# Patient Record
Sex: Male | Born: 1965 | Race: Black or African American | Hispanic: No | Marital: Single | Smoking: Never smoker
Health system: Southern US, Community
[De-identification: ages and names within clinical notes are randomized; demographics above are authoritative.]

## PROBLEM LIST (undated history)

## (undated) DIAGNOSIS — N529 Male erectile dysfunction, unspecified: Secondary | ICD-10-CM

## (undated) DIAGNOSIS — F32A Depression, unspecified: Secondary | ICD-10-CM

## (undated) DIAGNOSIS — G373 Acute transverse myelitis in demyelinating disease of central nervous system: Secondary | ICD-10-CM

## (undated) DIAGNOSIS — K592 Neurogenic bowel, not elsewhere classified: Secondary | ICD-10-CM

## (undated) DIAGNOSIS — F419 Anxiety disorder, unspecified: Secondary | ICD-10-CM

## (undated) DIAGNOSIS — K219 Gastro-esophageal reflux disease without esophagitis: Secondary | ICD-10-CM

## (undated) DIAGNOSIS — N319 Neuromuscular dysfunction of bladder, unspecified: Secondary | ICD-10-CM

## (undated) DIAGNOSIS — I10 Essential (primary) hypertension: Secondary | ICD-10-CM

## (undated) DIAGNOSIS — J45909 Unspecified asthma, uncomplicated: Secondary | ICD-10-CM

## (undated) DIAGNOSIS — G8220 Paraplegia, unspecified: Secondary | ICD-10-CM

## (undated) HISTORY — DX: Anxiety disorder, unspecified: F41.9

## (undated) HISTORY — DX: Essential (primary) hypertension: I10

---

## 2020-03-30 DIAGNOSIS — Z87828 Personal history of other (healed) physical injury and trauma: Secondary | ICD-10-CM

## 2020-03-30 HISTORY — DX: Personal history of other (healed) physical injury and trauma: Z87.828

## 2020-11-07 ENCOUNTER — Encounter: Payer: Self-pay | Admitting: Physical Medicine and Rehabilitation

## 2020-12-08 ENCOUNTER — Other Ambulatory Visit: Payer: Self-pay

## 2020-12-08 ENCOUNTER — Encounter: Payer: Self-pay | Admitting: Physical Medicine and Rehabilitation

## 2020-12-08 ENCOUNTER — Encounter
Payer: BC Managed Care – PPO | Attending: Physical Medicine and Rehabilitation | Admitting: Physical Medicine and Rehabilitation

## 2020-12-08 VITALS — BP 159/90 | HR 63 | Temp 99.0°F | Ht 70.0 in | Wt 176.0 lb

## 2020-12-08 DIAGNOSIS — G8929 Other chronic pain: Secondary | ICD-10-CM | POA: Insufficient documentation

## 2020-12-08 DIAGNOSIS — N319 Neuromuscular dysfunction of bladder, unspecified: Secondary | ICD-10-CM | POA: Insufficient documentation

## 2020-12-08 DIAGNOSIS — Z79891 Long term (current) use of opiate analgesic: Secondary | ICD-10-CM | POA: Diagnosis present

## 2020-12-08 DIAGNOSIS — G8220 Paraplegia, unspecified: Secondary | ICD-10-CM | POA: Diagnosis present

## 2020-12-08 DIAGNOSIS — Z5181 Encounter for therapeutic drug level monitoring: Secondary | ICD-10-CM | POA: Diagnosis present

## 2020-12-08 DIAGNOSIS — G894 Chronic pain syndrome: Secondary | ICD-10-CM | POA: Insufficient documentation

## 2020-12-08 DIAGNOSIS — Z993 Dependence on wheelchair: Secondary | ICD-10-CM | POA: Diagnosis present

## 2020-12-08 DIAGNOSIS — G373 Acute transverse myelitis in demyelinating disease of central nervous system: Secondary | ICD-10-CM | POA: Diagnosis present

## 2020-12-08 DIAGNOSIS — R252 Cramp and spasm: Secondary | ICD-10-CM | POA: Insufficient documentation

## 2020-12-08 DIAGNOSIS — M546 Pain in thoracic spine: Secondary | ICD-10-CM | POA: Diagnosis present

## 2020-12-08 DIAGNOSIS — K592 Neurogenic bowel, not elsewhere classified: Secondary | ICD-10-CM | POA: Insufficient documentation

## 2020-12-08 MED ORDER — TRAMADOL HCL 50 MG PO TABS: 50.0000 mg | ORAL_TABLET | Freq: Four times a day (QID) | ORAL | 5 refills | Status: DC | PRN

## 2020-12-08 MED ORDER — FLUOXETINE HCL 10 MG PO CAPS: 10.0000 mg | ORAL_CAPSULE | Freq: Every day | ORAL | 5 refills | Status: DC

## 2020-12-08 NOTE — Progress Notes (Signed)
Subjective:    Patient ID: Maurice Cruz, male    DOB: 07/08/1966, 55 y.o.   MRN: [id number]  HPI  Pt is a 55 yr old R handed male with hx of HTN who developed transverse myelitis d'xd in 7/21. With neurogenic bowel and bladder and spasticity- here for evaluation.  S/P steroid IV and IVIG- no plasmapheresis.    Does OK overall.  Just trying to get back mobile again.  The issue right now.  60-70% back to normal in LLE and RLE is 10% back.   Has had therapy- doing outpt PT at [facility], [place]   Has to pull RLE forward to walk- could walk ~ 30 ft max with RW- father is with him when walks.   First fall this past Monday- slid out of bed trying to get into w/c. RLE not stable to really put weight on. No pain except sore back-   Pain- a little soreness  In back-  Later on that evening after therapy- but not right after therapy.  Don't have SCI specialist, not sure if has Neuro rehab specialist- haven't done any e stim.   Pain can become excruciating if sits up for more than 60-90 minutes- Has more Oxy left than Tramadol.   Goes back to PCP Wednesday for pain meds.    Had Baclofen and Lexpro- 10 mg each- got light headed and nauseated.   Taking Baclofen 5 mg QHS- working OK.    Mood is a problem- in pt's estimation.  Due to putting pressure on him financially- hasn't been paid from disability since December.    Self cathing-  q5 hours-   BM- can go every few days- Saturday- and then again Thursday.   Has hemorrhoids as well. And has constipation Takes Lactulose 30 G 1x/day Fiber tabs Colace- 300 mg daily Miralax- doesn't help- takes prn  Dulcolax tabs help him go when needs to.  Soft- has form- has to sit there forever.   Social Hx:  Moved to  parents in [place], [place] - tri-level- split level- stair lift installed on both levels.  W/C- got from [employer]- last year. - Ki mobility Cushion made by [employer] mobility- custom made.  Has RW- can use some- in  rehab  Urologist Dr Shawn Route- [facility]- likes her.    Pain Inventory Average Pain 3 Pain Right Now 3 My pain is intermittent and aching  In the last 24 hours, has pain interfered with the following? General activity 0 Relation with others 0 Enjoyment of life 0 What TIME of day is your pain at its worst? varies Sleep (in general) Poor  Pain is worse with: unsure Pain improves with: medication Relief from Meds: 8  ability to climb steps?  no do you drive?  no use a wheelchair transfers alone  employed # of hrs/week 40 disabled: date disabled on short tern disablity since accident  bladder control problems bowel control problems numbness anxiety  New patient  New patient    Family History  Problem Relation Age of Onset  . Cancer Maternal Grandmother    Social History   Socioeconomic History  . Marital status: Single    Spouse name: Not on file  . Number of children: Not on file  . Years of education: Not on file  . Highest education level: Not on file  Occupational History  . Not on file  Tobacco Use  . Smoking status: Never Smoker  . Smokeless tobacco: Not on  file  Substance and Sexual Activity  . Alcohol use: Not Currently  . Drug use: Not on file  . Sexual activity: Not on file  Other Topics Concern  . Not on file  Social History Narrative  . Not on file   Social Determinants of Health   Financial Resource Strain: Not on file  Food Insecurity: Not on file  Transportation Needs: Not on file  Physical Activity: Not on file  Stress: Not on file  Social Connections: Not on file    Past Medical History:  Diagnosis Date  . Anxiety   . Hx of spinal cord injury 03/2020  . Hypertension    BP (!) 159/90   Pulse 63   Temp 99 F (37.2 C)   Ht '5\' 10"'$  (1.778 m)   Wt 176 lb (79.8 kg)   SpO2 98%   BMI 25.25 kg/m   Opioid Risk Score:   Fall Risk Score:  `1  Depression screen PHQ 2/9  Depression screen PHQ 2/9 12/08/2020  Decreased  Interest 3  Down, Depressed, Hopeless 3  PHQ - 2 Score 6  Altered sleeping 1  Tired, decreased energy 1  Change in appetite 2  Feeling bad or failure about yourself  3  Trouble concentrating 0  Moving slowly or fidgety/restless 0  Suicidal thoughts 0  PHQ-9 Score 13  Difficult doing work/chores Very difficult    Review of Systems  Gastrointestinal: Positive for nausea.  Neurological: Positive for numbness.  All other systems reviewed and are negative.      Objective:   Physical Exam  Awake, alert, appropriate, accompanied by father, in manual w/c, hasn't done pressure relief, NAD MS: UEs 5/5 in biceps, triceps, WE, grip and finger abd LEs: RLE- HF 1/5, KE 2/5, DF and PF 2/5 LLE- HF 4/5, KE 4/5, and DF/PF 5/5  Neuro: Decreased sensation at T10 and down- intact above T10 B/L MAS- of 2 in R knee/hip as well as R ankle MAS of 1 in LLE in same joints No clonus B/L      Assessment & Plan:     Pt is a 55 yr old R handed male with hx of HTN who developed transverse myelitis d'xd in 7/21. With neurogenic bowel and bladder and spasticity- here for evaluation.  S/P steroid IV and IVIG- no plasmapheresis.    1.  Prozac- 10 mg daily x 2 weeks, then 20 mg daily for mood.     2. Pt SHOULD be on disability- at this time- for at least another 12 months. He is paraplegic, and needs to focus on healing- and recovering from such a significant/serious injury in his life- has transverse myelitis, which is  a severe disability, defined by the American Disability Act- and not only is getting therapy mmultiple days, per week, but can only tolerate sitting in his w/c for 60-90 minutes a time, before having to get out due to pain and fatigue. Without being in the w/c, he is not able to get from point A to point B in any significant fashion- he needs the w/c to "walk" for him- The back pain associated with transverse myelitis is well documented, because many level of the Spinal cord are  affected and cause muscular weakness in the back as well as the LE's. So I expect, ONCE pt has finished PT and OT- and been able to progress, he MIGHT  Be able to return to work in another 12 months or so.  As a SCI PM&R physician,  I see this frequently, and this course is well documented for many patients.    3. Opiate contract and oral drug screen due to SCI.   4. Is going to meet Neurologist who specializes in [place], May 9th- keep this appointment.   5. Once drug screen is back, will prescribe Oxy as needed   6. Tramadol 50 mg q6 hours as needed-  If that's not quite enough, 100 mg 2x/day- as needed. Usually 7 days for first Rx- but hopefully will bypass because has had before.   7. Pretreat with pain meds before therapy- ~ 1 hours prior.   8.  D/c Miralax- Senokot -  1-4 tabs/day- take SENNA, not senokot-S- goal going every other day.  Might want to decrease  Colace ot 2 tabs/day. Suggest decreasing Fiber to 2 tabs/day.   9. Con't Magneisum - 1 250 mg daily-  If other options don't work, can increase Magnesium- max dose is 1200 mg/day.   10.  Baclofen 5-10 mg nightly for spasticity will NOT give daytime Baclofen right now due to sedation, constipation as well as making RLE weaker due to getting rid of tone.   11. Don't make meds changes on same day-  Do one change, then 2 days later.   12. Do pressure relief in w/c every 15-20 minutes- discussed mmHg 20 to keep capillaries open; 30 to close them by sitting, so needs to do q15-20 minutes to allow tissue to heal.   13. PT referral so can see an Neuro Rehab/SCI specialist here at [facility]. Will also ask for Estim as well as eval for AFO vs KAFO for R leg.   14. F/U in ~8 weeks- double appointment for now .   I spent a total of 1 hour on appointment- as detailed above.

## 2020-12-08 NOTE — Addendum Note (Signed)
Addended by: Jasmine December T on: 12/08/2020 12:06 PM   Modules accepted: Orders

## 2020-12-08 NOTE — Patient Instructions (Addendum)
Pt is a 55 yr old R handed male with hx of HTN who developed transverse myelitis d'xd in 7/21. With neurogenic bowel and bladder and spasticity- here for evaluation.  S/P steroid IV and IVIG- no plasmapheresis.    1.  Prozac- 10 mg daily x 2 weeks, then 20 mg daily for mood.     2. Pt SHOULD be on disability- at this time- for at least another 12 months. He is paraplegic, and needs to focus on healing- and recovering from such a significant/serious injury in his life- has transverse myelitis, which is  a severe disability, defined by the American Disability Act- and not only is getting therapy mmultiple days, per week, but can only tolerate sitting in his w/c for 60-90 minutes a time, before having to get out due to pain and fatigue. Without being in the w/c, he is not able to get from point A to point B in any significant fashion- he needs the w/c to "walk" for him- The back pain associated with transverse myelitis is well documented, because many level of the Spinal cord are affected and cause muscular weakness in the back as well as the LE's. So I expect, ONCE pt has finished PT and OT- and been able to progress, he MIGHT  Be able to return to work in another 12 months or so.  As a SCI PM&R physician, I see this frequently, and this course is well documented for many patients.    3. Opiate contract and oral drug screen due to SCI.   4. Is going to meet Neurologist who specializes in [place], May 9th- keep this appointment.   5. Once drug screen is back, will prescribe Oxy as needed   6. Tramadol 50 mg q6 hours as needed-  If that's not quite enough, 100 mg 2x/day- as needed. Usually 7 days for first Rx- but hopefully will bypass because has had before.   7. Pretreat with pain meds before therapy- ~ 1 hours prior.   8.  D/c Miralax- Senokot -  1-4 tabs/day- take SENNA, not senokot-S- goal going every other day.  Might want to decrease  Colace ot 2 tabs/day. Suggest decreasing Fiber to 2 tabs/day.    9. Con't Magneisum - 1 250 mg daily-  If other options don't work, can increase Magnesium- max dose is 1200 mg/day.   10.  Baclofen 5-10 mg nightly for spasticity will NOT give daytime Baclofen right now due to sedation, constipation as well as making RLE weaker due to getting rid of tone.   11. Don't make meds changes on same day-  Do one change, then 2 days later.   12. Do pressure relief in w/c every 15-20 minutes- discussed mmHg 20 to keep capillaries open; 30 to close them by sitting, so needs to do q15-20 minutes to allow tissue to heal.   13. PT referral so can see an Neuro Rehab/SCI specialist here at [facility]. Will also ask for Estim as well as eval for AFO vs KAFO for R leg.    14. F/U in ~8 weeks- double appointment for now .

## 2020-12-08 NOTE — Addendum Note (Signed)
Addended by: Jasmine December T on: 12/08/2020 12:04 PM   Modules accepted: Orders

## 2020-12-15 LAB — DRUG TOX MONITOR 1 W/CONF, ORAL FLD

## 2020-12-15 LAB — DRUG TOX ALC METAB W/CON, ORAL FLD: Alcohol Metabolite: NEGATIVE ng/mL (ref <25)

## 2020-12-18 ENCOUNTER — Other Ambulatory Visit: Payer: Self-pay

## 2020-12-18 ENCOUNTER — Ambulatory Visit: Payer: BC Managed Care – PPO | Attending: Physical Medicine and Rehabilitation

## 2020-12-18 ENCOUNTER — Telehealth: Payer: Self-pay | Admitting: *Deleted

## 2020-12-18 DIAGNOSIS — M6281 Muscle weakness (generalized): Secondary | ICD-10-CM | POA: Insufficient documentation

## 2020-12-18 DIAGNOSIS — R2681 Unsteadiness on feet: Secondary | ICD-10-CM | POA: Insufficient documentation

## 2020-12-18 DIAGNOSIS — G373 Acute transverse myelitis in demyelinating disease of central nervous system: Secondary | ICD-10-CM | POA: Diagnosis present

## 2020-12-18 DIAGNOSIS — R2689 Other abnormalities of gait and mobility: Secondary | ICD-10-CM | POA: Insufficient documentation

## 2020-12-18 NOTE — Telephone Encounter (Signed)
Oral swab drug screen on 12/08/20 was completely negative. Maurice Cruz reported he took oxycodone last before the test on 12/07/20, therefore metabolites should have been present. Unclear if completely negative due to failed swab or he had no medication/metabolite in his system. Urine test is always the gold standard.

## 2020-12-18 NOTE — Patient Instructions (Signed)
Access Code: [email or web address] URL: [email or web address] Date: 12/18/2020 Prepared by: Sharlynn Oliphant  Exercises Supine Heel Slide with Strap - 2 x daily - 7 x weekly - 3 sets - 10 reps Supine Bridge - 2 x daily - 7 x weekly - 3 sets - 10 reps Bent Knee Fallouts - 2 x daily - 7 x weekly - 3 sets - 10 reps Hook Lying Single Knee to Chest Stretch with Towel - 2 x daily - 7 x weekly - 3 sets - 10 reps

## 2020-12-19 NOTE — Therapy (Signed)
[facility] [address] [place], [place], [address] Phone: [phone]   Fax:  [phone]  Physical Therapy Evaluation  Patient Details  Name: Maurice Cruz MRN: [id number] Date of Birth: [birth year] Referring Provider (PT): Dr Courtney Heys   Encounter Date: 12/18/2020   PT End of Session - 12/19/20 1321    Visit Number 1    Number of Visits 17    Date for PT Re-Evaluation 02/13/21    Authorization Type BCBS    PT Start Time [id number]    PT Stop Time 1230    PT Time Calculation (min) 45 min    Equipment Utilized During Treatment Gait belt    Activity Tolerance Patient tolerated treatment well;No increased pain;Patient limited by fatigue    Behavior During Therapy [facility] for tasks assessed/performed           Past Medical History:  Diagnosis Date   Anxiety    Hx of spinal cord injury 03/2020   Hypertension     History reviewed. No pertinent surgical history.  There were no vitals filed for this visit.    Subjective Assessment - 12/18/20 1150    Subjective Began to notice symptoms in 11/21, had 2 week hospital stay followed by 1 mo. at [facility] in [place].  Was receiving OPPT at [facility] for LE strengthening however referring MD recommended this clinic, has since moved in with parents for physical as well as financial assistance and has obtained and installed a chair lift, denies LE pain but has a hx of low back pain worse with prolonged sitting    How long can you sit comfortably? 30 min    How long can you stand comfortably? <5 min    How long can you walk comfortably? <5 min    Currently in Pain? No/denies             12/18/20 0001  Assessment  Medical Diagnosis MS  Referring Provider (PT) Dr Courtney Heys  Onset Date/Surgical Date 08/20/21  Next MD Visit 02/02/21  Prior Therapy OPPT  Precautions  Precautions Fall  Balance Screen  Has the patient fallen in the past 6 months Yes  How many  times? 1  Has the patient had a decrease in activity level because of a fear of falling?  Yes  Is the patient reluctant to leave their home because of a fear of falling?  Yes  [place] Private residence  Living Arrangements Parent  Available Help at Discharge Family  Type of [place] Access Level entry  [place] Two level  Alternate Level Stairs-Number of Steps 16  Alternate Level Stairs-Rails Right  [place] Other (comment)  Additional Comments stair lift  Prior Function  Level of Independence Independent  Vocation On disability (Attemting to obtain disability)  Sensation  Light Touch Appears Intact  Strength  Right/Left Hip Right  Right Hip Flexion 2/5  Right Hip Extension 3/5  Right Hip ABduction 2+/5  Right Knee Flexion 2+/5  Right Knee Extension 3-/5  Right Ankle Dorsiflexion 3/5  Right Ankle Plantar Flexion 3/5  Bed Mobility  Bed Mobility Sit to Supine;Supine to Sit (uses LLE to scoop RLE)  Transfers  Transfers Sit to Supine  Comments scoops RLE onto bed with LLE                 Objective measurements completed on examination: See above findings.  PT Education - 12/18/20 1231    Education Details [id number]    Person(s) Educated Patient    Methods Explanation;Demonstration;Tactile cues;Handout            PT Short Term Goals - 12/18/20 1334      PT SHORT TERM GOAL #1   Title Patient to demo initial HEP back to PT w/o need of cuing    Baseline initial HEP issued today    Time 4    Period Weeks    Status New    Target Date 01/16/21      PT SHORT TERM GOAL #2   Title patine to able to demo stand pivot and STS transfers with S    Baseline able to perform stand pivot and STS transfers with CGA    Time 4    Period Weeks    Status New    Target Date 01/16/21      PT SHORT TERM GOAL #3   Title patient able to ambulate 344f aross level surfaces using RW and light CGA     Baseline 11105fwith RW across level surfaces with CGA and WC follow    Time 4    Period Weeks    Status New    Target Date 01/16/21      PT SHORT TERM GOAL #4   Title patient to demo I in bed mobility with focus on control of RLE    Baseline requires CGA to transition from sit/supine needing to scoop RLE onto bed using LLE    Time 4    Period Weeks    Status New    Target Date 01/16/21      PT SHORT TERM GOAL #5   Title Assess BERG and set appropriate goal    Baseline UTA due to time constraint    Time 4    Period Weeks    Status New    Target Date 01/16/21             PT Long Term Goals - 12/19/20 1353      PT LONG TERM GOAL #1   Title Assess progress towards BERG    Baseline UTA    Time 8    Period Weeks    Status New    Target Date 02/13/21      PT LONG TERM GOAL #2   Title Ambulate 50044fith LRAD across level and unlevel ground with S    Baseline 115f35fth RW across level ground with CGA    Time 8    Period Weeks    Status New    Target Date 02/13/21      PT LONG TERM GOAL #3   Title improve RLE strength throughout from 2 to 3/5 to 3+/5 to allow participatin in functional tasks    Baseline 2 to 3/5 RLE sterngth    Time 8    Period Weeks    Status New    Target Date 02/13/21      PT LONG TERM GOAL #4   Title Patient to demo I in all transfers    Baseline CGA stand pivot and STS transfers    Time 8    Period Weeks    Status New    Target Date 02/13/21      PT LONG TERM GOAL #5   Title patient will ambulate room to room distances with LRAd under S    Baseline 115ft57fh RW and CGA    Time 8  Period Weeks    Status New    Target Date 02/13/21                  Plan - 12/19/20 1323    Clinical Impression Statement patient presents with decresaed functional mobility most profound in RLE weakness following a dx of transverse myelitis, he requires assist for transfers and ambulation, once standing he is able to ambulate with RW and light  CGA, sensation to light touch is intact but weakness noted with R hipflexion and knee extension which markedly impairs his ambulation ability.  He is a good candidat for skilled PT at this time with a good potential to improve his mobility and independence.  Patient educated on Eval findings, POC and prognosis and is in agreement    Personal Factors and Comorbidities Comorbidity 1    Comorbidities disease process    Examination-Activity Limitations Bed Mobility;Locomotion Level;Transfers;Continence;Toileting    Examination-Participation Restrictions Driving    Stability/Clinical Decision Making Stable/Uncomplicated    Clinical Decision Making Low    Rehab Potential Good    PT Frequency 2x / week    PT Duration 8 weeks    PT Treatment/Interventions ADLs/Self Care Home Management;Aquatic Therapy;Electrical Stimulation;DME Instruction;Gait training;Stair training;Functional mobility training;Therapeutic activities;Therapeutic exercise;Balance training;Neuromuscular re-education;Manual techniques;Wheelchair mobility training;Orthotic Fit/Training;Patient/family education    PT Next Visit Plan f/u with HEP, gait, balance and transfer training    PT Home Exercise Plan [id number]    Recommended Other Services OT pending    Consulted and Agree with Plan of Care Patient           Patient will benefit from skilled therapeutic intervention in order to improve the following deficits and impairments:  Abnormal gait,Decreased range of motion,Difficulty walking,Decreased endurance,Decreased activity tolerance,Decreased balance,Decreased mobility,Decreased strength  Visit Diagnosis: Other abnormalities of gait and mobility  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HCC)  Muscle weakness (generalized)     Problem List Patient Active Problem List   Diagnosis Date Noted   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[place]) 12/08/2020   Neurogenic bladder 12/08/2020   Neurogenic bowel 12/08/2020   Paraplegia following spinal cord injury ([place])  12/08/2020   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 [place], [place], [address] Phone: [phone]   Fax:  [phone]  Name: Maurice Cruz MRN: [id number] Date of Birth: [birth year]

## 2020-12-25 ENCOUNTER — Ambulatory Visit: Payer: BC Managed Care – PPO

## 2020-12-25 ENCOUNTER — Other Ambulatory Visit: Payer: Self-pay

## 2020-12-25 DIAGNOSIS — R2689 Other abnormalities of gait and mobility: Secondary | ICD-10-CM | POA: Diagnosis not present

## 2020-12-25 DIAGNOSIS — M6281 Muscle weakness (generalized): Secondary | ICD-10-CM

## 2020-12-25 DIAGNOSIS — R2681 Unsteadiness on feet: Secondary | ICD-10-CM

## 2020-12-25 NOTE — Therapy (Signed)
[place] [address] Maurice Cruz [place], [place], [address] Phone: [phone]   Fax:  [phone]  Physical Therapy Treatment  Patient Details  Name: Maurice Cruz MRN: [id number] Date of Birth: Dec 29, 1965 Referring Provider (PT): Dr Maurice Cruz   Encounter Date: 12/25/2020   PT End of Session - 12/25/20 1407    Visit Number 2    Number of Visits 17    Date for PT Re-Evaluation 02/13/21    Authorization Type BCBS    PT Start Time 1315    PT Stop Time 1400    PT Time Calculation (min) 45 min    Equipment Utilized During Treatment Gait belt    Activity Tolerance Patient tolerated treatment well;No increased pain;Patient limited by fatigue    Behavior During Therapy [facility] for tasks assessed/performed           Past Medical History:  Diagnosis Date  . Anxiety   . Hx of spinal cord injury 03/2020  . Hypertension     History reviewed. No pertinent surgical history.  There were no vitals filed for this visit.   Subjective Assessment - 12/25/20 1405    Subjective No falls or med changes to report, has RW today    Pertinent History Began to notice symptoms in 11/21, had 2 week hospital stay followed by 1 mo. at [facility] in [place].  Was receiving OPPT at [facility] for LE strengthening however referring MD recommended this clinic, has since moved in with parents for physical as well as financial assistance and has obtained and installed a chair lift, denies LE pain but has a hx of low back pain worse with prolonged sitting    How long can you sit comfortably? 30 min    How long can you stand comfortably? <5 min    How long can you walk comfortably? <5 min    Currently in Pain? No/denies                             [facility] Adult PT Treatment/Exercise - 12/25/20 0001      Bed Mobility   Bed Mobility Supine to Sit;Sit to Supine    Supine to Sit Set up assist;Contact Guard/Touching assist    Sit to  Supine Set up assist;Contact Guard/Touching assist      Transfers   Transfers Sit to Stand    Comments 5x with UE support, 5x with OH reach      Ambulation/Gait   Ambulation/Gait Yes    Ambulation/Gait Assistance 4: Min guard;4: Min assist    Ambulation/Gait Assistance Details facilitated L WS to clear R foot    Ambulation Distance (Feet) 115 Feet    Assistive device Rolling walker    Gait Pattern Step-through pattern    Ambulation Surface Level;Indoor    Gait Comments adjusted walker height to patient, 2x172f ambulation      Lumbar Exercises: Seated   Other Seated Lumbar Exercises core exercises of chest press, OH flexion and chops, 2x10 with 2.2# ball      Lumbar Exercises: Supine   Bridge 10 reps    Other Supine Lumbar Exercises DKTC over physioball, 10x, LTR 10x ea.                    PT Short Term Goals - 12/18/20 1334      PT SHORT TERM GOAL #1   Title Patient to demo initial HEP back to  PT w/o need of cuing    Baseline initial HEP issued today    Time 4    Period Weeks    Status New    Target Date 01/16/21      PT SHORT TERM GOAL #2   Title patine to able to demo stand pivot and STS transfers with S    Baseline able to perform stand pivot and STS transfers with CGA    Time 4    Period Weeks    Status New    Target Date 01/16/21      PT SHORT TERM GOAL #3   Title patient able to ambulate 337f aross level surfaces using RW and light CGA    Baseline 1160fwith RW across level surfaces with CGA and WC follow    Time 4    Period Weeks    Status New    Target Date 01/16/21      PT SHORT TERM GOAL #4   Title patient to demo I in bed mobility with focus on control of RLE    Baseline requires CGA to transition from sit/supine needing to scoop RLE onto bed using LLE    Time 4    Period Weeks    Status New    Target Date 01/16/21      PT SHORT TERM GOAL #5   Title Assess BERG and set appropriate goal    Baseline UTA due to time constraint    Time  4    Period Weeks    Status New    Target Date 01/16/21             PT Long Term Goals - 12/19/20 1353      PT LONG TERM GOAL #1   Title Assess progress towards BERG    Baseline UTA    Time 8    Period Weeks    Status New    Target Date 02/13/21      PT LONG TERM GOAL #2   Title Ambulate 50055fith LRAD across level and unlevel ground with S    Baseline 115f52fth RW across level ground with CGA    Time 8    Period Weeks    Status New    Target Date 02/13/21      PT LONG TERM GOAL #3   Title improve RLE strength throughout from 2 to 3/5 to 3+/5 to allow participatin in functional tasks    Baseline 2 to 3/5 RLE sterngth    Time 8    Period Weeks    Status New    Target Date 02/13/21      PT LONG TERM GOAL #4   Title Patient to demo I in all transfers    Baseline CGA stand pivot and STS transfers    Time 8    Period Weeks    Status New    Target Date 02/13/21      PT LONG TERM GOAL #5   Title patient will ambulate room to room distances with LRAd under S    Baseline 115ft63fh RW and CGA    Time 8    Period Weeks    Status New    Target Date 02/13/21                 Plan - 12/25/20 1408    Clinical Impression Statement Focus of todays session was review of HEP and bed mobility, gait and transfer training, able to perform STS transfers  with close S and ambulate 267f with RW and CGA to faciltate L WS and R swing through, continues to have difficulty and discomfort flexing R knee due to spasm/tone    Personal Factors and Comorbidities Comorbidity 1    Comorbidities disease process    Examination-Activity Limitations Bed Mobility;Locomotion Level;Transfers;Continence;Toileting    Examination-Participation Restrictions Driving    Stability/Clinical Decision Making Stable/Uncomplicated    Rehab Potential Good    PT Frequency 2x / week    PT Duration 8 weeks    PT Treatment/Interventions ADLs/Self Care Home Management;Aquatic Therapy;Electrical  Stimulation;DME Instruction;Gait training;Stair training;Functional mobility training;Therapeutic activities;Therapeutic exercise;Balance training;Neuromuscular re-education;Manual techniques;Wheelchair mobility training;Orthotic Fit/Training;Patient/family education    PT Next Visit Plan gait, balance and transfer training, add tasks in // bars using compliant surfaces, extend gait distance    PT Home Exercise Plan [id number]   Consulted and Agree with Plan of Care Patient           Patient will benefit from skilled therapeutic intervention in order to improve the following deficits and impairments:  Abnormal gait,Decreased range of motion,Difficulty walking,Decreased endurance,Decreased activity tolerance,Decreased balance,Decreased mobility,Decreased strength  Visit Diagnosis: Unsteadiness on feet  Muscle weakness (generalized)     Problem List Patient Active Problem List   Diagnosis Date Noted  . 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 Neurogenic bladder 12/08/2020  . Neurogenic bowel 12/08/2020  . Paraplegia following spinal cord injury ([place] 12/08/2020  . Wheelchair dependence 12/08/2020  . Spasticity 12/08/2020  . Chronic bilateral thoracic back pain 12/08/2020    JLanice Shirts3/28/2022, 2:13 PM  [place] [address]Phone: [phone]  Fax:  [phone] Name: MRAMONA CLARIDAMRN: [id number] of Birth: 103-03-1966

## 2020-12-29 ENCOUNTER — Other Ambulatory Visit: Payer: Self-pay

## 2020-12-29 ENCOUNTER — Ambulatory Visit: Payer: BC Managed Care – PPO | Attending: Physical Medicine and Rehabilitation

## 2020-12-29 DIAGNOSIS — G373 Acute transverse myelitis in demyelinating disease of central nervous system: Secondary | ICD-10-CM | POA: Diagnosis present

## 2020-12-29 DIAGNOSIS — R2689 Other abnormalities of gait and mobility: Secondary | ICD-10-CM

## 2020-12-29 DIAGNOSIS — M6281 Muscle weakness (generalized): Secondary | ICD-10-CM

## 2020-12-29 DIAGNOSIS — R2681 Unsteadiness on feet: Secondary | ICD-10-CM | POA: Diagnosis present

## 2020-12-29 NOTE — Therapy (Signed)
[facility] [address] [place], [place], [address] Phone: [phone]   Fax:  [phone]  Physical Therapy Treatment  Patient Details  Name: Maurice Cruz MRN: [id number] Date of Birth: [birth year] Referring Provider (PT): Dr Courtney Heys   Encounter Date: 12/29/2020   PT End of Session - 12/29/20 1233    Visit Number 3    Number of Visits 17    Date for PT Re-Evaluation 02/13/21    Authorization Type BCBS    PT Start Time 1232    PT Stop Time 1312    PT Time Calculation (min) 40 min    Equipment Utilized During Treatment Gait belt    Activity Tolerance Patient tolerated treatment well;No increased pain;Patient limited by fatigue    Behavior During Therapy [facility] for tasks assessed/performed           Past Medical History:  Diagnosis Date  . Anxiety   . Hx of spinal cord injury 03/2020  . Hypertension     History reviewed. No pertinent surgical history.  There were no vitals filed for this visit.   Subjective Assessment - 12/29/20 1233    Subjective No falls or med changes to report, has RW today. Pt had a procedure done to remove something on colon on Wednesday and has note that it is safe to resume therapy.    Pertinent History Began to notice symptoms in 11/21, had 2 week hospital stay followed by 1 mo. at [facility] in [place].  Was receiving OPPT at [facility] for LE strengthening however referring MD recommended this clinic, has since moved in with parents for physical as well as financial assistance and has obtained and installed a chair lift, denies LE pain but has a hx of low back pain worse with prolonged sitting    How long can you sit comfortably? 30 min    How long can you stand comfortably? <5 min    How long can you walk comfortably? <5 min    Currently in Pain? No/denies                             [facility] Adult PT Treatment/Exercise - 12/29/20 1235      Transfers    Transfers Sit to Stand;Stand to Sit    Sit to Stand 5: Supervision    Sit to Stand Details Verbal cues for technique    Sit to Stand Details (indicate cue type and reason) Pt initially unsteady when rising if has been sitting awhile but improves with consecutive performance.    Stand to Sit 5: Supervision    Stand to Sit Details (indicate cue type and reason) Verbal cues for technique    Stand to Sit Details Pt cued to control descent      Ambulation/Gait   Ambulation/Gait Yes    Ambulation/Gait Assistance 4: Min guard    Ambulation/Gait Assistance Details PT facilitated at pelvis for weight shift and to try to get some anterior pelvic rotation on the right.    Ambulation Distance (Feet) 230 Feet    Assistive device Rolling walker    Gait Pattern Step-through pattern;Decreased hip/knee flexion - right;Decreased step length - right;Decreased step length - left;Decreased dorsiflexion - right;Poor foot clearance - right    Ambulation Surface Level;Indoor      Standardized Balance Assessment   Standardized Balance Assessment Oceanographer Test      [employer] Test   Sit  to Stand Able to stand  independently using hands    Standing Unsupported Able to stand safely 2 minutes    Sitting with Back Unsupported but Feet Supported on Floor or Stool Able to sit safely and securely 2 minutes    Stand to Sit Controls descent by using hands    Transfers Needs two people to assist of supervise to be safe   only able to do lateral scoot   Standing Unsupported with Eyes Closed Able to stand 10 seconds with supervision    Standing Ubsupported with Feet Together Able to place feet together independently and stand for 1 minute with supervision    From Standing, Reach Forward with Outstretched Arm Can reach forward >5 cm safely (2")    From Standing Position, Pick up Object from Floor Unable to try/needs assist to keep balance    From Standing Position, Turn to Look Behind Over each Shoulder Looks behind  one side only/other side shows less weight shift    Turn 360 Degrees Needs assistance while turning    Standing Unsupported, Alternately Place Feet on Step/Stool Needs assistance to keep from falling or unable to try   unable to lift right foot to touch step   Standing Unsupported, One Foot in [employer] balance while stepping or standing    Standing on One Leg Unable to try or needs assist to prevent fall    Total Score 25      Neuro Re-ed    Neuro Re-ed Details  Tall kneeling on mat with bench for UE support: weight shifting side to side x 10 with cues to bump right hip in to PT's hip, tall kneeling with alternating shoulder flexion x 10, mini-squats x 5 with tactile cues at hips. Pt was cued to try to keep tummy tight to support back some. Did have some discomfort in low back and could not get completely erect.                    PT Short Term Goals - 12/29/20 1548      PT SHORT TERM GOAL #1   Title Patient to demo initial HEP back to PT w/o need of cuing    Baseline initial HEP issued today    Time 4    Period Weeks    Status New    Target Date 01/16/21      PT SHORT TERM GOAL #2   Title patine to able to demo stand pivot and STS transfers with S    Baseline able to perform stand pivot and STS transfers with CGA    Time 4    Period Weeks    Status New    Target Date 01/16/21      PT SHORT TERM GOAL #3   Title patient able to ambulate 334f aross level surfaces using RW and light CGA    Baseline 1182fwith RW across level surfaces with CGA and WC follow    Time 4    Period Weeks    Status New    Target Date 01/16/21      PT SHORT TERM GOAL #4   Title patient to demo I in bed mobility with focus on control of RLE    Baseline requires CGA to transition from sit/supine needing to scoop RLE onto bed using LLE    Time 4    Period Weeks    Status New    Target Date 01/16/21      PT  SHORT TERM GOAL #5   Title Assess BERG and set appropriate goal    Baseline Berg  performed and LTG written    Time 4    Period Weeks    Status Achieved    Target Date 01/16/21             PT Long Term Goals - 12/29/20 1548      PT LONG TERM GOAL #1   Title Pt will increase Berg score from 25/56 to >30/56 for improved balance and decreased fall risk.    Baseline 12/29/20 25/56    Time 8    Period Weeks    Status New      PT LONG TERM GOAL #2   Title Ambulate 548f with LRAD across level and unlevel ground with S    Baseline 1128fwith RW across level ground with CGA    Time 8    Period Weeks    Status New      PT LONG TERM GOAL #3   Title improve RLE strength throughout from 2 to 3/5 to 3+/5 to allow participatin in functional tasks    Baseline 2 to 3/5 RLE sterngth    Time 8    Period Weeks    Status New      PT LONG TERM GOAL #4   Title Patient to demo I in all transfers    Baseline CGA stand pivot and STS transfers    Time 8    Period Weeks    Status New      PT LONG TERM GOAL #5   Title patient will ambulate room to room distances with LRAd under S    Baseline 11532fith RW and CGA    Time 8    Period Weeks    Status New                 Plan - 12/29/20 1549    Clinical Impression Statement Pt has limited step length on right with decreased hip flexion.Stayed fairly consistent throughout gait with PT providing tactile cues at pelvis to try to faciliate some right pelvic rotation. PT performed Berg with score of 25/56 indicating pt is high fall risk. Pt was challenged in tall kneeling position being unable to get completely erect posture showing some weakness in core as well.    Personal Factors and Comorbidities Comorbidity 1    Comorbidities disease process    Examination-Activity Limitations Bed Mobility;Locomotion Level;Transfers;Continence;Toileting    Examination-Participation Restrictions Driving    Stability/Clinical Decision Making Stable/Uncomplicated    Rehab Potential Good    PT Frequency 2x / week    PT Duration 8  weeks    PT Treatment/Interventions ADLs/Self Care Home Management;Aquatic Therapy;Electrical Stimulation;DME Instruction;Gait training;Stair training;Functional mobility training;Therapeutic activities;Therapeutic exercise;Balance training;Neuromuscular re-education;Manual techniques;Wheelchair mobility training;Orthotic Fit/Training;Patient/family education    PT Next Visit Plan gait, balance and transfer training, add tasks in // bars using compliant surfaces, extend gait distance. Continue work in tall kneeling possibly try some quadruped as well. I know pt has limited right hip flexion with gait but I did discuss having him bring sneakers to see if we could try AFO to see if that changed anything? If helped could possibly consider Bioness as well?    PT Home Exercise Plan [id number] Consulted and Agree with Plan of Care Patient           Patient will benefit from skilled therapeutic intervention in order to improve the following deficits and  impairments:  Abnormal gait,Decreased range of motion,Difficulty walking,Decreased endurance,Decreased activity tolerance,Decreased balance,Decreased mobility,Decreased strength  Visit Diagnosis: Other abnormalities of gait and mobility  Muscle weakness (generalized)     Problem List Patient Active Problem List   Diagnosis Date Noted  . 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 Neurogenic bladder 12/08/2020  . Neurogenic bowel 12/08/2020  . Paraplegia following spinal cord injury ([place]) 12/08/2020  . Wheelchair dependence 12/08/2020  . Spasticity 12/08/2020  . Chronic bilateral thoracic back pain 12/08/2020    Electa Sniff, PT, DPT, NCS 12/29/2020, 3:58 PM  [facility] [address] [place], [place], [address] Phone: [phone]   Fax:  [phone]  Name: Maurice Cruz MRN: [id number] Date of Birth: 08-May-1966

## 2021-01-01 ENCOUNTER — Ambulatory Visit: Payer: BC Managed Care – PPO

## 2021-01-01 ENCOUNTER — Other Ambulatory Visit: Payer: Self-pay

## 2021-01-01 DIAGNOSIS — R2689 Other abnormalities of gait and mobility: Secondary | ICD-10-CM | POA: Diagnosis not present

## 2021-01-01 DIAGNOSIS — R2681 Unsteadiness on feet: Secondary | ICD-10-CM

## 2021-01-01 DIAGNOSIS — M6281 Muscle weakness (generalized): Secondary | ICD-10-CM

## 2021-01-01 NOTE — Therapy (Signed)
[facility] [address] [place], [place], [address] Phone: [phone]   Fax:  [phone]  Physical Therapy Treatment  Patient Details  Name: Maurice Cruz MRN: [id number] Date of Birth: July 12, 1966 Referring Provider (PT): Dr Courtney Heys   Encounter Date: 01/01/2021   PT End of Session - 01/01/21 1701    Visit Number 4    Number of Visits 17    Date for PT Re-Evaluation 02/13/21    Authorization Type BCBS    PT Start Time 1230    PT Stop Time 1315    PT Time Calculation (min) 45 min    Equipment Utilized During Treatment Gait belt    Activity Tolerance Patient tolerated treatment well;No increased pain;Patient limited by fatigue    Behavior During Therapy [facility] for tasks assessed/performed           Past Medical History:  Diagnosis Date  . Anxiety   . Hx of spinal cord injury 03/2020  . Hypertension     No past surgical history on file.  There were no vitals filed for this visit.   Subjective Assessment - 01/01/21 1237    Subjective No falls or med changes to report, accompanied by father today.    Pertinent History Began to notice symptoms in 11/21, had 2 week hospital stay followed by 1 mo. at [facility] in [place].  Was receiving OPPT at [facility] for LE strengthening however referring MD recommended this clinic, has since moved in with parents for physical as well as financial assistance and has obtained and installed a chair lift, denies LE pain but has a hx of low back pain worse with prolonged sitting    How long can you sit comfortably? 30 min    How long can you stand comfortably? <5 min    How long can you walk comfortably? <5 min                             [facility] Adult PT Treatment/Exercise - 01/01/21 0001      Transfers   Transfers Sit to Stand    Sit to Stand 5: Supervision    Sit to Stand Details Tactile cues for weight shifting;Verbal cues for sequencing    Sit to  Stand Details (indicate cue type and reason) form improved with reps      Ambulation/Gait   Ambulation/Gait Yes    Ambulation/Gait Assistance 4: Min guard    Ambulation/Gait Assistance Details continued tactile facilitation to promote pelvic rotation as well as weight shifting    Ambulation Distance (Feet) 230 Feet    Assistive device Rolling walker    Gait Pattern Step-through pattern    Ambulation Surface Level;Indoor      Knee/Hip Exercises: Seated   Hamstring Curl Strengthening;Right;1 set;15 reps;Limitations    Hamstring Limitations heel slides over towel                  PT Education - 01/01/21 1659    Education Details added seated heel slides and abduction against yellow band resistance    Person(s) Educated Patient;Parent(s)    Methods Explanation;Demonstration;Tactile cues;Verbal cues;Handout    Comprehension Verbalized understanding;Returned demonstration;Need further instruction            PT Short Term Goals - 12/29/20 1548      PT SHORT TERM GOAL #1   Title Patient to demo initial HEP back to PT w/o need of cuing  Baseline initial HEP issued today    Time 4    Period Weeks    Status New    Target Date 01/16/21      PT SHORT TERM GOAL #2   Title patine to able to demo stand pivot and STS transfers with S    Baseline able to perform stand pivot and STS transfers with CGA    Time 4    Period Weeks    Status New    Target Date 01/16/21      PT SHORT TERM GOAL #3   Title patient able to ambulate 316f aross level surfaces using RW and light CGA    Baseline 1152fwith RW across level surfaces with CGA and WC follow    Time 4    Period Weeks    Status New    Target Date 01/16/21      PT SHORT TERM GOAL #4   Title patient to demo I in bed mobility with focus on control of RLE    Baseline requires CGA to transition from sit/supine needing to scoop RLE onto bed using LLE    Time 4    Period Weeks    Status New    Target Date 01/16/21      PT  SHORT TERM GOAL #5   Title Assess BERG and set appropriate goal    Baseline Berg performed and LTG written    Time 4    Period Weeks    Status Achieved    Target Date 01/16/21             PT Long Term Goals - 12/29/20 1548      PT LONG TERM GOAL #1   Title Pt will increase Berg score from 25/56 to >30/56 for improved balance and decreased fall risk.    Baseline 12/29/20 25/56    Time 8    Period Weeks    Status New      PT LONG TERM GOAL #2   Title Ambulate 50032fith LRAD across level and unlevel ground with S    Baseline 115f21fth RW across level ground with CGA    Time 8    Period Weeks    Status New      PT LONG TERM GOAL #3   Title improve RLE strength throughout from 2 to 3/5 to 3+/5 to allow participatin in functional tasks    Baseline 2 to 3/5 RLE sterngth    Time 8    Period Weeks    Status New      PT LONG TERM GOAL #4   Title Patient to demo I in all transfers    Baseline CGA stand pivot and STS transfers    Time 8    Period Weeks    Status New      PT LONG TERM GOAL #5   Title patient will ambulate room to room distances with LRAd under S    Baseline 115ft6fh RW and CGA    Time 8    Period Weeks    Status New                 Plan - 01/01/21 1803    Clinical Impression Statement Todays skilled session focused on continued gait training with emphasis on RLE swing through with faciltation of WS and pelvic rotations, RW height adjusted to correct posture, addedd strengthening of abd and hamstrings to assist in returning to normal gait pattern by reducing ton and  improving mobility    Personal Factors and Comorbidities Comorbidity 1    Comorbidities disease process    Examination-Activity Limitations Bed Mobility;Locomotion Level;Transfers;Continence;Toileting    Examination-Participation Restrictions Driving    Stability/Clinical Decision Making Stable/Uncomplicated    Rehab Potential Good    PT Frequency 2x / week    PT Duration 8 weeks     PT Treatment/Interventions ADLs/Self Care Home Management;Aquatic Therapy;Electrical Stimulation;DME Instruction;Gait training;Stair training;Functional mobility training;Therapeutic activities;Therapeutic exercise;Balance training;Neuromuscular re-education;Manual techniques;Wheelchair mobility training;Orthotic Fit/Training;Patient/family education    PT Next Visit Plan continue gait training and extending distance, tall kneeling as tolerated to focus on pelvic stability and mobility as well as improve flexibility, will consider bracing and Bioness if unable to correct swing through deficits    PT Home Exercise Plan [id number]    Consulted and Agree with Plan of Care Patient           Patient will benefit from skilled therapeutic intervention in order to improve the following deficits and impairments:  Abnormal gait,Decreased range of motion,Difficulty walking,Decreased endurance,Decreased activity tolerance,Decreased balance,Decreased mobility,Decreased strength  Visit Diagnosis: Unsteadiness on feet  Muscle weakness (generalized)     Problem List Patient Active Problem List   Diagnosis Date Noted  . 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 Neurogenic bladder 12/08/2020  . Neurogenic bowel 12/08/2020  . Paraplegia following spinal cord injury ([place]) 12/08/2020  . Wheelchair dependence 12/08/2020  . Spasticity 12/08/2020  . Chronic bilateral thoracic back pain 12/08/2020    Lanice Shirts PT 01/01/2021, 6:10 PM  [facility] [address] [place], [place], [address] Phone: [phone]   Fax:  [phone]  Name: Maurice Cruz MRN: [id number] Date of Birth: 01-30-1966

## 2021-01-01 NOTE — Patient Instructions (Signed)
Access Code: [email or web address] URL: [email or web address] Date: 01/01/2021 Prepared by: Sharlynn Oliphant  Exercises Supine Heel Slide with Strap - 2 x daily - 7 x weekly - 3 sets - 10 reps Supine Bridge - 2 x daily - 7 x weekly - 3 sets - 10 reps Bent Knee Fallouts - 2 x daily - 7 x weekly - 3 sets - 10 reps Hook Lying Single Knee to Chest Stretch with Towel - 2 x daily - 7 x weekly - 3 sets - 10 reps Seated Heel Slide - 2 x daily - 7 x weekly - 3 sets - 10 reps Seated Hip Abduction with Resistance - 2 x daily - 7 x weekly - 3 sets - 10 reps

## 2021-01-03 ENCOUNTER — Encounter: Payer: Self-pay | Admitting: Physical Therapy

## 2021-01-03 ENCOUNTER — Other Ambulatory Visit: Payer: Self-pay

## 2021-01-03 ENCOUNTER — Ambulatory Visit: Payer: BC Managed Care – PPO | Admitting: Physical Therapy

## 2021-01-03 DIAGNOSIS — R2689 Other abnormalities of gait and mobility: Secondary | ICD-10-CM | POA: Diagnosis not present

## 2021-01-03 DIAGNOSIS — D099 Carcinoma in situ, unspecified: Secondary | ICD-10-CM | POA: Insufficient documentation

## 2021-01-03 DIAGNOSIS — M6281 Muscle weakness (generalized): Secondary | ICD-10-CM

## 2021-01-03 NOTE — Therapy (Signed)
[facility] [address] [place], [place], [address] Phone: [phone]   Fax:  [phone]  Physical Therapy Treatment  Patient Details  Name: Maurice Cruz MRN: [id number] Date of Birth: 05/18/1966 Referring Provider (PT): Dr Courtney Heys   Encounter Date: 01/03/2021   PT End of Session - 01/03/21 1236    Visit Number 5    Number of Visits 17    Date for PT Re-Evaluation 02/13/21    Authorization Type BCBS    PT Start Time 1232    PT Stop Time 1315    PT Time Calculation (min) 43 min    Equipment Utilized During Treatment Gait belt    Activity Tolerance Patient tolerated treatment well;No increased pain;Patient limited by fatigue    Behavior During Therapy [facility] for tasks assessed/performed           Past Medical History:  Diagnosis Date  . Anxiety   . Hx of spinal cord injury 03/2020  . Hypertension     History reviewed. No pertinent surgical history.  There were no vitals filed for this visit.   Subjective Assessment - 01/03/21 1236    Subjective No new complaints. No falls or pain to report.    Patient is accompained by: Family member   dad   Pertinent History Began to notice symptoms in 11/21, had 2 week hospital stay followed by 1 mo. at [facility] in [place].  Was receiving OPPT at [facility] for LE strengthening however referring MD recommended this clinic, has since moved in with parents for physical as well as financial assistance and has obtained and installed a chair lift, denies LE pain but has a hx of low back pain worse with prolonged sitting    How long can you sit comfortably? 30 min    How long can you stand comfortably? <5 min    How long can you walk comfortably? <5 min    Currently in Pain? No/denies                 [facility] Adult PT Treatment/Exercise - 01/03/21 1237      Transfers   Transfers Sit to Stand;Stand to Sit    Sit to Stand 5: Supervision;With upper extremity  assist;From bed;From chair/3-in-1    Stand to Sit 5: Supervision;With upper extremity assist;To bed;To chair/3-in-1      Ambulation/Gait   Ambulation/Gait Yes    Ambulation/Gait Assistance 4: Min guard    Ambulation/Gait Assistance Details with 1st rep cues for increased knee.hip felxion for improved step length and for heel strike as well. added green band assist  for DF/crossed behind the knee/attached at the gait belt to assist with hip/knee flexion for  second gait rep with improved foot clearance, hip/knee flexion and step placement noted. Attempted to try posterior [place] walkon brace with gait however pt work shoes that had attached laces/tounge of shoe and opening to put foot into shoe was limited, therefore brace did not fit. Pt to bring different sneaker next session to try brace with gait.    Ambulation Distance (Feet) 115 Feet   x1, 230 x1   Assistive device Rolling walker    Gait Pattern Step-through pattern;Decreased stride length;Decreased step length - right;Decreased stance time - left;Decreased hip/knee flexion - right;Decreased dorsiflexion - right;Lateral hip instability;Decreased trunk rotation;Narrow base of support;Poor foot clearance - right    Ambulation Surface Level;Indoor      Knee/Hip Exercises: Supine   Short Arc Quad Sets AROM;Strengthening;2 sets;10  reps;Limitations;Both    Short Arc [employer] Limitations manual stabilization to patella with lateral shift with each rep to decrease pain, cues for slow and controlled movements    Heel Slides AAROM;Strengthening;Right;1 set;10 reps;Limitations    Heel Slides Limitations with foot on pillowcase for sliding foot on mat table, assist needed for full hip/knee flexion and controlled movements.    Bridges Limitations with yoga block squeeze to further engage VMO on right side, cues for increased hip lift from mat table and slow lowering back to mat table.    Bridges with Greig Right AROM;Strengthening;Both;2 sets;10  reps;Limitations                    PT Short Term Goals - 12/29/20 1548      PT SHORT TERM GOAL #1   Title Patient to demo initial HEP back to PT w/o need of cuing    Baseline initial HEP issued today    Time 4    Period Weeks    Status New    Target Date 01/16/21      PT SHORT TERM GOAL #2   Title patine to able to demo stand pivot and STS transfers with S    Baseline able to perform stand pivot and STS transfers with CGA    Time 4    Period Weeks    Status New    Target Date 01/16/21      PT SHORT TERM GOAL #3   Title patient able to ambulate 385f aross level surfaces using RW and light CGA    Baseline 1130fwith RW across level surfaces with CGA and WC follow    Time 4    Period Weeks    Status New    Target Date 01/16/21      PT SHORT TERM GOAL #4   Title patient to demo I in bed mobility with focus on control of RLE    Baseline requires CGA to transition from sit/supine needing to scoop RLE onto bed using LLE    Time 4    Period Weeks    Status New    Target Date 01/16/21      PT SHORT TERM GOAL #5   Title Assess BERG and set appropriate goal    Baseline Berg performed and LTG written    Time 4    Period Weeks    Status Achieved    Target Date 01/16/21             PT Long Term Goals - 12/29/20 1548      PT LONG TERM GOAL #1   Title Pt will increase Berg score from 25/56 to >30/56 for improved balance and decreased fall risk.    Baseline 12/29/20 25/56    Time 8    Period Weeks    Status New      PT LONG TERM GOAL #2   Title Ambulate 5005fith LRAD across level and unlevel ground with S    Baseline 115f2fth RW across level ground with CGA    Time 8    Period Weeks    Status New      PT LONG TERM GOAL #3   Title improve RLE strength throughout from 2 to 3/5 to 3+/5 to allow participatin in functional tasks    Baseline 2 to 3/5 RLE sterngth    Time 8    Period Weeks    Status New      PT LONG TERM  GOAL #4   Title Patient to demo  I in all transfers    Baseline CGA stand pivot and STS transfers    Time 8    Period Weeks    Status New      PT LONG TERM GOAL #5   Title patient will ambulate room to room distances with LRAd under S    Baseline 125f with RW and CGA    Time 8    Period Weeks    Status New                 Plan - 01/03/21 1236    Clinical Impression Statement Today's skilled session continued to focus on strengthening and gait training with RW. Attempted to use brace on right LE, unable to this session due to shoe design. No issues reported or noted with session. The pt is progressing toward goals and should benefit from continued PT to progress toward unmet goals.    Personal Factors and Comorbidities Comorbidity 1    Comorbidities disease process    Examination-Activity Limitations Bed Mobility;Locomotion Level;Transfers;Continence;Toileting    Examination-Participation Restrictions Driving    Stability/Clinical Decision Making Stable/Uncomplicated    Rehab Potential Good    PT Frequency 2x / week    PT Duration 8 weeks    PT Treatment/Interventions ADLs/Self Care Home Management;Aquatic Therapy;Electrical Stimulation;DME Instruction;Gait training;Stair training;Functional mobility training;Therapeutic activities;Therapeutic exercise;Balance training;Neuromuscular re-education;Manual techniques;Wheelchair mobility training;Orthotic Fit/Training;Patient/family education    PT Next Visit Plan continue gait training and extending distance, tall kneeling as tolerated to focus on pelvic stability and mobility as well as improve flexibility, will consider bracing and Bioness if unable to correct swing through deficits    PT Home Exercise Plan [id number]   Consulted and Agree with Plan of Care Patient           Patient will benefit from skilled therapeutic intervention in order to improve the following deficits and impairments:  Abnormal gait,Decreased range of motion,Difficulty walking,Decreased  endurance,Decreased activity tolerance,Decreased balance,Decreased mobility,Decreased strength  Visit Diagnosis: Muscle weakness (generalized)  Other abnormalities of gait and mobility     Problem List Patient Active Problem List   Diagnosis Date Noted  . 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 Neurogenic bladder 12/08/2020  . Neurogenic bowel 12/08/2020  . Paraplegia following spinal cord injury ([place] 12/08/2020  . Wheelchair dependence 12/08/2020  . Spasticity 12/08/2020  . Chronic bilateral thoracic back pain 12/08/2020    KWillow Ora PTA, CCura[facility]oughtonOutpatient Neuro RLa Palma Intercommunity Hospital[address] [place] [phone]/06/22, 10:19 PM   Name: MMARCELUS GARRISMRN: [id number] of Birth: [birth year]

## 2021-01-08 ENCOUNTER — Ambulatory Visit: Payer: BC Managed Care – PPO

## 2021-01-09 ENCOUNTER — Ambulatory Visit: Payer: BC Managed Care – PPO

## 2021-01-09 ENCOUNTER — Other Ambulatory Visit: Payer: Self-pay

## 2021-01-09 DIAGNOSIS — R2689 Other abnormalities of gait and mobility: Secondary | ICD-10-CM | POA: Diagnosis not present

## 2021-01-09 DIAGNOSIS — M6281 Muscle weakness (generalized): Secondary | ICD-10-CM

## 2021-01-09 DIAGNOSIS — G373 Acute transverse myelitis in demyelinating disease of central nervous system: Secondary | ICD-10-CM

## 2021-01-09 DIAGNOSIS — R2681 Unsteadiness on feet: Secondary | ICD-10-CM

## 2021-01-09 NOTE — Therapy (Signed)
[facility] [address] [place], [place], [address] Phone: [phone]   Fax:  [phone]  Physical Therapy Treatment  Patient Details  Name: Maurice Cruz MRN: [id number] Date of Birth: 09/20/1966 Referring Provider (PT): Dr Courtney Heys   Encounter Date: 01/09/2021   PT End of Session - 01/09/21 1332    Visit Number 6    Number of Visits 17    Date for PT Re-Evaluation 02/13/21    Authorization Type BCBS    PT Start Time 1315    PT Stop Time 1400    PT Time Calculation (min) 45 min    Equipment Utilized During Treatment Gait belt    Activity Tolerance Patient tolerated treatment well;No increased pain;Patient limited by fatigue    Behavior During Therapy [facility] for tasks assessed/performed           Past Medical History:  Diagnosis Date  . Anxiety   . Hx of spinal cord injury 03/2020  . Hypertension     History reviewed. No pertinent surgical history.  There were no vitals filed for this visit.   Subjective Assessment - 01/09/21 1322    Subjective No changes to report, no falls to report    Patient is accompained by: Family member   dad   Pertinent History Began to notice symptoms in 11/21, had 2 week hospital stay followed by 1 mo. at [facility] in [place].  Was receiving OPPT at [facility] for LE strengthening however referring MD recommended this clinic, has since moved in with parents for physical as well as financial assistance and has obtained and installed a chair lift, denies LE pain but has a hx of low back pain worse with prolonged sitting    How long can you sit comfortably? 30 min    How long can you stand comfortably? <5 min    How long can you walk comfortably? <5 min                             [facility] Adult PT Treatment/Exercise - 01/09/21 0001      Ambulation/Gait   Ambulation/Gait Yes    Ambulation/Gait Assistance 4: Min guard    Ambulation/Gait Assistance Details  with R AFO    Ambulation Distance (Feet) 115 Feet    Assistive device Rolling walker    Gait Pattern Step-through pattern    Ambulation Surface Level;Indoor      Knee/Hip Exercises: Seated   Hamstring Curl Strengthening;Right;2 sets;10 reps    Hamstring Limitations performed from edge of mat over towel on floor      Knee/Hip Exercises: Supine   Other Supine Knee/Hip Exercises hip IR/ER 2x10    Other Supine Knee/Hip Exercises supine in hooklie hip fallouts 2x10 against light manual resistance, SKTC R over small ball with AAROM                    PT Short Term Goals - 12/29/20 1548      PT SHORT TERM GOAL #1   Title Patient to demo initial HEP back to PT w/o need of cuing    Baseline initial HEP issued today    Time 4    Period Weeks    Status New    Target Date 01/16/21      PT SHORT TERM GOAL #2   Title patine to able to demo stand pivot and STS transfers with S  Baseline able to perform stand pivot and STS transfers with CGA    Time 4    Period Weeks    Status New    Target Date 01/16/21      PT SHORT TERM GOAL #3   Title patient able to ambulate 335f aross level surfaces using RW and light CGA    Baseline 1139fwith RW across level surfaces with CGA and WC follow    Time 4    Period Weeks    Status New    Target Date 01/16/21      PT SHORT TERM GOAL #4   Title patient to demo I in bed mobility with focus on control of RLE    Baseline requires CGA to transition from sit/supine needing to scoop RLE onto bed using LLE    Time 4    Period Weeks    Status New    Target Date 01/16/21      PT SHORT TERM GOAL #5   Title Assess BERG and set appropriate goal    Baseline Berg performed and LTG written    Time 4    Period Weeks    Status Achieved    Target Date 01/16/21             PT Long Term Goals - 12/29/20 1548      PT LONG TERM GOAL #1   Title Pt will increase Berg score from 25/56 to >30/56 for improved balance and decreased fall risk.     Baseline 12/29/20 25/56    Time 8    Period Weeks    Status New      PT LONG TERM GOAL #2   Title Ambulate 50037fith LRAD across level and unlevel ground with S    Baseline 115f42fth RW across level ground with CGA    Time 8    Period Weeks    Status New      PT LONG TERM GOAL #3   Title improve RLE strength throughout from 2 to 3/5 to 3+/5 to allow participatin in functional tasks    Baseline 2 to 3/5 RLE sterngth    Time 8    Period Weeks    Status New      PT LONG TERM GOAL #4   Title Patient to demo I in all transfers    Baseline CGA stand pivot and STS transfers    Time 8    Period Weeks    Status New      PT LONG TERM GOAL #5   Title patient will ambulate room to room distances with LRAd under S    Baseline 115ft46fh RW and CGA    Time 8    Period Weeks    Status New                 Plan - 01/09/21 1411    Clinical Impression Statement Todays skilled session consisted of gait training with R AFO as well as attempt to add stepping tasks and R hip/kne strengthening with focus on hamstrings and hip flexors.  No marked gains in ambulation while wearing brace as dysfunction lies in inability to flex R hip and swing LE fwd.  Added activities and tasks in supine to adress hip/knee strength deficits requiring manual stabilization for proper form.  R hamstring flexibility appears to be WFL. [facility] tasks in //bars but patient unable to flex R hip to place on AIrex    Personal Factors  and Comorbidities Comorbidity 1    Comorbidities disease process    Examination-Activity Limitations Bed Mobility;Locomotion Level;Transfers;Continence;Toileting    Examination-Participation Restrictions Driving    Stability/Clinical Decision Making Stable/Uncomplicated    Rehab Potential Good    PT Frequency 2x / week    PT Duration 8 weeks    PT Treatment/Interventions ADLs/Self Care Home Management;Aquatic Therapy;Electrical Stimulation;DME Instruction;Gait training;Stair  training;Functional mobility training;Therapeutic activities;Therapeutic exercise;Balance training;Neuromuscular re-education;Manual techniques;Wheelchair mobility training;Orthotic Fit/Training;Patient/family education    PT Next Visit Plan continue gait training and extending distance, tall kneeling as tolerated to focus on pelvic stability and mobility as well as improve flexibility, will consider bracing and Bioness if unable to correct swing through deficits, continue to facilitate R hip and knee flexion    PT Home Exercise Plan [id number]    Consulted and Agree with Plan of Care Patient           Patient will benefit from skilled therapeutic intervention in order to improve the following deficits and impairments:  Abnormal gait,Decreased range of motion,Difficulty walking,Decreased endurance,Decreased activity tolerance,Decreased balance,Decreased mobility,Decreased strength  Visit Diagnosis: Unsteadiness on feet  Muscle weakness (generalized)  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[place])     Problem List Patient Active Problem List   Diagnosis Date Noted  . 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 Neurogenic bladder 12/08/2020  . Neurogenic bowel 12/08/2020  . Paraplegia following spinal cord injury ([place]) 12/08/2020  . Wheelchair dependence 12/08/2020  . Spasticity 12/08/2020  . Chronic bilateral thoracic back pain 12/08/2020    Lanice Shirts 01/09/2021, 2:25 PM  [facility] [address] [place], [place], [address] Phone: [phone]   Fax:  [phone]  Name: REXALL MAYBANK MRN: [id number] Date of Birth: 1965/10/01

## 2021-01-10 ENCOUNTER — Ambulatory Visit: Payer: BC Managed Care – PPO | Admitting: Physical Therapy

## 2021-01-10 ENCOUNTER — Encounter: Payer: Self-pay | Admitting: Physical Therapy

## 2021-01-10 DIAGNOSIS — R2689 Other abnormalities of gait and mobility: Secondary | ICD-10-CM

## 2021-01-10 DIAGNOSIS — M6281 Muscle weakness (generalized): Secondary | ICD-10-CM

## 2021-01-10 DIAGNOSIS — R2681 Unsteadiness on feet: Secondary | ICD-10-CM

## 2021-01-11 NOTE — Therapy (Signed)
[facility] [address] [place], [place], [address] Phone: [phone]   Fax:  [phone]  Physical Therapy Treatment  Patient Details  Name: Maurice Cruz MRN: [id number] Date of Birth: [birth year] Referring Provider (PT): Dr Courtney Heys   Encounter Date: 01/10/2021   PT End of Session - 01/10/21 1237    Visit Number 7    Number of Visits 17    Date for PT Re-Evaluation 02/13/21    Authorization Type BCBS    PT Start Time [id number]    PT Stop Time 1315    PT Time Calculation (min) 40 min    Equipment Utilized During Treatment Gait belt    Activity Tolerance Patient tolerated treatment well;No increased pain;Patient limited by fatigue    Behavior During Therapy [facility] for tasks assessed/performed           Past Medical History:  Diagnosis Date  . Anxiety   . Hx of spinal cord injury 03/2020  . Hypertension     History reviewed. No pertinent surgical history.  There were no vitals filed for this visit.   Subjective Assessment - 01/10/21 1237    Subjective No new complaints. No falls or pain to report. Used walker to walk from car into house yesterday after therapy session.    Patient is accompained by: Family member   mom   Pertinent History Began to notice symptoms in 11/21, had 2 week hospital stay followed by 1 mo. at [facility] in [place].  Was receiving OPPT at [facility] for LE strengthening however referring MD recommended this clinic, has since moved in with parents for physical as well as financial assistance and has obtained and installed a chair lift, denies LE pain but has a hx of low back pain worse with prolonged sitting    How long can you sit comfortably? 30 min    How long can you stand comfortably? <5 min    How long can you walk comfortably? <5 min    Currently in Pain? No/denies              [facility] Adult PT Treatment/Exercise - 01/10/21 1238      Transfers   Transfers Sit to Stand;Stand  to [employer] Transfers    Sit to Stand 5: Supervision;With upper extremity assist;From bed;From chair/3-in-1    Stand to Sit 5: Supervision;With upper extremity assist;To bed;To chair/3-in-1    Stand Pivot Transfers 5: Supervision    Stand Pivot Transfer Details (indicate cue type and reason) with RW from wheelchair to/rom mat table      Ambulation/Gait   Ambulation/Gait Yes      Exercises   Exercises Other Exercises    Other Exercises  tall kneeling on mat table with hands on Kaye bench: alternating UE raises for 10 reps each side with assist at pelvis for posture/stability. Then had pt work on mini squats with tremors noted with return to tall kneeling in trunk/UE's, assist needed at times to initiate the movements. Then back in tall kneeling had pt work on moving right knee forward/backwards x 5 reps, then laterally out/back in for 5 reps. Min guard to min assist with rehab tech holding the bench steady.      Knee/Hip Exercises: Supine   Bridges Limitations with legs over red pball and arms at sides x 10 reps with assist to stabilize the ball.  Then had pt squeeze yoga block between thighs while bridging for 10 reps.    Knee  Flexion Limitations with feet on red pball- hamstring curls for 10 reps with assist to stabilize ball;     Other Supine Knee/Hip Exercises hip fall outs with red band resistance for 10 reps on right LE with cues for slow, controlled movements; in prone right hamstring curls for 5 reps with assist needed.    Other Supine Knee/Hip Exercises in hooklying at edge of mat table: with right knee flexed had pt lower foot to floor<>back up to mat for 10 reps with assist to maintain knee flexion and for movements of lowring/lifting LE.                PT Short Term Goals - 01/10/21 1238      PT SHORT TERM GOAL #1   Title Patient to demo initial HEP back to PT w/o need of cuing    Baseline initial HEP issued today    Time 4    Period Weeks    Status On-going     Target Date 01/16/21      PT SHORT TERM GOAL #2   Title patine to able to demo stand pivot and STS transfers with S    Baseline able to perform stand pivot and STS transfers with CGA    Time 4    Period Weeks    Status On-going    Target Date 01/16/21      PT SHORT TERM GOAL #3   Title patient able to ambulate 377f aross level surfaces using RW and light CGA    Baseline 1172fwith RW across level surfaces with CGA and WC follow    Time 4    Period Weeks    Status On-going    Target Date 01/16/21      PT SHORT TERM GOAL #4   Title patient to demo I in bed mobility with focus on control of RLE    Baseline requires CGA to transition from sit/supine needing to scoop RLE onto bed using LLE    Time 4    Period Weeks    Status On-going    Target Date 01/16/21      PT SHORT TERM GOAL #5   Title Assess BERG and set appropriate goal    Baseline Berg performed and LTG written    Time 4    Period Weeks    Status Achieved    Target Date 01/16/21             PT Long Term Goals - 12/29/20 1548      PT LONG TERM GOAL #1   Title Pt will increase Berg score from 25/56 to >30/56 for improved balance and decreased fall risk.    Baseline 12/29/20 25/56    Time 8    Period Weeks    Status New      PT LONG TERM GOAL #2   Title Ambulate 50058fith LRAD across level and unlevel ground with S    Baseline 115f22fth RW across level ground with CGA    Time 8    Period Weeks    Status New      PT LONG TERM GOAL #3   Title improve RLE strength throughout from 2 to 3/5 to 3+/5 to allow participatin in functional tasks    Baseline 2 to 3/5 RLE sterngth    Time 8    Period Weeks    Status New      PT LONG TERM GOAL #4   Title Patient to demo I  in all transfers    Baseline CGA stand pivot and STS transfers    Time 8    Period Weeks    Status New      PT LONG TERM GOAL #5   Title patient will ambulate room to room distances with LRAd under S    Baseline 161f with RW and CGA     Time 8    Period Weeks    Status New                 Plan - 01/10/21 1238    Clinical Impression Statement Today's skilled session continued to focus on strengthening and muscle re-ed with no issues other than fatigue reported. The pt is making steady progress toward goals and should benefit from continued PT to progress toward unmet goals.   Personal Factors and Comorbidities Comorbidity 1    Comorbidities disease process    Examination-Activity Limitations Bed Mobility;Locomotion Level;Transfers;Continence;Toileting    Examination-Participation Restrictions Driving    Stability/Clinical Decision Making Stable/Uncomplicated    Rehab Potential Good    PT Frequency 2x / week    PT Duration 8 weeks    PT Treatment/Interventions ADLs/Self Care Home Management;Aquatic Therapy;Electrical Stimulation;DME Instruction;Gait training;Stair training;Functional mobility training;Therapeutic activities;Therapeutic exercise;Balance training;Neuromuscular re-education;Manual techniques;Wheelchair mobility training;Orthotic Fit/Training;Patient/family education    PT Next Visit Plan continue gait training and extending distance, tall kneeling as tolerated to focus on pelvic stability and mobility as well as improve flexibility, will consider bracing and Bioness if unable to correct swing through deficits, continue to facilitate R hip and knee flexion    PT Home Exercise Plan [id number]   Consulted and Agree with Plan of Care Patient           Patient will benefit from skilled therapeutic intervention in order to improve the following deficits and impairments:  Abnormal gait,Decreased range of motion,Difficulty walking,Decreased endurance,Decreased activity tolerance,Decreased balance,Decreased mobility,Decreased strength  Visit Diagnosis: Unsteadiness on feet  Muscle weakness (generalized)  Other abnormalities of gait and mobility     Problem List Patient Active Problem List   Diagnosis  Date Noted  . 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 12/08[place]  . Neurogenic bladder 12/08/2020  . Neurogenic bowel 12/08/2020  . Paraplegia [place] ([place] 12/08/[place]  . Wheelchair dependence 12/08/2020  . Spasticity 12/08/2020  . Chronic bilateral thoracic back pain 12/08/2020   KWillow Ora PTA, [facility] [address] [place]essemerGBeatt[place] [phone]/14/22, 9:23 PM   Name: Maurice KIMBERLINGMRN: [id number] of Birth: 1Jul 11, 1967

## 2021-01-15 ENCOUNTER — Ambulatory Visit: Payer: BC Managed Care – PPO

## 2021-01-15 ENCOUNTER — Other Ambulatory Visit: Payer: Self-pay

## 2021-01-15 DIAGNOSIS — R2689 Other abnormalities of gait and mobility: Secondary | ICD-10-CM

## 2021-01-15 DIAGNOSIS — M6281 Muscle weakness (generalized): Secondary | ICD-10-CM

## 2021-01-15 DIAGNOSIS — R2681 Unsteadiness on feet: Secondary | ICD-10-CM

## 2021-01-15 NOTE — Therapy (Signed)
[facility] [address] [place], [place], [address] Phone: [phone]   Fax:  [phone]  Physical Therapy Treatment  Patient Details  Name: Maurice Cruz MRN: [id number] Date of Birth: 08/14/1966 Referring Provider (PT): Dr Courtney Heys   Encounter Date: 01/15/2021   PT End of Session - 01/15/21 1652    Visit Number 8    Number of Visits 17    Date for PT Re-Evaluation 02/13/21    Authorization Type BCBS    PT Start Time 1230    PT Stop Time 1315    PT Time Calculation (min) 45 min    Equipment Utilized During Treatment Gait belt    Activity Tolerance Patient tolerated treatment well;No increased pain;Patient limited by fatigue    Behavior During Therapy [facility] for tasks assessed/performed           Past Medical History:  Diagnosis Date  . Anxiety   . Hx of spinal cord injury 03/2020  . Hypertension     History reviewed. No pertinent surgical history.  There were no vitals filed for this visit.   Subjective Assessment - 01/15/21 1248    Subjective No pain, falls or med changes to note    Patient is accompained by: Family member   mom   Pertinent History Began to notice symptoms in 11/21, had 2 week hospital stay followed by 1 mo. at [facility] in [place].  Was receiving OPPT at [facility] for LE strengthening however referring MD recommended this clinic, has since moved in with parents for physical as well as financial assistance and has obtained and installed a chair lift, denies LE pain but has a hx of low back pain worse with prolonged sitting    How long can you sit comfortably? 30 min    How long can you stand comfortably? <5 min    How long can you walk comfortably? <5 min                             [facility] Adult PT Treatment/Exercise - 01/15/21 0001      Transfers   Transfers Sit to Stand    Sit to Stand 5: Supervision;4: Min guard    Stand to Sit 5: Supervision    Stand  Pivot Transfers 5: Supervision    Stand Pivot Transfer Details (indicate cue type and reason) from [facility] to mat and back only setup needed      Ambulation/Gait   Ambulation/Gait Yes    Ambulation/Gait Assistance 4: Min guard    Ambulation Distance (Feet) 230 Feet    Assistive device Rolling walker    Gait Pattern Step-through pattern;Decreased step length - right;Decreased step length - left    Ambulation Surface Level;Indoor      Lumbar Exercises: Supine   Other Supine Lumbar Exercises LTR in hooklie    Other Supine Lumbar Exercises DKTC over red physioball with 1.1# ball squeeze, 2x10 with manual assist      Lumbar Exercises: Quadruped   Madcat/Old Horse 10 reps;Limitations    Madcat/Old Horse Limitations manual facilitation    Other Quadruped Lumbar Exercises sitting on heels, 1x10 with manual facilitation    Other Quadruped Lumbar Exercises crawling 5 steps ea. LE      Knee/Hip Exercises: Supine   Bridges Strengthening;Both;2 sets;10 reps    Bridges Limitations with 1.1# ball squeeze  PT Short Term Goals - 01/10/21 1238      PT SHORT TERM GOAL #1   Title Patient to demo initial HEP back to PT w/o need of cuing    Baseline initial HEP issued today    Time 4    Period Weeks    Status On-going    Target Date 01/16/21      PT SHORT TERM GOAL #2   Title patine to able to demo stand pivot and STS transfers with S    Baseline able to perform stand pivot and STS transfers with CGA    Time 4    Period Weeks    Status On-going    Target Date 01/16/21      PT SHORT TERM GOAL #3   Title patient able to ambulate 362f aross level surfaces using RW and light CGA    Baseline 1141fwith RW across level surfaces with CGA and WC follow    Time 4    Period Weeks    Status On-going    Target Date 01/16/21      PT SHORT TERM GOAL #4   Title patient to demo I in bed mobility with focus on control of RLE    Baseline requires CGA to transition from  sit/supine needing to scoop RLE onto bed using LLE    Time 4    Period Weeks    Status On-going    Target Date 01/16/21      PT SHORT TERM GOAL #5   Title Assess BERG and set appropriate goal    Baseline Berg performed and LTG written    Time 4    Period Weeks    Status Achieved    Target Date 01/16/21             PT Long Term Goals - 12/29/20 1548      PT LONG TERM GOAL #1   Title Pt will increase Berg score from 25/56 to >30/56 for improved balance and decreased fall risk.    Baseline 12/29/20 25/56    Time 8    Period Weeks    Status New      PT LONG TERM GOAL #2   Title Ambulate 50032fith LRAD across level and unlevel ground with S    Baseline 115f71fth RW across level ground with CGA    Time 8    Period Weeks    Status New      PT LONG TERM GOAL #3   Title improve RLE strength throughout from 2 to 3/5 to 3+/5 to allow participatin in functional tasks    Baseline 2 to 3/5 RLE sterngth    Time 8    Period Weeks    Status New      PT LONG TERM GOAL #4   Title Patient to demo I in all transfers    Baseline CGA stand pivot and STS transfers    Time 8    Period Weeks    Status New      PT LONG TERM GOAL #5   Title patient will ambulate room to room distances with LRAd under S    Baseline 115ft66fh RW and CGA    Time 8    Period Weeks    Status New                 Plan - 01/15/21 1653    Clinical Impression Statement Todays skilled sssion focused on gait training as well as  exercises in quadriped to facilitate R hip strength and function, ambulation distance limited by fatigue and absence of funcitonal R hip flexor strength    Personal Factors and Comorbidities Comorbidity 1    Comorbidities disease process    Examination-Activity Limitations Bed Mobility;Locomotion Level;Transfers;Continence;Toileting    Examination-Participation Restrictions Driving    Stability/Clinical Decision Making Stable/Uncomplicated    Rehab Potential Good    PT  Frequency 2x / week    PT Duration 8 weeks    PT Treatment/Interventions ADLs/Self Care Home Management;Aquatic Therapy;Electrical Stimulation;DME Instruction;Gait training;Stair training;Functional mobility training;Therapeutic activities;Therapeutic exercise;Balance training;Neuromuscular re-education;Manual techniques;Wheelchair mobility training;Orthotic Fit/Training;Patient/family education    PT Next Visit Plan continue gait training and extending distance, tall kneeling/quadriped as tolerated to focus on pelvic stability and mobility as well as improve flexibility, Bioness if unable to correct swing through deficits, continue to facilitate R hip and knee flexion    PT Home Exercise Plan [id number]    Consulted and Agree with Plan of Care Patient           Patient will benefit from skilled therapeutic intervention in order to improve the following deficits and impairments:  Abnormal gait,Decreased range of motion,Difficulty walking,Decreased endurance,Decreased activity tolerance,Decreased balance,Decreased mobility,Decreased strength  Visit Diagnosis: Unsteadiness on feet  Muscle weakness (generalized)  Other abnormalities of gait and mobility     Problem List Patient Active Problem List   Diagnosis Date Noted  . 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 Neurogenic bladder 12/08/2020  . Neurogenic bowel 12/08/2020  . Paraplegia following spinal cord injury ([place]) 12/08/2020  . Wheelchair dependence 12/08/2020  . Spasticity 12/08/2020  . Chronic bilateral thoracic back pain 12/08/2020    Lanice Shirts 01/15/2021, 4:57 PM  [place] [place] [address] [place], [place], [address] Phone: [phone]   Fax:  [phone]  Name: Maurice Cruz MRN: [id number] Date of Birth: August 22, 1966

## 2021-01-17 ENCOUNTER — Other Ambulatory Visit: Payer: Self-pay

## 2021-01-17 ENCOUNTER — Ambulatory Visit: Payer: BC Managed Care – PPO

## 2021-01-17 DIAGNOSIS — R2689 Other abnormalities of gait and mobility: Secondary | ICD-10-CM | POA: Diagnosis not present

## 2021-01-17 DIAGNOSIS — G373 Acute transverse myelitis in demyelinating disease of central nervous system: Secondary | ICD-10-CM

## 2021-01-17 DIAGNOSIS — R2681 Unsteadiness on feet: Secondary | ICD-10-CM

## 2021-01-17 DIAGNOSIS — M6281 Muscle weakness (generalized): Secondary | ICD-10-CM

## 2021-01-17 NOTE — Therapy (Signed)
[facility] [address] [place], [place], [address] Phone: [phone]   Fax:  [phone]  Physical Therapy Treatment  Patient Details  Name: Maurice Cruz MRN: [id number] Date of Birth: 1965/11/30 Referring Provider (PT): Dr Courtney Heys   Encounter Date: 01/17/2021   PT End of Session - 01/17/21 1237    Visit Number 9    Number of Visits 17    Date for PT Re-Evaluation 02/13/21    Authorization Type BCBS    PT Start Time 1230    PT Stop Time 1315    PT Time Calculation (min) 45 min    Equipment Utilized During Treatment Gait belt    Activity Tolerance Patient tolerated treatment well;No increased pain;Patient limited by fatigue    Behavior During Therapy [facility] for tasks assessed/performed           Past Medical History:  Diagnosis Date  . Anxiety   . Hx of spinal cord injury 03/2020  . Hypertension     History reviewed. No pertinent surgical history.  There were no vitals filed for this visit.   Subjective Assessment - 01/17/21 1237    Subjective No pain, falls or med changes to note, no issues following last sessions activities    Patient is accompained by: Family member   mom   Pertinent History Began to notice symptoms in 11/21, had 2 week hospital stay followed by 1 mo. at [facility] in [place].  Was receiving OPPT at [facility] for LE strengthening however referring MD recommended this clinic, has since moved in with parents for physical as well as financial assistance and has obtained and installed a chair lift, denies LE pain but has a hx of low back pain worse with prolonged sitting    How long can you sit comfortably? 30 min    How long can you stand comfortably? <5 min    How long can you walk comfortably? <5 min    Currently in Pain? No/denies                             [facility] Adult PT Treatment/Exercise - 01/17/21 0001      Transfers   Transfers Sit to Stand    Sit to  Stand 5: Supervision;4: Min guard    Stand to Sit 5: Supervision      Ambulation/Gait   Ambulation/Gait Yes    Ambulation/Gait Assistance 4: Min guard    Ambulation Distance (Feet) 50 Feet    Assistive device Rolling walker    Gait Pattern Step-through pattern    Ambulation Surface Level;Indoor      Lumbar Exercises: Supine   Bridge with Ball Squeeze Non-compliant;10 reps;Limitations    Bridge with [employer] Limitations 2x10 with 1.1# ball    Other Supine Lumbar Exercises hooklie fallouts with 1.1# ball squeeze 2x10    Other Supine Lumbar Exercises DKTC over red ball with TCs to RLE to assist as needed      Lumbar Exercises: Quadruped   Madcat/Old Horse 10 reps;Limitations    Madcat/Old Horse Limitations TCs needed    Other Quadruped Lumbar Exercises rocking onto heels 10x    Other Quadruped Lumbar Exercises Tall kneel with al UE flexion      Knee/Hip Exercises: Aerobic   Nustep L1 8' arms 12                  PT Education - 01/17/21 1356  Education Details discussed need to spend time in prone lying to stertch anterior hips    Person(s) Educated Patient;Parent(s)    Methods Explanation;Demonstration    Comprehension Verbalized understanding            PT Short Term Goals - 01/10/21 1238      PT SHORT TERM GOAL #1   Title Patient to demo initial HEP back to PT w/o need of cuing    Baseline initial HEP issued today    Time 4    Period Weeks    Status On-going    Target Date 01/16/21      PT SHORT TERM GOAL #2   Title patine to able to demo stand pivot and STS transfers with S    Baseline able to perform stand pivot and STS transfers with CGA    Time 4    Period Weeks    Status On-going    Target Date 01/16/21      PT SHORT TERM GOAL #3   Title patient able to ambulate 332f aross level surfaces using RW and light CGA    Baseline 1147fwith RW across level surfaces with CGA and WC follow    Time 4    Period Weeks    Status On-going    Target  Date 01/16/21      PT SHORT TERM GOAL #4   Title patient to demo I in bed mobility with focus on control of RLE    Baseline requires CGA to transition from sit/supine needing to scoop RLE onto bed using LLE    Time 4    Period Weeks    Status On-going    Target Date 01/16/21      PT SHORT TERM GOAL #5   Title Assess BERG and set appropriate goal    Baseline Berg performed and LTG written    Time 4    Period Weeks    Status Achieved    Target Date 01/16/21             PT Long Term Goals - 12/29/20 1548      PT LONG TERM GOAL #1   Title Pt will increase Berg score from 25/56 to >30/56 for improved balance and decreased fall risk.    Baseline 12/29/20 25/56    Time 8    Period Weeks    Status New      PT LONG TERM GOAL #2   Title Ambulate 50029fith LRAD across level and unlevel ground with S    Baseline 115f64fth RW across level ground with CGA    Time 8    Period Weeks    Status New      PT LONG TERM GOAL #3   Title improve RLE strength throughout from 2 to 3/5 to 3+/5 to allow participatin in functional tasks    Baseline 2 to 3/5 RLE sterngth    Time 8    Period Weeks    Status New      PT LONG TERM GOAL #4   Title Patient to demo I in all transfers    Baseline CGA stand pivot and STS transfers    Time 8    Period Weeks    Status New      PT LONG TERM GOAL #5   Title patient will ambulate room to room distances with LRAd under S    Baseline 115ft33fh RW and CGA    Time 8    Period  Weeks    Status New                 Plan - 01/17/21 1238    Clinical Impression Statement todays skilled session consisted of initiating Nustep and working in quadriped to improve R hip sterngth and stability.  Patient able to complete Nustep session w/o incident, progressed to tall kneeling with OH flexion alt. UEs, difficulty noted attaing tall kneel due to soft tissue restrictions to anterior hips.  Improved ability to swing RLE fwd through swing phase observed     Personal Factors and Comorbidities Comorbidity 1    Comorbidities disease process    Examination-Activity Limitations Bed Mobility;Locomotion Level;Transfers;Continence;Toileting    Examination-Participation Restrictions Driving    Stability/Clinical Decision Making Stable/Uncomplicated    Rehab Potential Good    PT Frequency 2x / week    PT Duration 8 weeks    PT Treatment/Interventions ADLs/Self Care Home Management;Aquatic Therapy;Electrical Stimulation;DME Instruction;Gait training;Stair training;Functional mobility training;Therapeutic activities;Therapeutic exercise;Balance training;Neuromuscular re-education;Manual techniques;Wheelchair mobility training;Orthotic Fit/Training;Patient/family education    PT Next Visit Plan continue to encourage activity to facilitatate R hip flexion in varied positions, supine, quadriped, sidelie    PT Home Exercise Plan [id number]    Consulted and Agree with Plan of Care Patient           Patient will benefit from skilled therapeutic intervention in order to improve the following deficits and impairments:  Abnormal gait,Decreased range of motion,Difficulty walking,Decreased endurance,Decreased activity tolerance,Decreased balance,Decreased mobility,Decreased strength  Visit Diagnosis: No diagnosis found.     Problem List Patient Active Problem List   Diagnosis Date Noted  . 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 Neurogenic bladder 12/08/2020  . Neurogenic bowel 12/08/2020  . Paraplegia following spinal cord injury ([place]) 12/08/2020  . Wheelchair dependence 12/08/2020  . Spasticity 12/08/2020  . Chronic bilateral thoracic back pain 12/08/2020    Lanice Shirts PT 01/17/2021, 2:27 PM  [facility] [address] [place], [place], [address] Phone: [phone]   Fax:  [phone]  Name: Maurice Cruz MRN: [id number] Date of Birth: Jun 22, 1966

## 2021-01-22 ENCOUNTER — Other Ambulatory Visit: Payer: Self-pay

## 2021-01-22 ENCOUNTER — Ambulatory Visit: Payer: BC Managed Care – PPO

## 2021-01-22 DIAGNOSIS — R2681 Unsteadiness on feet: Secondary | ICD-10-CM

## 2021-01-22 DIAGNOSIS — M6281 Muscle weakness (generalized): Secondary | ICD-10-CM

## 2021-01-22 DIAGNOSIS — R2689 Other abnormalities of gait and mobility: Secondary | ICD-10-CM

## 2021-01-22 DIAGNOSIS — G373 Acute transverse myelitis in demyelinating disease of central nervous system: Secondary | ICD-10-CM

## 2021-01-22 NOTE — Patient Instructions (Signed)
Access Code: [email or web address] URL: [email or web address] Date: 01/22/2021 Prepared by: Sharlynn Oliphant  Exercises Supine Bridge - 2 x daily - 7 x weekly - 3 sets - 10 reps Hook Lying Single Knee to Chest Stretch with Towel - 2 x daily - 7 x weekly - 3 sets - 10 reps Seated Heel Slide - 2 x daily - 7 x weekly - 3 sets - 10 reps Sidelying Bent Knee Hip Flexion - 2 x daily - 7 x weekly - 2 sets - 10 reps Supine Single Bent Knee Fallout - 2 x daily - 7 x weekly - 2 sets - 10 reps Clamshell - 2 x daily - 7 x weekly - 2 sets - 10 reps

## 2021-01-22 NOTE — Therapy (Signed)
[facility] [address] [place], [place], [address] Phone: [phone]   Fax:  [phone]  Physical Therapy Treatment  Patient Details  Name: Maurice Cruz MRN: [id number] Date of Birth: March 19, 1966 Referring Provider (PT): Dr Courtney Heys   Encounter Date: 01/22/2021   PT End of Session - 01/22/21 1424    Visit Number 10    Number of Visits 17    Date for PT Re-Evaluation 02/13/21    Authorization Type BCBS    PT Start Time 1315    PT Stop Time 1400    PT Time Calculation (min) 45 min    Equipment Utilized During Treatment Gait belt    Activity Tolerance Patient tolerated treatment well;No increased pain;Patient limited by fatigue    Behavior During Therapy [facility] for tasks assessed/performed           Past Medical History:  Diagnosis Date  . Anxiety   . Hx of spinal cord injury 03/2020  . Hypertension     History reviewed. No pertinent surgical history.  There were no vitals filed for this visit.   Subjective Assessment - 01/22/21 1321    Subjective No pain, falls or med changes to note, no issues following last sessions activities    Patient is accompained by: Family member   mom   Pertinent History Began to notice symptoms in 11/21, had 2 week hospital stay followed by 1 mo. at [facility] in [place].  Was receiving OPPT at [facility] for LE strengthening however referring MD recommended this clinic, has since moved in with parents for physical as well as financial assistance and has obtained and installed a chair lift, denies LE pain but has a hx of low back pain worse with prolonged sitting    How long can you sit comfortably? 30 min    How long can you stand comfortably? <5 min    How long can you walk comfortably? <5 min                             [facility] Adult PT Treatment/Exercise - 01/22/21 0001      Transfers   Transfers Sit to Stand    Sit to Stand 5: Supervision    Stand  to Sit 5: Supervision      Ambulation/Gait   Ambulation/Gait Yes    Ambulation/Gait Assistance 4: Min guard    Ambulation/Gait Assistance Details TCs for weight shift and tall posture    Ambulation Distance (Feet) 115 Feet    Assistive device Rolling walker    Gait Pattern Step-through pattern;Decreased weight shift to left;Decreased hip/knee flexion - right    Ambulation Surface Level;Indoor      Lumbar Exercises: Supine   Clam 10 reps;Limitations    Clam Limitations L sidelie with TCs from PT to stabilize    Bridge with Diona Foley Squeeze Non-compliant;15 reps;Limitations    Bridge with [employer] Limitations 2x15 with 1.1# ball squeeze    Other Supine Lumbar Exercises RLE only fallouts through limited ROM to maintain full control      Knee/Hip Exercises: Seated   Long Arc Quad Strengthening;Right;2 sets;15 reps    Heel Slides Strengthening;Right;2 sets;15 reps                  PT Education - 01/22/21 1423    Education Details revised HEP as noted    Person(s) Educated Patient;Parent(s)    Methods Explanation;Demonstration;Tactile cues;Verbal  cues;Handout    Comprehension Verbalized understanding;Returned demonstration;Need further instruction            PT Short Term Goals - 01/22/21 1425      PT SHORT TERM GOAL #1   Title Patient to demo initial HEP back to PT w/o need of cuing    Baseline initial HEP issued today; 01/22/21 HEP updated today, initial HEP demoed by patient    Time 4    Period Weeks    Status Achieved    Target Date 01/16/21      PT SHORT TERM GOAL #2   Title patine to able to demo stand pivot and STS transfers with S    Baseline able to perform stand pivot and STS transfers with CGA; 01/22/21 Able to perform STS transfer with UE support and only S    Time 4    Period Weeks    Status Achieved    Target Date 01/16/21      PT SHORT TERM GOAL #3   Title patient able to ambulate 381f aross level surfaces using RW and light CGA    Baseline 1125f with RW across level surfaces with CGA and WC follow; 01/22/21 Able to ambulate 23025fith RW and SBA/CGA    Time 4    Period Weeks    Status On-going    Target Date 01/16/21      PT SHORT TERM GOAL #4   Title patient to demo I in bed mobility with focus on control of RLE    Baseline requires CGA to transition from sit/supine needing to scoop RLE onto bed using LLE; 01/22/21 PAtient I in bed mobility accomodating for RLE weakness appropriately    Time 4    Period Weeks    Status Achieved    Target Date 01/16/21      PT SHORT TERM GOAL #5   Title Assess BERG and set appropriate goal    Baseline Berg performed and LTG written    Time 4    Period Weeks    Status Achieved    Target Date 01/16/21             PT Long Term Goals - 12/29/20 1548      PT LONG TERM GOAL #1   Title Pt will increase Berg score from 25/56 to >30/56 for improved balance and decreased fall risk.    Baseline 12/29/20 25/56    Time 8    Period Weeks    Status New      PT LONG TERM GOAL #2   Title Ambulate 500f33fth LRAD across level and unlevel ground with S    Baseline 115ft63fh RW across level ground with CGA    Time 8    Period Weeks    Status New      PT LONG TERM GOAL #3   Title improve RLE strength throughout from 2 to 3/5 to 3+/5 to allow participatin in functional tasks    Baseline 2 to 3/5 RLE sterngth    Time 8    Period Weeks    Status New      PT LONG TERM GOAL #4   Title Patient to demo I in all transfers    Baseline CGA stand pivot and STS transfers    Time 8    Period Weeks    Status New      PT LONG TERM GOAL #5   Title patient will ambulate room to room distances with LRAd  under S    Baseline 125f with RW and CGA    Time 8    Period Weeks    Status New                 Plan - 01/22/21 1429    Clinical Impression Statement Focus of todays skilled session was addressing progress towards STGs and revising HEP to incorporate more supine and sielie activities to  promote strenth and control of R hip and knee.  Hip flexion and stability are increasing and patient able to flex hip I in gravity eliminated position(L sidelie).  Majority of STGs met, ambulation distance limited by time required to ambulate and PT did not want to use alloted treatment time on task.    Personal Factors and Comorbidities Comorbidity 1    Comorbidities disease process    Examination-Activity Limitations Bed Mobility;Locomotion Level;Transfers;Continence;Toileting    Examination-Participation Restrictions Driving    Stability/Clinical Decision Making Stable/Uncomplicated    Rehab Potential Good    PT Frequency 2x / week    PT Duration 8 weeks    PT Treatment/Interventions ADLs/Self Care Home Management;Aquatic Therapy;Electrical Stimulation;DME Instruction;Gait training;Stair training;Functional mobility training;Therapeutic activities;Therapeutic exercise;Balance training;Neuromuscular re-education;Manual techniques;Wheelchair mobility training;Orthotic Fit/Training;Patient/family education    PT Next Visit Plan continue to encourage activity to facilitatate R hip flexion in varied positions, supine, quadriped, sidelie, ait training to facilitate RLE swing through    PT Home Exercise Plan [email or web address]   Consulted and Agree with Plan of Care Patient           Patient will benefit from skilled therapeutic intervention in order to improve the following deficits and impairments:  Abnormal gait,Decreased range of motion,Difficulty walking,Decreased endurance,Decreased activity tolerance,Decreased balance,Decreased mobility,Decreased strength  Visit Diagnosis: Unsteadiness on feet  Muscle weakness (generalized)  Other abnormalities of gait and mobility  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[place]     Problem List Patient Active Problem List   Diagnosis Date Noted  . 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 Neurogenic bladder 12/08/2020  . Neurogenic bowel 12/08/2020  . Paraplegia following  spinal cord injury ([place] 12/08/2020  . Wheelchair dependence 12/08/2020  . Spasticity 12/08/2020  . Chronic bilateral thoracic back pain 12/08/2020    JLanice ShirtsPT 01/22/2021, 2:38 PM  [place] [address]Phone: [phone]  Fax:  [phone] Name: MLONELL STAMOSMRN: [id number] of Birth: [birth year]

## 2021-01-24 ENCOUNTER — Encounter: Payer: Self-pay | Admitting: Physical Therapy

## 2021-01-24 ENCOUNTER — Ambulatory Visit: Payer: BC Managed Care – PPO | Admitting: Physical Therapy

## 2021-01-24 ENCOUNTER — Other Ambulatory Visit: Payer: Self-pay

## 2021-01-24 DIAGNOSIS — R2689 Other abnormalities of gait and mobility: Secondary | ICD-10-CM

## 2021-01-24 DIAGNOSIS — M6281 Muscle weakness (generalized): Secondary | ICD-10-CM

## 2021-01-24 DIAGNOSIS — R2681 Unsteadiness on feet: Secondary | ICD-10-CM

## 2021-01-25 NOTE — Therapy (Signed)
[facility] [address] [place], [place], [address] Phone: [phone]   Fax:  [phone]  Physical Therapy Treatment  Patient Details  Name: Maurice Cruz MRN: [id number] Date of Birth: 1966-02-08 Referring Provider (PT): Dr Courtney Heys   Encounter Date: 01/24/2021     01/24/21 1236  PT Visits / Re-Eval  Visit Number 11  Number of Visits 17  Date for PT Re-Evaluation 02/13/21  Authorization  Authorization Type BCBS  PT Time Calculation  PT Start Time 1233  PT Stop Time 1315  PT Time Calculation (min) 42 min  PT - End of Session  Equipment Utilized During Treatment Gait belt  Activity Tolerance Patient tolerated treatment well;No increased pain  Behavior During Therapy WFL for tasks assessed/performed    Past Medical History:  Diagnosis Date  . Anxiety   . Hx of spinal cord injury 03/2020  . Hypertension     History reviewed. No pertinent surgical history.  There were no vitals filed for this visit.     01/24/21 1235  Symptoms/Limitations  Subjective No new complaints. No falls or pain to report. HEP is going well  Patient is accompained by: Family member (dad)  Pertinent History Began to notice symptoms in 11/21, had 2 week hospital stay followed by 1 mo. at [facility] in [place].  Was receiving OPPT at [facility] for LE strengthening however referring MD recommended this clinic, has since moved in with parents for physical as well as financial assistance and has obtained and installed a chair lift, denies LE pain but has a hx of low back pain worse with prolonged sitting  How long can you sit comfortably? 30 min  How long can you stand comfortably? <5 min  How long can you walk comfortably? <5 min  Pain Assessment  Currently in Pain? No/denies       01/24/21 1237  Transfers  Transfers Sit to Stand;Stand to Sit;Lateral/Scoot Transfers  Sit to Stand 4: Min guard;With upper extremity  assist;From bed  Sit to Stand Details Verbal cues for sequencing;Verbal cues for precautions/safety;Verbal cues for safe use of DME/AE  Sit to Stand Details (indicate cue type and reason) cues for hand placement and weight shifting  Stand to Sit 5: Supervision;With upper extremity assist;To bed  Stand to Sit Details (indicate cue type and reason) Verbal cues for technique  Stand to Sit Details cues for controlled descent with sitting down  Lateral/Scoot Transfers 6: Modified independent (Device/Increase time)  Lateral/Scoot Transfer Details (indicate cue type and reason) wheelchair>mat table  Ambulation/Gait  Ambulation/Gait Yes  Ambulation/Gait Assistance 4: Min guard  Ambulation/Gait Assistance Details use of green theraband assist for DF and hip/knee flexion with swing phase with simulated toe cap on. No toe scuffing noted with simulated toe cap with improved hip/knee flexion noted. continued cues on posture, weight shifting onto right LE in stance and increased left step length.  Ambulation Distance (Feet) 230 Feet (x1)  Assistive device Standard walker  Gait Pattern Step-through pattern;Decreased weight shift to left;Decreased hip/knee flexion - right  Ambulation Surface Level;Indoor  Neuro Re-ed   Neuro Re-ed Details  for strengthening/NMR: standing at RW with green theraband assist for DF/hip and knee flexion  on right LE for foot taps to 4 inch box flat, then the 4 inch box on its side to be taller for 10 reps each. Min guard for balance. cues for increased hip/knee flexion with AA needed at times.  Exercises  Exercises Other Exercises  Other Exercises  standing with RW: no band assist to right LE for mini squats with 5 sec holds for 10 reps. min guard assist for safety.  Knee/Hip Exercises: Supine  Bridges AROM;Strengthening;Both;Limitations  Bridges Limitations with pball under legs with 5 sec holds for 10 reps; then with right single leg bridge- assist to stabilze right knee, cues  for full lift of pelvis for 2 sets of 10 reps.  Other Supine Knee/Hip Exercises with pball at ankles- HS curls for 2 sets of 10 reps with assist for bil foot stability on ball.  Other Supine Knee/Hip Exercises in hooklying at edge of mat table: with right knee flexed had pt lower foot to floor<>back up to mat for 10 reps with assist to maintain knee flexion and for movements of lowering, then lifting back up to mat.  Single Leg Bridge AROM;AAROM;Strengthening;Right;2 sets;10 reps;Limitations          PT Short Term Goals - 01/22/21 1425      PT SHORT TERM GOAL #1   Title Patient to demo initial HEP back to PT w/o need of cuing    Baseline initial HEP issued today; 01/22/21 HEP updated today, initial HEP demoed by patient    Time 4    Period Weeks    Status Achieved    Target Date 01/16/21      PT SHORT TERM GOAL #2   Title patine to able to demo stand pivot and STS transfers with S    Baseline able to perform stand pivot and STS transfers with CGA; 01/22/21 Able to perform STS transfer with UE support and only S    Time 4    Period Weeks    Status Achieved    Target Date 01/16/21      PT SHORT TERM GOAL #3   Title patient able to ambulate 370f aross level surfaces using RW and light CGA    Baseline 1130fwith RW across level surfaces with CGA and WC follow; 01/22/21 Able to ambulate 2308fith RW and SBA/CGA    Time 4    Period Weeks    Status On-going    Target Date 01/16/21      PT SHORT TERM GOAL #4   Title patient to demo I in bed mobility with focus on control of RLE    Baseline requires CGA to transition from sit/supine needing to scoop RLE onto bed using LLE; 01/22/21 PAtient I in bed mobility accomodating for RLE weakness appropriately    Time 4    Period Weeks    Status Achieved    Target Date 01/16/21      PT SHORT TERM GOAL #5   Title Assess BERG and set appropriate goal    Baseline Berg performed and LTG written    Time 4    Period Weeks    Status Achieved     Target Date 01/16/21             PT Long Term Goals - 12/29/20 1548      PT LONG TERM GOAL #1   Title Pt will increase Berg score from 25/56 to >30/56 for improved balance and decreased fall risk.    Baseline 12/29/20 25/56    Time 8    Period Weeks    Status New      PT LONG TERM GOAL #2   Title Ambulate 500f9fth LRAD across level and unlevel ground with S    Baseline 115ft30fh RW across level ground with CGA  Time 8    Period Weeks    Status New      PT LONG TERM GOAL #3   Title improve RLE strength throughout from 2 to 3/5 to 3+/5 to allow participatin in functional tasks    Baseline 2 to 3/5 RLE sterngth    Time 8    Period Weeks    Status New      PT LONG TERM GOAL #4   Title Patient to demo I in all transfers    Baseline CGA stand pivot and STS transfers    Time 8    Period Weeks    Status New      PT LONG TERM GOAL #5   Title patient will ambulate room to room distances with LRAd under S    Baseline 166f with RW and CGA    Time 8    Period Weeks    Status New             01/24/21 1236  Plan  Clinical Impression Statement Today's skilled session continued to focus on strengthening and gait with RW. Used theraband to assist with right LE hip/knee flexion/DF to improve gait mechanics with notable ease with swing phase noted. The pt is making progress and should benefit from continued PT to progress toward unmet goals.  Personal Factors and Comorbidities Comorbidity 1  Comorbidities disease process  Examination-Activity Limitations Bed Mobility;Locomotion Level;Transfers;Continence;Toileting  Examination-Participation Restrictions Driving  Pt will benefit from skilled therapeutic intervention in order to improve on the following deficits Abnormal gait;Decreased range of motion;Difficulty walking;Decreased endurance;Decreased activity tolerance;Decreased balance;Decreased mobility;Decreased strength  Stability/Clinical Decision Making  Stable/Uncomplicated  Rehab Potential Good  PT Frequency 2x / week  PT Duration 8 weeks  PT Treatment/Interventions ADLs/Self Care Home Management;Aquatic Therapy;Electrical Stimulation;DME Instruction;Gait training;Stair training;Functional mobility training;Therapeutic activities;Therapeutic exercise;Balance training;Neuromuscular re-education;Manual techniques;Wheelchair mobility training;Orthotic Fit/Training;Patient/family education  PT Next Visit Plan continue to work on LE strengtheing with emphasis on right LE- supine, quadruped and tall kneeling; gait training with RW  PT Home Exercise Plan [id number] Consulted and Agree with Plan of Care Patient          Patient will benefit from skilled therapeutic intervention in order to improve the following deficits and impairments:  Abnormal gait,Decreased range of motion,Difficulty walking,Decreased endurance,Decreased activity tolerance,Decreased balance,Decreased mobility,Decreased strength  Visit Diagnosis: Unsteadiness on feet  Muscle weakness (generalized)  Other abnormalities of gait and mobility     Problem List Patient Active Problem List   Diagnosis Date Noted  . 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 Neurogenic bladder 12/08/2020  . Neurogenic bowel 12/08/2020  . Paraplegia following spinal cord injury ([place] 12/08/2020  . Wheelchair dependence 12/08/2020  . Spasticity 12/08/2020  . Chronic bilateral thoracic back pain 12/08/2020    KWillow Ora PTA, CBaptist Hospitals Of [facility]ann[facility] Behavioral CenterOutpatient Neuro RSutter [facility]ador Hospital[address] [place]lieG[place] [phone]/28/22, 8:49 PM   Name: MRHYLIN NOSKAMRN: [id number] of Birth: 1September 27, 1967

## 2021-01-29 ENCOUNTER — Ambulatory Visit: Payer: BC Managed Care – PPO | Attending: Physical Medicine and Rehabilitation

## 2021-01-29 ENCOUNTER — Other Ambulatory Visit: Payer: Self-pay

## 2021-01-29 DIAGNOSIS — G373 Acute transverse myelitis in demyelinating disease of central nervous system: Secondary | ICD-10-CM | POA: Diagnosis present

## 2021-01-29 DIAGNOSIS — R2681 Unsteadiness on feet: Secondary | ICD-10-CM | POA: Diagnosis present

## 2021-01-29 DIAGNOSIS — R2689 Other abnormalities of gait and mobility: Secondary | ICD-10-CM | POA: Insufficient documentation

## 2021-01-29 DIAGNOSIS — M6281 Muscle weakness (generalized): Secondary | ICD-10-CM | POA: Diagnosis present

## 2021-01-29 NOTE — Therapy (Signed)
[facility] [address] [place], [place], [address] Phone: [phone]   Fax:  [phone]  Physical Therapy Treatment  Patient Details  Name: Maurice Cruz MRN: [id number] Date of Birth: 1966/03/11 Referring Provider (PT): Dr Courtney Heys   Encounter Date: 01/29/2021   PT End of Session - 01/29/21 1515    Visit Number 12    Number of Visits 17    Date for PT Re-Evaluation 02/13/21    Authorization Type BCBS    PT Start Time 1230    PT Stop Time 1315    PT Time Calculation (min) 45 min    Equipment Utilized During Treatment Gait belt    Activity Tolerance Patient tolerated treatment well;No increased pain    Behavior During Therapy WFL for tasks assessed/performed           Past Medical History:  Diagnosis Date  . Anxiety   . Hx of spinal cord injury 03/2020  . Hypertension     History reviewed. No pertinent surgical history.  There were no vitals filed for this visit.   Subjective Assessment - 01/29/21 1515    Subjective No falls or changes to report    Patient is accompained by: Family member   dad   Pertinent History Began to notice symptoms in 11/21, had 2 week hospital stay followed by 1 mo. at [facility] in [place].  Was receiving OPPT at [facility] for LE strengthening however referring MD recommended this clinic, has since moved in with parents for physical as well as financial assistance and has obtained and installed a chair lift, denies LE pain but has a hx of low back pain worse with prolonged sitting    How long can you sit comfortably? 30 min    How long can you stand comfortably? <5 min    How long can you walk comfortably? <5 min    Currently in Pain? No/denies                             [facility] Adult PT Treatment/Exercise - 01/29/21 0001      Transfers   Transfers Sit to Stand;Stand to Sit    Sit to Stand 4: Min guard      Ambulation/Gait   Ambulation/Gait Yes     Ambulation/Gait Assistance 4: Min guard    Ambulation Distance (Feet) 115 Feet    Assistive device Rolling walker    Gait Pattern Step-to pattern    Ambulation Surface Level;Indoor    Gait Comments encouraged step to gait leading with RLE to clear R foot more effectively      Lumbar Exercises: Supine   Other Supine Lumbar Exercises RLE IR/ER 2x15 CGA from PT    Other Supine Lumbar Exercises LTR with 2.2# ball squeeze 2x10      Knee/Hip Exercises: Aerobic   Nustep L3 8' arms 12      Knee/Hip Exercises: Supine   Other Supine Knee/Hip Exercises unweighted ball b/t knees DKTC 2x10 with tactile assist, minimal to CGA               Balance Exercises - 01/29/21 0001      Balance Exercises: Standing   Sidestepping Upper extremity support;3 reps;Limitations    Sidestepping Limitations 3 steps sideways, 3 trips in // bars Min A needed to facilitae RLE ativitity    Other Standing Exercises L foot on 2" block swinging RLE A/P requiring MinA  to facilitate LE movement               PT Short Term Goals - 01/22/21 1425      PT SHORT TERM GOAL #1   Title Patient to demo initial HEP back to PT w/o need of cuing    Baseline initial HEP issued today; 01/22/21 HEP updated today, initial HEP demoed by patient    Time 4    Period Weeks    Status Achieved    Target Date 01/16/21      PT SHORT TERM GOAL #2   Title patine to able to demo stand pivot and STS transfers with S    Baseline able to perform stand pivot and STS transfers with CGA; 01/22/21 Able to perform STS transfer with UE support and only S    Time 4    Period Weeks    Status Achieved    Target Date 01/16/21      PT SHORT TERM GOAL #3   Title patient able to ambulate 361f aross level surfaces using RW and light CGA    Baseline 1119fwith RW across level surfaces with CGA and WC follow; 01/22/21 Able to ambulate 23067fith RW and SBA/CGA    Time 4    Period Weeks    Status On-going    Target Date 01/16/21      PT  SHORT TERM GOAL #4   Title patient to demo I in bed mobility with focus on control of RLE    Baseline requires CGA to transition from sit/supine needing to scoop RLE onto bed using LLE; 01/22/21 PAtient I in bed mobility accomodating for RLE weakness appropriately    Time 4    Period Weeks    Status Achieved    Target Date 01/16/21      PT SHORT TERM GOAL #5   Title Assess BERG and set appropriate goal    Baseline Berg performed and LTG written    Time 4    Period Weeks    Status Achieved    Target Date 01/16/21             PT Long Term Goals - 12/29/20 1548      PT LONG TERM GOAL #1   Title Pt will increase Berg score from 25/56 to >30/56 for improved balance and decreased fall risk.    Baseline 12/29/20 25/56    Time 8    Period Weeks    Status New      PT LONG TERM GOAL #2   Title Ambulate 500f88fth LRAD across level and unlevel ground with S    Baseline 115ft74fh RW across level ground with CGA    Time 8    Period Weeks    Status New      PT LONG TERM GOAL #3   Title improve RLE strength throughout from 2 to 3/5 to 3+/5 to allow participatin in functional tasks    Baseline 2 to 3/5 RLE sterngth    Time 8    Period Weeks    Status New      PT LONG TERM GOAL #4   Title Patient to demo I in all transfers    Baseline CGA stand pivot and STS transfers    Time 8    Period Weeks    Status New      PT LONG TERM GOAL #5   Title patient will ambulate room to room distances with LRAd under S  Baseline 134f with RW and CGA    Time 8    Period Weeks    Status New                 Plan - 01/29/21 1516    Clinical Impression Statement Todays skilled session focused on gait training n // bars attemptng to sidestep and perform R LE swing throughs whil standing with LLE on 2' block requiring manual assist for LE activity, attempted sidestepping in // bars 3 steps x3 trips with nned of manual facilitation to advance RLE, encouraged patient to try step to pattern  leading with RLE to assist with promote toe clearance    Personal Factors and Comorbidities Comorbidity 1    Comorbidities disease process    Examination-Activity Limitations Bed Mobility;Locomotion Level;Transfers;Continence;Toileting    Examination-Participation Restrictions Driving    Stability/Clinical Decision Making Stable/Uncomplicated    Rehab Potential Good    PT Frequency 2x / week    PT Duration 8 weeks    PT Treatment/Interventions ADLs/Self Care Home Management;Aquatic Therapy;Electrical Stimulation;DME Instruction;Gait training;Stair training;Functional mobility training;Therapeutic activities;Therapeutic exercise;Balance training;Neuromuscular re-education;Manual techniques;Wheelchair mobility training;Orthotic Fit/Training;Patient/family education    PT Next Visit Plan continue to work on LE strengtheing with emphasis on right LE- supine, quadruped and tall kneeling; gait training with RW using modified AFO technique facilitate R hip flexors    PT Home Exercise Plan [id number]   Consulted and Agree with Plan of Care Patient           Patient will benefit from skilled therapeutic intervention in order to improve the following deficits and impairments:  Abnormal gait,Decreased range of motion,Difficulty walking,Decreased endurance,Decreased activity tolerance,Decreased balance,Decreased mobility,Decreased strength  Visit Diagnosis: Unsteadiness on feet  Muscle weakness (generalized)  Other abnormalities of gait and mobility  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[place]     Problem List Patient Active Problem List   Diagnosis Date Noted  . 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 Neurogenic bladder 12/08/2020  . Neurogenic bowel 12/08/2020  . Paraplegia following spinal cord injury ([place] 12/08/2020  . Wheelchair dependence 12/08/2020  . Spasticity 12/08/2020  . Chronic bilateral thoracic back pain 12/08/2020    JLanice ShirtsPT 01/29/2021, 3:30 PM  [place]SP[place]GB[place] [address]Phone: [phone]  Fax:  [phone] Name: Maurice NONGMRN: [id number] of Birth: 1Oct 09, 1967

## 2021-01-31 ENCOUNTER — Encounter: Payer: Self-pay | Admitting: Physical Therapy

## 2021-01-31 ENCOUNTER — Ambulatory Visit: Payer: BC Managed Care – PPO | Admitting: Physical Therapy

## 2021-01-31 ENCOUNTER — Other Ambulatory Visit: Payer: Self-pay

## 2021-01-31 DIAGNOSIS — M6281 Muscle weakness (generalized): Secondary | ICD-10-CM

## 2021-01-31 DIAGNOSIS — R2689 Other abnormalities of gait and mobility: Secondary | ICD-10-CM

## 2021-01-31 DIAGNOSIS — R2681 Unsteadiness on feet: Secondary | ICD-10-CM

## 2021-02-01 NOTE — Therapy (Signed)
[facility] [address] [place], [place], [address] Phone: [phone]   Fax:  [phone]  Physical Therapy Treatment  Patient Details  Name: Maurice Cruz MRN: [id number] Date of Birth: [birth year] Referring Provider (PT): Dr Courtney Heys   Encounter Date: 01/31/2021   PT End of Session - 01/31/21 1237    Visit Number 13    Number of Visits 17    Date for PT Re-Evaluation 02/13/21    Authorization Type BCBS    PT Start Time 1233    PT Stop Time 1315    PT Time Calculation (min) 42 min    Equipment Utilized During Treatment Gait belt    Activity Tolerance Patient tolerated treatment well;No increased pain    Behavior During Therapy WFL for tasks assessed/performed           Past Medical History:  Diagnosis Date  . Anxiety   . Hx of spinal cord injury 03/2020  . Hypertension     History reviewed. No pertinent surgical history.  There were no vitals filed for this visit.   Subjective Assessment - 01/31/21 1236    Subjective No new complaitns. No falls or pain to report. HEP is going well at home.    Patient is accompained by: Family member   dad   Pertinent History Began to notice symptoms in 11/21, had 2 week hospital stay followed by 1 mo. at [facility] in [place].  Was receiving OPPT at [facility] for LE strengthening however referring MD recommended this clinic, has since moved in with parents for physical as well as financial assistance and has obtained and installed a chair lift, denies LE pain but has a hx of low back pain worse with prolonged sitting    How long can you sit comfortably? 30 min    How long can you stand comfortably? <5 min    How long can you walk comfortably? <5 min    Currently in Pain? No/denies                [facility] Adult PT Treatment/Exercise - 01/31/21 1238      Transfers   Transfers Sit to Stand;Stand to Sit    Sit to Stand 4: Min guard;With upper extremity  assist;From bed    Sit to Stand Details (indicate cue type and reason) pt with good hand placement, cues for weight shifting    Stand to Sit 5: Supervision;With upper extremity assist;To bed    Stand to Sit Details cues for use of UE's for controlled descent    Lateral/Scoot Transfers 6: Modified independent (Device/Increase time)    Lateral/Scoot Transfer Details (indicate cue type and reason) wheelchair<>mat table      Ambulation/Gait   Ambulation/Gait Yes    Ambulation/Gait Assistance 4: Min guard    Ambulation/Gait Assistance Details continued use of green band for DF, hip/knee flexion assist. noted right LE with extensor tone with gait limiting pt's knee flexion at times.    Ambulation Distance (Feet) 115 Feet   x1   Assistive device Rolling walker    Gait Pattern Step-to pattern    Ambulation Surface Level;Indoor      Knee/Hip Exercises: Supine   Bridges AROM;AAROM;Strengthening;Both;10 reps;Limitations    Bridges Limitations as follows: bil LE"s with arms across chest for 10 reps; with yoga block squeeze for 10 reps with arms at sides; right LE bent/left LE out for right single leg bridge for 10 reps with arms at sides. cues with  all reps for increased pelvic lifting and breathing with ex's.    Other Supine Knee/Hip Exercises in hooklying with red band around knees- right hip falls outs for 2 sets of 10 reps with cues for slow, controlled movements. Assist to stabilize left LE needed during right LE movements.    Other Supine Knee/Hip Exercises in hooklying at edge of mat table: with right knee flexed had pt lower foot to floor<>back up to mat for 10 reps with assist to maintain knee flexion and for movements of lowering, then lifting back up to mat.      Knee/Hip Exercises: Prone   Hamstring Curl 1 set;10 reps;Limitations    Hamstring Curl Limitations AA with right side with increased assist needed to get through tone at start, then less assist needed through remainder of range of  motion    Hip Extension AAROM;1 set;10 reps;Limitations;Right    Hip Extension Limitations with toes on mat, lifting knee up into extension with assit to fully clear knee off mat    Straight Leg Raises AAROM;Both;Strengthening;1 set;10 reps;Limitations    Straight Leg Raises Limitations AA with both sides, right > left with assist for lifting and controlled lowering back to mat    Other Prone Exercises glut kick backs: AA right > left LE for 10 reps each. assist to maintain knee flexion, clear mat and for controlled lowering of thigh back to mat table.                PT Short Term Goals - 01/22/21 1425      PT SHORT TERM GOAL #1   Title Patient to demo initial HEP back to PT w/o need of cuing    Baseline initial HEP issued today; 01/22/21 HEP updated today, initial HEP demoed by patient    Time 4    Period Weeks    Status Achieved    Target Date 01/16/21      PT SHORT TERM GOAL #2   Title patine to able to demo stand pivot and STS transfers with S    Baseline able to perform stand pivot and STS transfers with CGA; 01/22/21 Able to perform STS transfer with UE support and only S    Time 4    Period Weeks    Status Achieved    Target Date 01/16/21      PT SHORT TERM GOAL #3   Title patient able to ambulate 383f aross level surfaces using RW and light CGA    Baseline 1165fwith RW across level surfaces with CGA and WC follow; 01/22/21 Able to ambulate 23026fith RW and SBA/CGA    Time 4    Period Weeks    Status On-going    Target Date 01/16/21      PT SHORT TERM GOAL #4   Title patient to demo I in bed mobility with focus on control of RLE    Baseline requires CGA to transition from sit/supine needing to scoop RLE onto bed using LLE; 01/22/21 PAtient I in bed mobility accomodating for RLE weakness appropriately    Time 4    Period Weeks    Status Achieved    Target Date 01/16/21      PT SHORT TERM GOAL #5   Title Assess BERG and set appropriate goal    Baseline Berg  performed and LTG written    Time 4    Period Weeks    Status Achieved    Target Date 01/16/21  PT Long Term Goals - 01/31/21 1700      PT LONG TERM GOAL #1   Title Pt will increase Berg score from 25/56 to >30/56 for improved balance and decreased fall risk. (All LTGs due 02/13/21)    Baseline 12/29/20 25/56    Time 8    Period Weeks    Status On-going      PT LONG TERM GOAL #2   Title Ambulate 546f with LRAD across level and unlevel ground with S    Baseline 1160fwith RW across level ground with CGA    Time 8    Period Weeks    Status On-going      PT LONG TERM GOAL #3   Title improve RLE strength throughout from 2 to 3/5 to 3+/5 to allow participatin in functional tasks    Baseline 2 to 3/5 RLE sterngth    Time 8    Period Weeks    Status On-going      PT LONG TERM GOAL #4   Title Patient to demo I in all transfers    Baseline CGA stand pivot and STS transfers    Time 8    Period Weeks    Status On-going      PT LONG TERM GOAL #5   Title patient will ambulate room to room distances with LRAd under S    Baseline 11551fith RW and CGA    Time 8    Period Weeks    Status On-going                 Plan - 01/31/21 1237    Clinical Impression Statement Today's skilled session continued to focus on LE strengthening and gait with RW. Pt with increased incidence of right LE tone with ex's and gait limiting hip/knee flexion. Pt advised to discuss with is Dr. LovArma Heading his next appt this coming Friday. The pt is making progress and should benefit from continued PT to progress toward unmet goals.    Personal Factors and Comorbidities Comorbidity 1    Comorbidities disease process    Examination-Activity Limitations Bed Mobility;Locomotion Level;Transfers;Continence;Toileting    Examination-Participation Restrictions Driving    Stability/Clinical Decision Making Stable/Uncomplicated    Rehab Potential Good    PT Frequency 2x / week    PT Duration 8  weeks    PT Treatment/Interventions ADLs/Self Care Home Management;Aquatic Therapy;Electrical Stimulation;DME Instruction;Gait training;Stair training;Functional mobility training;Therapeutic activities;Therapeutic exercise;Balance training;Neuromuscular re-education;Manual techniques;Wheelchair mobility training;Orthotic Fit/Training;Patient/family education    PT Next Visit Plan continue to work on LE strengtheing with emphasis on right LE- supine, quadruped and tall kneeling; gait training with RW using modified AFO technique facilitate R hip flexors    PT Home Exercise Plan [id number] Consulted and Agree with Plan of Care Patient           Patient will benefit from skilled therapeutic intervention in order to improve the following deficits and impairments:  Abnormal gait,Decreased range of motion,Difficulty walking,Decreased endurance,Decreased activity tolerance,Decreased balance,Decreased mobility,Decreased strength  Visit Diagnosis: Unsteadiness on feet  Muscle weakness (generalized)  Other abnormalities of gait and mobility     Problem List Patient Active Problem List   Diagnosis Date Noted  . 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[place]CC[place]3/07/2021  . Neurogenic bladder 12/08/2020  . Neurogenic bowel 12/08/2020  . Paraplegia following spinal cord injury ([place]CC[place]3/07/2021  . Wheelchair dependence 12/08/2020  . Spasticity 12/08/2020  . Chronic bilateral thoracic back pain 12/08/2020   KatWillow OraTA, CLT Outpatient  Neuro [facility] [address] [place], [place] [address] [phone] 02/01/21, 2:16 PM   Name: LOWRY BENTHALL MRN: [id number] Date of Birth: Dec 13, 1965

## 2021-02-02 ENCOUNTER — Other Ambulatory Visit: Payer: Self-pay

## 2021-02-02 ENCOUNTER — Encounter
Payer: BC Managed Care – PPO | Attending: Physical Medicine and Rehabilitation | Admitting: Physical Medicine and Rehabilitation

## 2021-02-02 ENCOUNTER — Encounter: Payer: Self-pay | Admitting: Physical Medicine and Rehabilitation

## 2021-02-02 VITALS — BP 176/100 | HR 72 | Temp 98.9°F

## 2021-02-02 DIAGNOSIS — G8220 Paraplegia, unspecified: Secondary | ICD-10-CM | POA: Diagnosis present

## 2021-02-02 DIAGNOSIS — Z993 Dependence on wheelchair: Secondary | ICD-10-CM | POA: Insufficient documentation

## 2021-02-02 DIAGNOSIS — K592 Neurogenic bowel, not elsewhere classified: Secondary | ICD-10-CM | POA: Diagnosis present

## 2021-02-02 DIAGNOSIS — G373 Acute transverse myelitis in demyelinating disease of central nervous system: Secondary | ICD-10-CM | POA: Insufficient documentation

## 2021-02-02 DIAGNOSIS — R252 Cramp and spasm: Secondary | ICD-10-CM | POA: Insufficient documentation

## 2021-02-02 MED ORDER — TIZANIDINE HCL 4 MG PO TABS: 2.0000 mg | ORAL_TABLET | Freq: Two times a day (BID) | ORAL | 5 refills | Status: DC

## 2021-02-02 NOTE — Progress Notes (Signed)
Subjective:    Patient ID: Maurice Cruz, male    DOB: 02-Apr-1966, 55 y.o.   MRN: [id number]  HPI   Pt is a 55 yr old R handed male with hx of HTN who developed transverse myelitis d'xd in 7/21. With neurogenic bowel and bladder and spasticity-   S/P steroid IV and IVIG- no plasmapheresis.   Pt here for f/u on transverse myelitis and bowel/bladder.    Good on Tramadol- doesn't take regularly- last pill 2 weeks ago.   Filed for disability- but working on that.   Spasticity/spasms a little better- was nauseated and lightheaded on Baclofen 10 mg nightly.  Taking 5 mg just at night -restarted- no nausea/etc.   Was asking about Botox for R knee area   Cannot seem to bend the knee- it's tight/weak and pops every time does his own exercises.   Didn't increase Magnesium.   Bowel- Senna saw something get better, but now back to an issue-  Has increased Senna to 4 tabs/day.  Has had  A BM today, and Tuesday and then prior Wednesday.  Decreased fiber to 2 Colace and 2 fiber-  Will NOT come out- cannot tell needs to go until the last minute- and is bowel urgency.    Was told had carcinoma? in February based on colonoscopy- but anorectal exam/biopsy under anesthesia was done and was told "ok" at this point.   Pain Inventory Average Pain 3 Pain Right Now 0 My pain is intermittent and aching  LOCATION OF PAIN  Elbow, back, hip, knee  BOWEL Number of stools per week: 1-2 Oral laxative use Yes  Type of laxative colace,fibercon, senna Enema or suppository use No  History of colostomy No  Incontinent No   BLADDER Foley In and out cath, frequency 4 times a day Able to self cath Yes  Bladder incontinence No  Frequent urination No  Leakage with coughing No  Difficulty starting stream Yes  Incomplete bladder emptying Yes    Mobility walk with assistance use a walker how many minutes can you walk? not sure ability to climb steps?  no do you drive?  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  no  Physicians involved in your care Any changes since last visit?  yes Rectal colon doctor and PT   Family History  Problem Relation Age of Onset  . Cancer Maternal Grandmother    Social History   Socioeconomic History  . Marital status: Single    Spouse name: Not on file  . Number of children: Not on file  . Years of education: Not on file  . Highest education level: Not on file  Occupational History  . Not on file  Tobacco Use  . Smoking status: Never Smoker  . Smokeless tobacco: Never Used  Substance and Sexual Activity  . Alcohol use: Not Currently  . Drug use: Never  . Sexual activity: Not Currently  Other Topics Concern  . Not on file  Social History Narrative  . Not on file   Social Determinants of Health   Financial Resource Strain: Not on file  Food Insecurity: Not on file  Transportation Needs: Not on file  Physical Activity: Not on file  Stress: Not on file  Social Connections: Not on file   No past surgical history on file. Past Medical History:  Diagnosis Date  . Anxiety   . Hx of spinal cord injury 03/2020  . Hypertension    Temp 98.9 F (37.2 C)   Opioid Risk Score:   Fall Risk Score:  `1  Depression screen PHQ 2/9  Depression screen PHQ 2/9 12/08/2020  Decreased Interest 3  Down, Depressed, Hopeless 3  PHQ - 2 Score 6  Altered sleeping 1  Tired, decreased energy 1  Change in appetite 2  Feeling bad or failure about yourself  3  Trouble concentrating 0  Moving slowly or fidgety/restless 0  Suicidal thoughts 0  PHQ-9 Score 13  Difficult doing work/chores Very difficult    Family History  Problem Relation Age of Onset   . Cancer Maternal Grandmother    Social History   Socioeconomic History  . Marital status: Single    Spouse name: Not on file  . Number of children: Not on file  . Years of education: Not on file  . Highest education level: Not on file  Occupational History  . Not on file  Tobacco Use  . Smoking status: Never Smoker  . Smokeless tobacco: Never Used  Substance and Sexual Activity  . Alcohol use: Not Currently  . Drug use: Never  . Sexual activity: Not Currently  Other Topics Concern  . Not on file  Social History Narrative  . Not on file   Social Determinants of Health   Financial Resource Strain: Not on file  Food Insecurity: Not on file  Transportation Needs: Not on file  Physical Activity: Not on file  Stress: Not on file  Social Connections: Not on file   No past surgical history on file. No past surgical history on file. Past Medical History:  Diagnosis Date  . Anxiety   . Hx of spinal cord injury 03/2020  . Hypertension    There were no vitals taken for this visit.  Opioid Risk Score:   Fall Risk Score:  `1  Depression screen PHQ 2/9  Depression screen PHQ 2/9 12/08/2020  Decreased Interest 3  Down, Depressed, Hopeless 3  PHQ - 2 Score 6  Altered sleeping 1  Tired, decreased energy 1  Change in appetite 2  Feeling bad or failure about yourself  3  Trouble concentrating 0  Moving slowly or fidgety/restless 0  Suicidal thoughts 0  PHQ-9 Score 13  Difficult doing work/chores Very difficult    Review of Systems  Gastrointestinal: Positive for constipation.  Musculoskeletal: Positive for gait problem.       Spasms  Neurological: Positive for weakness.  Psychiatric/Behavioral: Positive for dysphoric mood. The patient is nervous/anxious.   All other systems reviewed and are negative.      Objective:   Physical Exam BP 176/100-   Awake, alert, appropriate, in manual w/c, NAD MS: Strength exam RLE: HF 1/5, KE 2/5, DF 3-/5, and PF 3+/5 LLE:  HF 4/5, KE 4/5, and DF and PF 5/5   Neuro: RLE- Mas 1+ to 2 in R hip and knee; less in R ankle LLE- MAS of 1 in LLE hip/knee Few beats clonus LLE and RLE      Assessment & Plan:    Pt is a 55 yr old R handed male with hx of HTN who developed transverse myelitis d'xd in 7/21. With neurogenic bowel and bladder and spasticity-   S/P steroid IV and IVIG- no plasmapheresis. BP is very elevated!  176/100  Pt here for f/u on transverse myelitis and bowel/bladder. Mild improvement in RLE mainly in ankle/foot.    1. 1. Neurogenic bowel- Bowel program for SCI patients (BP)   1. Use Dulcolax suppositories- every day or other every day.  2. Start with doing bowel program within 1 hour after a meal- breakfast or dinner 3. Take bowels meds -Po/oral at opposite time doing bowel program- if BP (bowel program) in evening, give PO meds in AM; and vice versa. Wait on this   4. Start with digital stimulation- up to 2nd knuckle- stimulate rectum x 30 seconds 5. Place suppository- wait 10 minutes 6. Do Dig stim/Digital stimulation) every 10-15 minutes until empty- usually takes 30-40 minutes in most cases.  7. If gets severely constipated, >4 days, can use Magnesium Citrate 1/2 to 1 full bottle-  and follow in 4 hours with suppository or any type of enema.  8. Takes 6 weeks or so to get bowel to have a habit.    2. Padded toilet seat might be helpful.   3. Spasticity- Stop Baclofen- if we did Botox   4. Spasticity- Start Tizanidine- 2-4 mg 1-2x/day.  Per pt- has some dynamic spasticity, however I also think the reason he cannot bring R knee forward is the weakness.   5. BP uncontrolled- 176/100- keep an eye on it- and get it treated if needed.   6. Doing pressure relief! Please continue every 15-20 minutes to prevent pressure ulcer.    7. F/U in 2 months, but can call if Tizanidine isn't working! Or side effects.   I spent a total of 30 minutes on visit- discussing/educating on bowel program,  Zanaflex vs Botox and why don't think Botox would be appropriate right now.

## 2021-02-02 NOTE — Patient Instructions (Signed)
  Pt is a 55 yr old R handed male with hx of HTN who developed transverse myelitis d'xd in 7/21. With neurogenic bowel and bladder and spasticity-   S/P steroid IV and IVIG- no plasmapheresis. BP is very elevated! 176/100  Pt here for f/u on transverse myelitis and bowel/bladder. Mild improvement in RLE mainly in ankle/foot.    1. 1. Neurogenic bowel- Bowel program for SCI patients (BP)   1. Use Dulcolax suppositories- every day or other every day.  2. Start with doing bowel program within 1 hour after a meal- breakfast or dinner 3. Take bowels meds -Po/oral at opposite time doing bowel program- if BP (bowel program) in evening, give PO meds in AM; and vice versa. Wait on this   4. Start with digital stimulation- up to 2nd knuckle- stimulate rectum x 30 seconds 5. Place suppository- wait 10 minutes 6. Do Dig stim/Digital stimulation) every 10-15 minutes until empty- usually takes 30-40 minutes in most cases.  7. If gets severely constipated, >4 days, can use Magnesium Citrate 1/2 to 1 full bottle-  and follow in 4 hours with suppository or any type of enema.  8. Takes 6 weeks or so to get bowel to have a habit.    2. Padded toilet seat might be helpful.   3. Spasticity- Stop Baclofen- if we did Botox   4. Spasticity- Start Tizanidine- 2-4 mg 1-2x/day.  Per pt- has some dynamic spasticity, however I also think the reason he cannot bring R knee forward is the weakness.   5. BP uncontrolled- 176/100- keep an eye on it- and get it treated if needed.   6. Doing pressure relief! Please continue every 15-20 minutes to prevent pressure ulcer.    7. F/U in 2 months, but can call if Tizinidine isn't working! Or side effects.

## 2021-02-05 ENCOUNTER — Ambulatory Visit: Payer: BC Managed Care – PPO | Admitting: Neurology

## 2021-02-05 ENCOUNTER — Encounter: Payer: Self-pay | Admitting: Neurology

## 2021-02-05 ENCOUNTER — Telehealth: Payer: Self-pay | Admitting: *Deleted

## 2021-02-05 ENCOUNTER — Other Ambulatory Visit: Payer: Self-pay

## 2021-02-05 ENCOUNTER — Ambulatory Visit: Payer: BC Managed Care – PPO

## 2021-02-05 VITALS — BP 144/92 | HR 75 | Ht 70.0 in | Wt 179.5 lb

## 2021-02-05 DIAGNOSIS — M6281 Muscle weakness (generalized): Secondary | ICD-10-CM

## 2021-02-05 DIAGNOSIS — N319 Neuromuscular dysfunction of bladder, unspecified: Secondary | ICD-10-CM | POA: Diagnosis not present

## 2021-02-05 DIAGNOSIS — G373 Acute transverse myelitis in demyelinating disease of central nervous system: Secondary | ICD-10-CM | POA: Diagnosis not present

## 2021-02-05 DIAGNOSIS — R2681 Unsteadiness on feet: Secondary | ICD-10-CM

## 2021-02-05 DIAGNOSIS — R29898 Other symptoms and signs involving the musculoskeletal system: Secondary | ICD-10-CM | POA: Diagnosis not present

## 2021-02-05 DIAGNOSIS — R252 Cramp and spasm: Secondary | ICD-10-CM

## 2021-02-05 DIAGNOSIS — K592 Neurogenic bowel, not elsewhere classified: Secondary | ICD-10-CM

## 2021-02-05 DIAGNOSIS — R2689 Other abnormalities of gait and mobility: Secondary | ICD-10-CM

## 2021-02-05 MED ORDER — TAMSULOSIN HCL 0.4 MG PO CAPS: 0.4000 mg | ORAL_CAPSULE | Freq: Every day | ORAL | 5 refills | Status: DC

## 2021-02-05 NOTE — Progress Notes (Signed)
[facility]  PATIENT: Maurice Cruz DOB: 06-29-1966  REFERRING DOCTOR OR PCP: Chales Salmon, MD (neurology); Glendon Axe, MD (PCP) SOURCE: Patient, notes from Dr. Jannifer Franklin, imaging and laboratory reports.  _________________________________   HISTORICAL  CHIEF COMPLAINT:  Chief Complaint  Patient presents with  . New Patient (Initial Visit)    RM 22 w/ father, Maurice Cruz. Paper referral from Chales Salmon, MD for Myelitis, idiopathic transverse. Moved from [place]. Lives w/ parents. Sent here for 2nd opinion for treatment. In [place]. Able to stand to obtain weight. Does not have MRI CD w/ him. Imaging done at [facility] in [place] .Reports in careeverywhere.     HISTORY OF PRESENT ILLNESS:  I had the pleasure of seeing your patient, Maurice Cruz, at the [place] [facility] for neurologic consultation regarding his history of transverse myelitis.  He is a 55 year old man who presented with low back pain followed by leg weakness and numbness in July 2021.  Symptoms developed over minutes to an hour.   By the time the paramedics arrived, he could not move his legs.   The numbness ws not loss of sensation but his legs felt different.  His local hospital could not do MRI (CT was fine) so he was sent to [facility] in [place].  He had MRIs and lumbar punctures.  He was treated with 5 days of IV Solu-Medrol and IVIG but did not have benefit.  He was then discharged to [facility] rehab and then spent a month there.  He felt he improved some.      He moved to [place] in November.  He was referred to Dr. Jannifer Franklin on 10/20/2020.  She has referred him to see me for a second opinion regarding the possibility of MS.  Currently, his left leg is only mildly weak (70% strength) but the right leg is still very weak (25%).  Sensation improved but not to baseline.   He denies painful dysesthesias currently though he will sometimes experience these in the thoracic spine.Marland Kitchen    He is doing PT at [facility].   With a walker, he can walk > 100 feet without a rest.  He has spasticity and he noted mild benefit but had lightheadedness.  He has needed to self-cath since the onset of symptoms.  Bowel function is poor even with stool softeners and Miralax and lactulose.   Due to continued bowel issues, he sometimes needs Dulcolax or Mg Citrate.    He is taking tizanidine for spasticity (new) but had difficulty tolerating baclofen at the same time as escitalopram.     He notes some depression and is on Prozac (was on Lexapro).    He sleeps ok when he takes a muscle relaxant).     He was working in [employer] for [employer] but haven't been able to return due to inability to sit prolonged periods of time.     Imaging reports (images have been requested): MRI of the thoracic spine without contrast 04/15/2020 showed segmental hyperintensity within the spinal cord at T6-T7 through T9 and from T9-T10 through T11.  MRI of the thoracic spine with contrast 04/16/2020 showed enhancement that corresponded with the hyperintense changes seen on the 04/15/2020 MRI.  There was also a spot of hypointense changes at T9.  MRI of the cervical spine 04/16/2020 was essentially normal.  MRI of the brain 04/16/2020 was normal.  MRI of the lumbar spine 04/15/2020 showed mild degenerative changes but the conus medullaris appears  normal.   Laboratory: CSF 04/17/2020 showed greater than 5 bands that were all observed in both CSF and serum (seen with systemic rather than intracerebral synthesis of gammaglobulins).  CSF protein was slightly elevated at 47.  Other laboratory test 04/15/2020 showed negative HIV, negative RPR, normal vitamin B12.  Hemoglobin A1c was 6.1.  REVIEW OF SYSTEMS: Constitutional: No fevers, chills, sweats, or change in appetite Eyes: No visual changes, double vision, eye pain Ear, nose and throat: No hearing loss, ear pain, nasal congestion, sore throat Cardiovascular: No chest pain,  palpitations Respiratory: No shortness of breath at rest or with exertion.   No wheezes GastrointestinaI: Severe constipation. Genitourinary:Urinary retention. Musculoskeletal: No neck pain, back pain Integumentary: No rash, pruritus, skin lesions Neurological: as above Psychiatric: No depression at this time.  No anxiety Endocrine: No palpitations, diaphoresis, change in appetite, change in weigh or increased thirst Hematologic/Lymphatic: No anemia, purpura, petechiae. Allergic/Immunologic: No itchy/runny eyes, nasal congestion, recent allergic reactions, rashes  ALLERGIES: No Known Allergies  HOME MEDICATIONS:  Current Outpatient Medications:  .  amLODipine (NORVASC) 10 MG tablet, Take 10 mg by mouth daily., Disp: , Rfl:  .  carvedilol (COREG) 12.5 MG tablet, Take 12.5 mg by mouth 2 (two) times daily with a meal., Disp: , Rfl:  .  cyclobenzaprine (FLEXERIL) 10 MG tablet, Take 10 mg by mouth 3 (three) times daily as needed for muscle spasms., Disp: , Rfl:  .  docusate sodium (COLACE) 100 MG capsule, Take 100 mg by mouth 2 (two) times daily., Disp: , Rfl:  .  FLUoxetine (PROZAC) 10 MG capsule, Take 1 capsule (10 mg total) by mouth daily. Take 1 tab daily for 2 weeks, then if tolerating, can increase to 2 tabs daily. - for mood, Disp: 60 capsule, Rfl: 5 .  lactulose (CHRONULAC) 10 GM/15ML solution, Take 10 g by mouth 3 (three) times daily., Disp: , Rfl:  .  Magnesium 250 MG TABS, Take 250 mg by mouth., Disp: , Rfl:  .  Multiple Vitamins-Minerals (CENTRUM MEN PO), Take by mouth., Disp: , Rfl:  .  pantoprazole (PROTONIX) 40 MG tablet, Take 40 mg by mouth daily., Disp: , Rfl:  .  polycarbophil (FIBERCON) 625 MG tablet, Take 625 mg by mouth daily., Disp: , Rfl:  .  Polyethylene Glycol 3350 POWD, 3,350 Scoops by Does not apply route. daily, Disp: , Rfl:  .  rosuvastatin (CRESTOR) 5 MG tablet, Take 5 mg by mouth daily., Disp: , Rfl:  .  senna (SENOKOT) 8.6 MG TABS tablet, Take 4 tablets by  mouth daily., Disp: , Rfl:  .  tamsulosin (FLOMAX) 0.4 MG CAPS capsule, Take 1 capsule (0.4 mg total) by mouth daily., Disp: 30 capsule, Rfl: 5 .  tiZANidine (ZANAFLEX) 4 MG tablet, Take 0.5-1 tablets (2-4 mg total) by mouth 2 (two) times daily., Disp: 60 tablet, Rfl: 5 .  traMADol (ULTRAM) 50 MG tablet, Take 1 tablet (50 mg total) by mouth every 6 (six) hours as needed., Disp: 120 tablet, Rfl: 5 .  vitamin C (ASCORBIC ACID) 500 MG tablet, Take 500 mg by mouth daily., Disp: , Rfl:   PAST MEDICAL HISTORY: Past Medical History:  Diagnosis Date  . Anxiety   . Hx of spinal cord injury 03/2020  . Hypertension     PAST SURGICAL HISTORY: History reviewed. No pertinent surgical history.  FAMILY HISTORY: Family History  Problem Relation Age of Onset  . Cancer Maternal Grandmother     SOCIAL HISTORY:  Social History   Socioeconomic History  .  Marital status: Single    Spouse name: Not on file  . Number of children: 0  . Years of education: HS  . Highest education level: Not on file  Occupational History  . Occupation: On disability  Tobacco Use  . Smoking status: Never Smoker  . Smokeless tobacco: Never Used  Substance and Sexual Activity  . Alcohol use: Not Currently  . Drug use: Never  . Sexual activity: Not Currently  Other Topics Concern  . Not on file  Social History Narrative   Right handed   1 glass tea per day   Coffee sometimes    No Soda   Lives with parents.   Social Determinants of Health   Financial Resource Strain: Not on file  Food Insecurity: Not on file  Transportation Needs: Not on file  Physical Activity: Not on file  Stress: Not on file  Social Connections: Not on file  Intimate Partner Violence: Not on file     PHYSICAL EXAM  Vitals:   02/05/21 1019  BP: (!) 144/92  Pulse: 75  Weight: 179 lb 8 oz (81.4 kg)  Height: '5\' 10"'$  (1.778 m)    Body mass index is 25.76 kg/m.   General: The patient is well-developed and well-nourished and  in no acute distress  HEENT:  Head is /AT.  Sclera are anicteric.  Funduscopic exam shows normal optic discs and retinal vessels.  Visual acuity was symmetric.  Color vision was symmetric.  Neck: No carotid bruits are noted.  The neck is nontender.  Cardiovascular: The heart has a regular rate and rhythm with a normal S1 and S2. There were no murmurs, gallops or rubs.    Skin: Extremities are without rash or  edema.  Musculoskeletal:  Back is nontender  Neurologic Exam  Mental status: The patient is alert and oriented x 3 at the time of the examination. The patient has apparent normal recent and remote memory, with an apparently normal attention span and concentration ability.   Speech is normal.  Cranial nerves: Extraocular movements are full. Pupils are equal, round, and reactive to light and accomodation.   Facial symmetry is present. There is good facial sensation to soft touch bilaterally.Facial strength is normal.  Trapezius and sternocleidomastoid strength is normal. No dysarthria is noted.  The tongue is midline, and the patient has symmetric elevation of the soft palate. No obvious hearing deficits are noted.  Motor:  Muscle bulk is normal.   Tone is normal in the arms and increased in the right leg.. Strength is  5 / 5 in the arms, 4 -/5 in the left iliopsoas and 4/5 elsewhere in the left leg.  Strength was 1/5 in the right iliopsoas and 2 -/5 to 2/5 elsewhere in the right leg.  Sensory: Sensory testing is intact to pinprick, soft touch and vibration sensation in the arms.  He has reduced sensation to touch/temperature below T9 on the right and below T11 on the left.  Normal vibration sensation.  Coordination: Cerebellar testing reveals good finger-nose-finger and unable to do a right leg) left leg.  Gait and station: Station is normal.  He needs support to stand.  Reflexes: Deep tendon reflexes are increased in the legs, nonsustained clonus at the ankles.  Plantar responses are  equivocal.      ASSESSMENT AND PLAN  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HCC) - Plan: Neuromyelitis optica autoab, IgG, Anti-MOG, Serum, Pan-ANCA, ANA w/Reflex  Weakness of both lower extremities  Neurogenic bowel  Neurogenic bladder  Spasticity  In summary, Maurice Cruz is a 55 year old man with transverse myelitis that developed July 2021.  At the time, MRI of the thoracic spine showed an enhancing lesion.  By report, there were 2 adjacent foci.  I will try to review the actual images if they become available.  Because it was enhancing and symptoms developed over an hour or so, this is more consistent with an inflammatory or postinfectious etiology.  A spinal cord infarction is less likely.  Unfortunately, recovery has only been partial.  We will check anti-NMO, anti-MOG, ANCA and ANA to determine if there is evidence of neuromyelitis optica (NMOSD) or vasculitis.  If all the testing is negative, this is more likely to represent a postinfectious etiology.  He is able to urinate some but still requires intermittent catheterization, I will have him try Flomax to see if this can improve.  Later in the year, we will check an MRI of the brain and cervical/thoracic spine to determine if there has been progression.  If present, then consider multiple sclerosis as well.  He is advised to stay active.  He is doing physical therapy.  Dysesthesias currently are not too bad but if they worsen consider gabapentin or lamotrigine.  He will return to see me in 3 months or sooner based on the results of testing or if there are new or worsening neurologic symptoms.   Maurice Cruz A. [name], MD, [facility] [id number], [id number] PM Certified in Neurology, Clinical Neurophysiology, Sleep Medicine and Neuroimaging  [facility] [address] [place], [place] [address] [phone]

## 2021-02-05 NOTE — Telephone Encounter (Signed)
Request faxed to [facility] [place] [phone]

## 2021-02-05 NOTE — Therapy (Signed)
[facility] [address] [place], [place], [address] Phone: [phone]   Fax:  [phone]  Physical Therapy Treatment  Patient Details  Name: Maurice Cruz MRN: [id number] Date of Birth: 03/30/1966 Referring Provider (PT): Dr Courtney Heys   Encounter Date: 02/05/2021   PT End of Session - 02/05/21 1209    Visit Number 14    Number of Visits 17    Date for PT Re-Evaluation 02/13/21    Authorization Type BCBS    PT Start Time 1230    PT Stop Time 1315    PT Time Calculation (min) 45 min    Equipment Utilized During Treatment Gait belt    Activity Tolerance Patient tolerated treatment well;No increased pain    Behavior During Therapy WFL for tasks assessed/performed           Past Medical History:  Diagnosis Date  . Anxiety   . Hx of spinal cord injury 03/2020  . Hypertension     History reviewed. No pertinent surgical history.  There were no vitals filed for this visit.   Subjective Assessment - 02/05/21 1208    Subjective No new complaitns. No falls or pain to report. HEP is going well at home. Feels t-band KAFO helps had a f/u with Neuro, blood work in progress, NP did not feel he was a candidate for botox    Patient is accompained by: Family member   dad   Pertinent History Began to notice symptoms in 11/21, had 2 week hospital stay followed by 1 mo. at [facility] in [place].  Was receiving OPPT at [facility] for LE strengthening however referring MD recommended this clinic, has since moved in with parents for physical as well as financial assistance and has obtained and installed a chair lift, denies LE pain but has a hx of low back pain worse with prolonged sitting    How long can you sit comfortably? 30 min    How long can you stand comfortably? <5 min    How long can you walk comfortably? <5 min    Currently in Pain? No/denies                             [facility] Adult PT  Treatment/Exercise - 02/05/21 0001      Transfers   Transfers Sit to Stand    Sit to Stand 5: Supervision;4: Min guard    Sit to Stand Details (indicate cue type and reason) good safety awareness and WC management    Stand to Sit 5: Supervision      Ambulation/Gait   Ambulation/Gait Yes    Ambulation/Gait Assistance 4: Min guard    Ambulation/Gait Assistance Details t-band KAFO    Ambulation Distance (Feet) 230 Feet    Assistive device Rolling walker    Gait Pattern Step-to pattern    Ambulation Surface Level;Indoor    Gait Comments facilitated WS to L to enable R swing through          Todays session required extra time to fabricate KAFO out of t-band as well as time to recover b/t tasks due to fatigue encountered from prolonged standing and WB activities.  Tactile cuing needed to facilitate RLE through swing phase       Balance Exercises - 02/05/21 0001      Balance Exercises: Standing   Retro Gait Upper extremity support;5 reps;Limitations    Retro Gait Limitations performed in //  bars 5 trips    Other Standing Exercises L foot on Airex with t-band KAFO swinging RLE fwd with mild TCs 15x               PT Short Term Goals - 01/22/21 1425      PT SHORT TERM GOAL #1   Title Patient to demo initial HEP back to PT w/o need of cuing    Baseline initial HEP issued today; 01/22/21 HEP updated today, initial HEP demoed by patient    Time 4    Period Weeks    Status Achieved    Target Date 01/16/21      PT SHORT TERM GOAL #2   Title patine to able to demo stand pivot and STS transfers with S    Baseline able to perform stand pivot and STS transfers with CGA; 01/22/21 Able to perform STS transfer with UE support and only S    Time 4    Period Weeks    Status Achieved    Target Date 01/16/21      PT SHORT TERM GOAL #3   Title patient able to ambulate 360f aross level surfaces using RW and light CGA    Baseline 1129fwith RW across level surfaces with CGA and WC  follow; 01/22/21 Able to ambulate 23081fith RW and SBA/CGA    Time 4    Period Weeks    Status On-going    Target Date 01/16/21      PT SHORT TERM GOAL #4   Title patient to demo I in bed mobility with focus on control of RLE    Baseline requires CGA to transition from sit/supine needing to scoop RLE onto bed using LLE; 01/22/21 PAtient I in bed mobility accomodating for RLE weakness appropriately    Time 4    Period Weeks    Status Achieved    Target Date 01/16/21      PT SHORT TERM GOAL #5   Title Assess BERG and set appropriate goal    Baseline Berg performed and LTG written    Time 4    Period Weeks    Status Achieved    Target Date 01/16/21             PT Long Term Goals - 01/31/21 1700      PT LONG TERM GOAL #1   Title Pt will increase Berg score from 25/56 to >30/56 for improved balance and decreased fall risk. (All LTGs due 02/13/21)    Baseline 12/29/20 25/56    Time 8    Period Weeks    Status On-going      PT LONG TERM GOAL #2   Title Ambulate 500f28fth LRAD across level and unlevel ground with S    Baseline 115ft76fh RW across level ground with CGA    Time 8    Period Weeks    Status On-going      PT LONG TERM GOAL #3   Title improve RLE strength throughout from 2 to 3/5 to 3+/5 to allow participatin in functional tasks    Baseline 2 to 3/5 RLE sterngth    Time 8    Period Weeks    Status On-going      PT LONG TERM GOAL #4   Title Patient to demo I in all transfers    Baseline CGA stand pivot and STS transfers    Time 8    Period Weeks    Status On-going  PT LONG TERM GOAL #5   Title patient will ambulate room to room distances with LRAd under S    Baseline 182f with RW and CGA    Time 8    Period Weeks    Status On-going                 Plan - 02/05/21 1210    Clinical Impression Statement Todays skilled session consisted of gait training with t-band KAFO followed by seated heel slides and retrowalking in // bars, added RLE  swingthrough task with LLE standing on Airex to facilitae hip flexion in a functional position.  All standing tasks performed wearing t-band KAFO.    Personal Factors and Comorbidities Comorbidity 1    Comorbidities disease process    Examination-Activity Limitations Bed Mobility;Locomotion Level;Transfers;Continence;Toileting    Examination-Participation Restrictions Driving    Stability/Clinical Decision Making Stable/Uncomplicated    Rehab Potential Good    PT Frequency 2x / week    PT Duration 8 weeks    PT Treatment/Interventions ADLs/Self Care Home Management;Aquatic Therapy;Electrical Stimulation;DME Instruction;Gait training;Stair training;Functional mobility training;Therapeutic activities;Therapeutic exercise;Balance training;Neuromuscular re-education;Manual techniques;Wheelchair mobility training;Orthotic Fit/Training;Patient/family education    PT Next Visit Plan continue to work on LE strengtheing with emphasis on right LE flexion- supine, quadruped and tall kneeling; gait training with RW using modified AFO technique facilitate R hip flexors    PT Home Exercise Plan [id number]   Consulted and Agree with Plan of Care Patient           Patient will benefit from skilled therapeutic intervention in order to improve the following deficits and impairments:  Abnormal gait,Decreased range of motion,Difficulty walking,Decreased endurance,Decreased activity tolerance,Decreased balance,Decreased mobility,Decreased strength  Visit Diagnosis: Unsteadiness on feet  Muscle weakness (generalized)  Other abnormalities of gait and mobility  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HSan Patricio     Problem List Patient Active Problem List   Diagnosis Date Noted  . 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 Neurogenic bladder 12/08/2020  . Neurogenic bowel 12/08/2020  . Paraplegia following spinal cord injury ([place]op[place] 12/08/2020  . Wheelchair dependence 12/08/2020  . Spasticity 12/08/2020  . Chronic bilateral  thoracic back pain 12/08/2020    JLanice ShirtsPT 02/05/2021, 1:47 PM  [address] [address]Phone: [phone]  Fax:  [phone] Name: MLESLIE DESCHENESMRN: [id number] of Birth: 112/08/1966

## 2021-02-07 ENCOUNTER — Encounter: Payer: Self-pay | Admitting: Physical Therapy

## 2021-02-07 ENCOUNTER — Other Ambulatory Visit: Payer: Self-pay

## 2021-02-07 ENCOUNTER — Ambulatory Visit: Payer: BC Managed Care – PPO | Admitting: Physical Therapy

## 2021-02-07 DIAGNOSIS — R2681 Unsteadiness on feet: Secondary | ICD-10-CM

## 2021-02-07 DIAGNOSIS — M6281 Muscle weakness (generalized): Secondary | ICD-10-CM

## 2021-02-07 DIAGNOSIS — R2689 Other abnormalities of gait and mobility: Secondary | ICD-10-CM

## 2021-02-07 LAB — ENA+DNA/DS+SJORGEN'S
ENA RNP Ab: 6.5 AI — ABNORMAL HIGH (ref 0.0–0.9)
ENA SM Ab Ser-aCnc: <0.2 AI (ref 0.0–0.9)
ENA SSA (RO) Ab: <0.2 AI (ref 0.0–0.9)
ENA SSB (LA) Ab: 0.4 AI (ref 0.0–0.9)
dsDNA Ab: <1 IU/mL (ref 0–9)

## 2021-02-07 LAB — ANTI-MOG, SERUM: MOG Antibody, Cell-based IFA: NEGATIVE

## 2021-02-07 LAB — PAN-ANCA
ANCA Proteinase 3: <3.5 U/mL (ref 0.0–3.5)
Atypical pANCA: <1:20 {titer}
C-ANCA: <1:20 {titer}
Myeloperoxidase Ab: <9 U/mL (ref 0.0–9.0)
P-ANCA: <1:20 {titer}

## 2021-02-07 LAB — ANA W/REFLEX: Anti Nuclear Antibody (ANA): POSITIVE — AB

## 2021-02-07 LAB — NEUROMYELITIS OPTICA AUTOAB, IGG: NMO IgG Autoantibodies: 1.6 U/mL (ref 0.0–3.0)

## 2021-02-07 NOTE — Therapy (Signed)
[facility] [address] [place], [place], [address] Phone: [phone]   Fax:  [phone]  Physical Therapy Treatment  Patient Details  Name: Maurice Cruz MRN: [id number] Date of Birth: 08-16-1966 Referring Provider (PT): Dr Courtney Heys   Encounter Date: 02/07/2021   PT End of Session - 02/07/21 1240    Visit Number 15    Number of Visits 17    Date for PT Re-Evaluation 02/13/21    Authorization Type BCBS- 60 visit limit (has used 20    Authorization - Visit Number 15    Authorization - Number of Visits 40    PT Start Time [id number]    PT Stop Time 1315    PT Time Calculation (min) 40 min    Equipment Utilized During Treatment Gait belt    Activity Tolerance Patient tolerated treatment well;No increased pain    Behavior During Therapy WFL for tasks assessed/performed           Past Medical History:  Diagnosis Date  . Anxiety   . Hx of spinal cord injury 03/2020  . Hypertension     History reviewed. No pertinent surgical history.  There were no vitals filed for this visit.   Subjective Assessment - 02/07/21 1237    Subjective Dr. Arma Heading started pt on a medication for spasticity. Dr. [name] also started him an a medication for bladder control. Both are on med list. Dr. [name] working on getting MRI from [place] to better formate a plan. No falls or pain to report. Walking at home with family assitance.    Patient is accompained by: Family member   dad   Pertinent History Began to notice symptoms in 11/21, had 2 week hospital stay followed by 1 mo. at [facility] in [place].  Was receiving OPPT at [facility] for LE strengthening however referring MD recommended this clinic, has since moved in with parents for physical as well as financial assistance and has obtained and installed a chair lift, denies LE pain but has a hx of low back pain worse with prolonged sitting    How long can you sit comfortably? 30 min    How  long can you stand comfortably? <5 min    How long can you walk comfortably? <5 min    Currently in Pain? No/denies                [facility] Adult PT Treatment/Exercise - 02/07/21 1241      Transfers   Transfers Sit to Stand    Sit to Stand 5: Supervision;4: Min guard    Stand to Sit 5: Supervision    Lateral/Scoot Transfers 6: Modified independent (Device/Increase time)    Lateral/Scoot Transfer Details (indicate cue type and reason) wheelchair<>mat table      Ambulation/Gait   Ambulation/Gait Yes    Ambulation/Gait Assistance 4: Min guard    Ambulation/Gait Assistance Details continued with use of green theraband assist for right hip/knee flexion and right DF with gait/ex's in parallel bars (see neuro re-ed section). continued cues needed for increased hip flexion on right with swing phase and for increased step length for reciprocal gait pattern. Pt able to demo this for a few steps before reverting back to step to pattern with cues and facilitation.    Ambulation Distance (Feet) 40 Feet   x2   Assistive device Rolling walker    Gait Pattern Step-to pattern;Decreased stride length;Decreased step length - right;Decreased step length - left;Decreased dorsiflexion -  right;Decreased hip/knee flexion - right;Decreased stance time - right;Decreased weight shift to right;Decreased trunk rotation;Narrow base of support;Poor foot clearance - right    Ambulation Surface Level;Indoor      Neuro Re-ed    Neuro Re-ed Details  for strengtening/NMR: with green band to right knee in parallel bars with bil UE support- forward right heel taps to 4 inch box- assist/facilitation needed for hip/knee flexion/decreased circumduction for each of the 10 reps;  with balance board- left stance, then right stand for stepping over to floor in front/back over to starting position for 10 reps each side. assist needed for right hip/knee flexion with stepping over/back in left stance, then with knee flexion with right  stance on board. Palpable (?) crepetis noted in knee with flexion with this activity that decreased in occurance as reps progressed.      Lumbar Exercises: Supine   Bridge Limitations right single leg bridge with right knee bend/left LE in extension for 2 sets of 10 reps  with cues for increased pelvic lift each rep. no assist needed for right LE stability.    Single Leg Bridge Non-compliant;10 reps;Limitations   2 sets   Other Supine Lumbar Exercises right LE marching for 2 sets of 10 reps AROM with foot clearing mat surface each time; then at edge of mat with both knees bent- lifting right foot off mat, down to floor<>then back up to mat from floor for 2 sets of 10 reps with min/mod assist.               PT Short Term Goals - 01/22/21 1425      PT SHORT TERM GOAL #1   Title Patient to demo initial HEP back to PT w/o need of cuing    Baseline initial HEP issued today; 01/22/21 HEP updated today, initial HEP demoed by patient    Time 4    Period Weeks    Status Achieved    Target Date 01/16/21      PT SHORT TERM GOAL #2   Title patine to able to demo stand pivot and STS transfers with S    Baseline able to perform stand pivot and STS transfers with CGA; 01/22/21 Able to perform STS transfer with UE support and only S    Time 4    Period Weeks    Status Achieved    Target Date 01/16/21      PT SHORT TERM GOAL #3   Title patient able to ambulate 325f aross level surfaces using RW and light CGA    Baseline 1176fwith RW across level surfaces with CGA and WC follow; 01/22/21 Able to ambulate 23073fith RW and SBA/CGA    Time 4    Period Weeks    Status On-going    Target Date 01/16/21      PT SHORT TERM GOAL #4   Title patient to demo I in bed mobility with focus on control of RLE    Baseline requires CGA to transition from sit/supine needing to scoop RLE onto bed using LLE; 01/22/21 PAtient I in bed mobility accomodating for RLE weakness appropriately    Time 4    Period Weeks     Status Achieved    Target Date 01/16/21      PT SHORT TERM GOAL #5   Title Assess BERG and set appropriate goal    Baseline Berg performed and LTG written    Time 4    Period Weeks    Status  Achieved    Target Date 01/16/21             PT Long Term Goals - 01/31/21 1700      PT LONG TERM GOAL #1   Title Pt will increase Berg score from 25/56 to >30/56 for improved balance and decreased fall risk. (All LTGs due 02/13/21)    Baseline 12/29/20 25/56    Time 8    Period Weeks    Status On-going      PT LONG TERM GOAL #2   Title Ambulate 564f with LRAD across level and unlevel ground with S    Baseline 1132fwith RW across level ground with CGA    Time 8    Period Weeks    Status On-going      PT LONG TERM GOAL #3   Title improve RLE strength throughout from 2 to 3/5 to 3+/5 to allow participatin in functional tasks    Baseline 2 to 3/5 RLE sterngth    Time 8    Period Weeks    Status On-going      PT LONG TERM GOAL #4   Title Patient to demo I in all transfers    Baseline CGA stand pivot and STS transfers    Time 8    Period Weeks    Status On-going      PT LONG TERM GOAL #5   Title patient will ambulate room to room distances with LRAd under S    Baseline 1159fith RW and CGA    Time 8    Period Weeks    Status On-going                 Plan - 02/07/21 1919    Clinical Impression Statement Today's skilled session continued to focus on right LE strengthening and gait mechanics with rest breaks taken as needed due to right LE fatigue. No other issues noted or reported in session. The pt is progressing toward goals and should benefit from continued PT to progress toward unmet goals.    Personal Factors and Comorbidities Comorbidity 1    Comorbidities disease process    Examination-Activity Limitations Bed Mobility;Locomotion Level;Transfers;Continence;Toileting    Examination-Participation Restrictions Driving    Stability/Clinical Decision Making  Stable/Uncomplicated    Rehab Potential Good    PT Frequency 2x / week    PT Duration 8 weeks    PT Treatment/Interventions ADLs/Self Care Home Management;Aquatic Therapy;Electrical Stimulation;DME Instruction;Gait training;Stair training;Functional mobility training;Therapeutic activities;Therapeutic exercise;Balance training;Neuromuscular re-education;Manual techniques;Wheelchair mobility training;Orthotic Fit/Training;Patient/family education    PT Next Visit Plan continue to work on LE strengtheing with emphasis on right LE flexion- supine, quadruped and tall kneeling; gait training with RW using modified AFO technique facilitate R hip flexors    PT Home Exercise Plan [id number] Consulted and Agree with Plan of Care Patient           Patient will benefit from skilled therapeutic intervention in order to improve the following deficits and impairments:  Abnormal gait,Decreased range of motion,Difficulty walking,Decreased endurance,Decreased activity tolerance,Decreased balance,Decreased mobility,Decreased strength  Visit Diagnosis: Unsteadiness on feet  Muscle weakness (generalized)  Other abnormalities of gait and mobility     Problem List Patient Active Problem List   Diagnosis Date Noted  . Weakness of both lower extremities 02/05/2021  . 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HCCFols[place]3/07/2021  . Neurogenic bladder 12/08/2020  . Neurogenic bowel 12/08/2020  . Paraplegia following spinal cord injury ([place]CC[place]3/07/2021  . Wheelchair dependence 12/08/2020  .  Spasticity 12/08/2020  . Chronic bilateral thoracic back pain 12/08/2020    Willow Ora, PTA, [facility] [place] [address] [place], [place] [address] [phone] 02/07/21, 7:39 PM   Name: Maurice Cruz MRN: [id number] Date of Birth: May 03, 1966

## 2021-02-09 HISTORY — PX: OTHER SURGICAL HISTORY: SHX169

## 2021-02-12 ENCOUNTER — Ambulatory Visit: Payer: BC Managed Care – PPO

## 2021-02-14 ENCOUNTER — Ambulatory Visit: Payer: BC Managed Care – PPO | Admitting: Physical Therapy

## 2021-02-15 ENCOUNTER — Other Ambulatory Visit: Payer: Self-pay

## 2021-02-15 ENCOUNTER — Ambulatory Visit: Payer: BC Managed Care – PPO

## 2021-02-15 DIAGNOSIS — R2681 Unsteadiness on feet: Secondary | ICD-10-CM

## 2021-02-15 DIAGNOSIS — R2689 Other abnormalities of gait and mobility: Secondary | ICD-10-CM

## 2021-02-15 DIAGNOSIS — G373 Acute transverse myelitis in demyelinating disease of central nervous system: Secondary | ICD-10-CM

## 2021-02-15 DIAGNOSIS — M6281 Muscle weakness (generalized): Secondary | ICD-10-CM

## 2021-02-16 NOTE — Therapy (Signed)
[facility] [address] [place], [place], [address] Phone: [phone]   Fax:  [phone]  Physical Therapy Treatment  Patient Details  Name: Maurice Cruz MRN: [id number] Date of Birth: Apr 29, 1966 Referring Provider (PT): Dr Courtney Heys   Encounter Date: 02/15/2021   PT End of Session - 02/15/21 0951    Visit Number 16    Number of Visits 25    Date for PT Re-Evaluation 02/13/21    Authorization Type BC[employer]- 60 visit limit (has used 20    Authorization - Visit Number 15    Authorization - Number of Visits 40    PT Start Time 4599    PT Stop Time 1400    PT Time Calculation (min) 45 min    Equipment Utilized During Treatment Gait belt    Activity Tolerance Patient tolerated treatment well;No increased pain    Behavior During Therapy WFL for tasks assessed/performed           Past Medical History:  Diagnosis Date  . Anxiety   . Hx of spinal cord injury 03/2020  . Hypertension     History reviewed. No pertinent surgical history.  There were no vitals filed for this visit.   Subjective Assessment - 02/15/21 0950    Subjective No falls to report, new spasm medication upsets his stomach and has begun to take 1/2 tab every 6 hrs vs 1 tab every 12 hrs    Patient is accompained by: Family member   dad   Pertinent History Began to notice symptoms in 11/21, had 2 week hospital stay followed by 1 mo. at [facility] in [place].  Was receiving OPPT at [facility] for LE strengthening however referring MD recommended this clinic, has since moved in with parents for physical as well as financial assistance and has obtained and installed a chair lift, denies LE pain but has a hx of low back pain worse with prolonged sitting    Limitations Standing;Walking    How long can you sit comfortably? 30 min    How long can you stand comfortably? <5 min    How long can you walk comfortably? <5 min    Currently in Pain? No/denies              02/15/21 0001  Berg Balance Test  Sit to Stand 3  Standing Unsupported 3  Sitting with Back Unsupported but Feet Supported on Floor or Stool 4  Stand to Sit 3  Transfers 3  Standing Unsupported with Eyes Closed 3  Standing Unsupported with Feet Together 3  From Standing, Reach Forward with Outstretched Arm 4  From Standing Position, Pick up Object from Floor 2  From Standing Position, Turn to Look Behind Over each Shoulder 4  Turn 360 Degrees 1  Standing Unsupported, Alternately Place Feet on Step/Stool 0  Standing Unsupported, One Foot in Front 2  Standing on One Leg 0  Total Score 35       02/16/21 0001  Transfers  Transfers Sit to Stand  Sit to Stand 5: Supervision  Stand to Sit 5: Supervision  Lateral/Scoot Transfers 6: Modified independent (Device/Increase time)  Ambulation/Gait  Ambulation/Gait Yes  Ambulation/Gait Assistance 5: Supervision;4: Min guard  Ambulation/Gait Assistance Details no modified AFO  Ambulation Distance (Feet) 345 Feet  Assistive device Rolling walker  Gait Pattern Step-through pattern;Decreased step length - right;Decreased hip/knee flexion - right;Decreased dorsiflexion - right  Ambulation Surface Level;Indoor    MMT of BLE strength  [facility] Adult PT Treatment/Exercise - 02/16/21 0001      Transfers   Transfers Sit to Stand    Sit to Stand 5: Supervision    Stand to Sit 5: Supervision    Lateral/Scoot Transfers 6: Modified independent (Device/Increase time)      Ambulation/Gait   Ambulation/Gait Yes    Ambulation/Gait Assistance 5: Supervision;4: Min guard    Ambulation/Gait Assistance Details no modified AFO    Ambulation Distance (Feet) 345 Feet    Assistive device Rolling walker    Gait Pattern Step-through pattern;Decreased step length - right;Decreased hip/knee flexion - right;Decreased dorsiflexion - right    Ambulation Surface Level;Indoor                    PT Short Term  Goals - 02/15/21 0956      PT SHORT TERM GOAL #1   Title Patient to demo initial HEP back to PT w/o need of cuing    Baseline initial HEP issued today; 01/22/21 HEP updated today, initial HEP demoed by patient    Time 4    Period Weeks    Status Achieved    Target Date 01/16/21      PT SHORT TERM GOAL #2   Title patine to able to demo stand pivot and STS transfers with S    Baseline able to perform stand pivot and STS transfers with CGA; 01/22/21 Able to perform STS transfer with UE support and only S    Time 4    Period Weeks    Status Achieved    Target Date 01/16/21      PT SHORT TERM GOAL #3   Title patient able to ambulate 354f aross level surfaces using RW and light CGA    Baseline 1125fwith RW across level surfaces with CGA and WC follow; 01/22/21 Able to ambulate 23084fith RW and SBA/CGA; 02/15/21 Ambulation of 345f87fross level ground with light CGA using RW w/o simulated AFO    Time 4    Period Weeks    Status Achieved    Target Date 01/16/21      PT SHORT TERM GOAL #4   Title patient to demo I in bed mobility with focus on control of RLE    Baseline requires CGA to transition from sit/supine needing to scoop RLE onto bed using LLE; 01/22/21 PAtient I in bed mobility accomodating for RLE weakness appropriately    Time 4    Period Weeks    Status Achieved    Target Date 01/16/21      PT SHORT TERM GOAL #5   Title Assess BERG and set appropriate goal    Baseline Berg performed and LTG written    Time 4    Period Weeks    Status Achieved    Target Date 01/16/21             PT Long Term Goals - 02/15/21 1335      PT LONG TERM GOAL #1   Title Pt will increase Berg score from 25/56 to >30/56 for improved balance and decreased fall risk. (All LTGs due 02/13/21)    Baseline 12/29/20 25/56; 02/15/21 BERG score 35    Time 8    Period Weeks    Status Achieved      PT LONG TERM GOAL #2   Title Ambulate 500ft43fh LRAD across level and unlevel ground with S     Baseline 115ft 56f RW across level ground with CGA; 02/15/21  Ambulation of 327f in gym across level surface with RW and CGA    Time 8    Period Weeks    Status Partially Met      PT LONG TERM GOAL #3   Title improve RLE strength throughout from 2 to 3/5 to 3+/5 to allow participatin in functional tasks    Baseline 2 to 3/5 RLE sterngth; 02/15/21 3/5 sterngth in R DF, PF and knee extension, 3-/5 knee flexion, 1/5 hip ext., 3-/5    Time 8    Period Weeks    Status On-going      PT LONG TERM GOAL #4   Title Patient to demo I in all transfers    Baseline CGA stand pivot and STS transfers; 02/15/21 Patient I in bed/ chair transfers, SBA for sit to stand    Time 8    Period Weeks    Status On-going      PT LONG TERM GOAL #5   Title patient will ambulate room to room distances with LRAd under S    Baseline 1114fwith RW and CGA    Time 8    Period Weeks    Status On-going      Additional Long Term Goals   Additional Long Term Goals Yes      PT LONG TERM GOAL #6   Title Patient to perform 2" step up on RLE using BUE support    Baseline Unable to flex hip against gravity    Time 8    Period Weeks    Status New    Target Date 04/20/21                 Plan - 02/15/21 095400  Clinical Impression Statement Todays skilled session focused on assessment of progress towards goals with plan of recertifying for an additional 8 weeks involving transition to aquatic therapy, see goal section for activities assessed, patient has met all STGs and is progressing well with meeting his LTGs    Personal Factors and Comorbidities Comorbidity 1    Comorbidities disease process    Examination-Activity Limitations Bed Mobility;Locomotion Level;Transfers;Continence;Toileting    Examination-Participation Restrictions Driving    Stability/Clinical Decision Making Stable/Uncomplicated    Rehab Potential Good    PT Frequency 2x / week    PT Duration 8 weeks    PT Treatment/Interventions ADLs/Self  Care Home Management;Aquatic Therapy;Electrical Stimulation;DME Instruction;Gait training;Stair training;Functional mobility training;Therapeutic activities;Therapeutic exercise;Balance training;Neuromuscular re-education;Manual techniques;Wheelchair mobility training;Orthotic Fit/Training;Patient/family education    PT Next Visit Plan Assess remaning goals and transition to aquatic therapy, review HEP    PT Home Exercise Plan [email or web address]  Consulted and Agree with Plan of Care Patient           Patient will benefit from skilled therapeutic intervention in order to improve the following deficits and impairments:  Abnormal gait,Decreased range of motion,Difficulty walking,Decreased endurance,Decreased activity tolerance,Decreased balance,Decreased mobility,Decreased strength  Visit Diagnosis: Unsteadiness on feet  Muscle weakness (generalized)  Other abnormalities of gait and mobility  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[facility]    Problem List Patient Active Problem List   Diagnosis Date Noted  . Weakness of both lower extremities 02/05/2021  . 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 Neurogenic bladder 12/08/2020  . Neurogenic bowel 12/08/2020  . Paraplegia following spinal cord injury ([facility]03/07/2021  . Wheelchair dependence 12/08/2020  . Spasticity 12/08/2020  . Chronic bilateral thoracic back pain 12/08/2020    JeLanice Shirts/20/2022, 10:22 AM  [facility] [address] [place], [place], [address] Phone: [phone]   Fax:  [phone]  Name: Maurice Cruz MRN: [id number] Date of Birth: 30-Aug-1966

## 2021-02-22 ENCOUNTER — Other Ambulatory Visit: Payer: Self-pay

## 2021-02-22 ENCOUNTER — Ambulatory Visit: Payer: BC Managed Care – PPO

## 2021-02-22 DIAGNOSIS — R2681 Unsteadiness on feet: Secondary | ICD-10-CM | POA: Diagnosis not present

## 2021-02-22 DIAGNOSIS — G373 Acute transverse myelitis in demyelinating disease of central nervous system: Secondary | ICD-10-CM

## 2021-02-22 DIAGNOSIS — R2689 Other abnormalities of gait and mobility: Secondary | ICD-10-CM

## 2021-02-22 DIAGNOSIS — M6281 Muscle weakness (generalized): Secondary | ICD-10-CM

## 2021-02-22 NOTE — Patient Instructions (Signed)
Access Code: [email or web address] URL: [email or web address] Date: 02/22/2021 Prepared by: Sharlynn Oliphant  Exercises Seated Heel Slide - 2 x daily - 7 x weekly - 2 sets - 10 reps Supine Single Bent Knee Fallout - 2 x daily - 7 x weekly - 2 sets - 10 reps Clamshell - 2 x daily - 7 x weekly - 2 sets - 10 reps Supine Bridge with Mini Swiss Ball Between Knees - 2 x daily - 7 x weekly - 2 sets - 10 reps Supine March - 2 x daily - 7 x weekly - 2 sets - 10 reps

## 2021-02-22 NOTE — Therapy (Signed)
[facility] [address] [place], [place], [address] Phone: [phone]   Fax:  [phone]  Physical Therapy Treatment  Patient Details  Name: Maurice Cruz MRN: [id number] Date of Birth: 26-May-1966 Referring Provider (PT): Dr Courtney Heys   Encounter Date: 02/22/2021   PT End of Session - 02/22/21 1807    Visit Number 17    Number of Visits 25    Date for PT Re-Evaluation 02/13/21    Authorization Type BCBS- 60 visit limit (has used 20    Authorization - Visit Number 15    Authorization - Number of Visits 40    Progress Note Due on Visit 20    PT Start Time 3500    PT Stop Time 1530    PT Time Calculation (min) 45 min    Equipment Utilized During Treatment Gait belt    Activity Tolerance Patient tolerated treatment well;No increased pain    Behavior During Therapy WFL for tasks assessed/performed           Past Medical History:  Diagnosis Date  . Anxiety   . Hx of spinal cord injury 03/2020  . Hypertension     History reviewed. No pertinent surgical history.  There were no vitals filed for this visit.   Subjective Assessment - 02/22/21 1451    Subjective Had 1 fall when WC caught and he fell out of it, was able to get himself off of floor w/o any assist    Patient is accompained by: Family member   dad   Pertinent History Began to notice symptoms in 11/21, had 2 week hospital stay followed by 1 mo. at [facility] in [place].  Was receiving OPPT at [facility] for LE strengthening however referring MD recommended this clinic, has since moved in with parents for physical as well as financial assistance and has obtained and installed a chair lift, denies LE pain but has a hx of low back pain worse with prolonged sitting    Limitations Standing;Walking    How long can you sit comfortably? 30 min    How long can you stand comfortably? <5 min    How long can you walk comfortably? <5 min                              [facility] Adult PT Treatment/Exercise - 02/22/21 0001      Transfers   Transfers Sit to Stand    Sit to Stand 5: Supervision    Stand to Sit 5: Supervision    Lateral/Scoot Transfers 6: Modified independent (Device/Increase time)      Ambulation/Gait   Ambulation/Gait Yes    Ambulation/Gait Assistance 5: Supervision;4: Min guard    Ambulation Distance (Feet) 230 Feet    Assistive device Rolling walker    Gait Pattern Step-through pattern    Ambulation Surface Level;Indoor      Lumbar Exercises: Supine   Bridge with [employer] Squeeze Non-compliant;Limitations;10 reps    Bridge with [employer] Limitations 3x10      Knee/Hip Exercises: Seated   Knee/Hip Flexion towel slides from mat table, 30x      Knee/Hip Exercises: Supine   Other Supine Knee/Hip Exercises R hip fallouts, 1# x10, 2# x20    Other Supine Knee/Hip Exercises alt. marching                  PT Education - 02/22/21 1518    Education  Details HEP finalized    Person(s) Educated Patient;Caregiver(s)    Methods Explanation;Demonstration;Handout    Comprehension Verbalized understanding;Returned demonstration            PT Short Term Goals - 02/15/21 0956      PT SHORT TERM GOAL #1   Title Patient to demo initial HEP back to PT w/o need of cuing    Baseline initial HEP issued today; 01/22/21 HEP updated today, initial HEP demoed by patient    Time 4    Period Weeks    Status Achieved    Target Date 01/16/21      PT SHORT TERM GOAL #2   Title patine to able to demo stand pivot and STS transfers with S    Baseline able to perform stand pivot and STS transfers with CGA; 01/22/21 Able to perform STS transfer with UE support and only S    Time 4    Period Weeks    Status Achieved    Target Date 01/16/21      PT SHORT TERM GOAL #3   Title patient able to ambulate 345ft aross level surfaces using RW and light CGA    Baseline 115ft with RW across level surfaces with CGA and  WC follow; 01/22/21 Able to ambulate 230ft with RW and SBA/CGA; 02/15/21 Ambulation of 345ft across level ground with light CGA using RW w/o simulated AFO    Time 4    Period Weeks    Status Achieved    Target Date 01/16/21      PT SHORT TERM GOAL #4   Title patient to demo I in bed mobility with focus on control of RLE    Baseline requires CGA to transition from sit/supine needing to scoop RLE onto bed using LLE; 01/22/21 PAtient I in bed mobility accomodating for RLE weakness appropriately    Time 4    Period Weeks    Status Achieved    Target Date 01/16/21      PT SHORT TERM GOAL #5   Title Assess BERG and set appropriate goal    Baseline Berg performed and LTG written    Time 4    Period Weeks    Status Achieved    Target Date 01/16/21             PT Long Term Goals - 02/22/21 1811      PT LONG TERM GOAL #1   Title Pt will increase Berg score from 25/56 to >30/56 for improved balance and decreased fall risk. (All LTGs due 02/13/21)    Baseline 12/29/20 25/56; 02/15/21 BERG score 35    Time 8    Period Weeks    Status Achieved      PT LONG TERM GOAL #2   Title Ambulate 500ft with LRAD across level and unlevel ground with S    Baseline 115ft with RW across level ground with CGA; 02/15/21 Ambulation of 345ft in gym across level surface with RW and CGA    Time 8    Period Weeks    Status Partially Met      PT LONG TERM GOAL #3   Title improve RLE strength throughout from 2 to 3/5 to 3+/5 to allow participatin in functional tasks    Baseline 2 to 3/5 RLE sterngth; 02/15/21 3/5 sterngth in R DF, PF and knee extension, 3-/5 knee flexion, 1/5 hip ext., 3-/5; 02/22/21 3/5 R hip flexion during supine march    Time 8    Period   Weeks    Status On-going      PT LONG TERM GOAL #4   Title Patient to demo I in all transfers    Baseline CGA stand pivot and STS transfers; 02/15/21 Patient I in bed/ chair transfers, SBA for sit to stand    Time 8    Period Weeks    Status On-going       PT LONG TERM GOAL #5   Title patient will ambulate room to room distances with LRAd under S    Baseline 115ft with RW and CGA    Time 8    Period Weeks    Status On-going      PT LONG TERM GOAL #6   Title Patient to perform 2" step up on RLE using BUE support    Baseline Unable to flex hip against gravity; 02/22/21 No change    Time 8    Period Weeks    Status On-going                 Plan - 02/22/21 1808    Clinical Impression Statement Todays skilled session focused on review of HEP and updates based on progress met and needs ongoing, more R hip flexion control noted and is now able to march in  supine, seated knee f/e more fluid as well.  Patient eager to begin aquatic rehab.  LTGs assessed and updated    Personal Factors and Comorbidities Comorbidity 1    Comorbidities disease process    Examination-Activity Limitations Bed Mobility;Locomotion Level;Transfers;Continence;Toileting    Examination-Participation Restrictions Driving    Stability/Clinical Decision Making Stable/Uncomplicated    Rehab Potential Good    PT Frequency 2x / week    PT Duration 8 weeks    PT Treatment/Interventions ADLs/Self Care Home Management;Aquatic Therapy;Electrical Stimulation;DME Instruction;Gait training;Stair training;Functional mobility training;Therapeutic activities;Therapeutic exercise;Balance training;Neuromuscular re-education;Manual techniques;Wheelchair mobility training;Orthotic Fit/Training;Patient/family education    PT Next Visit Plan Transition to aquatic program and re-assess as waranted    PT Home Exercise Plan [email or web address]    Consulted and Agree with Plan of Care Patient           Patient will benefit from skilled therapeutic intervention in order to improve the following deficits and impairments:  Abnormal gait,Decreased range of motion,Difficulty walking,Decreased endurance,Decreased activity tolerance,Decreased balance,Decreased mobility,Decreased strength  Visit  Diagnosis: Unsteadiness on feet  Muscle weakness (generalized)  Other abnormalities of gait and mobility  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HCC)     Problem List Patient Active Problem List   Diagnosis Date Noted  . Weakness of both lower extremities 02/05/2021  . 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HCC) 12/08/2020  . Neurogenic bladder 12/08/2020  . Neurogenic bowel 12/08/2020  . Paraplegia following spinal cord injury (HCC) 12/08/2020  . Wheelchair dependence 12/08/2020  . Spasticity 12/08/2020  . Chronic bilateral thoracic back pain 12/08/2020    Jeffrey M Ziemba PT 02/22/2021, 6:13 PM  [facility] [address] [place], , [address] Phone: [phone]   Fax:  [phone]  Name: Juandaniel E Kamiya MRN: [id number] Date of Birth: 07/23/1966   

## 2021-02-23 ENCOUNTER — Other Ambulatory Visit: Payer: Self-pay | Admitting: Neurology

## 2021-02-23 DIAGNOSIS — R768 Other specified abnormal immunological findings in serum: Secondary | ICD-10-CM

## 2021-02-23 DIAGNOSIS — G373 Acute transverse myelitis in demyelinating disease of central nervous system: Secondary | ICD-10-CM

## 2021-02-27 ENCOUNTER — Telehealth: Payer: Self-pay | Admitting: *Deleted

## 2021-02-27 NOTE — Telephone Encounter (Signed)
R\c cd from [employer]Cd on nurse desk.

## 2021-02-28 ENCOUNTER — Other Ambulatory Visit: Payer: Self-pay | Admitting: Neurology

## 2021-02-28 DIAGNOSIS — G373 Acute transverse myelitis in demyelinating disease of central nervous system: Secondary | ICD-10-CM

## 2021-02-28 DIAGNOSIS — R768 Other specified abnormal immunological findings in serum: Secondary | ICD-10-CM

## 2021-03-06 ENCOUNTER — Telehealth: Payer: Self-pay

## 2021-03-06 NOTE — Telephone Encounter (Signed)
Referral sent to [facility] Rheumatology to either Dr. Amil Amen or Dr. Trudie Reed. Dr. Estanislado Pandy previously declined the referral. P: [phone]

## 2021-03-12 ENCOUNTER — Telehealth: Payer: Self-pay | Admitting: Neurology

## 2021-03-12 DIAGNOSIS — G959 Disease of spinal cord, unspecified: Secondary | ICD-10-CM

## 2021-03-12 NOTE — Telephone Encounter (Addendum)
[facility] Rheumatology office called- stated patient had called their office to schedule but referral has been declined by Dr. Amil Amen. They recommended patient see an academic center such as [facility] or [facility] rheumatology.

## 2021-03-12 NOTE — Telephone Encounter (Signed)
I received the CD with multiple MRI's from [facility] ([place])  Movement artifact is noted on many of the images.  MRI thoracic spine without contrast 04/15/2020: There is a T2 hyperintense focus from T6-T7 to T12 with a large focus of hypointensity more to the left at T9-T10.  T1 weighted images are slightly hypointense at T9T10.  The focus  is concerning for a hemorrhage.  MRI of the lumbar spine 04/15/2020 is normal for age with minimal DJD at L5-S1.  MRI of the brain with and without contrast 04/16/2020: The brain is normal.  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  MRI of the cervical spine with and without contrast 04/16/2020: The spinal cord appears normal.  No significant degenerative changes.  MRI of the thoracic spine 04/16/2020 with contrast shows enhancement from T8-T10.  Given the MRI appearance, I am concerned that the paraplegia could be due to either a hemorrhage or tumor.  Therefore, I would like to get a current MRI of the thoracic spine with and without contrast and compared to the previous ones.

## 2021-03-12 NOTE — Telephone Encounter (Addendum)
I spoke with Dr. [name].  He recommends a referral to [facility] rheumatology. Order entered into [employer] [phone].  I called patient to discuss.  No answer, left a voicemail asking him to call me back.  If patient calls back please advise and we have tried two local rheumatology offices but both have recommended an academic center such as [facility].  Dr. [name] agreed with this and therefore his referral has been sent to [facility] rheumatology.  He can reach them at [phone] to schedule.

## 2021-03-13 ENCOUNTER — Telehealth: Payer: Self-pay | Admitting: Neurology

## 2021-03-13 ENCOUNTER — Telehealth: Payer: Self-pay | Admitting: *Deleted

## 2021-03-13 NOTE — Telephone Encounter (Addendum)
MRI thoracic spine w/wo contrast BC[employer] auth: [id number] (03/13/21-04/11/21)  Scheduled at [facility] 03/14/21 at 1:00 pm.

## 2021-03-13 NOTE — Telephone Encounter (Signed)
Called and spoke with pt about results per Dr. Garth Bigness note. Pt verbalized understanding. He is aware MD ordered MRI and to be on the look out for phone call to get it scheduled.

## 2021-03-13 NOTE — Telephone Encounter (Signed)
-----   Message from Britt Bottom, MD sent at 03/12/2021  9:43 PM EDT ----- Regarding: MRI Please let him know that I received a copy of the MRIs from July 2021.  There could have been a hemorrhage with some surrounding swelling and I would like to get a new MRI of the thoracic spine (I have placed the order with and without contrast) to compare side-by-side with the old one.

## 2021-03-14 ENCOUNTER — Ambulatory Visit: Payer: BC Managed Care – PPO

## 2021-03-14 DIAGNOSIS — G959 Disease of spinal cord, unspecified: Secondary | ICD-10-CM | POA: Diagnosis not present

## 2021-03-14 MED ORDER — GADOBENATE DIMEGLUMINE 529 MG/ML IV SOLN
15.0000 mL | Freq: Once | INTRAVENOUS | Status: AC | PRN
  Administered 2021-03-14: 15 mL via INTRAVENOUS

## 2021-03-19 ENCOUNTER — Telehealth (HOSPITAL_COMMUNITY): Payer: Self-pay

## 2021-03-19 ENCOUNTER — Other Ambulatory Visit: Payer: Self-pay | Admitting: Neurology

## 2021-03-19 ENCOUNTER — Telehealth: Payer: Self-pay | Admitting: Neurology

## 2021-03-19 ENCOUNTER — Other Ambulatory Visit (HOSPITAL_COMMUNITY): Payer: Self-pay | Admitting: Interventional Radiology

## 2021-03-19 DIAGNOSIS — G9519 Other vascular myelopathies: Secondary | ICD-10-CM

## 2021-03-19 DIAGNOSIS — R29898 Other symptoms and signs involving the musculoskeletal system: Secondary | ICD-10-CM

## 2021-03-19 NOTE — Telephone Encounter (Signed)
Called [place] back. Advised I was able to get copy of MRI CD from Dr. [name]. She will send over their courier to pick up CD. I placed up front for pick up.

## 2021-03-19 NOTE — Telephone Encounter (Signed)
Called to schedule consult with Dr. Estanislado Pandy for spinal angiogram, no answer, left vm. AW

## 2021-03-19 NOTE — Telephone Encounter (Signed)
I reviewed the MRI of the thoracic spine performed recently and compared.  It with 1 from July 2021, when his symptoms first appeared.  The current MRI shows some hyperintensity within the spinal cord, best seen on the T2 weighted images.  This could be consistent with hemosiderin.  I believe the most likely July 2021 was a hemorrhage.  He had associated edema spanning several levels and some enhancement at that time, both above and below the T9 hemorrhage  The current MRI does not show any enhancement making the possibility of a tumor as a source of the hemorrhage unlikely.  Spontaneous spinal hemorrhages can be caused by AVMs and cavernous angiomas.  There is potential for a rebleed.  He has made significant though incomplete recovery in his leg strength and it is important to prevent additional hemorrhages.  Therefore, I would like to have him evaluated with a spinal angiogram to help determine the etiology of the T9 hemorrhage and guide further treatment

## 2021-03-19 NOTE — Telephone Encounter (Signed)
[facility] Caryl Pina) called, Pt has an appt here tomorrow at 1 pm. Can we have a copy of MRI  done in [place]. Want to know if we can pick up a copy today? Would like a call from the nurse.  Contact info: [phone]

## 2021-03-20 ENCOUNTER — Ambulatory Visit
Admission: RE | Admit: 2021-03-20 | Discharge: 2021-03-20 | Disposition: A | Discharge status: Home / Self Care | Payer: Self-pay | Source: Ambulatory Visit | Attending: Interventional Radiology | Admitting: Interventional Radiology

## 2021-03-20 ENCOUNTER — Other Ambulatory Visit (HOSPITAL_COMMUNITY): Payer: Self-pay | Admitting: Interventional Radiology

## 2021-03-20 ENCOUNTER — Ambulatory Visit (HOSPITAL_COMMUNITY)
Admission: RE | Admit: 2021-03-20 | Discharge: 2021-03-20 | Disposition: A | Discharge status: Home / Self Care | Payer: BC Managed Care – PPO | Source: Ambulatory Visit | Attending: Interventional Radiology | Admitting: Interventional Radiology

## 2021-03-20 ENCOUNTER — Other Ambulatory Visit: Payer: Self-pay

## 2021-03-20 DIAGNOSIS — G9519 Other vascular myelopathies: Secondary | ICD-10-CM

## 2021-03-20 DIAGNOSIS — R52 Pain, unspecified: Secondary | ICD-10-CM

## 2021-03-20 DIAGNOSIS — R29898 Other symptoms and signs involving the musculoskeletal system: Secondary | ICD-10-CM

## 2021-03-21 HISTORY — PX: IR RADIOLOGIST EVAL & MGMT: IMG5224

## 2021-03-22 ENCOUNTER — Other Ambulatory Visit: Payer: Self-pay

## 2021-03-22 ENCOUNTER — Encounter (HOSPITAL_BASED_OUTPATIENT_CLINIC_OR_DEPARTMENT_OTHER): Payer: Self-pay | Admitting: Physical Therapy

## 2021-03-22 ENCOUNTER — Ambulatory Visit (HOSPITAL_BASED_OUTPATIENT_CLINIC_OR_DEPARTMENT_OTHER): Payer: BC Managed Care – PPO | Attending: Physical Medicine and Rehabilitation | Admitting: Physical Therapy

## 2021-03-22 DIAGNOSIS — R2689 Other abnormalities of gait and mobility: Secondary | ICD-10-CM | POA: Diagnosis present

## 2021-03-22 DIAGNOSIS — G373 Acute transverse myelitis in demyelinating disease of central nervous system: Secondary | ICD-10-CM | POA: Insufficient documentation

## 2021-03-22 DIAGNOSIS — R2681 Unsteadiness on feet: Secondary | ICD-10-CM | POA: Diagnosis not present

## 2021-03-22 DIAGNOSIS — M6281 Muscle weakness (generalized): Secondary | ICD-10-CM | POA: Diagnosis present

## 2021-03-22 NOTE — Therapy (Signed)
[facility] [address] [place], [place], [address] Phone: [phone]   Fax:  [phone]  Physical Therapy Treatment  Patient Details  Name: Maurice Cruz MRN: [id number] Date of Birth: 11/27/1965 Referring Provider (PT): Dr Courtney Heys   Encounter Date: 03/22/2021   PT End of Session - 03/22/21 1231     Visit Number 18    Number of Visits 25    Authorization Type BCBS- 60 visit limit (has used 20    PT Start Time 1125    PT Stop Time 1215    PT Time Calculation (min) 50 min    Equipment Utilized During Treatment Other (comment)   Ankle cuff and waist buoys, kick board, noodle/sqoodle, nekdoodle, weights and barbells   Activity Tolerance Patient tolerated treatment well;No increased pain    Behavior During Therapy [facility] for tasks assessed/performed             Past Medical History:  Diagnosis Date   Anxiety    Hx of spinal cord injury 03/2020   Hypertension     Past Surgical History:  Procedure Laterality Date   IR RADIOLOGIST EVAL & MGMT  03/21/2021    There were no vitals filed for this visit.   Subjective Assessment - 03/22/21 1229     Subjective No pain, looking forward to getting in the water "by the way I can't swim"    Pertinent History Began to notice symptoms in 11/21, had 2 week hospital stay followed by 1 mo. at [facility] in [place].  Was receiving OPPT at [facility] for LE strengthening however referring MD recommended this clinic, has since moved in with parents for physical as well as financial assistance and has obtained and installed a chair lift, denies LE pain but has a hx of low back pain worse with prolonged sitting    Limitations Standing;Walking    How long can you sit comfortably? 30 min    How long can you stand comfortably? <5 min    How long can you walk comfortably? <5 min             Pt seen for aquatic therapy today.  Treatment took place in water 3.25-4.8 ft in depth at  the [facility] pool. Temp of water was 91.  Pt entered/exited the pool via hydraulic lift assist x 2.  Warm up and cool down Forward Walking using water walker in 3-4.5 ft to introduce and acclimate to environment. Side and backward stepping, cuing for exaggerated knee flex, toe and heel strike,  Standing Supported by water walker df and pf 2 x 10 reps. (Slight muscle contraction right ant tib), marching and adduction/abduction 2 x 10 reps.  Cuing for abdominal and glute tightening throughout/core strengthening. Step ups on water step in 4.5 ft supported by water walker leading with each LE forward 2 x 10 reps, side stepping right and left then backward leading with each LE x 10 reps. Cuing for tight core throughout.  Suspended in vertical  Using squoodle to gain position, therapist facilitated stabilizing pt at hips, knees to chest then rotated right than left pt giving slight overpressure for lb and serratus stretching.  Strengthening knees to chest then kick out 2 x 5 reps.    Pt requires buoyancy for support and to offload joints with strengthening exercises. Viscosity of the water is needed for resistance of strengthening; water current perturbations provides challenge to standing balance unsupported, requiring increased core activation.  PT Education - 03/22/21 1230     Education Details Properties of water, benefits and advantages    Person(s) Educated Patient    Methods Explanation    Comprehension Verbalized understanding              PT Short Term Goals - 02/15/21 0956       PT SHORT TERM GOAL #1   Title Patient to demo initial HEP back to PT w/o need of cuing    Baseline initial HEP issued today; 01/22/21 HEP updated today, initial HEP demoed by patient    Time 4    Period Weeks    Status Achieved    Target Date 01/16/21      PT SHORT TERM GOAL #2   Title patine to able to demo stand pivot and STS transfers  with S    Baseline able to perform stand pivot and STS transfers with CGA; 01/22/21 Able to perform STS transfer with UE support and only S    Time 4    Period Weeks    Status Achieved    Target Date 01/16/21      PT SHORT TERM GOAL #3   Title patient able to ambulate 350ft aross level surfaces using RW and light CGA    Baseline 135ft with RW across level surfaces with CGA and WC follow; 01/22/21 Able to ambulate 226ft with RW and SBA/CGA; 02/15/21 Ambulation of 39ft across level ground with light CGA using RW w/o simulated AFO    Time 4    Period Weeks    Status Achieved    Target Date 01/16/21      PT SHORT TERM GOAL #4   Title patient to demo I in bed mobility with focus on control of RLE    Baseline requires CGA to transition from sit/supine needing to scoop RLE onto bed using LLE; 01/22/21 PAtient I in bed mobility accomodating for RLE weakness appropriately    Time 4    Period Weeks    Status Achieved    Target Date 01/16/21      PT SHORT TERM GOAL #5   Title Assess BERG and set appropriate goal    Baseline Berg performed and LTG written    Time 4    Period Weeks    Status Achieved    Target Date 01/16/21               PT Long Term Goals - 02/22/21 1811       PT LONG TERM GOAL #1   Title Pt will increase Berg score from 25/56 to >30/56 for improved balance and decreased fall risk. (All LTGs due 02/13/21)    Baseline 12/29/20 25/56; 02/15/21 BERG score 35    Time 8    Period Weeks    Status Achieved      PT LONG TERM GOAL #2   Title Ambulate 564ft with LRAD across level and unlevel ground with S    Baseline 194ft with RW across level ground with CGA; 02/15/21 Ambulation of 350ft in gym across level surface with RW and CGA    Time 8    Period Weeks    Status Partially Met      PT LONG TERM GOAL #3   Title improve RLE strength throughout from 2 to 3/5 to 3+/5 to allow participatin in functional tasks    Baseline 2 to 3/5 RLE sterngth; 02/15/21 3/5 sterngth in R DF,  PF and knee extension, 3-/5 knee flexion, 1/5  hip ext., 3-/5; 02/22/21 3/5 R hip flexion during supine march    Time 8    Period Weeks    Status On-going      PT LONG TERM GOAL #4   Title Patient to demo I in all transfers    Baseline CGA stand pivot and STS transfers; 02/15/21 Patient I in bed/ chair transfers, SBA for sit to stand    Time 8    Period Weeks    Status On-going      PT LONG TERM GOAL #5   Title patient will ambulate room to room distances with LRAd under S    Baseline 168ft with RW and CGA    Time 8    Period Weeks    Status On-going      PT LONG TERM GOAL #6   Title Patient to perform 2" step up on RLE using BUE support    Baseline Unable to flex hip against gravity; 02/22/21 No change    Time 8    Period Weeks    Status On-going                   Plan - 03/22/21 1232     Clinical Impression Statement Pt without any apprehension in aquatic setting despite his inaility toswim.  Focus of treatment is acclimation to environment, stretching and strengthening core and LE as tolerated.  Pt without c/o discomfort. Reports " his day has been made".  He will advance in all areas in aquatic setting as demonstrated by his willingness to participate and his ability to exercise in environemnt with more accuracy of movement and quality of muscle activitation attained.             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 [place], [place], [address] Phone: [phone]   Fax:  [phone]  Name: Maurice Cruz MRN: [id number] Date of Birth: [birth year]

## 2021-03-29 ENCOUNTER — Encounter (HOSPITAL_BASED_OUTPATIENT_CLINIC_OR_DEPARTMENT_OTHER): Payer: Self-pay | Admitting: Physical Therapy

## 2021-03-29 ENCOUNTER — Other Ambulatory Visit: Payer: Self-pay

## 2021-03-29 ENCOUNTER — Ambulatory Visit (HOSPITAL_BASED_OUTPATIENT_CLINIC_OR_DEPARTMENT_OTHER): Payer: BC Managed Care – PPO | Admitting: Physical Therapy

## 2021-03-29 DIAGNOSIS — R2689 Other abnormalities of gait and mobility: Secondary | ICD-10-CM

## 2021-03-29 DIAGNOSIS — G373 Acute transverse myelitis in demyelinating disease of central nervous system: Secondary | ICD-10-CM

## 2021-03-29 DIAGNOSIS — R2681 Unsteadiness on feet: Secondary | ICD-10-CM | POA: Diagnosis not present

## 2021-03-29 DIAGNOSIS — M6281 Muscle weakness (generalized): Secondary | ICD-10-CM

## 2021-03-29 NOTE — Therapy (Signed)
[facility] [address] [place], [place], [address] Phone: [phone]   Fax:  [phone]  Physical Therapy Treatment  Patient Details  Name: Maurice Cruz MRN: [id number] Date of Birth: 06-01-1966 Referring Provider (PT): Dr Courtney Heys   Encounter Date: 03/29/2021   PT End of Session - 03/29/21 1452     Visit Number 19    Number of Visits 25    Date for PT Re-Evaluation 02/13/21    Authorization Type BCBS- 60 visit limit (has used 20    PT Start Time 1400    PT Stop Time 1448    PT Time Calculation (min) 48 min    Equipment Utilized During Treatment Other (comment)    Activity Tolerance Patient tolerated treatment well;No increased pain    Behavior During Therapy [facility] for tasks assessed/performed             Past Medical History:  Diagnosis Date   Anxiety    Hx of spinal cord injury 03/2020   Hypertension     Past Surgical History:  Procedure Laterality Date   IR RADIOLOGIST EVAL & MGMT  03/21/2021    There were no vitals filed for this visit.   Subjective Assessment - 03/29/21 1707     Subjective I felt great after last treatment.  My mom came to watch, she is very exited.  No pain.    Pertinent History Began to notice symptoms in 11/21, had 2 week hospital stay followed by 1 mo. at [facility] in [place].  Was receiving OPPT at [facility] for LE strengthening however referring MD recommended this clinic, has since moved in with parents for physical as well as financial assistance and has obtained and installed a chair lift, denies LE pain but has a hx of low back pain worse with prolonged sitting             Pt seen for aquatic therapy today.  Treatment took place in water 3.25-4.8 ft in depth at the [employer] pool. Temp of water was 91.  Pt entered/exited the pool via hydraulic lift assist x 2.   Warm up and cool down Forward walking using water walker in 3-4.5 ft 2 lengths of  pool Side and backward stepping, cuing for exaggerated knee flex, toe and heel strike 2 lengths of pool each   Standing Step ups on water step in 4.5 ft supported by water walker leading with each LE forward x 10 reps, side stepping right and left then backward leading with each LE x 10 reps. Cuing for tight core throughout and decreasing ue support holding to walker with 2 finger pointers and middle Pf 2 x 10 reps   Vertically suspended Inside of water walker, stabilized at waist by therapist knees to chest holding to knees for overpressure and gentle stretch of LB 3 x 30 sec hold.  Knees to chest and jack knife x 10 reps each for strengthening.  Pt cued to focus on abdominals and erector spinae to ensure proper activation and control.  Sitting on squoodle Balance challenges for holding position 2 x 30 secs.  Bicycling 2 lengths of pool.                              PT Short Term Goals - 02/15/21 0956       PT SHORT TERM GOAL #1   Title Patient to demo initial HEP back to PT  w/o need of cuing    Baseline initial HEP issued today; 01/22/21 HEP updated today, initial HEP demoed by patient    Time 4    Period Weeks    Status Achieved    Target Date 01/16/21      PT SHORT TERM GOAL #2   Title patine to able to demo stand pivot and STS transfers with S    Baseline able to perform stand pivot and STS transfers with CGA; 01/22/21 Able to perform STS transfer with UE support and only S    Time 4    Period Weeks    Status Achieved    Target Date 01/16/21      PT SHORT TERM GOAL #3   Title patient able to ambulate 361f aross level surfaces using RW and light CGA    Baseline 1132fwith RW across level surfaces with CGA and WC follow; 01/22/21 Able to ambulate 23043fith RW and SBA/CGA; 02/15/21 Ambulation of 345f77fross level ground with light CGA using RW w/o simulated AFO    Time 4    Period Weeks    Status Achieved    Target Date 01/16/21      PT SHORT TERM  GOAL #4   Title patient to demo I in bed mobility with focus on control of RLE    Baseline requires CGA to transition from sit/supine needing to scoop RLE onto bed using LLE; 01/22/21 PAtient I in bed mobility accomodating for RLE weakness appropriately    Time 4    Period Weeks    Status Achieved    Target Date 01/16/21      PT SHORT TERM GOAL #5   Title Assess BERG and set appropriate goal    Baseline Berg performed and LTG written    Time 4    Period Weeks    Status Achieved    Target Date 01/16/21               PT Long Term Goals - 02/22/21 1811       PT LONG TERM GOAL #1   Title Pt will increase Berg score from 25/56 to >30/56 for improved balance and decreased fall risk. (All LTGs due 02/13/21)    Baseline 12/29/20 25/56; 02/15/21 BERG score 35    Time 8    Period Weeks    Status Achieved      PT LONG TERM GOAL #2   Title Ambulate 500ft67fh LRAD across level and unlevel ground with S    Baseline 115ft 15f RW across level ground with CGA; 02/15/21 Ambulation of 345ft i72fm across level surface with RW and CGA    Time 8    Period Weeks    Status Partially Met      PT LONG TERM GOAL #3   Title improve RLE strength throughout from 2 to 3/5 to 3+/5 to allow participatin in functional tasks    Baseline 2 to 3/5 RLE sterngth; 02/15/21 3/5 sterngth in R DF, PF and knee extension, 3-/5 knee flexion, 1/5 hip ext., 3-/5; 02/22/21 3/5 R hip flexion during supine march    Time 8    Period Weeks    Status On-going      PT LONG TERM GOAL #4   Title Patient to demo I in all transfers    Baseline CGA stand pivot and STS transfers; 02/15/21 Patient I in bed/ chair transfers, SBA for sit to stand    Time 8    Period  Weeks    Status On-going      PT LONG TERM GOAL #5   Title patient will ambulate room to room distances with LRAd under S    Baseline 132f with RW and CGA    Time 8    Period Weeks    Status On-going      PT LONG TERM GOAL #6   Title Patient to perform 2" step  up on RLE using BUE support    Baseline Unable to flex hip against gravity; 02/22/21 No change    Time 8    Period Weeks    Status On-going                   Plan - 03/29/21 1708     Clinical Impression Statement Pt with increased confidence in setting.  Was able to complete exercises as last visit adding core and decreasing ue support with amb and exercise to work on balance.  Pt reports looking forward to getting hand controls for car.    Personal Factors and Comorbidities Comorbidity 1    Examination-Activity Limitations Bed Mobility;Locomotion Level;Transfers;Continence;Toileting    Stability/Clinical Decision Making Stable/Uncomplicated    Clinical Decision Making Low    Rehab Potential Good    PT Frequency 2x / week    PT Treatment/Interventions ADLs/Self Care Home Management;Aquatic Therapy;Electrical Stimulation;DME Instruction;Gait training;Stair training;Functional mobility training;Therapeutic activities;Therapeutic exercise;Balance training;Neuromuscular re-education;Manual techniques;Wheelchair mobility training;Orthotic Fit/Training;Patient/family education             Patient will benefit from skilled therapeutic intervention in order to improve the following deficits and impairments:  Abnormal gait, Decreased range of motion, Difficulty walking, Decreased endurance, Decreased activity tolerance, Decreased balance, Decreased mobility, Decreased strength  Visit Diagnosis: Unsteadiness on feet  Muscle weakness (generalized)  Other abnormalities of gait and mobility  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HCC)     Problem List Patient Active Problem List   Diagnosis Date Noted   Weakness of both lower extremities 02/05/2021   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[place] 12/08/2020   Neurogenic bladder 12/08/2020   Neurogenic bowel 12/08/2020   Paraplegia following spinal cord injury ([place] 12/08/2020   Wheelchair dependence 12/08/2020   Spasticity 12/08/2020   Chronic bilateral  thoracic back pain 12/08/2020    MVedia Pereyra MPT 03/29/2021, 5:17 PM  [facility] [address]Phone: [phone]  Fax:  [phone] Name: MMARON STANZIONEMRN: [id number] of Birth: 107-May-1967

## 2021-04-05 ENCOUNTER — Encounter (HOSPITAL_BASED_OUTPATIENT_CLINIC_OR_DEPARTMENT_OTHER): Payer: Self-pay | Admitting: Physical Therapy

## 2021-04-05 ENCOUNTER — Ambulatory Visit (HOSPITAL_BASED_OUTPATIENT_CLINIC_OR_DEPARTMENT_OTHER): Payer: BC Managed Care – PPO | Attending: Physical Medicine and Rehabilitation | Admitting: Physical Therapy

## 2021-04-05 ENCOUNTER — Other Ambulatory Visit: Payer: Self-pay

## 2021-04-05 DIAGNOSIS — M6281 Muscle weakness (generalized): Secondary | ICD-10-CM | POA: Insufficient documentation

## 2021-04-05 DIAGNOSIS — R2681 Unsteadiness on feet: Secondary | ICD-10-CM | POA: Diagnosis not present

## 2021-04-05 DIAGNOSIS — G373 Acute transverse myelitis in demyelinating disease of central nervous system: Secondary | ICD-10-CM | POA: Insufficient documentation

## 2021-04-05 DIAGNOSIS — R2689 Other abnormalities of gait and mobility: Secondary | ICD-10-CM | POA: Insufficient documentation

## 2021-04-05 NOTE — Therapy (Signed)
[facility] [address] [place], [place], [address] Phone: [phone]   Fax:  [phone]  Physical Therapy Treatment  Patient Details  Name: Maurice Cruz MRN: [id number] Date of Birth: 1966-07-19 Referring Provider (PT): Dr Courtney Heys   Encounter Date: 04/05/2021   PT End of Session - 04/05/21 1534     Visit Number 20    Number of Visits 25    Date for PT Re-Evaluation 02/13/21    Authorization Type BCBS- 60 visit limit (has used 20    PT Start Time 1446    PT Stop Time 1529    PT Time Calculation (min) 43 min    Equipment Utilized During Treatment Other (comment)    Activity Tolerance Patient tolerated treatment well;No increased pain    Behavior During Therapy [facility] for tasks assessed/performed             Past Medical History:  Diagnosis Date   Anxiety    Hx of spinal cord injury 03/2020   Hypertension     Past Surgical History:  Procedure Laterality Date   IR RADIOLOGIST EVAL & MGMT  03/21/2021    There were no vitals filed for this visit.   Subjective Assessment - 04/05/21 1533     Subjective My bowels have not moved in a long time and my stomach is bothering me.            Pt seen for aquatic therapy today.  Treatment took place in water 3.25-4.8 ft in depth at the [employer] pool. Temp of water was 91.  Pt entered/exited the pool via hydraulic lift assist x 2.   Warm up and cool down Forward walking using water walker in 3-4.5 ft 1 length of pool then transitioned to squoodle Side and backward stepping, cuing for exaggerated knee flex, toe and heel strike 6 widths of pool   Seated Assisted stretching of hamstring, gastroc and hip adductors 3 x 30 sec hold Sit to stand without UE support cuing for immediate standing balance without leaning on bench.   Standing Hip hinges using kick board x 10 reps Pf, high knee marching and add/abd of hip 2 x 10 reps Jumping feet together x  10, abd/add x 10 and forward/back x 10 reps  Step ups on bottom step in 3 ft supported by bilat handrails leading with each LE forward  2x 5 reps. Verbal and tactile cuing for activation of quads, hip flexors and ant tibs as well as completing with proper tech and improved posture, accelerating and decelerating.  Pt unable to eccentrically control right knee into flex     Vertically suspended Inside of water walker, stabilized at waist by therapist knees to chest holding to knees for overpressure and gentle stretch of LB 3 x 30 sec hold.  Knees to chest and jack knife x 10 reps each for strengthening.  Pt cued to focus on abdominals and erector spinae to ensure proper activation and control.                               PT Education - 04/05/21 1746     Education Details --    Methods --    Comprehension --              PT Short Term Goals - 02/15/21 0956       PT SHORT TERM GOAL #1   Title Patient to  demo initial HEP back to PT w/o need of cuing    Baseline initial HEP issued today; 01/22/21 HEP updated today, initial HEP demoed by patient    Time 4    Period Weeks    Status Achieved    Target Date 01/16/21      PT SHORT TERM GOAL #2   Title patine to able to demo stand pivot and STS transfers with S    Baseline able to perform stand pivot and STS transfers with CGA; 01/22/21 Able to perform STS transfer with UE support and only S    Time 4    Period Weeks    Status Achieved    Target Date 01/16/21      PT SHORT TERM GOAL #3   Title patient able to ambulate 323f aross level surfaces using RW and light CGA    Baseline 1156fwith RW across level surfaces with CGA and WC follow; 01/22/21 Able to ambulate 23043fith RW and SBA/CGA; 02/15/21 Ambulation of 345f92fross level ground with light CGA using RW w/o simulated AFO    Time 4    Period Weeks    Status Achieved    Target Date 01/16/21      PT SHORT TERM GOAL #4   Title patient to demo I in bed  mobility with focus on control of RLE    Baseline requires CGA to transition from sit/supine needing to scoop RLE onto bed using LLE; 01/22/21 PAtient I in bed mobility accomodating for RLE weakness appropriately    Time 4    Period Weeks    Status Achieved    Target Date 01/16/21      PT SHORT TERM GOAL #5   Title Assess BERG and set appropriate goal    Baseline Berg performed and LTG written    Time 4    Period Weeks    Status Achieved    Target Date 01/16/21               PT Long Term Goals - 02/22/21 1811       PT LONG TERM GOAL #1   Title Pt will increase Berg score from 25/56 to >30/56 for improved balance and decreased fall risk. (All LTGs due 02/13/21)    Baseline 12/29/20 25/56; 02/15/21 BERG score 35    Time 8    Period Weeks    Status Achieved      PT LONG TERM GOAL #2   Title Ambulate 500ft109fh LRAD across level and unlevel ground with S    Baseline 115ft 61f RW across level ground with CGA; 02/15/21 Ambulation of 345ft i48fm across level surface with RW and CGA    Time 8    Period Weeks    Status Partially Met      PT LONG TERM GOAL #3   Title improve RLE strength throughout from 2 to 3/5 to 3+/5 to allow participatin in functional tasks    Baseline 2 to 3/5 RLE sterngth; 02/15/21 3/5 sterngth in R DF, PF and knee extension, 3-/5 knee flexion, 1/5 hip ext., 3-/5; 02/22/21 3/5 R hip flexion during supine march    Time 8    Period Weeks    Status On-going      PT LONG TERM GOAL #4   Title Patient to demo I in all transfers    Baseline CGA stand pivot and STS transfers; 02/15/21 Patient I in bed/ chair transfers, SBA for sit to stand  Time 8    Period Weeks    Status On-going      PT LONG TERM GOAL #5   Title patient will ambulate room to room distances with LRAd under S    Baseline 127f with RW and CGA    Time 8    Period Weeks    Status On-going      PT LONG TERM GOAL #6   Title Patient to perform 2" step up on RLE using BUE support    Baseline  Unable to flex hip against gravity; 02/22/21 No change    Time 8    Period Weeks    Status On-going                   Plan - 04/05/21 1749     Clinical Impression Statement Able to progress Pt to amb with decreased support using squoodle rather than water walker.  Added stair climbing on bottom water step submerged ~50%,  Good activation of hip flexors with carryover as reps continued although pt did fatigue quickly.  Visable right ant tib contration/activation.    Personal Factors and Comorbidities Comorbidity 1    Examination-Activity Limitations Bed Mobility;Locomotion Level;Transfers;Continence;Toileting    Examination-Participation Restrictions Driving    Stability/Clinical Decision Making Stable/Uncomplicated    Clinical Decision Making Low    Rehab Potential Good    PT Frequency 2x / week    PT Duration 8 weeks    PT Treatment/Interventions ADLs/Self Care Home Management;Aquatic Therapy;Electrical Stimulation;DME Instruction;Gait training;Stair training;Functional mobility training;Therapeutic activities;Therapeutic exercise;Balance training;Neuromuscular re-education;Manual techniques;Wheelchair mobility training;Orthotic Fit/Training;Patient/family education    PT Home Exercise Plan [id number]            Patient will benefit from skilled therapeutic intervention in order to improve the following deficits and impairments:  Abnormal gait, Decreased range of motion, Difficulty walking, Decreased endurance, Decreased activity tolerance, Decreased balance, Decreased mobility, Decreased strength  Visit Diagnosis: Unsteadiness on feet  Muscle weakness (generalized)  Other abnormalities of gait and mobility  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HCC)     Problem List Patient Active Problem List   Diagnosis Date Noted   Weakness of both lower extremities 02/05/2021   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[place] 12/08/2020   Neurogenic bladder 12/08/2020   Neurogenic bowel 12/08/2020   Paraplegia  following spinal cord injury ([place] 12/08/2020   Wheelchair dependence 12/08/2020   Spasticity 12/08/2020   Chronic bilateral thoracic back pain 12/08/2020    MVedia PereyraMPT 04/05/2021, 6:06 PM  [facility] [address] [address]Phone: [phone]  Fax:  [phone] Name: Maurice RAGLEMRN: [id number] of Birth: [birth year]

## 2021-04-06 ENCOUNTER — Encounter (HOSPITAL_COMMUNITY): Payer: Self-pay

## 2021-04-09 ENCOUNTER — Encounter: Payer: Self-pay | Admitting: Physical Medicine and Rehabilitation

## 2021-04-09 ENCOUNTER — Encounter
Payer: BC Managed Care – PPO | Attending: Physical Medicine and Rehabilitation | Admitting: Physical Medicine and Rehabilitation

## 2021-04-09 ENCOUNTER — Other Ambulatory Visit: Payer: Self-pay

## 2021-04-09 VITALS — BP 156/85 | HR 53 | Temp 98.2°F

## 2021-04-09 DIAGNOSIS — G8220 Paraplegia, unspecified: Secondary | ICD-10-CM | POA: Insufficient documentation

## 2021-04-09 DIAGNOSIS — R252 Cramp and spasm: Secondary | ICD-10-CM | POA: Insufficient documentation

## 2021-04-09 DIAGNOSIS — Z993 Dependence on wheelchair: Secondary | ICD-10-CM | POA: Insufficient documentation

## 2021-04-09 DIAGNOSIS — G373 Acute transverse myelitis in demyelinating disease of central nervous system: Secondary | ICD-10-CM | POA: Diagnosis not present

## 2021-04-09 MED ORDER — FLUOXETINE HCL 10 MG PO CAPS: 10.0000 mg | ORAL_CAPSULE | Freq: Every day | ORAL | 5 refills | Status: DC

## 2021-04-09 NOTE — Patient Instructions (Signed)
Pt is a 55 yr old R handed male with hx of HTN who developed transverse myelitis d'xd in 7/21. With neurogenic bowel and bladder and spasticity-   S/P steroid IV and IVIG- no plasmapheresis. now on meds for BP.   Will change Prozac to 10 mg daily #30 since not taking 20 mg, only 10 mg- daily- his insurance is upset about this.  Discussed Dantrolene vs continuing Tizanidine for spasticity- went over risks and benefits- of either- he decided to stick with Tizanidine- 2 mg nightly- and stick with that for now.  If pt decides to, can get him to change to Dantrolene, if he wants to - knows will have to get labs to do Dantrolene for LFTs/following them (liver function tests).  Seeing Cards, IR for spinal arteriogram and Urology in the next month- let me know results of each. 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  Will get Markus Jarvis - PT supervisor at [facility]- center at [address]- same building as Dr [name]- to send him info on Hand control driving.  6.  Not a good brace for R hip, unfortunately.  The only brace out there is so bulky, and so heavy, I don't see that helping.  7. Keep in touch on my chart  8. Con't pool therapy- is scheduled for 8 weeks total. However if making gains, then I can renew it.  9. Con't Tramadol- doesn't need refill- will do next screen at next appointment-  10. F/U in 3 months- double appt- SCI

## 2021-04-09 NOTE — Progress Notes (Signed)
Subjective:    Patient ID: Maurice Cruz, male    DOB: Sep 13, 1966, 55 y.o.   MRN: [id number]  HPI   Pt is a 55 yr old R handed male with hx of HTN who developed transverse myelitis d'xd in 7/21. With neurogenic bowel and bladder and spasticity-   S/P steroid IV and IVIG- no plasmapheresis.  Here for f/u on transverse myelitis/paraplegia.   Needs spinal angiogram- scheduled for 7/27- by Dr Estanislado Pandy- blood work showed has vasculitis and going to Rheumatology on 7/29-  Has been 1 year since original injury.   Was started Losartan for BP- 156/85- doing better.  Is usually 130s/80s now.   Started Zanaflex Head was icky and stomach was "icky"-  When wakes up in AM- ok- if eats ANYTHING- stomach gets agitated/cramping- queasy and aches.  Just taking Zanaflex 2 mg QHS now. Still taking some 2 mg- breakfast is the main issue after eating- but not after any other meal.   Feels spasms are better with it- for the whole day.  Not near as bad as they were.   Went to see Urology- PSA went back down tremendously.  They checked testosterone and bladder- urodynamics study- should be able to release bladder.   Also wants to check biopsy on prostate.  Waiting on that.   Hadn't gotten results for ECHO from December- Finally- Left ventricle has not worked as well on ECHO- and seeing Cards this week.  Nervous about this.   Doing pool therapy now- [facility]- loves it- and being in water- 1x/week.    Tramadol- Pain- pain usually 2-3/10- was in car a lot yesterday, so took 1 tramadol-  Aching by the end of day.  Last 2 weeks, taking every night.   Can stand now- with just pushing up on w/c. But walking with RW- not on his own yet.  Can walk ~ 30-50 ft slowly with RW  Pain Inventory Average Pain 3 Pain Right Now 2 My pain is aching  LOCATION OF PAIN  back  BOWEL Number of stools per week: 2 Oral laxative use Yes  Type of laxative senna Enema or suppository use No   History of colostomy No  Incontinent No   BLADDER Foley In and out cath, frequency . Able to self cath Yes  Bladder incontinence No  Frequent urination No  Leakage with coughing No  Difficulty starting stream Yes  Incomplete bladder emptying Yes    Mobility use a walker ability to climb steps?  no do you drive?  no use a wheelchair transfers alone  Function disabled: date disabled 04/14/20 I need assistance with the following:  meal prep  Neuro/Psych bladder control problems bowel control problems weakness trouble walking spasms anxiety  Prior Studies Any changes since last visit?  no  Physicians involved in your care Any changes since last visit?  no   Family History  Problem Relation Age of Onset   Cancer Maternal Grandmother    Social History   Socioeconomic History   Marital status: Single    Spouse name: Not on file   Number of children: 0   Years of education: HS   Highest education level: Not on file  Occupational History   Occupation: On disability  Tobacco Use   Smoking status: Never   Smokeless tobacco: Never  Substance and Sexual Activity   Alcohol use: Not Currently   Drug use: Never   Sexual activity: Not Currently  Other Topics Concern  Not on file  Social History Narrative   Right handed   1 glass tea per day   Coffee sometimes    No Soda   Lives with parents.   Social Determinants of Health   Financial Resource Strain: Not on file  Food Insecurity: Not on file  Transportation Needs: Not on file  Physical Activity: Not on file  Stress: Not on file  Social Connections: Not on file   Past Surgical History:  Procedure Laterality Date   IR RADIOLOGIST EVAL & MGMT  03/21/2021   Past Medical History:  Diagnosis Date   Anxiety    Hx of spinal cord injury 03/2020   Hypertension    BP (!) 156/85   Pulse (!) 53   Temp 98.2 F (36.8 C)   SpO2 98%   Opioid Risk Score:   Fall Risk Score:  `1  Depression screen PHQ  2/9  Depression screen PHQ 2/9 12/08/2020  Decreased Interest 3  Down, Depressed, Hopeless 3  PHQ - 2 Score 6  Altered sleeping 1  Tired, decreased energy 1  Change in appetite 2  Feeling bad or failure about yourself  3  Trouble concentrating 0  Moving slowly or fidgety/restless 0  Suicidal thoughts 0  PHQ-9 Score 13  Difficult doing work/chores Very difficult     Review of Systems  Gastrointestinal:  Positive for constipation.  Musculoskeletal:  Positive for back pain and gait problem.       Spasms  Neurological:  Positive for weakness.  Psychiatric/Behavioral:  Positive for dysphoric mood.   All other systems reviewed and are negative.     Objective:   Physical Exam Awake, alert, appropriate, in manual w/c, NAD Stood with pushing up on w/c MS: RLE- HF 1/5, KE 3+/5, DF 3+/5 and PF 3+/5 LLE- HF 4+/5, KE 4+/5, DF 5-/5 and PF 5-/5  Neuro:  MAS of 1+ in RLE- with fewer spasms elicited with ROM.       Assessment & Plan:    Pt is a 55 yr old R handed male with hx of HTN who developed transverse myelitis d'xd in 7/21. With neurogenic bowel and bladder and spasticity-   S/P steroid IV and IVIG- no plasmapheresis. now on meds for BP.   Will change Prozac to 10 mg daily #30 since not taking 20 mg, only 10 mg- daily- his insurance is upset about this.  Discussed Dantrolene vs continuing Tizanidine for spasticity- went over risks and benefits- of either- he decided to stick with Tizanidine- 2 mg nightly- and stick with that for now.  If pt decides to, can get him to change to Dantrolene, if he wants to - knows will have to get labs to do Dantrolene for LFTs/following them (liver function tests).  Seeing Cards, IR for spinal arteriogram and Urology in the next month- let me know results of each. 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  Will get Markus Jarvis - PT supervisor at [facility]- center at [address]- same building as Dr [name]- to send him info on Hand control driving.  6.  Not  a good brace for R hip, unfortunately.  The only brace out there is so bulky, and so heavy, I don't see that helping.  7. Keep in touch on my chart  8. Con't pool therapy- is scheduled for 8 weeks total. However if making gains, then I can renew it.  9. Con't Tramadol- doesn't need refill- will do next screen at next appointment-  10. F/U in  3 months- double appt- SCI   I spent a total of 43 minutes on appointment- as detailed- esp going over options for spasticity control.

## 2021-04-12 ENCOUNTER — Ambulatory Visit (HOSPITAL_BASED_OUTPATIENT_CLINIC_OR_DEPARTMENT_OTHER): Payer: BC Managed Care – PPO | Admitting: Physical Therapy

## 2021-04-12 ENCOUNTER — Other Ambulatory Visit: Payer: Self-pay

## 2021-04-12 ENCOUNTER — Encounter (HOSPITAL_BASED_OUTPATIENT_CLINIC_OR_DEPARTMENT_OTHER): Payer: Self-pay | Admitting: Physical Therapy

## 2021-04-12 DIAGNOSIS — R2681 Unsteadiness on feet: Secondary | ICD-10-CM | POA: Diagnosis not present

## 2021-04-12 DIAGNOSIS — G373 Acute transverse myelitis in demyelinating disease of central nervous system: Secondary | ICD-10-CM

## 2021-04-12 DIAGNOSIS — R2689 Other abnormalities of gait and mobility: Secondary | ICD-10-CM

## 2021-04-12 DIAGNOSIS — M6281 Muscle weakness (generalized): Secondary | ICD-10-CM

## 2021-04-12 NOTE — Therapy (Signed)
[facility] [address] [place], [place], [address] Phone: [phone]   Fax:  [phone]  Physical Therapy Treatment  Patient Details  Name: RESHAD SAAB MRN: [id number] Date of Birth: 11/03/1965 Referring Provider (PT): Dr Courtney Heys   Encounter Date: 04/12/2021   PT End of Session - 04/12/21 [place]     Visit Number 21    Number of Visits 25    Date for PT Re-Evaluation 02/13/21    Authorization Type [employer]- 60 visit limit (has used 20    PT Start Time 1446    PT Stop Time 1535    PT Time Calculation (min) 49 min    Equipment Utilized During Treatment Other (comment)    Activity Tolerance Patient tolerated treatment well;No increased pain    Behavior During Therapy [facility] for tasks assessed/performed             Past Medical History:  Diagnosis Date   Anxiety    Hx of spinal cord injury 03/2020   Hypertension     Past Surgical History:  Procedure Laterality Date   IR RADIOLOGIST EVAL & MGMT  03/21/2021    There were no vitals filed for this visit.   Subjective Assessment - 04/12/21 1828     Subjective Feeling good, no pain.  Saw MD she is happy with progress.  Said I looked like I had gained some strength.  Will continue with aquatic therapy    Pertinent History Began to notice symptoms in 11/21, had 2 week hospital stay followed by 1 mo. at [facility] in [place].  Was receiving OPPT at [facility] for LE strengthening however referring MD recommended this clinic, has since moved in with parents for physical as well as financial assistance and has obtained and installed a chair lift, denies LE pain but has a hx of low back pain worse with prolonged sitting    Limitations Standing;Walking    How long can you sit comfortably? 30 min    How long can you stand comfortably? 5 min    How long can you walk comfortably? 5 min    Currently in Pain? No/denies           Pt seen for aquatic therapy today.  Treatment  took place in water 3.25-4.8 ft in depth at the [facility] pool. Temp of water was 91.  Pt entered/exited the pool via hydraulic lift assist x 2.   Warm up and cool down Forward, backward and sidestepping walking using yellow noodle in 3-4.5 ft x 6 widths of pool    Seated Assisted stretching of hamstring, gastroc and hip adductors 3 x 30 sec hold Sit to stand without UE support cuing for immediate standing balance without leaning on bench.    Standing With ankle buoys: hip flex, hip ext, hip add/abd 2 x 10 reps.  Added extra ankle buoy for improved right hip flex and abd to allow for antagonist muscle contraction throughout range.  Stretching hip flex and abd 3 x 30 seconds. Manual asst with rle for stretching and strengthening.    Step ups on bottom step in 3 ft supported by bilat handrails leading with each LE forward  2x 5 reps. Verbal and tactile cuing for proper technique and body alignment and improved posture.  Improved positioning when rising and activation of muscles.     Superman Hands on bench supported by foam in prone sustained x 3 min for LB , shoulder and abdominal stretch. Knees to  chest 3 x 30 sec hold stretch: toward right then left shoulder.  Knees to chest and jack knife x 10 reps each for strengthening.  Pt cued to focus on abdominals and erector spinae to ensure proper activation and control.   Pt requires buoyancy for support and to offload joints with strengthening exercises. Viscosity of the water is needed for resistance of strengthening; water current perturbations provides challenge to standing balance unsupported, requiring increased core activation.                              PT Short Term Goals - 02/15/21 0956       PT SHORT TERM GOAL #1   Title Patient to demo initial HEP back to PT w/o need of cuing    Baseline initial HEP issued today; 01/22/21 HEP updated today, initial HEP demoed by patient    Time 4    Period  Weeks    Status Achieved    Target Date 01/16/21      PT SHORT TERM GOAL #2   Title patine to able to demo stand pivot and STS transfers with S    Baseline able to perform stand pivot and STS transfers with CGA; 01/22/21 Able to perform STS transfer with UE support and only S    Time 4    Period Weeks    Status Achieved    Target Date 01/16/21      PT SHORT TERM GOAL #3   Title patient able to ambulate 32f aross level surfaces using RW and light CGA    Baseline 1177fwith RW across level surfaces with CGA and WC follow; 01/22/21 Able to ambulate 23025fith RW and SBA/CGA; 02/15/21 Ambulation of 345f27fross level ground with light CGA using RW w/o simulated AFO    Time 4    Period Weeks    Status Achieved    Target Date 01/16/21      PT SHORT TERM GOAL #4   Title patient to demo I in bed mobility with focus on control of RLE    Baseline requires CGA to transition from sit/supine needing to scoop RLE onto bed using LLE; 01/22/21 PAtient I in bed mobility accomodating for RLE weakness appropriately    Time 4    Period Weeks    Status Achieved    Target Date 01/16/21      PT SHORT TERM GOAL #5   Title Assess BERG and set appropriate goal    Baseline Berg performed and LTG written    Time 4    Period Weeks    Status Achieved    Target Date 01/16/21               PT Long Term Goals - 02/22/21 1811       PT LONG TERM GOAL #1   Title Pt will increase Berg score from 25/56 to >30/56 for improved balance and decreased fall risk. (All LTGs due 02/13/21)    Baseline 12/29/20 25/56; 02/15/21 BERG score 35    Time 8    Period Weeks    Status Achieved      PT LONG TERM GOAL #2   Title Ambulate 500ft74fh LRAD across level and unlevel ground with S    Baseline 115ft 88f RW across level ground with CGA; 02/15/21 Ambulation of 345ft i81fm across level surface with RW and CGA    Time 8    Period  Weeks    Status Partially Met      PT LONG TERM GOAL #3   Title improve RLE strength  throughout from 2 to 3/5 to 3+/5 to allow participatin in functional tasks    Baseline 2 to 3/5 RLE sterngth; 02/15/21 3/5 sterngth in R DF, PF and knee extension, 3-/5 knee flexion, 1/5 hip ext., 3-/5; 02/22/21 3/5 R hip flexion during supine march    Time 8    Period Weeks    Status On-going      PT LONG TERM GOAL #4   Title Patient to demo I in all transfers    Baseline CGA stand pivot and STS transfers; 02/15/21 Patient I in bed/ chair transfers, SBA for sit to stand    Time 8    Period Weeks    Status On-going      PT LONG TERM GOAL #5   Title patient will ambulate room to room distances with LRAd under S    Baseline 178f with RW and CGA    Time 8    Period Weeks    Status On-going      PT LONG TERM GOAL #6   Title Patient to perform 2" step up on RLE using BUE support    Baseline Unable to flex hip against gravity; 02/22/21 No change    Time 8    Period Weeks    Status On-going                   Plan - 04/12/21 1830     Clinical Impression Statement Pt making excellent progress with amb in pool using a thick noodle, does not need water walker.  Le strength improving as evidenced by step ups decreased need for pulling u with ue's. No discomfort with any activities. Has become more confident in setting allowing PT to get pt floating in supine gaining new position to ex.    Personal Factors and Comorbidities Comorbidity 1    Comorbidities disease process    Stability/Clinical Decision Making Stable/Uncomplicated    Rehab Potential Good    PT Frequency 2x / week    PT Treatment/Interventions ADLs/Self Care Home Management;Aquatic Therapy;Electrical Stimulation;DME Instruction;Gait training;Stair training;Functional mobility training;Therapeutic activities;Therapeutic exercise;Balance training;Neuromuscular re-education;Manual techniques;Wheelchair mobility training;Orthotic Fit/Training;Patient/family education    PT Home Exercise Plan [id number]             Patient will benefit from skilled therapeutic intervention in order to improve the following deficits and impairments:  Abnormal gait, Decreased range of motion, Difficulty walking, Decreased endurance, Decreased activity tolerance, Decreased balance, Decreased mobility, Decreased strength  Visit Diagnosis: Unsteadiness on feet  Muscle weakness (generalized)  Other abnormalities of gait and mobility  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HCC)     Problem List Patient Active Problem List   Diagnosis Date Noted   Weakness of both lower extremities 02/05/2021   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[place] 12/08/2020   Neurogenic bladder 12/08/2020   Neurogenic bowel 12/08/2020   Paraplegia following spinal cord injury ([place] 12/08/2020   Wheelchair dependence 12/08/2020   Spasticity 12/08/2020   Chronic bilateral thoracic back pain 12/08/2020    MVedia Pereyra7/14/2022, 6:34 PM  [facility] [address] [address]Phone: [phone]  Fax:  [phone] Name: MCYRIL WOODMANSEEMRN: [id number] of Birth: 1August 02, 1967

## 2021-04-19 ENCOUNTER — Ambulatory Visit (HOSPITAL_BASED_OUTPATIENT_CLINIC_OR_DEPARTMENT_OTHER): Payer: BC Managed Care – PPO | Admitting: Physical Therapy

## 2021-04-23 ENCOUNTER — Other Ambulatory Visit (HOSPITAL_COMMUNITY)
Admission: RE | Admit: 2021-04-23 | Discharge: 2021-04-23 | Disposition: A | Discharge status: Home / Self Care | Payer: BC Managed Care – PPO | Source: Ambulatory Visit | Attending: Interventional Radiology | Admitting: Interventional Radiology

## 2021-04-23 DIAGNOSIS — Z01812 Encounter for preprocedural laboratory examination: Secondary | ICD-10-CM | POA: Insufficient documentation

## 2021-04-23 DIAGNOSIS — Z20822 Contact with and (suspected) exposure to covid-19: Secondary | ICD-10-CM | POA: Diagnosis not present

## 2021-04-23 LAB — SARS CORONAVIRUS 2 (TAT 6-24 HRS): SARS Coronavirus 2: NEGATIVE

## 2021-04-24 ENCOUNTER — Other Ambulatory Visit: Payer: Self-pay | Admitting: Radiology

## 2021-04-24 ENCOUNTER — Other Ambulatory Visit: Payer: Self-pay

## 2021-04-24 ENCOUNTER — Encounter (HOSPITAL_COMMUNITY): Payer: Self-pay | Admitting: Interventional Radiology

## 2021-04-24 NOTE — Progress Notes (Signed)
Mr. Maurice Cruz denies chest pain or shortness of breath. Patient was tested for Covid and has been in quarantine since that time.   Mr. Maurice Cruz reports that he had an EKG and an Echo at [facility], cardiologist was Dr. Brigitte Pulse.  I requested results and office visit notes.

## 2021-04-24 NOTE — H&P (Deleted)
  The note originally documented on this encounter has been moved the the encounter in which it belongs.  

## 2021-04-24 NOTE — H&P (Signed)
Chief Complaint: Bilateral lower extremity weakness. Abnormality of the thoracic spinal cord on MRI of the thoracic spine.  Referring Physician(s): Arlice Colt  Supervising Physician: Luanne Bras  Patient Status: [facility] - Out-pt  History of Present Illness: Maurice Cruz is a 55 y.o. male who experienced a sudden onset of low back pain sometime in July of 2021.   At the time of symptoms, he was at his desk working on his computer.   A few minutes later, he started experiencing weakness of his lower extremities which progressed to complete paralysis of the lower extremities in addition to altered sensation just above the level of his belly button.   He was taken to a local hospital where he underwent various studies including MRIs and lumbar punctures.   He was initially treated with 5 days of IV Solu-Medrol, and then 5 days of IV IG without any benefit.   He was given a diagnosis of transverse myelitis and was transferred to rehab in [place] for 4 weeks then was transferred to [facility] in November of last year.   Since that time he reports that his left leg is about 70% improved, and the right leg about 45% improved.   He is ambulate with a walker.   He continues to have decreased sensation below the level of T12/L1 to involve the lower extremities.   He also has symptoms of bladder fullness though without being able to initiate urination and has to perform self catheterization.  Per Dr. Arlean Hopping note =  Previously noted enlargement of the spinal cord starting at the level of approximately T6-T7 and extending to the level of T10, associated with enhancement with contrast appears appreciably less prominent. There continues to be a segmental area of enhancement though at the level of T9-T10 with a focal area of well-defined extramedullary rounded component.   Also evident are abnormal flow voids in the intradural but extramedullary space extending from  T6-T7 to T9. Small focal areas of signal hypointensity suggestive of hemosiderin are seen in the ventral intradural space in the midline at approximately T9.   Given the above findings with the clinical history, in order to definitively rule out any vascular abnormality such as a spinal dural AV fistula, arteriovenous malformation with or without an aneurysm, a spinal arteriogram would be appropriate.  He is here today for the procedure.  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  Medications: Prior to Admission medications   Medication Sig Start Date End Date Taking? Authorizing Provider  amLODipine (NORVASC) 10 MG tablet Take 5 mg by mouth daily.   Yes [provider]  carvedilol (COREG) 12.5 MG tablet Take 12.5 mg by mouth 2 (two) times daily with a meal.   Yes [provider]  docusate sodium (COLACE) 100 MG  capsule Take 200 mg by mouth daily.   Yes [provider]  FLUoxetine (PROZAC) 10 MG capsule Take 1 capsule (10 mg total) by mouth daily. For mood 04/09/21  Yes Lovorn, Jinny Blossom, MD  lactulose (CHRONULAC) 10 GM/15ML solution Take 20 g by mouth daily.   Yes [provider]  losartan (COZAAR) 25 MG tablet Take 50 mg by mouth daily.   Yes [provider]  Magnesium 250 MG TABS Take 250 mg by mouth daily.   Yes [provider]  Multiple Vitamins-Minerals (CENTRUM MEN PO) Take 1 tablet by mouth  daily.   Yes [provider]  pantoprazole (PROTONIX) 40 MG tablet Take 40 mg by mouth daily.   Yes [provider]  polycarbophil (FIBERCON) 625 MG tablet Take 1,250 mg by mouth daily.   Yes [provider]  rosuvastatin (CRESTOR) 5 MG tablet Take 5 mg by mouth daily.   Yes [provider]  senna (SENOKOT) 8.6 MG TABS tablet Take 2 tablets by mouth daily.   Yes [provider]  sildenafil (VIAGRA) 100 MG tablet Take 100 mg by mouth See admin instructions. Take 100 mg every 3 days as needed for erectile dysfunction 02/15/21  Yes [provider]  tiZANidine (ZANAFLEX) 4 MG tablet Take 0.5-1 tablets (2-4 mg total) by mouth 2 (two) times daily. Patient taking differently: Take 2 mg by mouth at bedtime. 02/02/21  Yes Lovorn, Jinny Blossom, MD  traMADol (ULTRAM) 50 MG tablet Take 1 tablet (50 mg total) by mouth every 6 (six) hours as needed. Patient taking differently: Take 50 mg by mouth every 6 (six) hours as needed for moderate pain. 12/08/20  Yes Lovorn, Jinny Blossom, MD  tamsulosin (FLOMAX) 0.4 MG CAPS capsule Take 1 capsule (0.4 mg total) by mouth daily. Patient not taking: No sig reported 02/05/21   Sater, Nanine Means, MD     Family History  Problem Relation Age of Onset   Cancer Maternal Grandmother     Social History   Socioeconomic History   Marital status: Single    Spouse name: Not on file   Number of children: 0   Years of education: HS   Highest education level: Not on file  Occupational History   Occupation: On disability  Tobacco Use   Smoking status: Never   Smokeless tobacco: Never  Substance and Sexual Activity   Alcohol use: Not Currently   Drug use: Never   Sexual activity: Not Currently  Other Topics Concern   Not on file  Social History Narrative   Right handed   1 glass tea per day   Coffee sometimes    No Soda   Lives with parents.   Social Determinants of Health   Financial Resource Strain: Not on file  Food  Insecurity: Not on file  Transportation Needs: Not on file  Physical Activity: Not on file  Stress: Not on file  Social Connections: Not on file     Review of Systems: A 12 point ROS discussed and pertinent positives are indicated in the HPI above.  All other systems are negative.  Review of Systems  Vital Signs: There were no vitals taken for this visit.  Physical Exam Vitals reviewed.  Constitutional:      Appearance: Normal appearance.  HENT:     Head: Normocephalic and atraumatic.  Eyes:     Extraocular Movements: Extraocular movements intact.  Cardiovascular:     Rate and Rhythm: Normal rate and regular rhythm.  Pulmonary:  Effort: Pulmonary effort is normal. No respiratory distress.     Breath sounds: Normal breath sounds.  Abdominal:     General: There is no distension.     Palpations: Abdomen is soft.     Tenderness: There is no abdominal tenderness.  Musculoskeletal:        General: Normal range of motion.     Cervical back: Normal range of motion.  Skin:    General: Skin is warm and dry.  Neurological:     Mental Status: He is alert and oriented to person, place, and time.     Comments: Lower extremity weakness left greater than right, without clonus. Decreased sensation to light touch 50% below the level of T12-L1.  Psychiatric:        Mood and Affect: Mood normal.        Behavior: Behavior normal.        Thought Content: Thought content normal.        Judgment: Judgment normal.    Imaging: No results found.  Labs:  CBC: Recent Labs    04/25/21 0606  WBC 8.6  HGB 13.0  HCT 40.6  PLT 336    COAGS: Recent Labs    04/25/21 0606  INR 1.0    BMP: Recent Labs    04/25/21 0606  NA 136  K 4.6  CL 104  CO2 24  GLUCOSE 94  BUN 13  CALCIUM 9.2  CREATININE 1.28*  GFRNONAA >60    LIVER FUNCTION TESTS: No results for input(s): BILITOT, AST, ALT, ALKPHOS, PROT, ALBUMIN in the last 8760 hours.  TUMOR MARKERS: No results for  input(s): AFPTM, CEA, CA199, CHROMGRNA in the last 8760 hours.  Assessment and Plan:  Need to rule out vascular abnormality such as a spinal dural AV fistula, arteriovenous malformation with or without an aneurysm.  Will proceed with spinal angiography today by Dr. Estanislado Pandy.  Risks and benefits of spinal angiography were discussed with the patient including, but not limited to bleeding, infection, vascular injury or contrast induced renal failure.  This interventional procedure involves the use of X-rays and because of the nature of the planned procedure, it is possible that we will have prolonged use of X-ray fluoroscopy.  Potential radiation risks to you include (but are not limited to) the following: - A slightly elevated risk for cancer  several years later in life. This risk is typically less than 0.5% percent. This risk is low in comparison to the normal incidence of human cancer, which is 33% for women and 50% for men according to the [place]. - Radiation induced injury can include skin redness, resembling a rash, tissue breakdown / ulcers and hair loss (which can be temporary or permanent).   The likelihood of either of these occurring depends on the difficulty of the procedure and whether you are sensitive to radiation due to previous procedures, disease, or genetic conditions.   IF your procedure requires a prolonged use of radiation, you will be notified and given written instructions for further action.  It is your responsibility to monitor the irradiated area for the 2 weeks following the procedure and to notify your physician if you are concerned that you have suffered a radiation induced injury.    All of the patient's questions were answered, patient is agreeable to proceed.  Consent signed and in chart.   Electronically Signed: Murrell Redden, PA-C   04/25/2021, 8:10 AM      I spent a total of  25 Minutes in face to face in clinical consultation,  greater than 50% of which was counseling/coordinating care for spinal angiography.

## 2021-04-25 ENCOUNTER — Ambulatory Visit (HOSPITAL_COMMUNITY)
Admission: RE | Admit: 2021-04-25 | Discharge: 2021-04-25 | Disposition: A | Discharge status: Home / Self Care | Payer: BC Managed Care – PPO | Attending: Interventional Radiology | Admitting: Interventional Radiology

## 2021-04-25 ENCOUNTER — Ambulatory Visit (HOSPITAL_COMMUNITY)
Admission: RE | Admit: 2021-04-25 | Discharge: 2021-04-25 | Disposition: A | Discharge status: Home / Self Care | Payer: BC Managed Care – PPO | Source: Ambulatory Visit | Attending: Neurology | Admitting: Neurology

## 2021-04-25 ENCOUNTER — Ambulatory Visit (HOSPITAL_COMMUNITY): Payer: BC Managed Care – PPO | Admitting: Anesthesiology

## 2021-04-25 ENCOUNTER — Encounter (HOSPITAL_COMMUNITY)
Admission: RE | Disposition: A | Discharge status: Home / Self Care | Payer: Self-pay | Source: Home / Self Care | Attending: Interventional Radiology

## 2021-04-25 ENCOUNTER — Encounter (HOSPITAL_COMMUNITY): Payer: Self-pay | Admitting: Interventional Radiology

## 2021-04-25 ENCOUNTER — Other Ambulatory Visit: Payer: Self-pay

## 2021-04-25 DIAGNOSIS — R29898 Other symptoms and signs involving the musculoskeletal system: Secondary | ICD-10-CM

## 2021-04-25 DIAGNOSIS — G8220 Paraplegia, unspecified: Secondary | ICD-10-CM | POA: Diagnosis not present

## 2021-04-25 DIAGNOSIS — Z79899 Other long term (current) drug therapy: Secondary | ICD-10-CM | POA: Insufficient documentation

## 2021-04-25 DIAGNOSIS — I10 Essential (primary) hypertension: Secondary | ICD-10-CM | POA: Diagnosis not present

## 2021-04-25 DIAGNOSIS — G9519 Other vascular myelopathies: Secondary | ICD-10-CM | POA: Insufficient documentation

## 2021-04-25 HISTORY — DX: Gastro-esophageal reflux disease without esophagitis: K21.9

## 2021-04-25 HISTORY — DX: Depression, unspecified: F32.A

## 2021-04-25 HISTORY — PX: IR ANGIO VERTEBRAL SEL VERTEBRAL UNI R MOD SED: IMG5368

## 2021-04-25 HISTORY — PX: IR ANGIOGRAM EXTREMITY BILATERAL: IMG653

## 2021-04-25 HISTORY — DX: Neurogenic bowel, not elsewhere classified: K59.2

## 2021-04-25 HISTORY — PX: IR ANGIO VERTEBRAL SEL SUBCLAVIAN INNOMINATE UNI L MOD SED: IMG5364

## 2021-04-25 HISTORY — DX: Male erectile dysfunction, unspecified: N52.9

## 2021-04-25 HISTORY — DX: Acute transverse myelitis in demyelinating disease of central nervous system: G37.3

## 2021-04-25 HISTORY — DX: Neuromuscular dysfunction of bladder, unspecified: N31.9

## 2021-04-25 HISTORY — DX: Paraplegia, unspecified: G82.20

## 2021-04-25 HISTORY — DX: Unspecified asthma, uncomplicated: J45.909

## 2021-04-25 HISTORY — PX: IR ANGIO INTRA EXTRACRAN SEL COM CAROTID INNOMINATE BILAT MOD SED: IMG5360

## 2021-04-25 HISTORY — PX: IR ANGIO/SPINAL LEFT: IMG2270

## 2021-04-25 HISTORY — PX: IR ANGIO/SPINAL RIGHT: IMG2271

## 2021-04-25 HISTORY — PX: RADIOLOGY WITH ANESTHESIA: SHX6223

## 2021-04-25 LAB — BASIC METABOLIC PANEL
Anion gap: 8 (ref 5–15)
BUN: 13 mg/dL (ref 6–20)
CO2: 24 mmol/L (ref 22–32)
Calcium: 9.2 mg/dL (ref 8.9–10.3)
Chloride: 104 mmol/L (ref 98–111)
Creatinine, Ser: 1.28 mg/dL — ABNORMAL HIGH (ref 0.61–1.24)
GFR, Estimated: >60 mL/min (ref >60)
Glucose, Bld: 94 mg/dL (ref 70–99)
Potassium: 4.6 mmol/L (ref 3.5–5.1)
Sodium: 136 mmol/L (ref 135–145)

## 2021-04-25 LAB — CBC
HCT: 40.6 % (ref 39.0–52.0)
Hemoglobin: 13 g/dL (ref 13.0–17.0)
MCH: 27.5 pg (ref 26.0–34.0)
MCHC: 32 g/dL (ref 30.0–36.0)
MCV: 85.8 fL (ref 80.0–100.0)
Platelets: 336 10*3/uL (ref 150–400)
RBC: 4.73 MIL/uL (ref 4.22–5.81)
RDW: 13.4 % (ref 11.5–15.5)
WBC: 8.6 10*3/uL (ref 4.0–10.5)
nRBC: 0 % (ref 0.0–0.2)

## 2021-04-25 LAB — POCT ACTIVATED CLOTTING TIME: Activated Clotting Time: 185 seconds

## 2021-04-25 LAB — PROTIME-INR
INR: 1 (ref 0.8–1.2)
Prothrombin Time: 12.9 seconds (ref 11.4–15.2)

## 2021-04-25 SURGERY — IR WITH ANESTHESIA
Anesthesia: General

## 2021-04-25 MED ORDER — OXYCODONE HCL 5 MG PO TABS
5.0000 mg | ORAL_TABLET | Freq: Once | ORAL | Status: DC | PRN
Start: 2021-04-25 — End: 2021-04-25

## 2021-04-25 MED ORDER — CEFAZOLIN SODIUM-DEXTROSE 2-3 GM-%(50ML) IV SOLR
INTRAVENOUS | Status: DC | PRN
  Administered 2021-04-25: 2 g via INTRAVENOUS

## 2021-04-25 MED ORDER — LIDOCAINE HCL 1 % IJ SOLN
INTRAMUSCULAR | Status: AC
  Filled 2021-04-25: qty 20

## 2021-04-25 MED ORDER — LIDOCAINE 2% (20 MG/ML) 5 ML SYRINGE
INTRAMUSCULAR | Status: DC | PRN
  Administered 2021-04-25: 60 mg via INTRAVENOUS
  Administered 2021-04-25: 40 mg via INTRAVENOUS

## 2021-04-25 MED ORDER — ASPIRIN EC 325 MG PO TBEC
DELAYED_RELEASE_TABLET | ORAL | Status: AC
  Administered 2021-04-25: 325 mg
  Filled 2021-04-25: qty 1

## 2021-04-25 MED ORDER — IOHEXOL 300 MG/ML  SOLN
100.0000 mL | Freq: Once | INTRAMUSCULAR | Status: AC | PRN
  Administered 2021-04-25: 100 mL via INTRA_ARTERIAL

## 2021-04-25 MED ORDER — ORAL CARE MOUTH RINSE: 15.0000 mL | Freq: Once | OROMUCOSAL | Status: AC

## 2021-04-25 MED ORDER — PHENYLEPHRINE HCL-NACL 10-0.9 MG/250ML-% IV SOLN
INTRAVENOUS | Status: DC | PRN
  Administered 2021-04-25: 50 ug/min via INTRAVENOUS

## 2021-04-25 MED ORDER — OXYCODONE HCL 5 MG/5ML PO SOLN: 5.0000 mg | Freq: Once | ORAL | Status: DC | PRN

## 2021-04-25 MED ORDER — FENTANYL CITRATE (PF) 100 MCG/2ML IJ SOLN: 25.0000 ug | INTRAMUSCULAR | Status: DC | PRN

## 2021-04-25 MED ORDER — PROPOFOL 10 MG/ML IV BOLUS
INTRAVENOUS | Status: DC | PRN
  Administered 2021-04-25: 50 mg via INTRAVENOUS
  Administered 2021-04-25: 150 mg via INTRAVENOUS

## 2021-04-25 MED ORDER — LIDOCAINE HCL (PF) 1 % IJ SOLN
INTRAMUSCULAR | Status: DC | PRN
  Administered 2021-04-25: 9 mL via SUBCUTANEOUS

## 2021-04-25 MED ORDER — ASPIRIN 325 MG PO TABS: 325.0000 mg | ORAL_TABLET | Freq: Every day | ORAL | Status: DC

## 2021-04-25 MED ORDER — ONDANSETRON HCL 4 MG/2ML IJ SOLN
INTRAMUSCULAR | Status: DC | PRN
  Administered 2021-04-25: 4 mg via INTRAVENOUS

## 2021-04-25 MED ORDER — CHLORHEXIDINE GLUCONATE 0.12 % MT SOLN: 15.0000 mL | Freq: Once | OROMUCOSAL | Status: AC

## 2021-04-25 MED ORDER — CHLORHEXIDINE GLUCONATE 0.12 % MT SOLN
OROMUCOSAL | Status: AC
  Administered 2021-04-25: 15 mL via OROMUCOSAL
  Filled 2021-04-25: qty 15

## 2021-04-25 MED ORDER — LACTATED RINGERS IV SOLN: INTRAVENOUS | Status: DC | PRN

## 2021-04-25 MED ORDER — ACETAMINOPHEN 160 MG/5ML PO SOLN: 325.0000 mg | ORAL | Status: DC | PRN

## 2021-04-25 MED ORDER — CEFAZOLIN SODIUM-DEXTROSE 2-4 GM/100ML-% IV SOLN
INTRAVENOUS | Status: AC
  Filled 2021-04-25: qty 100

## 2021-04-25 MED ORDER — AMISULPRIDE (ANTIEMETIC) 5 MG/2ML IV SOLN: 10.0000 mg | Freq: Once | INTRAVENOUS | Status: DC | PRN

## 2021-04-25 MED ORDER — ACETAMINOPHEN 10 MG/ML IV SOLN: 1000.0000 mg | Freq: Once | INTRAVENOUS | Status: DC | PRN

## 2021-04-25 MED ORDER — PROMETHAZINE HCL 25 MG/ML IJ SOLN: 6.2500 mg | INTRAMUSCULAR | Status: DC | PRN

## 2021-04-25 MED ORDER — HEPARIN SODIUM (PORCINE) 1000 UNIT/ML IJ SOLN
INTRAMUSCULAR | Status: DC | PRN
  Administered 2021-04-25: 3000 [IU] via INTRAVENOUS

## 2021-04-25 MED ORDER — ROCURONIUM BROMIDE 10 MG/ML (PF) SYRINGE
PREFILLED_SYRINGE | INTRAVENOUS | Status: DC | PRN
  Administered 2021-04-25 (×2): 50 mg via INTRAVENOUS

## 2021-04-25 MED ORDER — SUGAMMADEX SODIUM 200 MG/2ML IV SOLN
INTRAVENOUS | Status: DC | PRN
  Administered 2021-04-25: 200 mg via INTRAVENOUS

## 2021-04-25 MED ORDER — ACETAMINOPHEN 325 MG PO TABS: 325.0000 mg | ORAL_TABLET | ORAL | Status: DC | PRN

## 2021-04-25 MED ORDER — SODIUM CHLORIDE 0.9 % IV SOLN: Freq: Once | INTRAVENOUS | Status: AC

## 2021-04-25 NOTE — Anesthesia Procedure Notes (Signed)
Arterial Line Insertion Start/End7/27/2022 8:15 AM, 04/25/2021 8:30 AM Performed by: Effie Berkshire, MD, Georgia Duff, CRNA, CRNA  Preanesthetic checklist: patient identified Lidocaine 1% used for infiltration Left, radial was placed Catheter size: 20 G  Attempts: 1 Procedure performed without using ultrasound guided technique. Following insertion, dressing applied and Biopatch. Post procedure assessment: normal  Patient tolerated the procedure well with no immediate complications.

## 2021-04-25 NOTE — Anesthesia Procedure Notes (Signed)
Procedure Name: Intubation Date/Time: 04/25/2021 9:20 AM Performed by: Georgia Duff, CRNA Pre-anesthesia Checklist: Patient identified, Emergency Drugs available, Suction available and Patient being monitored Patient Re-evaluated:Patient Re-evaluated prior to induction Oxygen Delivery Method: Circle System Utilized Preoxygenation: Pre-oxygenation with 100% oxygen Induction Type: IV induction Ventilation: Mask ventilation without difficulty Laryngoscope Size: Miller and 2 Tube type: Oral Tube size: 7.5 mm Number of attempts: 1 Airway Equipment and Method: Stylet and Oral airway Placement Confirmation: ETT inserted through vocal cords under direct vision, positive ETCO2 and breath sounds checked- equal and bilateral Secured at: 23 cm Tube secured with: Tape Dental Injury: Teeth and Oropharynx as per pre-operative assessment

## 2021-04-25 NOTE — Transfer of Care (Signed)
Immediate Anesthesia Transfer of Care Note  Patient: Maurice Cruz  Procedure(s) Performed: IR WITH ANESTHESIA SPINAL ANGIOGRAM  Patient Location: PACU  Anesthesia Type:General  Level of Consciousness: drowsy and patient cooperative  Airway & Oxygen Therapy: Patient Spontanous Breathing  Post-op Assessment: Report given to RN and Post -op Vital signs reviewed and stable  Post vital signs: Reviewed and stable  Last Vitals:  Vitals Value Taken Time  BP 139/88 04/25/21 1315  Temp    Pulse 46 04/25/21 1320  Resp 15 04/25/21 1320  SpO2 100 % 04/25/21 1320  Vitals shown include unvalidated device data.  Last Pain:  Vitals:   04/25/21 1315  TempSrc:   PainSc: 0-No pain         Complications: No notable events documented.

## 2021-04-25 NOTE — Progress Notes (Signed)
Patients  A line pulled at this time patient tolerated procedure well pressure held to area and pressure dressing in place

## 2021-04-25 NOTE — Anesthesia Preprocedure Evaluation (Signed)
Anesthesia Evaluation    Reviewed: Allergy & Precautions, Patient's Chart, lab work & pertinent test results  Airway Mallampati: II  TM Distance: >3 FB Neck ROM: Full    Dental  (+) Teeth Intact, Dental Advisory Given   Pulmonary asthma ,    breath sounds clear to auscultation       Cardiovascular hypertension, Pt. on medications and Pt. on home beta blockers  Rhythm:Regular Rate:Normal     Neuro/Psych PSYCHIATRIC DISORDERS Anxiety Depression negative neurological ROS     GI/Hepatic Neg liver ROS, GERD  Medicated,  Endo/Other  negative endocrine ROS  Renal/GU negative Renal ROS     Musculoskeletal negative musculoskeletal ROS (+)   Abdominal Normal abdominal exam  (+)   Peds  Hematology negative hematology ROS (+)   Anesthesia Other Findings   Reproductive/Obstetrics                             Anesthesia Physical Anesthesia Plan  ASA: 3  Anesthesia Plan: General   Post-op Pain Management:    Induction: Intravenous  PONV Risk Score and Plan: 3 and Ondansetron, Dexamethasone and Midazolam  Airway Management Planned: Oral ETT  Additional Equipment: Arterial line  Intra-op Plan:   Post-operative Plan: Extubation in OR  Informed Consent:   Plan Discussed with: CRNA  Anesthesia Plan Comments:         Anesthesia Quick Evaluation

## 2021-04-25 NOTE — Procedures (Signed)
S/P 4 vessel cerebral arteriogram and a complete selective spinal arteriogram Rt CFA approach. Findings . 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 2.No evidence of AV shunting or of aneurysm or DAV fistula or AVM noted. Extubated . Following simple commands appropriately. Pupils 2 to 65m Rt = LT .  Bilateral LE weakness unchanged. RT groin soft. Distal pulses intact. S.Jing Howatt MD

## 2021-04-25 NOTE — Progress Notes (Signed)
Call made to Dr. Estanislado Pandy regarding A line stated A Line may be discontinued patient is alert and talkative

## 2021-04-25 NOTE — Anesthesia Postprocedure Evaluation (Signed)
Anesthesia Post Note  Patient: Maurice Cruz  Procedure(s) Performed: IR WITH ANESTHESIA SPINAL ANGIOGRAM     Patient location during evaluation: PACU Anesthesia Type: General Level of consciousness: awake and alert Pain management: pain level controlled Vital Signs Assessment: post-procedure vital signs reviewed and stable Respiratory status: spontaneous breathing, nonlabored ventilation, respiratory function stable and patient connected to nasal cannula oxygen Cardiovascular status: blood pressure returned to baseline and stable Postop Assessment: no apparent nausea or vomiting Anesthetic complications: no   No notable events documented.  Last Vitals:  Vitals:   04/25/21 1545 04/25/21 1546  BP: (!) 150/93 (!) 150/93  Pulse: 61 (!) 51  Resp: 16 14  Temp: 36.8 C   SpO2: 100% 99%    Last Pain:  Vitals:   04/25/21 1545  TempSrc:   PainSc: 0-No pain                 Effie Berkshire

## 2021-04-25 NOTE — Progress Notes (Addendum)
Patient in and out at this time noted to have slow return of yellow urine also noted patient voiding around catheter tubing patient tolerated procedure well   1510 patient requested urinal  be left between legs after previous cath noted to have 500cc of yellow urine patient noted he could feel himself voiding every little bit      1515 patient bladder scanned at this time showing 495cc yellow urine noted tolerated procedure well patient requested to be stat cath again noted 500 cc of yellow urine

## 2021-04-26 ENCOUNTER — Ambulatory Visit (HOSPITAL_BASED_OUTPATIENT_CLINIC_OR_DEPARTMENT_OTHER): Payer: BC Managed Care – PPO | Admitting: Physical Therapy

## 2021-04-26 ENCOUNTER — Encounter (HOSPITAL_COMMUNITY): Payer: Self-pay | Admitting: Interventional Radiology

## 2021-04-30 ENCOUNTER — Telehealth: Payer: Self-pay | Admitting: Neurology

## 2021-04-30 NOTE — Telephone Encounter (Signed)
I reviewed his recent spinal angiogram.  It did not show any evidence of artery stenosis or dural AV fistula.  Therefore, if changes were due to stroke or hemorrhage there would be a low likelihood of an additional event.  I called to go over the results and got voicemail.  I left a message and will try to reach him later this week.

## 2021-05-03 ENCOUNTER — Encounter (HOSPITAL_BASED_OUTPATIENT_CLINIC_OR_DEPARTMENT_OTHER): Payer: Self-pay | Admitting: Physical Therapy

## 2021-05-03 ENCOUNTER — Other Ambulatory Visit: Payer: Self-pay

## 2021-05-03 ENCOUNTER — Ambulatory Visit (HOSPITAL_BASED_OUTPATIENT_CLINIC_OR_DEPARTMENT_OTHER): Payer: BC Managed Care – PPO | Attending: Physical Medicine and Rehabilitation | Admitting: Physical Therapy

## 2021-05-03 DIAGNOSIS — M6281 Muscle weakness (generalized): Secondary | ICD-10-CM | POA: Diagnosis present

## 2021-05-03 DIAGNOSIS — R2689 Other abnormalities of gait and mobility: Secondary | ICD-10-CM | POA: Insufficient documentation

## 2021-05-03 DIAGNOSIS — R2681 Unsteadiness on feet: Secondary | ICD-10-CM | POA: Diagnosis present

## 2021-05-03 DIAGNOSIS — G373 Acute transverse myelitis in demyelinating disease of central nervous system: Secondary | ICD-10-CM | POA: Diagnosis present

## 2021-05-03 NOTE — Therapy (Signed)
[facility] [address] [place], [place], [address] Phone: [phone]   Fax:  [phone]  Physical Therapy Treatment  Patient Details  Name: Maurice Cruz MRN: [id number] Date of Birth: 22-Apr-1966 Referring Provider (PT): Dr Courtney Heys   Encounter Date: 05/03/2021   PT End of Session - 05/03/21 1246     Visit Number 22    Number of Visits 25    Date for PT Re-Evaluation 02/13/21    Authorization Type [employer]- 60 visit limit (has used 20    PT Start Time 1100    PT Stop Time 1140    PT Time Calculation (min) 40 min    Equipment Utilized During Treatment Other (comment)    Activity Tolerance Patient tolerated treatment well;No increased pain    Behavior During Therapy WFL for tasks assessed/performed             Past Medical History:  Diagnosis Date   Anxiety    Asthma    as a child   Depression    ED (erectile dysfunction)    GERD (gastroesophageal reflux disease)    Hx of spinal cord injury 03/2020   Hypertension    Neurogenic bladder    self caths   Neurogenic bowel    has to be digitially stimulated - 04/24/21   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[employer] [facility]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[place])     Past Surgical History:  Procedure Laterality Date   Anorectal biospy  02/09/2021   Colon Biospy  02/09/2021   IR ANGIO INTRA EXTRACRAN SEL COM CAROTID INNOMINATE BILAT MOD SED  04/25/2021   IR ANGIO VERTEBRAL SEL SUBCLAVIAN INNOMINATE UNI L MOD SED  04/25/2021   IR ANGIO VERTEBRAL SEL VERTEBRAL UNI R MOD SED  04/25/2021   IR ANGIO/SPINAL LEFT  04/25/2021   IR ANGIO/SPINAL LEFT  04/25/2021   IR ANGIO/SPINAL LEFT  04/25/2021   IR ANGIO/SPINAL LEFT  04/25/2021   IR ANGIO/SPINAL LEFT  04/25/2021   IR ANGIO/SPINAL LEFT  04/25/2021   IR ANGIO/SPINAL LEFT  04/25/2021   IR ANGIO/SPINAL LEFT  04/25/2021   IR ANGIO/SPINAL LEFT  04/25/2021   IR ANGIO/SPINAL LEFT  04/25/2021   IR ANGIO/SPINAL LEFT  04/25/2021   IR ANGIO/SPINAL LEFT  04/25/2021    IR ANGIO/SPINAL LEFT  04/25/2021   IR ANGIO/SPINAL RIGHT  04/25/2021   IR ANGIO/SPINAL RIGHT  04/25/2021   IR ANGIO/SPINAL RIGHT  04/25/2021   IR ANGIO/SPINAL RIGHT  04/25/2021   IR ANGIO/SPINAL RIGHT  04/25/2021   IR ANGIO/SPINAL RIGHT  04/25/2021   IR ANGIO/SPINAL RIGHT  04/25/2021   IR ANGIO/SPINAL RIGHT  04/25/2021   IR ANGIO/SPINAL RIGHT  04/25/2021   IR ANGIO/SPINAL RIGHT  04/25/2021   IR ANGIO/SPINAL RIGHT  04/25/2021   IR ANGIO/SPINAL RIGHT  04/25/2021   IR ANGIO/SPINAL RIGHT  04/25/2021   IR ANGIO/SPINAL RIGHT  04/25/2021   IR ANGIOGRAM EXTREMITY BILATERAL  04/25/2021   IR RADIOLOGIST EVAL & MGMT  03/21/2021   RADIOLOGY WITH ANESTHESIA N/A 04/25/2021   Procedure: IR WITH ANESTHESIA SPINAL ANGIOGRAM;  Surgeon: Luanne Bras, MD;  Location: [facility];  Service: Radiology;  Laterality: N/A;    There were no vitals filed for this visit.   Subjective Assessment - 05/03/21 1245     Subjective Pt states he is doing well. He does not have pain. The procedure went well and he has no open wound. His R hip feels a little stiffer.    Patient is  accompained by: Family member   dad   Pertinent History Began to notice symptoms in 11/21, had 2 week hospital stay followed by 1 mo. at [facility] in [place].  Was receiving OPPT at [facility] for LE strengthening however referring MD recommended this clinic, has since moved in with parents for physical as well as financial assistance and has obtained and installed a chair lift, denies LE pain but has a hx of low back pain worse with prolonged sitting    Limitations Standing;Walking    How long can you sit comfortably? 30 min    How long can you stand comfortably? 5 min    How long can you walk comfortably? 5 min    Currently in Pain? No/denies    Pain Score 0-No pain               Pt seen for aquatic therapy today.  Treatment took place in water 3.25-4.8 ft in depth at the [facility] pool. Temp of water was 91.  Pt entered/exited the pool  via hydraulic lift.  Seated Assisted stretching of hamstring, gastroc and hip adductors 3 x 30 sec hold Sit to stand without UE support 2x10- cuing for hip extension and controlled eccentric lower.   Standing With ankle buoys: hip flex, hip ext, hip add/abd 2 x 10 reps.  Sidestepping, retro walking 4x each with large noodle (chest deep), standing noodle quad and HS stretch 30s 3x   Step ups on bottom step in 3 ft supported by bilat handrails leading with each LE forward  10x each. VC and TC at hip and knee for support and full extension position      Pt requires buoyancy for support and to offload joints with strengthening exercises. Viscosity of the water is needed for resistance of strengthening; water current perturbations provides challenge to standing balance unsupported, requiring increased core activation     PT Short Term Goals - 02/15/21 0956       PT SHORT TERM GOAL #1   Title Patient to demo initial HEP back to PT w/o need of cuing    Baseline initial HEP issued today; 01/22/21 HEP updated today, initial HEP demoed by patient    Time 4    Period Weeks    Status Achieved    Target Date 01/16/21      PT SHORT TERM GOAL #2   Title patine to able to demo stand pivot and STS transfers with S    Baseline able to perform stand pivot and STS transfers with CGA; 01/22/21 Able to perform STS transfer with UE support and only S    Time 4    Period Weeks    Status Achieved    Target Date 01/16/21      PT SHORT TERM GOAL #3   Title patient able to ambulate 343ft aross level surfaces using RW and light CGA    Baseline 159ft with RW across level surfaces with CGA and WC follow; 01/22/21 Able to ambulate 256ft with RW and SBA/CGA; 02/15/21 Ambulation of 379ft across level ground with light CGA using RW w/o simulated AFO    Time 4    Period Weeks    Status Achieved    Target Date 01/16/21      PT SHORT TERM GOAL #4   Title patient to demo I in bed mobility with focus on control of  RLE    Baseline requires CGA to transition from sit/supine needing to scoop RLE onto bed using  LLE; 01/22/21 PAtient I in bed mobility accomodating for RLE weakness appropriately    Time 4    Period Weeks    Status Achieved    Target Date 01/16/21      PT SHORT TERM GOAL #5   Title Assess BERG and set appropriate goal    Baseline Berg performed and LTG written    Time 4    Period Weeks    Status Achieved    Target Date 01/16/21               PT Long Term Goals - 02/22/21 1811       PT LONG TERM GOAL #1   Title Pt will increase Berg score from 25/56 to >30/56 for improved balance and decreased fall risk. (All LTGs due 02/13/21)    Baseline 12/29/20 25/56; 02/15/21 BERG score 35    Time 8    Period Weeks    Status Achieved      PT LONG TERM GOAL #2   Title Ambulate 568ft with LRAD across level and unlevel ground with S    Baseline 135ft with RW across level ground with CGA; 02/15/21 Ambulation of 353ft in gym across level surface with RW and CGA    Time 8    Period Weeks    Status Partially Met      PT LONG TERM GOAL #3   Title improve RLE strength throughout from 2 to 3/5 to 3+/5 to allow participatin in functional tasks    Baseline 2 to 3/5 RLE sterngth; 02/15/21 3/5 sterngth in R DF, PF and knee extension, 3-/5 knee flexion, 1/5 hip ext., 3-/5; 02/22/21 3/5 R hip flexion during supine march    Time 8    Period Weeks    Status On-going      PT LONG TERM GOAL #4   Title Patient to demo I in all transfers    Baseline CGA stand pivot and STS transfers; 02/15/21 Patient I in bed/ chair transfers, SBA for sit to stand    Time 8    Period Weeks    Status On-going      PT LONG TERM GOAL #5   Title patient will ambulate room to room distances with LRAd under S    Baseline 136ft with RW and CGA    Time 8    Period Weeks    Status On-going      PT LONG TERM GOAL #6   Title Patient to perform 2" step up on RLE using BUE support    Baseline Unable to flex hip against gravity;  02/22/21 No change    Time 8    Period Weeks    Status On-going                   Plan - 05/03/21 1247     Clinical Impression Statement Pt with good tolerance to exercise within pool at today's session. Pt was able to walk with minimal UE support through flotation device and able to progress LE stretching and strengthening at today's session. Pt was able to perform hip extension and hip abd exercise in pool with minimal cuing for hip positioning. Pt still with R hip and knee stiffness that was mildly relieved with self stretching. Pt to be assessed by primary therapist at next session or soon thereafter in order to capture a better understanding of functional therapy progress. Pt would benefit from continued skilled therapy in order to reach goals and maximize functional LE  strength and ROM for full return to PLOF.    Personal Factors and Comorbidities Comorbidity 1    Comorbidities disease process    Stability/Clinical Decision Making Stable/Uncomplicated    Rehab Potential Good    PT Frequency 2x / week    PT Treatment/Interventions ADLs/Self Care Home Management;Aquatic Therapy;Electrical Stimulation;DME Instruction;Gait training;Stair training;Functional mobility training;Therapeutic activities;Therapeutic exercise;Balance training;Neuromuscular re-education;Manual techniques;Wheelchair mobility training;Orthotic Fit/Training;Patient/family education    PT Home Exercise Plan [email or web address]    Consulted and Agree with Plan of Care Patient             Patient will benefit from skilled therapeutic intervention in order to improve the following deficits and impairments:  Abnormal gait, Decreased range of motion, Difficulty walking, Decreased endurance, Decreased activity tolerance, Decreased balance, Decreased mobility, Decreased strength  Visit Diagnosis: Unsteadiness on feet  Muscle weakness (generalized)  Other abnormalities of gait and mobility     Problem List Patient  Active Problem List   Diagnosis Date Noted   Weakness of both lower extremities 02/05/2021   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 [place], [place], [address] Phone: [phone]   Fax:  [phone]  Name: WYATTE DAMES MRN: [id number] Date of Birth: December 10, 1965

## 2021-05-08 ENCOUNTER — Encounter (HOSPITAL_BASED_OUTPATIENT_CLINIC_OR_DEPARTMENT_OTHER): Payer: Self-pay | Admitting: Physical Therapy

## 2021-05-09 ENCOUNTER — Ambulatory Visit (HOSPITAL_BASED_OUTPATIENT_CLINIC_OR_DEPARTMENT_OTHER): Payer: BC Managed Care – PPO | Admitting: Physical Therapy

## 2021-05-10 NOTE — Telephone Encounter (Signed)
I discussed the results of the spinal angiogram with Maurice Cruz.  There is no evidence of a dural AV fistula or other vascular abnormality.  He presents unusually.  He had the sudden episode in 2021 and there was enhancement and edema of the spinal cord as well as abnormal heterogenous signal.  The repeat MRI of the thoracic spine 03/14/2021 shows a small focus of hypointensity adjacent to T9 on the T2 weighted images which could potentially be hemosiderin.  It is not hyperintense as would be expected from a transverse myelitis scar.  I discussed with him that I felt it is more likely that his event was a single stroke/hemorrhage or hemorrhage with transient mass-effect and enhancement rather than a typical inflammatory transverse myelitis  Especially in light of the abnormal serology, I cannot completely rule out a transverse myelitis.  Since he has no other evidence of lupus or other autoimmune disorder, I think it is reasonable to hold off on treatment at this time and to reassess and possibly reimage in about another 6 months.

## 2021-05-21 ENCOUNTER — Encounter (HOSPITAL_BASED_OUTPATIENT_CLINIC_OR_DEPARTMENT_OTHER): Payer: Self-pay | Admitting: Physical Therapy

## 2021-05-21 ENCOUNTER — Encounter: Payer: Self-pay | Admitting: Neurology

## 2021-05-21 ENCOUNTER — Ambulatory Visit: Payer: BC Managed Care – PPO | Admitting: Neurology

## 2021-05-21 ENCOUNTER — Other Ambulatory Visit: Payer: Self-pay

## 2021-05-21 VITALS — BP 171/96 | HR 50 | Ht 70.0 in

## 2021-05-21 DIAGNOSIS — R768 Other specified abnormal immunological findings in serum: Secondary | ICD-10-CM | POA: Diagnosis not present

## 2021-05-21 DIAGNOSIS — N319 Neuromuscular dysfunction of bladder, unspecified: Secondary | ICD-10-CM

## 2021-05-21 DIAGNOSIS — G959 Disease of spinal cord, unspecified: Secondary | ICD-10-CM | POA: Diagnosis not present

## 2021-05-21 DIAGNOSIS — R29898 Other symptoms and signs involving the musculoskeletal system: Secondary | ICD-10-CM | POA: Diagnosis not present

## 2021-05-21 DIAGNOSIS — K592 Neurogenic bowel, not elsewhere classified: Secondary | ICD-10-CM

## 2021-05-21 DIAGNOSIS — G9519 Other vascular myelopathies: Secondary | ICD-10-CM | POA: Insufficient documentation

## 2021-05-21 DIAGNOSIS — R252 Cramp and spasm: Secondary | ICD-10-CM

## 2021-05-21 MED ORDER — DALFAMPRIDINE ER 10 MG PO TB12: ORAL_TABLET | ORAL | 11 refills | Status: DC

## 2021-05-21 NOTE — Progress Notes (Signed)
[facility]  PATIENT: Maurice Cruz DOB: 05-26-1966  REFERRING DOCTOR OR PCP: Chales Salmon, MD (neurology); Glendon Axe, MD (PCP) SOURCE: Patient, notes from Dr. Jannifer Franklin, imaging and laboratory reports.  _________________________________   HISTORICAL  CHIEF COMPLAINT:  Chief Complaint  Patient presents with   Follow-up    New room in wheelchair with mother. Last seen 02/05/21.  No new sx. Here to discuss treatment options.    HISTORY OF PRESENT ILLNESS:   Maurice Cruz is a 55 y.o. man with a spinal cord syndrome.  Update 05/21/2021: I discussed the results of the spinal angiogram with Mr. Calamia.  There is no evidence of a dural AV fistula or other vascular abnormality.  He continues to stay as active as he can.  He does think that he has shown some improvement with better ability to stand up and use the walker in the last couple months.   He had the sudden episode in 2021 and there was enhancement and edema of the spinal cord as well as abnormal heterogenous signal.   The repeat MRI of the thoracic spine 03/14/2021 shows a small focus of hypointensity adjacent to T9 on the T2 weighted images which could potentially be hemosiderin.  It is not hyperintense as would be expected from a transverse myelitis scar.   I discussed with him that I felt it is more likely that his event was a single stroke/hemorrhage or hemorrhage with transient mass-effect and enhancement rather than a typical inflammatory transverse myelitis.      Because of the abnormal serology, I cannot completely rule out a transverse myelitis.  Since he has no other evidence of lupus or other autoimmune disorder, I think it is reasonable to hold off on treatment at this time and to reassess and possibly reimage in about another 6 months.  His left leg is stronger than last year.   He does PT (aquatic at times).   He uses a walker.   The right leg is very weak still.     He denies numbness.  Bladder  function is poor and he needs to self catheterize.    He feels the urge but is unable to void more than just a little bit and needs to self cath x 4 times most days (occ 3 times.        History of the spinal cord syndrome: He presented with low back pain followed by leg weakness and numbness in July 2021.  Symptoms developed over minutes to an hour.   By the time the paramedics arrived, he could not move his legs.   The numbness ws not loss of sensation but his legs felt different.  His local hospital could not do MRI (CT was fine) so he was sent to [facility] in [place].  He had MRIs and lumbar punctures.  He was treated with 5 days of IV Solu-Medrol and IVIG but did not have benefit.  He was then discharged to [facility] rehab and then spent a month there.  He felt he improved some.      He moved to [place] in November.  He was referred to Dr. Jannifer Franklin on 10/20/2020.  She has referred him to see me for a second opinion regarding the possibility of MS.  At the time of his initial visit with me 02/05/2021, his left leg was only mildly weak (70% strength) but the right leg is still very weak (25%).  Sensation improved but not to baseline.  He denies painful dysesthesias currently though he will sometimes experience these in the thoracic spine.Marland Kitchen   He is doing PT at [facility].   With a walker, he can walk > 100 feet without a rest.  He has spasticity and he noted mild benefit but had lightheadedness.  He has needed to self-cath since the onset of symptoms.  Bowel function is poor even with stool softeners and Miralax and lactulose.   Due to continued bowel issues, he sometimes needs Dulcolax or Mg Citrate.    He is taking tizanidine for spasticity (new) but had difficulty tolerating baclofen at the same time as escitalopram.     He notes some depression and is on Prozac (was on Lexapro).    He sleeps ok when he takes a muscle relaxant).     He was working in [employer] for [employer] but haven't  been able to return due to inability to sit prolonged periods of time.     Imaging reports  MRI of the thoracic spine without contrast 04/15/2020 showed segmental hyperintensity within the spinal cord at T6-T7 through T9 and from T9-T10 through T11.  MRI of the thoracic spine with contrast 04/16/2020 showed enhancement that corresponded with the hyperintense changes seen on the 04/15/2020 MRI.  There was also a spot of hypointense changes at T9.  MRI of the cervical spine 04/16/2020 was essentially normal.  MRI of the brain 04/16/2020 was normal.  MRI of the lumbar spine 04/15/2020 showed mild degenerative changes but the conus medullaris appears normal.  MRI of the thoracic spine 03/14/2021 shows lower thoracic spinal cord atrophy.  There is a punctate hyperintensity within the spinal cord at T9.  There was no abnormal enhancement.  There is no longer T2/flair hyperintensity.  Laboratory: CSF 04/17/2020 showed greater than 5 bands that were all observed in both CSF and serum (seen with systemic rather than intracerebral synthesis of gammaglobulins).  CSF protein was slightly elevated at 47.  Other laboratory test 04/15/2020 showed negative HIV, negative RPR, normal vitamin B12.  Hemoglobin A1c was 6.1.  REVIEW OF SYSTEMS: Constitutional: No fevers, chills, sweats, or change in appetite Eyes: No visual changes, double vision, eye pain Ear, nose and throat: No hearing loss, ear pain, nasal congestion, sore throat Cardiovascular: No chest pain, palpitations Respiratory:  No shortness of breath at rest or with exertion.   No wheezes GastrointestinaI: Severe constipation. Genitourinary: Urinary retention. Musculoskeletal:  No neck pain, back pain Integumentary: No rash, pruritus, skin lesions Neurological: as above Psychiatric: No depression at this time.  No anxiety Endocrine: No palpitations, diaphoresis, change in appetite, change in weigh or increased thirst Hematologic/Lymphatic:  No anemia,  purpura, petechiae. Allergic/Immunologic: No itchy/runny eyes, nasal congestion, recent allergic reactions, rashes  ALLERGIES: No Known Allergies  HOME MEDICATIONS:  Current Outpatient Medications:    amLODipine (NORVASC) 5 MG tablet, Take 5 mg by mouth daily., Disp: , Rfl:    carvedilol (COREG) 12.5 MG tablet, Take 12.5 mg by mouth 2 (two) times daily with a meal., Disp: , Rfl:    dalfampridine 10 MG TB12, One po q12 hours, Disp: 60 tablet, Rfl: 11   docusate sodium (COLACE) 100 MG capsule, Take 200 mg by mouth daily., Disp: , Rfl:    FLUoxetine (PROZAC) 10 MG capsule, Take 1 capsule (10 mg total) by mouth daily. For mood, Disp: 30 capsule, Rfl: 5   lactulose (CHRONULAC) 10 GM/15ML solution, Take 20 g by mouth daily., Disp: , Rfl:    losartan (COZAAR) 50  MG tablet, Take 50 mg by mouth daily., Disp: , Rfl:    Magnesium 250 MG TABS, Take 250 mg by mouth daily., Disp: , Rfl:    Multiple Vitamins-Minerals (CENTRUM MEN PO), Take 1 tablet by mouth daily., Disp: , Rfl:    pantoprazole (PROTONIX) 40 MG tablet, Take 40 mg by mouth daily., Disp: , Rfl:    polycarbophil (FIBERCON) 625 MG tablet, Take 1,250 mg by mouth daily., Disp: , Rfl:    rosuvastatin (CRESTOR) 5 MG tablet, Take 5 mg by mouth daily., Disp: , Rfl:    senna (SENOKOT) 8.6 MG TABS tablet, Take 2 tablets by mouth daily., Disp: , Rfl:    sildenafil (VIAGRA) 100 MG tablet, Take 100 mg by mouth See admin instructions. Take 100 mg every 3 days as needed for erectile dysfunction, Disp: , Rfl:    tamsulosin (FLOMAX) 0.4 MG CAPS capsule, Take 1 capsule (0.4 mg total) by mouth daily., Disp: 30 capsule, Rfl: 5   tiZANidine (ZANAFLEX) 4 MG tablet, Take 0.5-1 tablets (2-4 mg total) by mouth 2 (two) times daily. (Patient taking differently: Take 2 mg by mouth at bedtime.), Disp: 60 tablet, Rfl: 5   traMADol (ULTRAM) 50 MG tablet, Take 1 tablet (50 mg total) by mouth every 6 (six) hours as needed. 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[place])     PAST SURGICAL HISTORY: Past Surgical History:  Procedure Laterality Date   Anorectal biospy  02/09/2021   Colon Biospy  02/09/2021   IR ANGIO INTRA EXTRACRAN SEL COM CAROTID INNOMINATE BILAT MOD SED  04/25/2021   IR ANGIO VERTEBRAL SEL SUBCLAVIAN INNOMINATE UNI L MOD SED  04/25/2021   IR ANGIO VERTEBRAL SEL VERTEBRAL UNI R MOD SED  04/25/2021   IR ANGIO/SPINAL LEFT  04/25/2021   IR ANGIO/SPINAL LEFT  04/25/2021   IR ANGIO/SPINAL LEFT  04/25/2021   IR ANGIO/SPINAL LEFT  04/25/2021   IR ANGIO/SPINAL LEFT  04/25/2021   IR ANGIO/SPINAL LEFT  04/25/2021   IR ANGIO/SPINAL LEFT  04/25/2021   IR ANGIO/SPINAL LEFT  04/25/2021   IR ANGIO/SPINAL LEFT  04/25/2021   IR ANGIO/SPINAL LEFT  04/25/2021   IR ANGIO/SPINAL LEFT  04/25/2021   IR ANGIO/SPINAL LEFT  04/25/2021   IR ANGIO/SPINAL LEFT  04/25/2021   IR ANGIO/SPINAL RIGHT  04/25/2021   IR ANGIO/SPINAL RIGHT  04/25/2021   IR ANGIO/SPINAL RIGHT  04/25/2021   IR ANGIO/SPINAL RIGHT  04/25/2021   IR ANGIO/SPINAL RIGHT  04/25/2021   IR ANGIO/SPINAL RIGHT  04/25/2021   IR ANGIO/SPINAL RIGHT  04/25/2021   IR ANGIO/SPINAL RIGHT  04/25/2021   IR ANGIO/SPINAL RIGHT  04/25/2021   IR ANGIO/SPINAL RIGHT  04/25/2021   IR ANGIO/SPINAL RIGHT  04/25/2021   IR ANGIO/SPINAL RIGHT  04/25/2021   IR ANGIO/SPINAL RIGHT  04/25/2021   IR ANGIO/SPINAL RIGHT  04/25/2021   IR ANGIOGRAM EXTREMITY BILATERAL  04/25/2021  IR RADIOLOGIST EVAL & MGMT  03/21/2021   RADIOLOGY WITH ANESTHESIA N/A 04/25/2021    Procedure: IR WITH ANESTHESIA SPINAL ANGIOGRAM;  Surgeon: Luanne Bras, MD;  Location: [facility];  Service: Radiology;  Laterality: N/A;    FAMILY HISTORY: Family History  Problem Relation Age of Onset   Cancer Maternal Grandmother     SOCIAL HISTORY:  Social History   Socioeconomic History   Marital status: Single    Spouse name: Not on file   Number of children: 0   Years of education: HS   Highest education level: Not on file  Occupational History   Occupation: On disability  Tobacco Use   Smoking status: Never   Smokeless tobacco: Never  Substance and Sexual Activity   Alcohol use: Not Currently   Drug use: Never   Sexual activity: Not Currently  Other Topics Concern   Not on file  Social History Narrative   Right handed   1 glass tea per day   Coffee sometimes    No Soda   Lives with parents.   Social Determinants of Health   Financial Resource Strain: Not on file  Food Insecurity: Not on file  Transportation Needs: Not on file  Physical Activity: Not on file  Stress: Not on file  Social Connections: Not on file  Intimate Partner Violence: Not on file     PHYSICAL EXAM  Vitals:   05/21/21 1552  BP: (!) 171/96  Pulse: (!) 50  SpO2: 98%  Height: '5\' 10"'$  (1.778 m)    Body mass index is 24.82 kg/m.   General: The patient is well-developed and well-nourished and in no acute distress  HEENT:  Head is [place]/AT.  Sclera are anicteric.    Neck:  the neck is nontender.  Skin: Extremities are without rash or  edema.  Neurologic Exam  Mental status: The patient is alert and oriented x 3 at the time of the examination. The patient has apparent normal recent and remote memory, with an apparently normal attention span and concentration ability.   Speech is normal.  Cranial nerves: Extraocular movements are full.  Facial strength and sensation was normal.  No obvious hearing deficits are noted.  Motor:  Muscle bulk is normal.   Tone is normal in the  arms and increased in the right leg.. Strength is  5 / 5 in the arms, 4 -/5 in the left iliopsoas and 4/5 elsewhere in the left leg.  Strength was 1/5 in the right iliopsoas and 2 -/5 to 2/5 elsewhere in the right leg.  Sensory: Sensory testing is intact to pinprick, soft touch and vibration sensation in the arms.  He has reduced sensation to touch/temperature below T9/T10 on the right worse .  Normal vibration sensation bilateral   Coordination: Cerebellar testing reveals good finger-nose-finger and unable to do a right leg) left leg.  Gait and station: Station is normal.  He needs support to stand.  Reflexes: Deep tendon reflexes are increased in the legs, nonsustained clonus at the ankles.       ASSESSMENT AND PLAN  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HCC)  Weakness of both lower extremities  Disease of spinal cord (HCC)  Anti-RNP antibodies present  Neurogenic bowel  Neurogenic bladder  Spasticity    I believe spinal cord issue is more likely from a small hemorrhage rather than an inflammatory transverse myelitis.     Spinal Angiogram showed no dural AV fistula.        Ampyra 10 mg bid.  GFR was > 60.   CrCl calculations = 73.   If he does not think he gets a benefit after a month, he should stop    Dysesthesias currently are mild but if they worsen consider gabapentin or lamotrigine. 3.      He will return to see me in 12 months or sooner if there are new or worsening neurologic symptoms.  43-minute office visit with the majority of the time spent face-to-face for history and physical, discussion/counseling and decision-making.  Additional time with record review and documentation.   Annita Ratliff A. [name], MD, [facility] [id number], [id number] PM Certified in Neurology, Clinical Neurophysiology, Sleep Medicine and Neuroimaging  [facility] [address] [place], [place] [address] [phone]

## 2021-05-22 ENCOUNTER — Ambulatory Visit (HOSPITAL_BASED_OUTPATIENT_CLINIC_OR_DEPARTMENT_OTHER): Payer: BC Managed Care – PPO | Admitting: Physical Therapy

## 2021-05-22 ENCOUNTER — Encounter (HOSPITAL_BASED_OUTPATIENT_CLINIC_OR_DEPARTMENT_OTHER): Payer: Self-pay | Admitting: Physical Therapy

## 2021-05-22 DIAGNOSIS — R2681 Unsteadiness on feet: Secondary | ICD-10-CM

## 2021-05-22 DIAGNOSIS — M6281 Muscle weakness (generalized): Secondary | ICD-10-CM

## 2021-05-22 DIAGNOSIS — R2689 Other abnormalities of gait and mobility: Secondary | ICD-10-CM

## 2021-05-22 DIAGNOSIS — G373 Acute transverse myelitis in demyelinating disease of central nervous system: Secondary | ICD-10-CM

## 2021-05-22 NOTE — Therapy (Signed)
[facility] [address] [place], [place], [address] Phone: [phone]   Fax:  [phone]  Physical Therapy Treatment  Patient Details  Name: WENDALL ISABELL MRN: [id number] Date of Birth: May 02, 1966 Referring Provider (PT): Dr Courtney Heys   Encounter Date: 05/22/2021   PT End of Session - 05/22/21 1637     Visit Number 23    Number of Visits 25    Date for PT Re-Evaluation 02/13/21    Authorization Type [employer]- 60 visit limit (has used 20    PT Start Time 0630    PT Stop Time 1614    PT Time Calculation (min) 44 min    Equipment Utilized During Treatment Other (comment)    Activity Tolerance Patient tolerated treatment well;No increased pain    Behavior During Therapy WFL for tasks assessed/performed             Past Medical History:  Diagnosis Date   Anxiety    Asthma    as a child   Depression    ED (erectile dysfunction)    GERD (gastroesophageal reflux disease)    Hx of spinal cord injury 03/2020   Hypertension    Neurogenic bladder    self caths   Neurogenic bowel    has to be digitially stimulated - 04/24/21   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[place])     Past Surgical History:  Procedure Laterality Date   Anorectal biospy  02/09/2021   Colon Biospy  02/09/2021   IR ANGIO INTRA EXTRACRAN SEL COM CAROTID INNOMINATE BILAT MOD SED  04/25/2021   IR ANGIO VERTEBRAL SEL SUBCLAVIAN INNOMINATE UNI L MOD SED  04/25/2021   IR ANGIO VERTEBRAL SEL VERTEBRAL UNI R MOD SED  04/25/2021   IR ANGIO/SPINAL LEFT  04/25/2021   IR ANGIO/SPINAL LEFT  04/25/2021   IR ANGIO/SPINAL LEFT  04/25/2021   IR ANGIO/SPINAL LEFT  04/25/2021   IR ANGIO/SPINAL LEFT  04/25/2021   IR ANGIO/SPINAL LEFT  04/25/2021   IR ANGIO/SPINAL LEFT  04/25/2021   IR ANGIO/SPINAL LEFT  04/25/2021   IR ANGIO/SPINAL LEFT  04/25/2021   IR ANGIO/SPINAL LEFT  04/25/2021   IR ANGIO/SPINAL LEFT  04/25/2021   IR ANGIO/SPINAL LEFT  04/25/2021    IR ANGIO/SPINAL LEFT  04/25/2021   IR ANGIO/SPINAL RIGHT  04/25/2021   IR ANGIO/SPINAL RIGHT  04/25/2021   IR ANGIO/SPINAL RIGHT  04/25/2021   IR ANGIO/SPINAL RIGHT  04/25/2021   IR ANGIO/SPINAL RIGHT  04/25/2021   IR ANGIO/SPINAL RIGHT  04/25/2021   IR ANGIO/SPINAL RIGHT  04/25/2021   IR ANGIO/SPINAL RIGHT  04/25/2021   IR ANGIO/SPINAL RIGHT  04/25/2021   IR ANGIO/SPINAL RIGHT  04/25/2021   IR ANGIO/SPINAL RIGHT  04/25/2021   IR ANGIO/SPINAL RIGHT  04/25/2021   IR ANGIO/SPINAL RIGHT  04/25/2021   IR ANGIO/SPINAL RIGHT  04/25/2021   IR ANGIOGRAM EXTREMITY BILATERAL  04/25/2021   IR RADIOLOGIST EVAL & MGMT  03/21/2021   RADIOLOGY WITH ANESTHESIA N/A 04/25/2021   Procedure: IR WITH ANESTHESIA SPINAL ANGIOGRAM;  Surgeon: Luanne Bras, MD;  Location: [facility];  Service: Radiology;  Laterality: N/A;    There were no vitals filed for this visit.   Subjective Assessment - 05/22/21 1818     Subjective "All is good.  Don't need to go back to neurologist for 1 year.  No pain"    Currently in Pain? No/denies  PT Short Term Goals - 02/15/21 0956       PT SHORT TERM GOAL #1   Title Patient to demo initial HEP back to PT w/o need of cuing    Baseline initial HEP issued today; 01/22/21 HEP updated today, initial HEP demoed by patient    Time 4    Period Weeks    Status Achieved    Target Date 01/16/21      PT SHORT TERM GOAL #2   Title patine to able to demo stand pivot and STS transfers with S    Baseline able to perform stand pivot and STS transfers with CGA; 01/22/21 Able to perform STS transfer with UE support and only S    Time 4    Period Weeks    Status Achieved    Target Date 01/16/21      PT SHORT TERM GOAL #3   Title patient able to ambulate 357f aross level surfaces using RW and light CGA    Baseline 1190fwith RW across level surfaces with CGA and WC follow; 01/22/21 Able to ambulate 23066fith RW and SBA/CGA;  02/15/21 Ambulation of 345f73fross level ground with light CGA using RW w/o simulated AFO    Time 4    Period Weeks    Status Achieved    Target Date 01/16/21      PT SHORT TERM GOAL #4   Title patient to demo I in bed mobility with focus on control of RLE    Baseline requires CGA to transition from sit/supine needing to scoop RLE onto bed using LLE; 01/22/21 PAtient I in bed mobility accomodating for RLE weakness appropriately    Time 4    Period Weeks    Status Achieved    Target Date 01/16/21      PT SHORT TERM GOAL #5   Title Assess BERG and set appropriate goal    Baseline Berg performed and LTG written    Time 4    Period Weeks    Status Achieved    Target Date 01/16/21               PT Long Term Goals - 02/22/21 1811       PT LONG TERM GOAL #1   Title Pt will increase Berg score from 25/56 to >30/56 for improved balance and decreased fall risk. (All LTGs due 02/13/21)    Baseline 12/29/20 25/56; 02/15/21 BERG score 35    Time 8    Period Weeks    Status Achieved      PT LONG TERM GOAL #2   Title Ambulate 500ft7fh LRAD across level and unlevel ground with S    Baseline 115ft 44f RW across level ground with CGA; 02/15/21 Ambulation of 345ft i47fm across level surface with RW and CGA    Time 8    Period Weeks    Status Partially Met      PT LONG TERM GOAL #3   Title improve RLE strength throughout from 2 to 3/5 to 3+/5 to allow participatin in functional tasks    Baseline 2 to 3/5 RLE sterngth; 02/15/21 3/5 sterngth in R DF, PF and knee extension, 3-/5 knee flexion, 1/5 hip ext., 3-/5; 02/22/21 3/5 R hip flexion during supine march    Time 8    Period Weeks    Status On-going      PT LONG TERM GOAL #4   Title Patient to demo I in all transfers  Baseline CGA stand pivot and STS transfers; 02/15/21 Patient I in bed/ chair transfers, SBA for sit to stand    Time 8    Period Weeks    Status On-going      PT LONG TERM GOAL #5   Title patient will ambulate room  to room distances with LRAd under S    Baseline 172ft with RW and CGA    Time 8    Period Weeks    Status On-going      PT LONG TERM GOAL #6   Title Patient to perform 2" step up on RLE using BUE support    Baseline Unable to flex hip against gravity; 02/22/21 No change    Time 8    Period Weeks    Status On-going           Pt seen for aquatic therapy today.  Treatment took place in water 3.25-4.8 ft in depth at the [facility] pool. Temp of water was 91.  Pt entered/exited the pool via hydraulic lift.    Warm up  Water walking forward, back and side holding blue noodle progressed to [facility] then unsupported as tolerated Seated Assisted stretching of hamstring, gastroc and hip adductors 3 x 30 sec hold Sit to stand without UE support x10 from 3rd step rising on 1st step cuing for hip extension and controlled eccentric lower.   Standing Step ups on bottom step in 3 ft supported by bilat handrails leading with each LE forward  10x each. VMO knee extension R/L on bottom step x 10 Side stepping on water step 5 ft submerged. VC and TC at hip and knee for support and full extension position DF;abd;add and hip ext x 10  Prone suspension Core strengthening and stretching of LB core. Knees to chest then R/L 4 x 30 sec.  Knees to chest strengthening x5 then R/L x 5     Pt requires buoyancy for support and to offload joints with strengthening exercises. Viscosity of the water is needed for resistance of strengthening; water current perturbations provides challenge to standing balance unsupported, requiring increased core activation         Plan - 05/22/21 1820     Clinical Impression Statement Challenged pt with VMO strengthening in standing, weight shifting to improve sit to stand as well as stair climbing. Pt able to negotiate 5 steps beginning in 3 ft decreasing to mid calf.  Cuing for alternate pattern. Then turning and descending, cues for controlled decent.  Sit to stand  from 3rd water step stand onto 1st step. Pt able to rise x 5 withour ue support after cueing for weight shift. Pt voices feeling of accomplishment to be able to complete.  Pt able to amb unsupported x 20 ft submerged to waist.    PT Treatment/Interventions ADLs/Self Care Home Management;Aquatic Therapy;Electrical Stimulation;DME Instruction;Gait training;Stair training;Functional mobility training;Therapeutic activities;Therapeutic exercise;Balance training;Neuromuscular re-education;Manual techniques;Wheelchair mobility training;Orthotic Fit/Training;Patient/family education    PT Next Visit Plan Pt due for on land assessment.  Will benefit from continued aquatic therapy intervention. VMO strengtheing/stair climbing.    PT Home Exercise Plan [id number]             Patient will benefit from skilled therapeutic intervention in order to improve the following deficits and impairments:  Abnormal gait, Decreased range of motion, Difficulty walking, Decreased endurance, Decreased activity tolerance, Decreased balance, Decreased mobility, Decreased strength  Visit Diagnosis: Unsteadiness on feet  Muscle weakness (generalized)  Other abnormalities of gait and mobility  Transverse myelitis [facility])     Problem List Patient Active Problem List   Diagnosis Date Noted   Disease of spinal cord ([place]) 05/21/2021   Anti-RNP antibodies present 05/21/2021   Weakness of both lower extremities 02/05/2021   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[place]) 12/08/2020   Neurogenic bladder 12/08/2020   Neurogenic bowel 12/08/2020   Paraplegia following spinal cord injury ([place]) 12/08/2020   Wheelchair dependence 12/08/2020   Spasticity 12/08/2020   Chronic bilateral thoracic back pain 12/08/2020    Vedia Pereyra MPT 05/22/2021, 6:29 PM  [facility] [address] [place], [place], [address] Phone: [phone]   Fax:  [phone]  Name: LIONEL WOODBERRY MRN: [id number] Date  of Birth: Feb 26, 1966

## 2021-05-24 ENCOUNTER — Telehealth: Payer: Self-pay | Admitting: *Deleted

## 2021-05-24 ENCOUNTER — Other Ambulatory Visit: Payer: Self-pay

## 2021-05-24 ENCOUNTER — Ambulatory Visit: Payer: BC Managed Care – PPO | Attending: Family Medicine

## 2021-05-24 ENCOUNTER — Ambulatory Visit: Payer: BC Managed Care – PPO | Admitting: Neurology

## 2021-05-24 DIAGNOSIS — M6281 Muscle weakness (generalized): Secondary | ICD-10-CM | POA: Diagnosis present

## 2021-05-24 DIAGNOSIS — G373 Acute transverse myelitis in demyelinating disease of central nervous system: Secondary | ICD-10-CM | POA: Insufficient documentation

## 2021-05-24 DIAGNOSIS — R2681 Unsteadiness on feet: Secondary | ICD-10-CM | POA: Insufficient documentation

## 2021-05-24 DIAGNOSIS — R2689 Other abnormalities of gait and mobility: Secondary | ICD-10-CM | POA: Insufficient documentation

## 2021-05-24 NOTE — Telephone Encounter (Signed)
Maurice Cruz sent [employer] message 8/22 and has called about getting order for aquatherapy.

## 2021-05-24 NOTE — Patient Instructions (Signed)
Local Driver Evaluation Programs: ° °Comprehensive Evaluation: includes clinical and in vehicle behind the wheel testing by OCCUPATIONAL THERAPIST. Programs have varying levels of adaptive controls available for trial.  ° °[facility] °[address] °[place], [place]  [address] °[phone] or [phone] °http://www.driver-rehab.com °Evaluator:  Cyndee Crompton, OT/CDRS/CDI/SCDCM/Low Vision Certification ° °[facility] °[address] °[place], [place] [address] °[phone] °https://www.[facility].org/home/services/rehabilitation.aspx °Evaluators:  Shannon Sheek, OT and Jill Tucker, OT ° °[facility] [place] [facility] (ONLY SERVES VETERANS!!) °[facility] °[address] °[place], [place]  [address] °[phone] °http://www.[place].va.gov/services/Physical_Medicine_Rehabilitation_Services.asp °Evaluators:  Eric Andrews, KT; Heidi Harris, KT;  Gary Whitaker, KT (KT=kiniesotherapist) ° ° °Clinical evaluations only:  Includes clinical testing, refers to other programs or local certified driving instructor for behind the wheel testing. ° °[facility] (outpatient Rehab) °[facility] °[address] °[place], [place] [address] °[phone] for scheduling °http://www.[facility].edu/Outpatient-Rehabilitation/Neurorehabilitation-Therapy.htm °Evaluators:  Kelly Lambeth, OT; Kate Phillips, OT ° °Other area clinical evaluators available upon request including [facility], [facility] and [facility]. ° ° °    Resource List °What is a Driver Evaluation: °Your Road Ahead - A Guide to Comprehensive Driving Evaluations °http://www.thehartford.com/resources/mature-market-excellence/publications-on-aging ° °Association for [facility] - Disability and Driving Fact Sheets °http://www.aded.net/?page=510 ° °Driving after a Brain  Injury: °[employer] °http://www.biausa.org/tbims-abstracts/if-there-is-an-effective-way-to-determine-if-someone-is-ready-to-drive-after-tbi?A=SearchResult&SearchID=9495675&ObjectID=2758842&ObjectType=35 ° °Driving with Adaptive Equipment: °[facility] °http://www.driver-rehab.com/adaptive-equipment ° °[employer] °http://www.nmeda.com/ ° ° ° ° ° ° °  °

## 2021-05-24 NOTE — Therapy (Signed)
[facility] [address] [place], [place], [address] Phone: [phone]   Fax:  [phone]  Physical Therapy Treatment  Patient Details  Name: Maurice Cruz MRN: [id number] Date of Birth: [birth year] Referring Provider (PT): Dr Courtney Heys   Encounter Date: 05/24/2021   PT End of Session - 05/24/21 1527     Visit Number 24    Number of Visits 25    Date for PT Re-Evaluation 02/13/21    Authorization Type BCBS- 60 visit limit (has used 20    Progress Note Due on Visit 24    PT Start Time 1530    PT Stop Time 1615    PT Time Calculation (min) 45 min    Equipment Utilized During Treatment Other (comment)    Activity Tolerance Patient tolerated treatment well;No increased pain    Behavior During Therapy WFL for tasks assessed/performed             Past Medical History:  Diagnosis Date   Anxiety    Asthma    as a child   Depression    ED (erectile dysfunction)    GERD (gastroesophageal reflux disease)    Hx of spinal cord injury 03/2020   Hypertension    Neurogenic bladder    self caths   Neurogenic bowel    has to be digitially stimulated - 04/24/21   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[place])     Past Surgical History:  Procedure Laterality Date   Anorectal biospy  02/09/2021   Colon Biospy  02/09/2021   IR ANGIO INTRA EXTRACRAN SEL COM CAROTID INNOMINATE BILAT MOD SED  04/25/2021   IR ANGIO VERTEBRAL SEL SUBCLAVIAN INNOMINATE UNI L MOD SED  04/25/2021   IR ANGIO VERTEBRAL SEL VERTEBRAL UNI R MOD SED  04/25/2021   IR ANGIO/SPINAL LEFT  04/25/2021   IR ANGIO/SPINAL LEFT  04/25/2021   IR ANGIO/SPINAL LEFT  04/25/2021   IR ANGIO/SPINAL LEFT  04/25/2021   IR ANGIO/SPINAL LEFT  04/25/2021   IR ANGIO/SPINAL LEFT  04/25/2021   IR ANGIO/SPINAL LEFT  04/25/2021   IR ANGIO/SPINAL LEFT  04/25/2021   IR ANGIO/SPINAL LEFT  04/25/2021   IR ANGIO/SPINAL LEFT  04/25/2021   IR ANGIO/SPINAL LEFT   04/25/2021   IR ANGIO/SPINAL LEFT  04/25/2021   IR ANGIO/SPINAL LEFT  04/25/2021   IR ANGIO/SPINAL RIGHT  04/25/2021   IR ANGIO/SPINAL RIGHT  04/25/2021   IR ANGIO/SPINAL RIGHT  04/25/2021   IR ANGIO/SPINAL RIGHT  04/25/2021   IR ANGIO/SPINAL RIGHT  04/25/2021   IR ANGIO/SPINAL RIGHT  04/25/2021   IR ANGIO/SPINAL RIGHT  04/25/2021   IR ANGIO/SPINAL RIGHT  04/25/2021   IR ANGIO/SPINAL RIGHT  04/25/2021   IR ANGIO/SPINAL RIGHT  04/25/2021   IR ANGIO/SPINAL RIGHT  04/25/2021   IR ANGIO/SPINAL RIGHT  04/25/2021   IR ANGIO/SPINAL RIGHT  04/25/2021   IR ANGIO/SPINAL RIGHT  04/25/2021   IR ANGIOGRAM EXTREMITY BILATERAL  04/25/2021   IR RADIOLOGIST EVAL & MGMT  03/21/2021   RADIOLOGY WITH ANESTHESIA N/A 04/25/2021   Procedure: IR WITH ANESTHESIA SPINAL ANGIOGRAM;  Surgeon: Luanne Bras, MD;  Location: [facility];  Service: Radiology;  Laterality: N/A;    There were no vitals filed for this visit.   Subjective Assessment - 05/24/21 1533     Subjective No pain to report. Has been utilizing aquatic therapy. He now ambulates within home with RW and only uses WC outside the home  Patient is accompained by: Family member   dad   Pertinent History Began to notice symptoms in 11/21, had 2 week hospital stay followed by 1 mo. at [facility] in [place].  Was receiving OPPT at [facility] for LE strengthening however referring MD recommended this clinic, has since moved in with parents for physical as well as financial assistance and has obtained and installed a chair lift, denies LE pain but has a hx of low back pain worse with prolonged sitting    Limitations Standing;Walking    How long can you sit comfortably? 30 min    How long can you stand comfortably? 5 min    How long can you walk comfortably? 5 min                                       PT Education - 05/24/21 1634     Education Details Issued written handout for driving controls    Person(s) Educated Patient    Methods  Explanation;Handout    Comprehension Verbalized understanding              PT Short Term Goals - 02/15/21 0956       PT SHORT TERM GOAL #1   Title Patient to demo initial HEP back to PT w/o need of cuing    Baseline initial HEP issued today; 01/22/21 HEP updated today, initial HEP demoed by patient    Time 4    Period Weeks    Status Achieved    Target Date 01/16/21      PT SHORT TERM GOAL #2   Title patine to able to demo stand pivot and STS transfers with S    Baseline able to perform stand pivot and STS transfers with CGA; 01/22/21 Able to perform STS transfer with UE support and only S    Time 4    Period Weeks    Status Achieved    Target Date 01/16/21      PT SHORT TERM GOAL #3   Title patient able to ambulate 326f aross level surfaces using RW and light CGA    Baseline 1169fwith RW across level surfaces with CGA and WC follow; 01/22/21 Able to ambulate 23069fith RW and SBA/CGA; 02/15/21 Ambulation of 345f56fross level ground with light CGA using RW w/o simulated AFO    Time 4    Period Weeks    Status Achieved    Target Date 01/16/21      PT SHORT TERM GOAL #4   Title patient to demo I in bed mobility with focus on control of RLE    Baseline requires CGA to transition from sit/supine needing to scoop RLE onto bed using LLE; 01/22/21 PAtient I in bed mobility accomodating for RLE weakness appropriately    Time 4    Period Weeks    Status Achieved    Target Date 01/16/21      PT SHORT TERM GOAL #5   Title Assess BERG and set appropriate goal    Baseline Berg performed and LTG written    Time 4    Period Weeks    Status Achieved    Target Date 01/16/21               PT Long Term Goals - 05/24/21 1604       PT LONG TERM GOAL #1   Title Pt will increase  Berg score from 25/56 to >30/56 for improved balance and decreased fall risk. (All LTGs due 02/13/21)    Baseline 12/29/20 25/56; 02/15/21 BERG score 35    Period Weeks    Status Achieved      PT LONG  TERM GOAL #2   Title Ambulate 560f with LRAD across level and unlevel ground with S    Baseline 05/24/21 Ambulation of 5037fin outdoors with S and rollator    Time 12    Period Weeks    Status Revised    Target Date 08/16/21      PT LONG TERM GOAL #3   Title Incerase R hip strength to 3/5 to allow effective swing through in gait cycle    Baseline 2 to 3/5 RLE sterngth; 02/15/21 3/5 sterngth in R DF, PF and knee extension, 3-/5 knee flexion, 1/5 hip ext., 3-/5; 05/24/21 3-/5 R hip flexion during supine march, 3/5 knee ext, 3-/5 knee flexion, 3/5 DF, 3/5 PF    Time 12    Period Weeks    Status Partially Met    Target Date 08/16/21      PT LONG TERM GOAL #4   Title Patient to demo I in all transfers    Baseline Continues to require S for all transfers    Time 12    Period Weeks    Status On-going    Target Date 08/16/21      PT LONG TERM GOAL #6   Title Patient to perform 4" step up on RLE using BUE support 1 or more times    Baseline Able to place R foot on 2" block wth BUE support 1x    Time 12    Period Weeks    Status Revised    Target Date 08/16/21                   Plan - 05/24/21 1634     Clinical Impression Statement Patient seen for assessment of goals and progress towards ambulation and mobility.  All STGs have been met at this time.  Patient has made progress towards LTGs and is now ambulating farther with a less restrictive AD.  RLE strength gains noted as he can now DF against gravity.  R hip flexor remains weakened and hinders his abilituy to swing his RLE through consistently.    Personal Factors and Comorbidities Comorbidity 1    Comorbidities disease process, transverse myelitis    Examination-Activity Limitations Bed Mobility;Locomotion Level;Transfers;Continence;Toileting    Examination-Participation Restrictions Driving    Stability/Clinical Decision Making Stable/Uncomplicated    Rehab Potential Good    PT Frequency 1x / week    PT Duration 12  weeks    PT Treatment/Interventions ADLs/Self Care Home Management;Aquatic Therapy;Electrical Stimulation;DME Instruction;Gait training;Stair training;Functional mobility training;Therapeutic activities;Therapeutic exercise;Balance training;Neuromuscular re-education;Manual techniques;Wheelchair mobility training;Orthotic Fit/Training;Patient/family education    PT Next Visit Plan Resume aquatic therapy and reassess on land as appropriate    PT Home Exercise Plan [id number]           Patient will benefit from skilled therapeutic intervention in order to improve the following deficits and impairments:  Abnormal gait, Decreased range of motion, Difficulty walking, Decreased endurance, Decreased activity tolerance, Decreased balance, Decreased mobility, Decreased strength  Visit Diagnosis: Unsteadiness on feet  Muscle weakness (generalized)  Other abnormalities of gait and mobility  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H[facility])     Problem List Patient Active Problem List   Diagnosis Date Noted   Disease of spinal  cord ([place]) 05/21/2021   Anti-RNP antibodies present 05/21/2021   Weakness of both lower extremities 02/05/2021   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[place]) 12/08/2020   Neurogenic bladder 12/08/2020   Neurogenic bowel 12/08/2020   Paraplegia following spinal cord injury ([place]) 12/08/2020   Wheelchair dependence 12/08/2020   Spasticity 12/08/2020   Chronic bilateral thoracic back pain 12/08/2020    Lanice Shirts PT 05/24/2021, 4:48 PM  [facility] [address] [place], [place], [address] Phone: [phone]   Fax:  [phone]  Name: Maurice Cruz MRN: [id number] Date of Birth: 05-03-1966

## 2021-05-25 ENCOUNTER — Encounter: Payer: Self-pay | Admitting: *Deleted

## 2021-05-29 ENCOUNTER — Ambulatory Visit (HOSPITAL_BASED_OUTPATIENT_CLINIC_OR_DEPARTMENT_OTHER): Payer: BC Managed Care – PPO | Admitting: Physical Therapy

## 2021-05-29 ENCOUNTER — Other Ambulatory Visit: Payer: Self-pay

## 2021-05-29 ENCOUNTER — Encounter (HOSPITAL_BASED_OUTPATIENT_CLINIC_OR_DEPARTMENT_OTHER): Payer: Self-pay | Admitting: Physical Therapy

## 2021-05-29 DIAGNOSIS — R2681 Unsteadiness on feet: Secondary | ICD-10-CM

## 2021-05-29 DIAGNOSIS — M6281 Muscle weakness (generalized): Secondary | ICD-10-CM

## 2021-05-29 DIAGNOSIS — R2689 Other abnormalities of gait and mobility: Secondary | ICD-10-CM

## 2021-05-29 DIAGNOSIS — G373 Acute transverse myelitis in demyelinating disease of central nervous system: Secondary | ICD-10-CM

## 2021-05-29 NOTE — Therapy (Signed)
[facility] [address] [place], [place], [address] Phone: [phone]   Fax:  [phone]  Physical Therapy Treatment  Patient Details  Name: Maurice Cruz MRN: [id number] Date of Birth: 1966-04-01 Referring Provider (PT): Dr Courtney Heys   Encounter Date: 05/29/2021   PT End of Session - 05/29/21 1411     Visit Number 25    Number of Visits 25    Date for PT Re-Evaluation 02/13/21    Authorization Type BCBS- 60 visit limit (has used 20    PT Start Time 1403    PT Stop Time 1450    PT Time Calculation (min) 47 min    Equipment Utilized During Treatment Other (comment)    Activity Tolerance Patient tolerated treatment well;No increased pain    Behavior During Therapy WFL for tasks assessed/performed             Past Medical History:  Diagnosis Date   Anxiety    Asthma    as a child   Depression    ED (erectile dysfunction)    GERD (gastroesophageal reflux disease)    Hx of spinal cord injury 03/2020   Hypertension    Neurogenic bladder    self caths   Neurogenic bowel    has to be digitially stimulated - 04/24/21   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[place])     Past Surgical History:  Procedure Laterality Date   Anorectal biospy  02/09/2021   Colon Biospy  02/09/2021   IR ANGIO INTRA EXTRACRAN SEL COM CAROTID INNOMINATE BILAT MOD SED  04/25/2021   IR ANGIO VERTEBRAL SEL SUBCLAVIAN INNOMINATE UNI L MOD SED  04/25/2021   IR ANGIO VERTEBRAL SEL VERTEBRAL UNI R MOD SED  04/25/2021   IR ANGIO/SPINAL LEFT  04/25/2021   IR ANGIO/SPINAL LEFT  04/25/2021   IR ANGIO/SPINAL LEFT  04/25/2021   IR ANGIO/SPINAL LEFT  04/25/2021   IR ANGIO/SPINAL LEFT  04/25/2021   IR ANGIO/SPINAL LEFT  04/25/2021   IR ANGIO/SPINAL LEFT  04/25/2021   IR ANGIO/SPINAL LEFT  04/25/2021   IR ANGIO/SPINAL LEFT  04/25/2021   IR ANGIO/SPINAL LEFT  04/25/2021   IR ANGIO/SPINAL LEFT  04/25/2021   IR ANGIO/SPINAL LEFT  04/25/2021    IR ANGIO/SPINAL LEFT  04/25/2021   IR ANGIO/SPINAL RIGHT  04/25/2021   IR ANGIO/SPINAL RIGHT  04/25/2021   IR ANGIO/SPINAL RIGHT  04/25/2021   IR ANGIO/SPINAL RIGHT  04/25/2021   IR ANGIO/SPINAL RIGHT  04/25/2021   IR ANGIO/SPINAL RIGHT  04/25/2021   IR ANGIO/SPINAL RIGHT  04/25/2021   IR ANGIO/SPINAL RIGHT  04/25/2021   IR ANGIO/SPINAL RIGHT  04/25/2021   IR ANGIO/SPINAL RIGHT  04/25/2021   IR ANGIO/SPINAL RIGHT  04/25/2021   IR ANGIO/SPINAL RIGHT  04/25/2021   IR ANGIO/SPINAL RIGHT  04/25/2021   IR ANGIO/SPINAL RIGHT  04/25/2021   IR ANGIOGRAM EXTREMITY BILATERAL  04/25/2021   IR RADIOLOGIST EVAL & MGMT  03/21/2021   RADIOLOGY WITH ANESTHESIA N/A 04/25/2021   Procedure: IR WITH ANESTHESIA SPINAL ANGIOGRAM;  Surgeon: Luanne Bras, MD;  Location: [facility];  Service: Radiology;  Laterality: N/A;    There were no vitals filed for this visit.   Subjective Assessment - 05/29/21 1508     Subjective "I was very tired after last aquatic session but no pain"    Patient is accompained by: Family member    Pain Score 0-No pain  PT Short Term Goals - 02/15/21 0956       PT SHORT TERM GOAL #1   Title Patient to demo initial HEP back to PT w/o need of cuing    Baseline initial HEP issued today; 01/22/21 HEP updated today, initial HEP demoed by patient    Time 4    Period Weeks    Status Achieved    Target Date 01/16/21      PT SHORT TERM GOAL #2   Title patine to able to demo stand pivot and STS transfers with S    Baseline able to perform stand pivot and STS transfers with CGA; 01/22/21 Able to perform STS transfer with UE support and only S    Time 4    Period Weeks    Status Achieved    Target Date 01/16/21      PT SHORT TERM GOAL #3   Title patient able to ambulate 368ft aross level surfaces using RW and light CGA    Baseline 183ft with RW across level surfaces with CGA and WC follow; 01/22/21 Able to ambulate 224ft  with RW and SBA/CGA; 02/15/21 Ambulation of 336ft across level ground with light CGA using RW w/o simulated AFO    Time 4    Period Weeks    Status Achieved    Target Date 01/16/21      PT SHORT TERM GOAL #4   Title patient to demo I in bed mobility with focus on control of RLE    Baseline requires CGA to transition from sit/supine needing to scoop RLE onto bed using LLE; 01/22/21 PAtient I in bed mobility accomodating for RLE weakness appropriately    Time 4    Period Weeks    Status Achieved    Target Date 01/16/21      PT SHORT TERM GOAL #5   Title Assess BERG and set appropriate goal    Baseline Berg performed and LTG written    Time 4    Period Weeks    Status Achieved    Target Date 01/16/21               PT Long Term Goals - 05/24/21 1604       PT LONG TERM GOAL #1   Title Pt will increase Berg score from 25/56 to >30/56 for improved balance and decreased fall risk. (All LTGs due 02/13/21)    Baseline 12/29/20 25/56; 02/15/21 BERG score 35    Period Weeks    Status Achieved      PT LONG TERM GOAL #2   Title Ambulate 519ft with LRAD across level and unlevel ground with S    Baseline 05/24/21 Ambulation of 533ft in outdoors with S and rollator    Time 12    Period Weeks    Status Revised    Target Date 08/16/21      PT LONG TERM GOAL #3   Title Incerase R hip strength to 3/5 to allow effective swing through in gait cycle    Baseline 2 to 3/5 RLE sterngth; 02/15/21 3/5 sterngth in R DF, PF and knee extension, 3-/5 knee flexion, 1/5 hip ext., 3-/5; 05/24/21 3-/5 R hip flexion during supine march, 3/5 knee ext, 3-/5 knee flexion, 3/5 DF, 3/5 PF    Time 12    Period Weeks    Status Partially Met    Target Date 08/16/21      PT LONG TERM GOAL #4   Title Patient to demo I in  all transfers    Baseline Continues to require S for all transfers    Time 12    Period Weeks    Status On-going    Target Date 08/16/21      PT LONG TERM GOAL #6   Title Patient to perform 4"  step up on RLE using BUE support 1 or more times    Baseline Able to place R foot on 2" block wth BUE support 1x    Time 12    Period Weeks    Status Revised    Target Date 08/16/21            Pt seen for aquatic therapy today.  Treatment took place in water 3.25-4.8 ft in depth at the [facility] pool. Temp of water was 91.  Pt entered/exited the pool via hydraulic lift.     Warm up  Water walking forward, back and side holding blue noodle progressed to [facility] then unsupported as tolerated  Seated Assisted stretching of hamstring, gastroc and hip adductors 3 x 30 sec hold Sit to stand without UE support x10 from 3rd step rising on 1st step cuing for hip extension and controlled eccentric lower. First 3 with difficulty weight shifting properly, improved with repetition. VC and TC for execution and safety    Standing Step ups on bottom step in 3 ft supported by bilat handrails leading with each LE forward  x 5 each. VMO knee extension 1/3 squats on bottom step x 10 VC and TC at hip and knee for support and full extension position DF x 10; PF x 10;abd;add and hip ext x 10 Bilat gastroc/heel cord  stretch on bottom step 3 x 20 sec hold   Prone suspension Core strengthening and stretching of LB core. Knees to chest then R/L 4 x 30 sec.  Knees to chest strengthening x 10 then R/L x 5     Pt requires buoyancy for support and to offload joints with strengthening exercises. Viscosity of the water is needed for resistance of strengthening; water current perturbations provides challenge to standing balance unsupported, requiring increased core activation    Patient will benefit from skilled therapeutic intervention in order to improve the following deficits and impairments:  Abnormal gait, Decreased range of motion, Difficulty walking, Decreased endurance, Decreased activity tolerance, Decreased balance, Decreased mobility, Decreased strength  Visit Diagnosis: Unsteadiness on  feet  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HCC)  Muscle weakness (generalized)  Other abnormalities of gait and mobility  Clinical assessment Focused on r hip strength with cuing for increased step length with amb, clearing feet from floor(DF to assist with). Right heel cord tightened. Stretching on bottom step inhibited by gr toe spasming into flex. VMO strengthening completd as last visit, limited to right knee popping. (decreases with repetition). Pt continues to put forth excellent effort.   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 [place], [place], [address] Phone: [phone]   Fax:  [phone]  Name: Maurice Cruz MRN: [id number] Date  of Birth: October 29, 1965

## 2021-06-06 ENCOUNTER — Encounter (HOSPITAL_BASED_OUTPATIENT_CLINIC_OR_DEPARTMENT_OTHER): Payer: Self-pay | Admitting: Physical Therapy

## 2021-06-06 ENCOUNTER — Other Ambulatory Visit: Payer: Self-pay

## 2021-06-06 ENCOUNTER — Ambulatory Visit (HOSPITAL_BASED_OUTPATIENT_CLINIC_OR_DEPARTMENT_OTHER): Payer: BC Managed Care – PPO | Attending: Physical Medicine and Rehabilitation | Admitting: Physical Therapy

## 2021-06-06 DIAGNOSIS — M6281 Muscle weakness (generalized): Secondary | ICD-10-CM | POA: Insufficient documentation

## 2021-06-06 DIAGNOSIS — R2681 Unsteadiness on feet: Secondary | ICD-10-CM | POA: Diagnosis present

## 2021-06-06 DIAGNOSIS — R2689 Other abnormalities of gait and mobility: Secondary | ICD-10-CM | POA: Diagnosis present

## 2021-06-06 DIAGNOSIS — G373 Acute transverse myelitis in demyelinating disease of central nervous system: Secondary | ICD-10-CM | POA: Insufficient documentation

## 2021-06-06 NOTE — Therapy (Signed)
[facility] [address] [place], [place], [address] Phone: [phone]   Fax:  [phone]  Physical Therapy Treatment  Patient Details  Name: Maurice Cruz MRN: [id number] Date of Birth: October 29, 1965 Referring Provider (PT): Dr Courtney Heys   Encounter Date: 06/06/2021   PT End of Session - 06/06/21 1404     Visit Number 26    Number of Visits 36    Date for PT Re-Evaluation 07/24/21    PT Start Time 3403    PT Stop Time 1350    PT Time Calculation (min) 45 min    Equipment Utilized During Treatment Other (comment)    Activity Tolerance Patient tolerated treatment well;No increased pain    Behavior During Therapy WFL for tasks assessed/performed             Past Medical History:  Diagnosis Date   Anxiety    Asthma    as a child   Depression    ED (erectile dysfunction)    GERD (gastroesophageal reflux disease)    Hx of spinal cord injury 03/2020   Hypertension    Neurogenic bladder    self caths   Neurogenic bowel    has to be digitially stimulated - 04/24/21   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[place])     Past Surgical History:  Procedure Laterality Date   Anorectal biospy  02/09/2021   Colon Biospy  02/09/2021   IR ANGIO INTRA EXTRACRAN SEL COM CAROTID INNOMINATE BILAT MOD SED  04/25/2021   IR ANGIO VERTEBRAL SEL SUBCLAVIAN INNOMINATE UNI L MOD SED  04/25/2021   IR ANGIO VERTEBRAL SEL VERTEBRAL UNI R MOD SED  04/25/2021   IR ANGIO/SPINAL LEFT  04/25/2021   IR ANGIO/SPINAL LEFT  04/25/2021   IR ANGIO/SPINAL LEFT  04/25/2021   IR ANGIO/SPINAL LEFT  04/25/2021   IR ANGIO/SPINAL LEFT  04/25/2021   IR ANGIO/SPINAL LEFT  04/25/2021   IR ANGIO/SPINAL LEFT  04/25/2021   IR ANGIO/SPINAL LEFT  04/25/2021   IR ANGIO/SPINAL LEFT  04/25/2021   IR ANGIO/SPINAL LEFT  04/25/2021   IR ANGIO/SPINAL LEFT  04/25/2021   IR ANGIO/SPINAL LEFT  04/25/2021   IR ANGIO/SPINAL LEFT  04/25/2021   IR ANGIO/SPINAL  RIGHT  04/25/2021   IR ANGIO/SPINAL RIGHT  04/25/2021   IR ANGIO/SPINAL RIGHT  04/25/2021   IR ANGIO/SPINAL RIGHT  04/25/2021   IR ANGIO/SPINAL RIGHT  04/25/2021   IR ANGIO/SPINAL RIGHT  04/25/2021   IR ANGIO/SPINAL RIGHT  04/25/2021   IR ANGIO/SPINAL RIGHT  04/25/2021   IR ANGIO/SPINAL RIGHT  04/25/2021   IR ANGIO/SPINAL RIGHT  04/25/2021   IR ANGIO/SPINAL RIGHT  04/25/2021   IR ANGIO/SPINAL RIGHT  04/25/2021   IR ANGIO/SPINAL RIGHT  04/25/2021   IR ANGIO/SPINAL RIGHT  04/25/2021   IR ANGIOGRAM EXTREMITY BILATERAL  04/25/2021   IR RADIOLOGIST EVAL & MGMT  03/21/2021   RADIOLOGY WITH ANESTHESIA N/A 04/25/2021   Procedure: IR WITH ANESTHESIA SPINAL ANGIOGRAM;  Surgeon: Luanne Bras, MD;  Location: [facility];  Service: Radiology;  Laterality: N/A;    There were no vitals filed for this visit.   Subjective Assessment - 06/06/21 1829     Subjective Pt visably upset today, crying "I can't do this anymore,can't live like this"  Upper Extremity Functional Index Score :   /80     PT Short Term Goals - 02/15/21 0956       PT SHORT TERM GOAL #1   Title Patient to demo initial HEP back to PT w/o need of cuing    Baseline initial HEP issued today; 01/22/21 HEP updated today, initial HEP demoed by patient    Time 4    Period Weeks    Status Achieved    Target Date 01/16/21      PT SHORT TERM GOAL #2   Title patine to able to demo stand pivot and STS transfers with S    Baseline able to perform stand pivot and STS transfers with CGA; 01/22/21 Able to perform STS transfer with UE support and only S    Time 4    Period Weeks    Status Achieved    Target Date 01/16/21      PT SHORT TERM GOAL #3   Title patient able to ambulate 355f aross level surfaces using RW and light CGA    Baseline 1111fwith RW across level surfaces with CGA and WC follow; 01/22/21 Able to ambulate 23056fith RW and SBA/CGA; 02/15/21 Ambulation of 345f25fross  level ground with light CGA using RW w/o simulated AFO    Time 4    Period Weeks    Status Achieved    Target Date 01/16/21      PT SHORT TERM GOAL #4   Title patient to demo I in bed mobility with focus on control of RLE    Baseline requires CGA to transition from sit/supine needing to scoop RLE onto bed using LLE; 01/22/21 PAtient I in bed mobility accomodating for RLE weakness appropriately    Time 4    Period Weeks    Status Achieved    Target Date 01/16/21      PT SHORT TERM GOAL #5   Title Assess BERG and set appropriate goal    Baseline Berg performed and LTG written    Time 4    Period Weeks    Status Achieved    Target Date 01/16/21               PT Long Term Goals - 05/24/21 1604       PT LONG TERM GOAL #1   Title Pt will increase Berg score from 25/56 to >30/56 for improved balance and decreased fall risk. (All LTGs due 02/13/21)    Baseline 12/29/20 25/56; 02/15/21 BERG score 35    Period Weeks    Status Achieved      PT LONG TERM GOAL #2   Title Ambulate 500ft65fh LRAD across level and unlevel ground with S    Baseline 05/24/21 Ambulation of 500ft 60futdoors with S and rollator    Time 12    Period Weeks    Status Revised    Target Date 08/16/21      PT LONG TERM GOAL #3   Title Incerase R hip strength to 3/5 to allow effective swing through in gait cycle    Baseline 2 to 3/5 RLE sterngth; 02/15/21 3/5 sterngth in R DF, PF and knee extension, 3-/5 knee flexion, 1/5 hip ext., 3-/5; 05/24/21 3-/5 R hip flexion during supine march, 3/5 knee ext, 3-/5 knee flexion, 3/5 DF, 3/5 PF    Time 12    Period Weeks    Status Partially Met    Target Date 08/16/21  PT LONG TERM GOAL #4   Title Patient to demo I in all transfers    Baseline Continues to require S for all transfers    Time 12    Period Weeks    Status On-going    Target Date 08/16/21      PT LONG TERM GOAL #6   Title Patient to perform 4" step up on RLE using BUE support 1 or more times     Baseline Able to place R foot on 2" block wth BUE support 1x    Time 12    Period Weeks    Status Revised    Target Date 08/16/21           Pt seen for aquatic therapy today.  Treatment took place in water 3.25-4.8 ft in depth at the [facility] pool. Temp of water was 91.  Pt entered/exited the pool via hydraulic lift.     Warm up  Water walking forward, back and side holding blue noodle progressed to [facility] then unsupported as tolerated     Standing Noodle kick down 3 x 10 reps R/L, 1 trial in ER of hip Step ups on bottom step in 3 ft supported by bilat handrails leading with each LE forward  x 5 each. VMO knee extension 1/3 squats on bottom step x 10 VC and TC at hip and knee for support and full extension position DF x 10; PF x 10;abd;add and hip ext x 10 Bilat gastroc/heel cord  stretch on bottom step 3 x 20 sec hold     Prone suspension Core strengthening and stretching of LB core completed on 3rd water step. Knees to chest then R/L 4 x 30 sec.  Knees to chest strengthening x 10 then R/L x 5     Pt requires buoyancy for support and to offload joints with strengthening exercises. Viscosity of the water is needed for resistance of strengthening; water current perturbations provides challenge to standing balance unsupported, requiring increased core activation         Plan - 06/06/21 1842     Clinical Impression Statement Pt may benefit from counciling.  He is in agreement.  Will initiate.  He is distracted and upset today due to slow recovery and inability to be indep.  Pt encouraged to follow through on hand controls for car to allow for some independence and socializing.    PT Treatment/Interventions ADLs/Self Care Home Management;Aquatic Therapy;Electrical Stimulation;DME Instruction;Gait training;Stair training;Functional mobility training;Therapeutic activities;Therapeutic exercise;Balance training;Neuromuscular re-education;Manual techniques;Wheelchair mobility  training;Orthotic Fit/Training;Patient/family education    PT Next Visit Plan Enter/exit via steps rather than lift.    PT Home Exercise Plan [id number]             Patient will benefit from skilled therapeutic intervention in order to improve the following deficits and impairments:  Abnormal gait, Decreased range of motion, Difficulty walking, Decreased endurance, Decreased activity tolerance, Decreased balance, Decreased mobility, Decreased strength  Visit Diagnosis: 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[facility])  Unsteadiness on feet  Muscle weakness (generalized)  Other abnormalities of gait and mobility     Problem List Patient Active Problem List   Diagnosis Date Noted   Disease of spinal cord ([place]) 05/21/2021   Anti-RNP antibodies present 05/21/2021   Weakness of both lower extremities 02/05/2021   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[place]) 12/08/2020   Neurogenic bladder 12/08/2020   Neurogenic bowel 12/08/2020   Paraplegia following spinal cord injury ([place]) 12/08/2020   Wheelchair dependence 12/08/2020   Spasticity 12/08/2020   Chronic  bilateral thoracic back pain 12/08/2020    Vedia Pereyra, PT 06/06/2021, 6:46 PM  [facility] [address] [place], [place], [address] Phone: [phone]   Fax:  [phone]  Name: Maurice Cruz MRN: [id number] Date of Birth: 1966/09/22

## 2021-06-13 ENCOUNTER — Other Ambulatory Visit: Payer: Self-pay

## 2021-06-13 ENCOUNTER — Encounter (HOSPITAL_BASED_OUTPATIENT_CLINIC_OR_DEPARTMENT_OTHER): Payer: Self-pay | Admitting: Physical Therapy

## 2021-06-13 ENCOUNTER — Ambulatory Visit (HOSPITAL_BASED_OUTPATIENT_CLINIC_OR_DEPARTMENT_OTHER): Payer: BC Managed Care – PPO | Admitting: Physical Therapy

## 2021-06-13 DIAGNOSIS — R2689 Other abnormalities of gait and mobility: Secondary | ICD-10-CM

## 2021-06-13 DIAGNOSIS — G373 Acute transverse myelitis in demyelinating disease of central nervous system: Secondary | ICD-10-CM | POA: Diagnosis not present

## 2021-06-13 DIAGNOSIS — M6281 Muscle weakness (generalized): Secondary | ICD-10-CM

## 2021-06-13 DIAGNOSIS — R2681 Unsteadiness on feet: Secondary | ICD-10-CM

## 2021-06-13 NOTE — Therapy (Signed)
[facility] [address] [place], [place], [address] Phone: [phone]   Fax:  [phone]  Physical Therapy Treatment  Patient Details  Name: Maurice Cruz MRN: [id number] Date of Birth: 09-17-1966 Referring Provider (PT): Dr Courtney Heys   Encounter Date: 06/13/2021   PT End of Session - 06/13/21 1832     Visit Number 27    Number of Visits 36    Date for PT Re-Evaluation 07/24/21    PT Start Time 1630    PT Stop Time 3291    PT Time Calculation (min) 45 min    Equipment Utilized During Treatment Other (comment)    Activity Tolerance Patient tolerated treatment well;No increased pain    Behavior During Therapy WFL for tasks assessed/performed             Past Medical History:  Diagnosis Date   Anxiety    Asthma    as a child   Depression    ED (erectile dysfunction)    GERD (gastroesophageal reflux disease)    Hx of spinal cord injury 03/2020   Hypertension    Neurogenic bladder    self caths   Neurogenic bowel    has to be digitially stimulated - 04/24/21   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[place])     Past Surgical History:  Procedure Laterality Date   Anorectal biospy  02/09/2021   Colon Biospy  02/09/2021   IR ANGIO INTRA EXTRACRAN SEL COM CAROTID INNOMINATE BILAT MOD SED  04/25/2021   IR ANGIO VERTEBRAL SEL SUBCLAVIAN INNOMINATE UNI L MOD SED  04/25/2021   IR ANGIO VERTEBRAL SEL VERTEBRAL UNI R MOD SED  04/25/2021   IR ANGIO/SPINAL LEFT  04/25/2021   IR ANGIO/SPINAL LEFT  04/25/2021   IR ANGIO/SPINAL LEFT  04/25/2021   IR ANGIO/SPINAL LEFT  04/25/2021   IR ANGIO/SPINAL LEFT  04/25/2021   IR ANGIO/SPINAL LEFT  04/25/2021   IR ANGIO/SPINAL LEFT  04/25/2021   IR ANGIO/SPINAL LEFT  04/25/2021   IR ANGIO/SPINAL LEFT  04/25/2021   IR ANGIO/SPINAL LEFT  04/25/2021   IR ANGIO/SPINAL LEFT  04/25/2021   IR ANGIO/SPINAL LEFT  04/25/2021   IR ANGIO/SPINAL LEFT  04/25/2021   IR ANGIO/SPINAL  RIGHT  04/25/2021   IR ANGIO/SPINAL RIGHT  04/25/2021   IR ANGIO/SPINAL RIGHT  04/25/2021   IR ANGIO/SPINAL RIGHT  04/25/2021   IR ANGIO/SPINAL RIGHT  04/25/2021   IR ANGIO/SPINAL RIGHT  04/25/2021   IR ANGIO/SPINAL RIGHT  04/25/2021   IR ANGIO/SPINAL RIGHT  04/25/2021   IR ANGIO/SPINAL RIGHT  04/25/2021   IR ANGIO/SPINAL RIGHT  04/25/2021   IR ANGIO/SPINAL RIGHT  04/25/2021   IR ANGIO/SPINAL RIGHT  04/25/2021   IR ANGIO/SPINAL RIGHT  04/25/2021   IR ANGIO/SPINAL RIGHT  04/25/2021   IR ANGIOGRAM EXTREMITY BILATERAL  04/25/2021   IR RADIOLOGIST EVAL & MGMT  03/21/2021   RADIOLOGY WITH ANESTHESIA N/A 04/25/2021   Procedure: IR WITH ANESTHESIA SPINAL ANGIOGRAM;  Surgeon: Luanne Bras, MD;  Location: [facility];  Service: Radiology;  Laterality: N/A;    There were no vitals filed for this visit.   Subjective Assessment - 06/13/21 1831     Subjective "Feeling pretty good, no pain"    Currently in Pain? No/denies  PT Short Term Goals - 02/15/21 0956       PT SHORT TERM GOAL #1   Title Patient to demo initial HEP back to PT w/o need of cuing    Baseline initial HEP issued today; 01/22/21 HEP updated today, initial HEP demoed by patient    Time 4    Period Weeks    Status Achieved    Target Date 01/16/21      PT SHORT TERM GOAL #2   Title patine to able to demo stand pivot and STS transfers with S    Baseline able to perform stand pivot and STS transfers with CGA; 01/22/21 Able to perform STS transfer with UE support and only S    Time 4    Period Weeks    Status Achieved    Target Date 01/16/21      PT SHORT TERM GOAL #3   Title patient able to ambulate 356ft aross level surfaces using RW and light CGA    Baseline 174ft with RW across level surfaces with CGA and WC follow; 01/22/21 Able to ambulate 242ft with RW and SBA/CGA; 02/15/21 Ambulation of 365ft across level ground with light CGA using RW w/o simulated AFO     Time 4    Period Weeks    Status Achieved    Target Date 01/16/21      PT SHORT TERM GOAL #4   Title patient to demo I in bed mobility with focus on control of RLE    Baseline requires CGA to transition from sit/supine needing to scoop RLE onto bed using LLE; 01/22/21 PAtient I in bed mobility accomodating for RLE weakness appropriately    Time 4    Period Weeks    Status Achieved    Target Date 01/16/21      PT SHORT TERM GOAL #5   Title Assess BERG and set appropriate goal    Baseline Berg performed and LTG written    Time 4    Period Weeks    Status Achieved    Target Date 01/16/21               PT Long Term Goals - 05/24/21 1604       PT LONG TERM GOAL #1   Title Pt will increase Berg score from 25/56 to >30/56 for improved balance and decreased fall risk. (All LTGs due 02/13/21)    Baseline 12/29/20 25/56; 02/15/21 BERG score 35    Period Weeks    Status Achieved      PT LONG TERM GOAL #2   Title Ambulate 574ft with LRAD across level and unlevel ground with S    Baseline 05/24/21 Ambulation of 515ft in outdoors with S and rollator    Time 12    Period Weeks    Status Revised    Target Date 08/16/21      PT LONG TERM GOAL #3   Title Incerase R hip strength to 3/5 to allow effective swing through in gait cycle    Baseline 2 to 3/5 RLE sterngth; 02/15/21 3/5 sterngth in R DF, PF and knee extension, 3-/5 knee flexion, 1/5 hip ext., 3-/5; 05/24/21 3-/5 R hip flexion during supine march, 3/5 knee ext, 3-/5 knee flexion, 3/5 DF, 3/5 PF    Time 12    Period Weeks    Status Partially Met    Target Date 08/16/21      PT LONG TERM GOAL #4   Title Patient to demo I in  all transfers    Baseline Continues to require S for all transfers    Time 12    Period Weeks    Status On-going    Target Date 08/16/21      PT LONG TERM GOAL #6   Title Patient to perform 4" step up on RLE using BUE support 1 or more times    Baseline Able to place R foot on 2" block wth BUE support 1x     Time 12    Period Weeks    Status Revised    Target Date 08/16/21             Pt seen for aquatic therapy today.  Treatment took place in water 3.25-4.8 ft in depth at the [facility] pool. Temp of water was 91.  Pt entered pool via lift, exited the pool via stairs min-cga.  Warm up: water walking supported by blue noodle 6 widths forward and backward.  [place], Pt with lumbar belt around hips and nek doodle for neck support.  .  Pt assisted into supine floating position by lying head on shoulder of PT to get into floating position. . PT at torso and assisting with trunk left to right and vice versa to engage trunk muscles. PT then rotated trunk in order to engage abdomnal (internal and external obliques)  Emphasis on breathing techniques to draw in abdominals for support.   -Pt then utilizing posterior chain and engaging Hip extension  2 x 5 and knee flexion 2 x 5 using ankle buoys and with water resistance.  Reverse plank/indep supine suspension using ankle buoys and 2 foam hand buoys. Pt able to hold position 3 x 20 seconds -knee flex/ext/pulling knees to chest. Initial difficulty with RLE, good muscle recruitment gaining 80% movement after 5-6 reps. -LE hip/knee flex, pushing off wall with assist of PT to gain position 2 x 5 rep  Stair negotiation 5 steps using handrail alternating step pattern 4 steps (decreasing submergence) the step to last step on land.  Min-cga for safety.  Pt requires buoyancy for support and to offload joints with strengthening exercises. Viscosity of the water is needed for resistance of strengthening; water current perturbations provides challenge to standing balance unsupported, requiring increased core activation.       Plan - 06/13/21 1707     Clinical Impression Statement focus on core strength today.  Pt in supine suspension with foam supporting for core strengthening.  Able to get good oblique firing as well as isolation of glute and  hamstrings.   Excellent ms recruitment with knee flex  and hip ext after several repititions in position.  Pt unable to hold a plank position in prone suspension holding to 2 foam hand buoys and buoys on ankle.  He is able to gainand hold a reverse plank position.  Improvements in strength today as demonstrated by exiting pool via 5 steps rather than lift using combination of alternating pattern and step to (step to on last step unsubmerged)    PT Treatment/Interventions ADLs/Self Care Home Management;Aquatic Therapy;Electrical Stimulation;DME Instruction;Gait training;Stair training;Functional mobility training;Therapeutic activities;Therapeutic exercise;Balance training;Neuromuscular re-education;Manual techniques;Wheelchair mobility training;Orthotic Fit/Training;Patient/family education    PT Next Visit Plan core strengthening    PT Home Exercise Plan [id number]             Patient will benefit from skilled therapeutic intervention in order to improve the following deficits and impairments:  Abnormal gait, Decreased range of motion, Difficulty walking, Decreased endurance, Decreased activity tolerance,  Decreased balance, Decreased mobility, Decreased strength  Visit Diagnosis: 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[facility])  Unsteadiness on feet  Muscle weakness (generalized)  Other abnormalities of gait and mobility     Problem List Patient Active Problem List   Diagnosis Date Noted   Disease of spinal cord ([place]) 05/21/2021   Anti-RNP antibodies present 05/21/2021   Weakness of both lower extremities 02/05/2021   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 [place], [place], [address] Phone: [phone]   Fax:  [phone]  Name: Maurice Cruz MRN: [id number] Date of Birth: [birth year]

## 2021-06-20 ENCOUNTER — Ambulatory Visit (HOSPITAL_BASED_OUTPATIENT_CLINIC_OR_DEPARTMENT_OTHER): Payer: BC Managed Care – PPO | Admitting: Physical Therapy

## 2021-06-20 ENCOUNTER — Other Ambulatory Visit: Payer: Self-pay

## 2021-06-20 ENCOUNTER — Encounter (HOSPITAL_BASED_OUTPATIENT_CLINIC_OR_DEPARTMENT_OTHER): Payer: Self-pay | Admitting: Physical Therapy

## 2021-06-20 DIAGNOSIS — R2689 Other abnormalities of gait and mobility: Secondary | ICD-10-CM

## 2021-06-20 DIAGNOSIS — R2681 Unsteadiness on feet: Secondary | ICD-10-CM

## 2021-06-20 DIAGNOSIS — G373 Acute transverse myelitis in demyelinating disease of central nervous system: Secondary | ICD-10-CM

## 2021-06-20 DIAGNOSIS — M6281 Muscle weakness (generalized): Secondary | ICD-10-CM

## 2021-06-20 NOTE — Therapy (Signed)
[facility] [address] [place], [place], [address] Phone: [phone]   Fax:  [phone]  Physical Therapy Treatment  Patient Details  Name: Maurice Cruz MRN: [id number] Date of Birth: 1966/08/18 Referring Provider (PT): Dr Courtney Heys   Encounter Date: 06/20/2021   PT End of Session - 06/20/21 1446     Visit Number 28    Number of Visits 36    Date for PT Re-Evaluation 07/24/21    Authorization Type BC[employer]- 60 visit limit (has used 20    Authorization - Visit Number 15    Authorization - Number of Visits 40    Progress Note Due on Visit 24    PT Start Time 1450    PT Stop Time 2694    PT Time Calculation (min) 54 min    Equipment Utilized During Treatment Other (comment);Gait belt             Past Medical History:  Diagnosis Date   Anxiety    Asthma    as a child   Depression    ED (erectile dysfunction)    GERD (gastroesophageal reflux disease)    Hx of spinal cord injury 03/2020   Hypertension    Neurogenic bladder    self caths   Neurogenic bowel    has to be digitially stimulated - 04/24/21   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[place])     Past Surgical History:  Procedure Laterality Date   Anorectal biospy  02/09/2021   Colon Biospy  02/09/2021   IR ANGIO INTRA EXTRACRAN SEL COM CAROTID INNOMINATE BILAT MOD SED  04/25/2021   IR ANGIO VERTEBRAL SEL SUBCLAVIAN INNOMINATE UNI L MOD SED  04/25/2021   IR ANGIO VERTEBRAL SEL VERTEBRAL UNI R MOD SED  04/25/2021   IR ANGIO/SPINAL LEFT  04/25/2021   IR ANGIO/SPINAL LEFT  04/25/2021   IR ANGIO/SPINAL LEFT  04/25/2021   IR ANGIO/SPINAL LEFT  04/25/2021   IR ANGIO/SPINAL LEFT  04/25/2021   IR ANGIO/SPINAL LEFT  04/25/2021   IR ANGIO/SPINAL LEFT  04/25/2021   IR ANGIO/SPINAL LEFT  04/25/2021   IR ANGIO/SPINAL LEFT  04/25/2021   IR ANGIO/SPINAL LEFT  04/25/2021   IR ANGIO/SPINAL LEFT  04/25/2021   IR ANGIO/SPINAL LEFT  04/25/2021   IR  ANGIO/SPINAL LEFT  04/25/2021   IR ANGIO/SPINAL RIGHT  04/25/2021   IR ANGIO/SPINAL RIGHT  04/25/2021   IR ANGIO/SPINAL RIGHT  04/25/2021   IR ANGIO/SPINAL RIGHT  04/25/2021   IR ANGIO/SPINAL RIGHT  04/25/2021   IR ANGIO/SPINAL RIGHT  04/25/2021   IR ANGIO/SPINAL RIGHT  04/25/2021   IR ANGIO/SPINAL RIGHT  04/25/2021   IR ANGIO/SPINAL RIGHT  04/25/2021   IR ANGIO/SPINAL RIGHT  04/25/2021   IR ANGIO/SPINAL RIGHT  04/25/2021   IR ANGIO/SPINAL RIGHT  04/25/2021   IR ANGIO/SPINAL RIGHT  04/25/2021   IR ANGIO/SPINAL RIGHT  04/25/2021   IR ANGIOGRAM EXTREMITY BILATERAL  04/25/2021   IR RADIOLOGIST EVAL & MGMT  03/21/2021   RADIOLOGY WITH ANESTHESIA N/A 04/25/2021   Procedure: IR WITH ANESTHESIA SPINAL ANGIOGRAM;  Surgeon: Luanne Bras, MD;  Location: [facility];  Service: Radiology;  Laterality: N/A;    There were no vitals filed for this visit.   Subjective Assessment - 06/20/21 1450     Subjective No questions or concerns.    Currently in Pain? No/denies  PT Short Term Goals - 02/15/21 0956       PT SHORT TERM GOAL #1   Title Patient to demo initial HEP back to PT w/o need of cuing    Baseline initial HEP issued today; 01/22/21 HEP updated today, initial HEP demoed by patient    Time 4    Period Weeks    Status Achieved    Target Date 01/16/21      PT SHORT TERM GOAL #2   Title patine to able to demo stand pivot and STS transfers with S    Baseline able to perform stand pivot and STS transfers with CGA; 01/22/21 Able to perform STS transfer with UE support and only S    Time 4    Period Weeks    Status Achieved    Target Date 01/16/21      PT SHORT TERM GOAL #3   Title patient able to ambulate 331ft aross level surfaces using RW and light CGA    Baseline 146ft with RW across level surfaces with CGA and WC follow; 01/22/21 Able to ambulate 243ft with RW and SBA/CGA; 02/15/21 Ambulation of 365ft across level ground with  light CGA using RW w/o simulated AFO    Time 4    Period Weeks    Status Achieved    Target Date 01/16/21      PT SHORT TERM GOAL #4   Title patient to demo I in bed mobility with focus on control of RLE    Baseline requires CGA to transition from sit/supine needing to scoop RLE onto bed using LLE; 01/22/21 PAtient I in bed mobility accomodating for RLE weakness appropriately    Time 4    Period Weeks    Status Achieved    Target Date 01/16/21      PT SHORT TERM GOAL #5   Title Assess BERG and set appropriate goal    Baseline Berg performed and LTG written    Time 4    Period Weeks    Status Achieved    Target Date 01/16/21               PT Long Term Goals - 05/24/21 1604       PT LONG TERM GOAL #1   Title Pt will increase Berg score from 25/56 to >30/56 for improved balance and decreased fall risk. (All LTGs due 02/13/21)    Baseline 12/29/20 25/56; 02/15/21 BERG score 35    Period Weeks    Status Achieved      PT LONG TERM GOAL #2   Title Ambulate 541ft with LRAD across level and unlevel ground with S    Baseline 05/24/21 Ambulation of 511ft in outdoors with S and rollator    Time 12    Period Weeks    Status Revised    Target Date 08/16/21      PT LONG TERM GOAL #3   Title Incerase R hip strength to 3/5 to allow effective swing through in gait cycle    Baseline 2 to 3/5 RLE sterngth; 02/15/21 3/5 sterngth in R DF, PF and knee extension, 3-/5 knee flexion, 1/5 hip ext., 3-/5; 05/24/21 3-/5 R hip flexion during supine march, 3/5 knee ext, 3-/5 knee flexion, 3/5 DF, 3/5 PF    Time 12    Period Weeks    Status Partially Met    Target Date 08/16/21      PT LONG TERM GOAL #4   Title Patient to demo I in  all transfers    Baseline Continues to require S for all transfers    Time 12    Period Weeks    Status On-going    Target Date 08/16/21      PT LONG TERM GOAL #6   Title Patient to perform 4" step up on RLE using BUE support 1 or more times    Baseline Able to  place R foot on 2" block wth BUE support 1x    Time 12    Period Weeks    Status Revised    Target Date 08/16/21           Pt seen for aquatic therapy today.  Treatment took place in water 3.25-4.8 ft in depth at the [facility] pool. Temp of water was 91.  Pt entered pool via lift, exited the pool via stairs min-cga.   Warm up: water walking supported by blue noodle 6 widths forward and backward.  Step ups bottom water step x 10 forward; x10 backward. Forward pt initiates activity with bilat ue support on handrails then decreases to unilateral.  VMO strengthening: squats x 10  1/3 to floor bialterrally then x 10 lle and 2 x 5 right   [place], Pt with lumbar belt around hips and nek doodle for neck support.  .  Pt assisted into supine floating position by lying head on shoulder of PT to get into floating position. . PT at torso and assisting with trunk left to right and vice versa to engage trunk muscles. PT then rotated trunk in order to engage abdomnal (internal and external obliques)  Emphasis on breathing techniques to draw in abdominals for support.   -Pt then utilizing posterior chain and engaging Hip extension 10 and knee flexion 10 using ankle buoys and with water resistance.  Stair negotiation descending 5 steps using handrail using both step to and  alternating step pattern cga.  Ascending alternating x 4 steps, step to x 1.  Attempted leading with RLE unsubmerged, requiring mod-mas assist.    Pt requires buoyancy for support and to offload joints with strengthening exercises. Viscosity of the water is needed for resistance of strengthening; water current perturbations provides challenge to standing balance unsupported, requiring increased core activation.         Plan - 06/20/21 1506     Clinical Impression Statement Pt negotiates stairs into and then back out of pool today, demonstrating imporvement in ability and le strength, ue supporting on handrails. Pt  continues to get good recruitment with all exercises in bilat hips and knees.  Note: Right hip extensor strength > left,  left knee ext strength > right.    PT Treatment/Interventions ADLs/Self Care Home Management;Aquatic Therapy;Electrical Stimulation;DME Instruction;Gait training;Stair training;Functional mobility training;Therapeutic activities;Therapeutic exercise;Balance training;Neuromuscular re-education;Manual techniques;Wheelchair mobility training;Orthotic Fit/Training;Patient/family education    PT Next Visit Plan core strengthening; VMO strength    PT Home Exercise Plan [id number]             Patient will benefit from skilled therapeutic intervention in order to improve the following deficits and impairments:  Abnormal gait, Decreased range of motion, Difficulty walking, Decreased endurance, Decreased activity tolerance, Decreased balance, Decreased mobility, Decreased strength  Visit Diagnosis: 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[facility])  Unsteadiness on feet  Muscle weakness (generalized)  Other abnormalities of gait and mobility     Problem List Patient Active Problem List   Diagnosis Date Noted   Disease of spinal cord ([facility] [place]) 05/21/2021   Anti-RNP antibodies present 05/21/2021  Weakness of both lower extremities 02/05/2021   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 [place], [place], [address] Phone: [phone]   Fax:  [phone]  Name: Maurice Cruz MRN: [id number] Date of Birth: 10-Dec-1965

## 2021-06-27 ENCOUNTER — Encounter (HOSPITAL_BASED_OUTPATIENT_CLINIC_OR_DEPARTMENT_OTHER): Payer: Self-pay | Admitting: Physical Therapy

## 2021-06-27 ENCOUNTER — Ambulatory Visit (HOSPITAL_BASED_OUTPATIENT_CLINIC_OR_DEPARTMENT_OTHER): Payer: BC Managed Care – PPO | Admitting: Physical Therapy

## 2021-06-27 ENCOUNTER — Other Ambulatory Visit: Payer: Self-pay

## 2021-06-27 DIAGNOSIS — G373 Acute transverse myelitis in demyelinating disease of central nervous system: Secondary | ICD-10-CM

## 2021-06-27 DIAGNOSIS — R2681 Unsteadiness on feet: Secondary | ICD-10-CM

## 2021-06-27 DIAGNOSIS — M6281 Muscle weakness (generalized): Secondary | ICD-10-CM

## 2021-06-27 DIAGNOSIS — R2689 Other abnormalities of gait and mobility: Secondary | ICD-10-CM

## 2021-06-27 NOTE — Therapy (Signed)
[facility] [address] [place], [place], [address] Phone: [phone]   Fax:  [phone]  Physical Therapy Treatment  Patient Details  Name: Maurice Cruz MRN: [id number] Date of Birth: October 10, 1965 Referring Provider (PT): Dr Courtney Heys   Encounter Date: 06/27/2021   PT End of Session - 06/27/21 1413     Visit Number 29    Number of Visits 3    Date for PT Re-Evaluation 07/24/21    Authorization Type [employer]BS- 60 visit limit (has used 20    Authorization - Visit Number 15    Authorization - Number of Visits 40    Progress Note Due on Visit 24    PT Start Time 1415    PT Stop Time 1500    PT Time Calculation (min) 45 min    Equipment Utilized During Treatment Other (comment);Gait belt             Past Medical History:  Diagnosis Date   Anxiety    Asthma    as a child   Depression    ED (erectile dysfunction)    GERD (gastroesophageal reflux disease)    Hx of spinal cord injury 03/2020   Hypertension    Neurogenic bladder    self caths   Neurogenic bowel    has to be digitially stimulated - 04/24/21   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[facility])     Past Surgical History:  Procedure Laterality Date   Anorectal biospy  02/09/2021   Colon Biospy  02/09/2021   IR ANGIO INTRA EXTRACRAN SEL COM CAROTID INNOMINATE BILAT MOD SED  04/25/2021   IR ANGIO VERTEBRAL SEL SUBCLAVIAN INNOMINATE UNI L MOD SED  04/25/2021   IR ANGIO VERTEBRAL SEL VERTEBRAL UNI R MOD SED  04/25/2021   IR ANGIO/SPINAL LEFT  04/25/2021   IR ANGIO/SPINAL LEFT  04/25/2021   IR ANGIO/SPINAL LEFT  04/25/2021   IR ANGIO/SPINAL LEFT  04/25/2021   IR ANGIO/SPINAL LEFT  04/25/2021   IR ANGIO/SPINAL LEFT  04/25/2021   IR ANGIO/SPINAL LEFT  04/25/2021   IR ANGIO/SPINAL LEFT  04/25/2021   IR ANGIO/SPINAL LEFT  04/25/2021   IR ANGIO/SPINAL LEFT  04/25/2021   IR ANGIO/SPINAL LEFT  04/25/2021   IR ANGIO/SPINAL LEFT  04/25/2021   IR  ANGIO/SPINAL LEFT  04/25/2021   IR ANGIO/SPINAL RIGHT  04/25/2021   IR ANGIO/SPINAL RIGHT  04/25/2021   IR ANGIO/SPINAL RIGHT  04/25/2021   IR ANGIO/SPINAL RIGHT  04/25/2021   IR ANGIO/SPINAL RIGHT  04/25/2021   IR ANGIO/SPINAL RIGHT  04/25/2021   IR ANGIO/SPINAL RIGHT  04/25/2021   IR ANGIO/SPINAL RIGHT  04/25/2021   IR ANGIO/SPINAL RIGHT  04/25/2021   IR ANGIO/SPINAL RIGHT  04/25/2021   IR ANGIO/SPINAL RIGHT  04/25/2021   IR ANGIO/SPINAL RIGHT  04/25/2021   IR ANGIO/SPINAL RIGHT  04/25/2021   IR ANGIO/SPINAL RIGHT  04/25/2021   IR ANGIOGRAM EXTREMITY BILATERAL  04/25/2021   IR RADIOLOGIST EVAL & MGMT  03/21/2021   RADIOLOGY WITH ANESTHESIA N/A 04/25/2021   Procedure: IR WITH ANESTHESIA SPINAL ANGIOGRAM;  Surgeon: Luanne Bras, MD;  Location: [facility];  Service: Radiology;  Laterality: N/A;    There were no vitals filed for this visit.   Subjective Assessment - 06/27/21 1436     Subjective "Stomach bothering me no BM since Friday"  PT Short Term Goals - 02/15/21 0956       PT SHORT TERM GOAL #1   Title Patient to demo initial HEP back to PT w/o need of cuing    Baseline initial HEP issued today; 01/22/21 HEP updated today, initial HEP demoed by patient    Time 4    Period Weeks    Status Achieved    Target Date 01/16/21      PT SHORT TERM GOAL #2   Title patine to able to demo stand pivot and STS transfers with S    Baseline able to perform stand pivot and STS transfers with CGA; 01/22/21 Able to perform STS transfer with UE support and only S    Time 4    Period Weeks    Status Achieved    Target Date 01/16/21      PT SHORT TERM GOAL #3   Title patient able to ambulate 364ft aross level surfaces using RW and light CGA    Baseline 156ft with RW across level surfaces with CGA and WC follow; 01/22/21 Able to ambulate 224ft with RW and SBA/CGA; 02/15/21 Ambulation of 335ft across level ground with light CGA using RW  w/o simulated AFO    Time 4    Period Weeks    Status Achieved    Target Date 01/16/21      PT SHORT TERM GOAL #4   Title patient to demo I in bed mobility with focus on control of RLE    Baseline requires CGA to transition from sit/supine needing to scoop RLE onto bed using LLE; 01/22/21 PAtient I in bed mobility accomodating for RLE weakness appropriately    Time 4    Period Weeks    Status Achieved    Target Date 01/16/21      PT SHORT TERM GOAL #5   Title Assess BERG and set appropriate goal    Baseline Berg performed and LTG written    Time 4    Period Weeks    Status Achieved    Target Date 01/16/21               PT Long Term Goals - 05/24/21 1604       PT LONG TERM GOAL #1   Title Pt will increase Berg score from 25/56 to >30/56 for improved balance and decreased fall risk. (All LTGs due 02/13/21)    Baseline 12/29/20 25/56; 02/15/21 BERG score 35    Period Weeks    Status Achieved      PT LONG TERM GOAL #2   Title Ambulate 559ft with LRAD across level and unlevel ground with S    Baseline 05/24/21 Ambulation of 59ft in outdoors with S and rollator    Time 12    Period Weeks    Status Revised    Target Date 08/16/21      PT LONG TERM GOAL #3   Title Incerase R hip strength to 3/5 to allow effective swing through in gait cycle    Baseline 2 to 3/5 RLE sterngth; 02/15/21 3/5 sterngth in R DF, PF and knee extension, 3-/5 knee flexion, 1/5 hip ext., 3-/5; 05/24/21 3-/5 R hip flexion during supine march, 3/5 knee ext, 3-/5 knee flexion, 3/5 DF, 3/5 PF    Time 12    Period Weeks    Status Partially Met    Target Date 08/16/21      PT LONG TERM GOAL #4   Title Patient to demo I in  all transfers    Baseline Continues to require S for all transfers    Time 12    Period Weeks    Status On-going    Target Date 08/16/21      PT LONG TERM GOAL #6   Title Patient to perform 4" step up on RLE using BUE support 1 or more times    Baseline Able to place R foot on 2"  block wth BUE support 1x    Time 12    Period Weeks    Status Revised    Target Date 08/16/21              Pt seen for aquatic therapy today.  Treatment took place in water 3.25-4.8 ft in depth at the [facility] pool. Temp of water was 91.  Pt entered/exited pool via lift.   Warm up: water walking supported by triangular hand buoys x 6 widths forward and backward.     VMO strengthening: squats x 10  1/3 to floor bialterrally then x 10 lle and 2 x 5 right   Core strengthening and balance challenges Holding small triangular hand buoys -hip flex x 10 -hip ext x10 -high knee marching x10 Pulled above together to complete Ai chi # 13 modified. R/L pausing after hip flex and extension x 5    Stair negotiation descending 5 steps using handrail using both step to and  alternating step pattern cga.  Ascending alternating x 4 steps, step to x 1.  Attempted leading with RLE unsubmerged, requiring mod-mas assist.    Pt requires buoyancy for support and to offload joints with strengthening exercises. Viscosity of the water is needed for resistance of strengthening; water current perturbations provides challenge to standing balance unsupported, requiring increased core activation.         Plan - 06/27/21 1435     Clinical Impression Statement Stomach bothering pt today. No BM in 5 days.  Pt demonstrates improvements today as evidenced by progression  to use of ue triangular hand buoys with all balance challenges and without LOB.  Ai Chi #13 modified introduced for core strength and balance chal;lenge. Continued with VMO strengthening.  Upon completion pt reports fatigue.  Does use lift in and out of pool.    Stability/Clinical Decision Making Stable/Uncomplicated    Clinical Decision Making Low    Rehab Potential Good    PT Frequency 1x / week    PT Duration 12 weeks    PT Treatment/Interventions ADLs/Self Care Home Management;Aquatic Therapy;Electrical Stimulation;DME  Instruction;Gait training;Stair training;Functional mobility training;Therapeutic activities;Therapeutic exercise;Balance training;Neuromuscular re-education;Manual techniques;Wheelchair mobility training;Orthotic Fit/Training;Patient/family education    PT Next Visit Plan core strengthening; VMO strength    PT Home Exercise Plan [id number]             Patient will benefit from skilled therapeutic intervention in order to improve the following deficits and impairments:  Abnormal gait, Decreased range of motion, Difficulty walking, Decreased endurance, Decreased activity tolerance, Decreased balance, Decreased mobility, Decreased strength  Visit Diagnosis: 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[facility])  Unsteadiness on feet  Muscle weakness (generalized)  Other abnormalities of gait and mobility     Problem List Patient Active Problem List   Diagnosis Date Noted   Disease of spinal cord ([facility]) 05/21/2021   Anti-RNP antibodies present 05/21/2021   Weakness of both lower extremities 02/05/2021   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 [place], [place], [address] Phone: [phone]   Fax:  [phone]  Name: Maurice Cruz MRN: [id number] Date of Birth: 30-Apr-1966

## 2021-07-02 ENCOUNTER — Telehealth: Payer: Self-pay | Admitting: *Deleted

## 2021-07-02 DIAGNOSIS — G8220 Paraplegia, unspecified: Secondary | ICD-10-CM

## 2021-07-02 NOTE — Telephone Encounter (Signed)
Order placed for OT  referral to [facility] for dribing eval with hand controls. Faxed to [phone].

## 2021-07-04 ENCOUNTER — Ambulatory Visit (HOSPITAL_BASED_OUTPATIENT_CLINIC_OR_DEPARTMENT_OTHER): Payer: Self-pay | Admitting: Physical Therapy

## 2021-07-04 ENCOUNTER — Ambulatory Visit (HOSPITAL_BASED_OUTPATIENT_CLINIC_OR_DEPARTMENT_OTHER): Payer: BC Managed Care – PPO | Admitting: Physical Therapy

## 2021-07-11 ENCOUNTER — Ambulatory Visit (HOSPITAL_BASED_OUTPATIENT_CLINIC_OR_DEPARTMENT_OTHER): Payer: BC Managed Care – PPO | Attending: Physical Medicine and Rehabilitation | Admitting: Physical Therapy

## 2021-07-11 ENCOUNTER — Other Ambulatory Visit: Payer: Self-pay

## 2021-07-11 ENCOUNTER — Encounter (HOSPITAL_BASED_OUTPATIENT_CLINIC_OR_DEPARTMENT_OTHER): Payer: Self-pay | Admitting: Physical Therapy

## 2021-07-11 DIAGNOSIS — M6281 Muscle weakness (generalized): Secondary | ICD-10-CM | POA: Insufficient documentation

## 2021-07-11 DIAGNOSIS — R262 Difficulty in walking, not elsewhere classified: Secondary | ICD-10-CM | POA: Insufficient documentation

## 2021-07-11 DIAGNOSIS — G373 Acute transverse myelitis in demyelinating disease of central nervous system: Secondary | ICD-10-CM | POA: Insufficient documentation

## 2021-07-11 DIAGNOSIS — R293 Abnormal posture: Secondary | ICD-10-CM | POA: Insufficient documentation

## 2021-07-11 DIAGNOSIS — R2689 Other abnormalities of gait and mobility: Secondary | ICD-10-CM | POA: Diagnosis present

## 2021-07-11 DIAGNOSIS — G8929 Other chronic pain: Secondary | ICD-10-CM | POA: Diagnosis present

## 2021-07-11 DIAGNOSIS — R2681 Unsteadiness on feet: Secondary | ICD-10-CM | POA: Diagnosis present

## 2021-07-11 DIAGNOSIS — M25561 Pain in right knee: Secondary | ICD-10-CM | POA: Insufficient documentation

## 2021-07-11 NOTE — Therapy (Signed)
[facility] [address] [place], [place], [address] Phone: [phone]   Fax:  [phone]  Physical Therapy Treatment  Patient Details  Name: Maurice Cruz MRN: [id number] Date of Birth: 1966-08-27 Referring Provider (PT): Dr Courtney Heys   Encounter Date: 07/11/2021   PT End of Session - 07/11/21 1414     Visit Number 30    Number of Visits 36    Date for PT Re-Evaluation 07/24/21    Authorization Type [employer]- 60 visit limit (has used 20    Authorization - Visit Number 15    Authorization - Number of Visits 40    Progress Note Due on Visit 24    PT Start Time 8299    PT Stop Time 1503    PT Time Calculation (min) 48 min    Equipment Utilized During Treatment Other (comment);Gait belt    Activity Tolerance Patient tolerated treatment well;No increased pain    Behavior During Therapy WFL for tasks assessed/performed             Past Medical History:  Diagnosis Date   Anxiety    Asthma    as a child   Depression    ED (erectile dysfunction)    GERD (gastroesophageal reflux disease)    Hx of spinal cord injury 03/2020   Hypertension    Neurogenic bladder    self caths   Neurogenic bowel    has to be digitially stimulated - 04/24/21   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[facility])    bilateral legs   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[place])     Past Surgical History:  Procedure Laterality Date   Anorectal biospy  02/09/2021   Colon Biospy  02/09/2021   IR ANGIO INTRA EXTRACRAN SEL COM CAROTID INNOMINATE BILAT MOD SED  04/25/2021   IR ANGIO VERTEBRAL SEL SUBCLAVIAN INNOMINATE UNI L MOD SED  04/25/2021   IR ANGIO VERTEBRAL SEL VERTEBRAL UNI R MOD SED  04/25/2021   IR ANGIO/SPINAL LEFT  04/25/2021   IR ANGIO/SPINAL LEFT  04/25/2021   IR ANGIO/SPINAL LEFT  04/25/2021   IR ANGIO/SPINAL LEFT  04/25/2021   IR ANGIO/SPINAL LEFT  04/25/2021   IR ANGIO/SPINAL LEFT  04/25/2021   IR ANGIO/SPINAL LEFT  04/25/2021   IR ANGIO/SPINAL LEFT  04/25/2021   IR ANGIO/SPINAL  LEFT  04/25/2021   IR ANGIO/SPINAL LEFT  04/25/2021   IR ANGIO/SPINAL LEFT  04/25/2021   IR ANGIO/SPINAL LEFT  04/25/2021   IR ANGIO/SPINAL LEFT  04/25/2021   IR ANGIO/SPINAL RIGHT  04/25/2021   IR ANGIO/SPINAL RIGHT  04/25/2021   IR ANGIO/SPINAL RIGHT  04/25/2021   IR ANGIO/SPINAL RIGHT  04/25/2021   IR ANGIO/SPINAL RIGHT  04/25/2021   IR ANGIO/SPINAL RIGHT  04/25/2021   IR ANGIO/SPINAL RIGHT  04/25/2021   IR ANGIO/SPINAL RIGHT  04/25/2021   IR ANGIO/SPINAL RIGHT  04/25/2021   IR ANGIO/SPINAL RIGHT  04/25/2021   IR ANGIO/SPINAL RIGHT  04/25/2021   IR ANGIO/SPINAL RIGHT  04/25/2021   IR ANGIO/SPINAL RIGHT  04/25/2021   IR ANGIO/SPINAL RIGHT  04/25/2021   IR ANGIOGRAM EXTREMITY BILATERAL  04/25/2021   IR RADIOLOGIST EVAL & MGMT  03/21/2021   RADIOLOGY WITH ANESTHESIA N/A 04/25/2021   Procedure: IR WITH ANESTHESIA SPINAL ANGIOGRAM;  Surgeon: Luanne Bras, MD;  Location: [facility];  Service: Radiology;  Laterality: N/A;    There were no vitals filed for this visit.   Subjective Assessment - 07/11/21 1510     Subjective "Waiting to hear back from [facility]  for the driving school/instruction."    Currently in Pain? No/denies                                          PT Short Term Goals - 02/15/21 0956       PT SHORT TERM GOAL #1   Title Patient to demo initial HEP back to PT w/o need of cuing    Baseline initial HEP issued today; 01/22/21 HEP updated today, initial HEP demoed by patient    Time 4    Period Weeks    Status Achieved    Target Date 01/16/21      PT SHORT TERM GOAL #2   Title patine to able to demo stand pivot and STS transfers with S    Baseline able to perform stand pivot and STS transfers with CGA; 01/22/21 Able to perform STS transfer with UE support and only S    Time 4    Period Weeks    Status Achieved    Target Date 01/16/21      PT SHORT TERM GOAL #3   Title patient able to ambulate 332ft aross level surfaces using RW and light CGA     Baseline 119ft with RW across level surfaces with CGA and WC follow; 01/22/21 Able to ambulate 224ft with RW and SBA/CGA; 02/15/21 Ambulation of 379ft across level ground with light CGA using RW w/o simulated AFO    Time 4    Period Weeks    Status Achieved    Target Date 01/16/21      PT SHORT TERM GOAL #4   Title patient to demo I in bed mobility with focus on control of RLE    Baseline requires CGA to transition from sit/supine needing to scoop RLE onto bed using LLE; 01/22/21 PAtient I in bed mobility accomodating for RLE weakness appropriately    Time 4    Period Weeks    Status Achieved    Target Date 01/16/21      PT SHORT TERM GOAL #5   Title Assess BERG and set appropriate goal    Baseline Berg performed and LTG written    Time 4    Period Weeks    Status Achieved    Target Date 01/16/21               PT Long Term Goals - 05/24/21 1604       PT LONG TERM GOAL #1   Title Pt will increase Berg score from 25/56 to >30/56 for improved balance and decreased fall risk. (All LTGs due 02/13/21)    Baseline 12/29/20 25/56; 02/15/21 BERG score 35    Period Weeks    Status Achieved      PT LONG TERM GOAL #2   Title Ambulate 535ft with LRAD across level and unlevel ground with S    Baseline 05/24/21 Ambulation of 527ft in outdoors with S and rollator    Time 12    Period Weeks    Status Revised    Target Date 08/16/21      PT LONG TERM GOAL #3   Title Incerase R hip strength to 3/5 to allow effective swing through in gait cycle    Baseline 2 to 3/5 RLE sterngth; 02/15/21 3/5 sterngth in R DF, PF and knee extension, 3-/5 knee flexion, 1/5 hip ext., 3-/5; 05/24/21 3-/5 R hip flexion during  supine march, 3/5 knee ext, 3-/5 knee flexion, 3/5 DF, 3/5 PF    Time 12    Period Weeks    Status Partially Met    Target Date 08/16/21      PT LONG TERM GOAL #4   Title Patient to demo I in all transfers    Baseline Continues to require S for all transfers    Time 12    Period Weeks     Status On-going    Target Date 08/16/21      PT LONG TERM GOAL #6   Title Patient to perform 4" step up on RLE using BUE support 1 or more times    Baseline Able to place R foot on 2" block wth BUE support 1x    Time 12    Period Weeks    Status Revised    Target Date 08/16/21               Pt seen for aquatic therapy today.  Treatment took place in water 3.25-4.8 ft in depth at the [facility] pool. Temp of water was 91.  Pt entered/exited pool via lift.   Warm up: water walking supported by triangular hand buoys x 6 widths forward and backward.  Prioprioceptive challenges -backward lunge 2 x 10 r/L   -3 way tap  -step overs (water step)   VMO strengthening: squats x 10  1/3 to floor bialterrally then x 10 lle and 2 x 5 right   Core strengthening using 2.5 foam hand buoys  -knees to chest 2 x 10 facilitated by therapist to maintain position. -standing<>sup holding x 10 seconds x 2 -stand-supine then knees to chest 2 x 10 facilitated by therapist  Standing Strengthening Step ups x 10 R/L vc for full knee extension/VMO     Stair negotiation descending 5 steps using handrail using both step to and alternating step pattern cga for balance and mod-max assist 2nd and 4th step for advancement of RLE onto step.   Pt requires buoyancy for support and to offload joints with strengthening exercises. Viscosity of the water is needed for resistance of strengthening; water current perturbations provides challenge to standing balance unsupported, requiring increased core activation.         Plan - 07/11/21 1516     Clinical Impression Statement Focused on proprioceptive exercises/chalenges today.  Added ankle buoy to RLE to assist with core strengthening (facilitating movmeent of right knee/hip) submerged.  C/o some LBP with exercises today.  Rle fatigued upon completion although was able to use steps to exit, with asisst on 2nd and 4th step raising r hip. Pt continues to  benefit from aquatics strengthening with less/no fall risk.  He has lost >20 LB (muscle) since onset and reports some increasing depression.  Pt encouraged to get involved with driving lessons to increase indep.    Stability/Clinical Decision Making Stable/Uncomplicated    Clinical Decision Making Low    Rehab Potential Good    PT Frequency 1x / week    PT Duration 12 weeks    PT Treatment/Interventions ADLs/Self Care Home Management;Aquatic Therapy;Electrical Stimulation;DME Instruction;Gait training;Stair training;Functional mobility training;Therapeutic activities;Therapeutic exercise;Balance training;Neuromuscular re-education;Manual techniques;Wheelchair mobility training;Orthotic Fit/Training;Patient/family education    PT Next Visit Plan core strength/further proprioceptive challenges    PT Home Exercise Plan [id number]             Patient will benefit from skilled therapeutic intervention in order to improve the following deficits and impairments:  Abnormal gait, Decreased  range of motion, Difficulty walking, Decreased endurance, Decreased activity tolerance, Decreased balance, Decreased mobility, Decreased strength  Visit Diagnosis: 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[facility])  Unsteadiness on feet  Muscle weakness (generalized)  Other abnormalities of gait and mobility     Problem List Patient Active Problem List   Diagnosis Date Noted   Disease of spinal cord ([place]) 05/21/2021   Anti-RNP antibodies present 05/21/2021   Weakness of both lower extremities 02/05/2021   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[place]) 12/08/2020   Neurogenic bladder 12/08/2020   Neurogenic bowel 12/08/2020   Paraplegia following spinal cord injury ([place]) 12/08/2020   Wheelchair dependence 12/08/2020   Spasticity 12/08/2020   Chronic bilateral thoracic back pain 12/08/2020    Annamarie Major) Beckem Tomberlin MPT  07/11/2021, 3:31 PM  [facility] [place], [place],  [address] Phone: [phone]   Fax:  [phone]  Name: Maurice Cruz MRN: [id number] Date of Birth: 19-Apr-1966

## 2021-07-13 ENCOUNTER — Other Ambulatory Visit: Payer: Self-pay

## 2021-07-13 ENCOUNTER — Encounter
Payer: BC Managed Care – PPO | Attending: Physical Medicine and Rehabilitation | Admitting: Physical Medicine and Rehabilitation

## 2021-07-13 ENCOUNTER — Encounter: Payer: Self-pay | Admitting: Physical Medicine and Rehabilitation

## 2021-07-13 VITALS — BP 192/111 | HR 55 | Temp 98.9°F

## 2021-07-13 DIAGNOSIS — N319 Neuromuscular dysfunction of bladder, unspecified: Secondary | ICD-10-CM

## 2021-07-13 DIAGNOSIS — G8220 Paraplegia, unspecified: Secondary | ICD-10-CM | POA: Diagnosis present

## 2021-07-13 DIAGNOSIS — G373 Acute transverse myelitis in demyelinating disease of central nervous system: Secondary | ICD-10-CM

## 2021-07-13 DIAGNOSIS — Z993 Dependence on wheelchair: Secondary | ICD-10-CM | POA: Diagnosis present

## 2021-07-13 DIAGNOSIS — R252 Cramp and spasm: Secondary | ICD-10-CM

## 2021-07-13 MED ORDER — TRAMADOL HCL 50 MG PO TABS: 50.0000 mg | ORAL_TABLET | Freq: Four times a day (QID) | ORAL | 5 refills | Status: DC | PRN

## 2021-07-13 NOTE — Patient Instructions (Addendum)
Pt is a 55 yr old R handed male with hx of HTN who developed transverse myelitis d'xd in 7/21. With neurogenic bowel and bladder and spasticity-   S/P steroid IV and IVIG- no plasmapheresis. now on meds for BP.  Here for f/u on transverse myelitis/paraplegia. Also has spasticity of RLE and depression.   We discussed cyclophosphamide for transverse myelitis- I think it's better in acute situations-   2. Wrote Rx for hand control- driver's training to use wherever it's easier to get in- hand Rx given.   3. Reill Tramadol- had only gotten 1 refill since March 2022- wrote for 55- UDS will not be helpful since only taken 1 in last 4-7 days- has opiate contract-  will recheck at next visit- 50 mg QID prn- #75 with 5 refills.    4. Saw Urology- prostate OK and still not voiding- so con't cathing as discussed.    5. Having to do bowel med-s Dulocolax tabs- not a bowel program.    6. Doesn't want to go up on Prozac 10 mg daily- will continue.    7. F/U in 3 months- call if any concerns. SCI double appt.    8- think about peer support. I will look into it.

## 2021-07-13 NOTE — Progress Notes (Signed)
Subjective:    Patient ID: Maurice Cruz, male    DOB: 1966/01/30, 55 y.o.   MRN: [id number]  HPI   Pt is a 55 yr old R handed male with hx of HTN who developed transverse myelitis d'xd in 7/21. With neurogenic bowel and bladder and spasticity-   S/P steroid IV and IVIG- no plasmapheresis. now on meds for BP.  Here for f/u on transverse myelitis/paraplegia.   Things about the same-  Had spinal angiogram- no abnormalities- Dr [name] thinks it's a 1x occurrence. No repeat likely.    Will try Dalfampriden- per Dr [name]- but tried it- no improvement.   Doesn't see Dr [name] for 1 more year.   Saw Rheumatology- they suggested Cyclophosophamide for 6 months- can develop cancer- as a risk-   Spirits are low. Due to lack of improvement.   Still doing aquatic therapy- 1x/week- doesn't know how many visits left.   Spasticity stable- so doesn't want to change form Zanaflex.  Pain- using Tramadol a little more than was- has 9 left-    Had to move back home with parents- big adjustment- used to live on own.   On Cholesterol and on Mg now for low Mg- also went to [facility] today for high BP- meds changed- BP 160s/80s at [facility].   Bladder- caths 3-4 times per day and dulocolax tabs for bowel .     Pain Inventory Average Pain 3 Pain Right Now 1 My pain is intermittent and aching  LOCATION OF PAIN  back, hip  BOWEL Number of stools per week: 1-2 Oral laxative use Yes  Type of laxative senna, dulcolax Enema or suppository use No  History of colostomy No  Incontinent No   BLADDER cath In and out cath, frequency 3-4 times per day Able to self cath Yes  Bladder incontinence No  Frequent urination No  Leakage with coughing No  Difficulty starting stream Yes  Incomplete bladder emptying Yes    Mobility walk with assistance use a walker how many minutes can you walk? 5-10 ability to climb steps?  no do you drive?  no use a wheelchair transfers alone  Function disabled:  date disabled 04/16/20 I need assistance with the following:  meal prep and household duties  Neuro/Psych weakness trouble walking spasms depression  Prior Studies Any changes since last visit?  no  Physicians involved in your care Cardiology-Dr. Marton Redwood   Family History  Problem Relation Age of Onset   Cancer Maternal Grandmother    Social History   Socioeconomic History   Marital status: Single    Spouse name: Not on file   Number of children: 0   Years of education: HS   Highest education level: Not on file  Occupational History   Occupation: On disability  Tobacco Use   Smoking status: Never   Smokeless tobacco: Never  Substance and Sexual Activity   Alcohol use: Not Currently   Drug use: Never   Sexual activity: Not Currently  Other Topics Concern   Not on file  Social History Narrative   Right handed   1 glass tea per day   Coffee sometimes    No Soda   Lives with parents.   Social Determinants of Health   Financial Resource Strain: Not on file  Food Insecurity: Not on file  Transportation Needs: Not on file  Physical Activity: Not on file  Stress: Not on file  Social Connections: Not on file   Past Surgical History:  Procedure Laterality Date   Anorectal biospy  02/09/2021   Colon Biospy  02/09/2021   IR ANGIO INTRA EXTRACRAN SEL COM CAROTID INNOMINATE BILAT MOD SED  04/25/2021   IR ANGIO VERTEBRAL SEL SUBCLAVIAN INNOMINATE UNI L MOD SED  04/25/2021   IR ANGIO VERTEBRAL SEL VERTEBRAL UNI R MOD SED  04/25/2021   IR ANGIO/SPINAL LEFT  04/25/2021   IR ANGIO/SPINAL LEFT  04/25/2021   IR ANGIO/SPINAL LEFT  04/25/2021   IR ANGIO/SPINAL LEFT  04/25/2021   IR ANGIO/SPINAL LEFT  04/25/2021   IR ANGIO/SPINAL LEFT  04/25/2021   IR ANGIO/SPINAL LEFT  04/25/2021   IR ANGIO/SPINAL LEFT  04/25/2021   IR ANGIO/SPINAL LEFT  04/25/2021   IR ANGIO/SPINAL LEFT  04/25/2021   IR ANGIO/SPINAL LEFT  04/25/2021   IR ANGIO/SPINAL LEFT  04/25/2021   IR ANGIO/SPINAL LEFT   04/25/2021   IR ANGIO/SPINAL RIGHT  04/25/2021   IR ANGIO/SPINAL RIGHT  04/25/2021   IR ANGIO/SPINAL RIGHT  04/25/2021   IR ANGIO/SPINAL RIGHT  04/25/2021   IR ANGIO/SPINAL RIGHT  04/25/2021   IR ANGIO/SPINAL RIGHT  04/25/2021   IR ANGIO/SPINAL RIGHT  04/25/2021   IR ANGIO/SPINAL RIGHT  04/25/2021   IR ANGIO/SPINAL RIGHT  04/25/2021   IR ANGIO/SPINAL RIGHT  04/25/2021   IR ANGIO/SPINAL RIGHT  04/25/2021   IR ANGIO/SPINAL RIGHT  04/25/2021   IR ANGIO/SPINAL RIGHT  04/25/2021   IR ANGIO/SPINAL RIGHT  04/25/2021   IR ANGIOGRAM EXTREMITY BILATERAL  04/25/2021   IR RADIOLOGIST EVAL & MGMT  03/21/2021   RADIOLOGY WITH ANESTHESIA N/A 04/25/2021   Procedure: IR WITH ANESTHESIA SPINAL ANGIOGRAM;  Surgeon: Luanne Bras, MD;  Location: [facility];  Service: Radiology;  Laterality: N/A;   Past Medical History:  Diagnosis Date   Anxiety    Asthma    as a child   Depression    ED (erectile dysfunction)    GERD (gastroesophageal reflux disease)    Hx of spinal cord injury 03/2020   Hypertension    Neurogenic bladder    self caths   Neurogenic bowel    has to be digitially stimulated - 04/24/21   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[place])    bilateral legs   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[facility])    BP (!) 192/111   Pulse (!) 55   Temp 98.9 F (37.2 C) (Oral)   SpO2 97%   Opioid Risk Score:   Fall Risk Score:  `1  Depression screen PHQ 2/9  Depression screen PHQ 2/9 12/08/2020  Decreased Interest 3  Down, Depressed, Hopeless 3  PHQ - 2 Score 6  Altered sleeping 1  Tired, decreased energy 1  Change in appetite 2  Feeling bad or failure about yourself  3  Trouble concentrating 0  Moving slowly or fidgety/restless 0  Suicidal thoughts 0  PHQ-9 Score 13  Difficult doing work/chores Very difficult     Review of Systems An review of systems was completed and found to be negative except for HPI.     Objective:   Physical Exam  Awake, alert, appropriate, depressed; tearful at times, NAD In manual w/c- axiom w/c  cushion  Neuro 5-6 beats clonus RLE only- MAS of 1+ to 2 in RLE  MS: RLE_ HF trace; KE 2/5; DF- 2+/5 and PF 2+/5 LLE- 4+/5 in same muscles        Assessment & Plan:    Pt is a 55 yr old R handed male with hx of HTN who developed transverse myelitis d'xd in 7/21. With  neurogenic bowel and bladder and spasticity-   S/P steroid IV and IVIG- no plasmapheresis. now on meds for BP.  Here for f/u on transverse myelitis/paraplegia. Also has spasticity of RLE and depression.   We discussed cyclophosphamide for transverse myelitis- I think it's better in acute situations-   2. Wrote Rx for hand control- driver's training to use wherever it's easier to get in- hand Rx given.   3. Reill Tramadol- had only gotten 1 refill since March 2022- wrote for 3- UDS will not be helpful since only taken 1 in last 4-7 days- has opiate contract-  will recheck at next visit- 50 mg QID prn- #75 with 5 refills.    4. Saw Urology- prostate OK and still not voiding- so con't cathing as discussed.    5. Having to do bowel med-s Dulocolax tabs- not a bowel program.    6. Doesn't want to go up on Prozac 10 mg daily- will continue.    7. F/U in 3 months- call if any concerns. Double appt.    8- think about peer support. I will look into it.   I spent a total of 33 minutes on visit- discussing grief over SCI/paraplegia since everyone keeps "hopeful". As well as what's documented above.

## 2021-07-18 ENCOUNTER — Other Ambulatory Visit: Payer: Self-pay

## 2021-07-18 ENCOUNTER — Encounter (HOSPITAL_BASED_OUTPATIENT_CLINIC_OR_DEPARTMENT_OTHER): Payer: Self-pay | Admitting: Physical Therapy

## 2021-07-18 ENCOUNTER — Ambulatory Visit (HOSPITAL_BASED_OUTPATIENT_CLINIC_OR_DEPARTMENT_OTHER): Payer: BC Managed Care – PPO | Admitting: Physical Therapy

## 2021-07-18 DIAGNOSIS — M6281 Muscle weakness (generalized): Secondary | ICD-10-CM

## 2021-07-18 DIAGNOSIS — R293 Abnormal posture: Secondary | ICD-10-CM

## 2021-07-18 DIAGNOSIS — G8929 Other chronic pain: Secondary | ICD-10-CM

## 2021-07-18 DIAGNOSIS — R2681 Unsteadiness on feet: Secondary | ICD-10-CM

## 2021-07-18 DIAGNOSIS — M25561 Pain in right knee: Secondary | ICD-10-CM

## 2021-07-18 DIAGNOSIS — R262 Difficulty in walking, not elsewhere classified: Secondary | ICD-10-CM

## 2021-07-18 DIAGNOSIS — G373 Acute transverse myelitis in demyelinating disease of central nervous system: Secondary | ICD-10-CM | POA: Diagnosis not present

## 2021-07-18 DIAGNOSIS — R2689 Other abnormalities of gait and mobility: Secondary | ICD-10-CM

## 2021-07-18 NOTE — Therapy (Signed)
[facility] [address] [place], [place], [address] Phone: [phone]   Fax:  [phone]  Physical Therapy Treatment  Patient Details  Name: Maurice Cruz MRN: [id number] Date of Birth: [birth year] Referring Provider (PT): Dr Courtney Heys   Encounter Date: 07/18/2021   PT End of Session - 07/18/21 1420     Visit Number 31    Number of Visits 36    Date for PT Re-Evaluation 07/24/21    Authorization Type BCBS- 60 visit limit (has used 20    Authorization - Visit Number 15    Authorization - Number of Visits 40    Progress Note Due on Visit 24    PT Start Time 1415    PT Stop Time 1500    PT Time Calculation (min) 45 min    Equipment Utilized During Treatment Other (comment);Gait belt    Activity Tolerance Patient tolerated treatment well;No increased pain    Behavior During Therapy WFL for tasks assessed/performed             Past Medical History:  Diagnosis Date   Anxiety    Asthma    as a child   Depression    ED (erectile dysfunction)    GERD (gastroesophageal reflux disease)    Hx of spinal cord injury 03/2020   Hypertension    Neurogenic bladder    self caths   Neurogenic bowel    has to be digitially stimulated - 04/24/21   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[place])     Past Surgical History:  Procedure Laterality Date   Anorectal biospy  02/09/2021   Colon Biospy  02/09/2021   IR ANGIO INTRA EXTRACRAN SEL COM CAROTID INNOMINATE BILAT MOD SED  04/25/2021   IR ANGIO VERTEBRAL SEL SUBCLAVIAN INNOMINATE UNI L MOD SED  04/25/2021   IR ANGIO VERTEBRAL SEL VERTEBRAL UNI R MOD SED  04/25/2021   IR ANGIO/SPINAL LEFT  04/25/2021   IR ANGIO/SPINAL LEFT  04/25/2021   IR ANGIO/SPINAL LEFT  04/25/2021   IR ANGIO/SPINAL LEFT  04/25/2021   IR ANGIO/SPINAL LEFT  04/25/2021   IR ANGIO/SPINAL LEFT  04/25/2021   IR ANGIO/SPINAL LEFT  04/25/2021   IR ANGIO/SPINAL LEFT  04/25/2021   IR ANGIO/SPINAL  LEFT  04/25/2021   IR ANGIO/SPINAL LEFT  04/25/2021   IR ANGIO/SPINAL LEFT  04/25/2021   IR ANGIO/SPINAL LEFT  04/25/2021   IR ANGIO/SPINAL LEFT  04/25/2021   IR ANGIO/SPINAL RIGHT  04/25/2021   IR ANGIO/SPINAL RIGHT  04/25/2021   IR ANGIO/SPINAL RIGHT  04/25/2021   IR ANGIO/SPINAL RIGHT  04/25/2021   IR ANGIO/SPINAL RIGHT  04/25/2021   IR ANGIO/SPINAL RIGHT  04/25/2021   IR ANGIO/SPINAL RIGHT  04/25/2021   IR ANGIO/SPINAL RIGHT  04/25/2021   IR ANGIO/SPINAL RIGHT  04/25/2021   IR ANGIO/SPINAL RIGHT  04/25/2021   IR ANGIO/SPINAL RIGHT  04/25/2021   IR ANGIO/SPINAL RIGHT  04/25/2021   IR ANGIO/SPINAL RIGHT  04/25/2021   IR ANGIO/SPINAL RIGHT  04/25/2021   IR ANGIOGRAM EXTREMITY BILATERAL  04/25/2021   IR RADIOLOGIST EVAL & MGMT  03/21/2021   RADIOLOGY WITH ANESTHESIA N/A 04/25/2021   Procedure: IR WITH ANESTHESIA SPINAL ANGIOGRAM;  Surgeon: Luanne Bras, MD;  Location: [facility];  Service: Radiology;  Laterality: N/A;    There were no vitals filed for this visit.   Subjective Assessment - 07/18/21 1739     Subjective "Saw Dr Dagoberto Ligas, told her that  I am frustrated because people keep telling me I am going to get better but I feell like I am where I am going to be. She agreed"    Currently in Pain? No/denies                                          PT Short Term Goals - 02/15/21 0956       PT SHORT TERM GOAL #1   Title Patient to demo initial HEP back to PT w/o need of cuing    Baseline initial HEP issued today; 01/22/21 HEP updated today, initial HEP demoed by patient    Time 4    Period Weeks    Status Achieved    Target Date 01/16/21      PT SHORT TERM GOAL #2   Title patine to able to demo stand pivot and STS transfers with S    Baseline able to perform stand pivot and STS transfers with CGA; 01/22/21 Able to perform STS transfer with UE support and only S    Time 4    Period Weeks    Status Achieved    Target Date 01/16/21      PT SHORT TERM GOAL #3    Title patient able to ambulate 338ft aross level surfaces using RW and light CGA    Baseline 171ft with RW across level surfaces with CGA and WC follow; 01/22/21 Able to ambulate 259ft with RW and SBA/CGA; 02/15/21 Ambulation of 352ft across level ground with light CGA using RW w/o simulated AFO    Time 4    Period Weeks    Status Achieved    Target Date 01/16/21      PT SHORT TERM GOAL #4   Title patient to demo I in bed mobility with focus on control of RLE    Baseline requires CGA to transition from sit/supine needing to scoop RLE onto bed using LLE; 01/22/21 PAtient I in bed mobility accomodating for RLE weakness appropriately    Time 4    Period Weeks    Status Achieved    Target Date 01/16/21      PT SHORT TERM GOAL #5   Title Assess BERG and set appropriate goal    Baseline Berg performed and LTG written    Time 4    Period Weeks    Status Achieved    Target Date 01/16/21               PT Long Term Goals - 05/24/21 1604       PT LONG TERM GOAL #1   Title Pt will increase Berg score from 25/56 to >30/56 for improved balance and decreased fall risk. (All LTGs due 02/13/21)    Baseline 12/29/20 25/56; 02/15/21 BERG score 35    Period Weeks    Status Achieved      PT LONG TERM GOAL #2   Title Ambulate 575ft with LRAD across level and unlevel ground with S    Baseline 05/24/21 Ambulation of 546ft in outdoors with S and rollator    Time 12    Period Weeks    Status Revised    Target Date 08/16/21      PT LONG TERM GOAL #3   Title Incerase R hip strength to 3/5 to allow effective swing through in gait cycle    Baseline 2 to 3/5 RLE  sterngth; 02/15/21 3/5 sterngth in R DF, PF and knee extension, 3-/5 knee flexion, 1/5 hip ext., 3-/5; 05/24/21 3-/5 R hip flexion during supine march, 3/5 knee ext, 3-/5 knee flexion, 3/5 DF, 3/5 PF    Time 12    Period Weeks    Status Partially Met    Target Date 08/16/21      PT LONG TERM GOAL #4   Title Patient to demo I in all transfers     Baseline Continues to require S for all transfers    Time 12    Period Weeks    Status On-going    Target Date 08/16/21      PT LONG TERM GOAL #6   Title Patient to perform 4" step up on RLE using BUE support 1 or more times    Baseline Able to place R foot on 2" block wth BUE support 1x    Time 12    Period Weeks    Status Revised    Target Date 08/16/21                Pt seen for aquatic therapy today.  Treatment took place in water 3.25-4.8 ft in depth at the [facility] pool. Temp of water was 91.  Pt entered/exited pool via lift.   Warm up: water walking supported by hand buoys x 6 widths forward and backward.   Prioprioceptive challenges -backward lunge 2 x 10 r/L   -3 way tap  -step overs (water step)   VMO strengthening: squats x 10  1/3 to floor bilaterally then x 10 lle and 2 x 5 right. 90ft.   Core strengthening using 2.5 foam hand buoys  -knee to chest in vertical  L/R x 10, then alternating x 10. -standing<>sup holding x 10 seconds x 5, facilitated by therapist  Gait training: Donned right ankle buoy. Forward, bwd and side stepping x 4 widths each   Standing Strengthening -marching x10 -add/abd x10      Stair negotiation ascending 5 steps using handrail using both step to and alternating step pattern cga for balance and mod-max assist 2nd and 4th step for advancement of RLE onto step. Increased difficulty today due to right extension tone with effort.   Pt requires buoyancy for support and to offload joints with strengthening exercises. Viscosity of the water is needed for resistance of strengthening; water current perturbations provides challenge to standing balance unsupported, requiring increased core activation.         Plan - 07/18/21 1742     Clinical Impression Statement Pt voicing frustration but is looking forward to driving.  He completes all prescribed exercises with good toleration and patience. Continues to gain good muscle  recruitment in core and LE's but after several repititons/slowly. Improved VMO strength as demo with completion of squats, pt with 60% (as per pt) control as complaredRight to left.  Discussed going forward with therapy visits and eventually completing HEP if/when access to pool.    Stability/Clinical Decision Making Stable/Uncomplicated    Clinical Decision Making Low    Rehab Potential Good    PT Frequency 1x / week    PT Duration 12 weeks    PT Treatment/Interventions ADLs/Self Care Home Management;Aquatic Therapy;Electrical Stimulation;DME Instruction;Gait training;Stair training;Functional mobility training;Therapeutic activities;Therapeutic exercise;Balance training;Neuromuscular re-education;Manual techniques;Wheelchair mobility training;Orthotic Fit/Training;Patient/family education    PT Home Exercise Plan [id number]             Patient will benefit from skilled therapeutic intervention in order to  improve the following deficits and impairments:  Abnormal gait, Decreased range of motion, Difficulty walking, Decreased endurance, Decreased activity tolerance, Decreased balance, Decreased mobility, Decreased strength  Visit Diagnosis: Abnormal posture  Other abnormalities of gait and mobility  Unsteadiness on feet  Chronic pain of right knee  Difficulty in walking, not elsewhere classified  Muscle weakness (generalized)     Problem List Patient Active Problem List   Diagnosis Date Noted   Disease of spinal cord ([place]) 05/21/2021   Anti-RNP antibodies present 05/21/2021   Weakness of both lower extremities 02/05/2021   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

## 2021-07-23 ENCOUNTER — Other Ambulatory Visit: Payer: Self-pay

## 2021-07-23 ENCOUNTER — Ambulatory Visit: Payer: BC Managed Care – PPO | Attending: Family Medicine

## 2021-07-23 DIAGNOSIS — M6281 Muscle weakness (generalized): Secondary | ICD-10-CM | POA: Insufficient documentation

## 2021-07-23 DIAGNOSIS — G8929 Other chronic pain: Secondary | ICD-10-CM | POA: Diagnosis not present

## 2021-07-23 DIAGNOSIS — R2689 Other abnormalities of gait and mobility: Secondary | ICD-10-CM | POA: Diagnosis not present

## 2021-07-23 DIAGNOSIS — R293 Abnormal posture: Secondary | ICD-10-CM | POA: Insufficient documentation

## 2021-07-23 DIAGNOSIS — R2681 Unsteadiness on feet: Secondary | ICD-10-CM | POA: Diagnosis present

## 2021-07-23 DIAGNOSIS — R262 Difficulty in walking, not elsewhere classified: Secondary | ICD-10-CM | POA: Insufficient documentation

## 2021-07-23 DIAGNOSIS — M25561 Pain in right knee: Secondary | ICD-10-CM | POA: Insufficient documentation

## 2021-07-23 NOTE — Therapy (Signed)
[facility] [address] [place], [place], [address] Phone: [phone]   Fax:  [phone]  Physical Therapy Evaluation  Patient Details  Name: Maurice Cruz MRN: [id number] Date of Birth: 08-23-1966 Referring Provider (PT): Dr Courtney Heys   Encounter Date: 07/23/2021   PT End of Session - 07/23/21 1106     Visit Number 32    Number of Visits 40    Date for PT Re-Evaluation 07/24/21    Authorization Type [employer]BS- 60 visit limit (has used 12?)    Authorization - Visit Number 15    Authorization - Number of Visits 48    Progress Note Due on Visit 24    PT Start Time 9485    PT Stop Time 1100    PT Time Calculation (min) 45 min    Equipment Utilized During Treatment Other (comment);Gait belt    Activity Tolerance Patient tolerated treatment well;No increased pain    Behavior During Therapy WFL for tasks assessed/performed             Past Medical History:  Diagnosis Date   Anxiety    Asthma    as a child   Depression    ED (erectile dysfunction)    GERD (gastroesophageal reflux disease)    Hx of spinal cord injury 03/2020   Hypertension    Neurogenic bladder    self caths   Neurogenic bowel    has to be digitially stimulated - 04/24/21   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[place])     Past Surgical History:  Procedure Laterality Date   Anorectal biospy  02/09/2021   Colon Biospy  02/09/2021   IR ANGIO INTRA EXTRACRAN SEL COM CAROTID INNOMINATE BILAT MOD SED  04/25/2021   IR ANGIO VERTEBRAL SEL SUBCLAVIAN INNOMINATE UNI L MOD SED  04/25/2021   IR ANGIO VERTEBRAL SEL VERTEBRAL UNI R MOD SED  04/25/2021   IR ANGIO/SPINAL LEFT  04/25/2021   IR ANGIO/SPINAL LEFT  04/25/2021   IR ANGIO/SPINAL LEFT  04/25/2021   IR ANGIO/SPINAL LEFT  04/25/2021   IR ANGIO/SPINAL LEFT  04/25/2021   IR ANGIO/SPINAL LEFT  04/25/2021   IR ANGIO/SPINAL LEFT  04/25/2021   IR ANGIO/SPINAL LEFT  04/25/2021   IR  ANGIO/SPINAL LEFT  04/25/2021   IR ANGIO/SPINAL LEFT  04/25/2021   IR ANGIO/SPINAL LEFT  04/25/2021   IR ANGIO/SPINAL LEFT  04/25/2021   IR ANGIO/SPINAL LEFT  04/25/2021   IR ANGIO/SPINAL RIGHT  04/25/2021   IR ANGIO/SPINAL RIGHT  04/25/2021   IR ANGIO/SPINAL RIGHT  04/25/2021   IR ANGIO/SPINAL RIGHT  04/25/2021   IR ANGIO/SPINAL RIGHT  04/25/2021   IR ANGIO/SPINAL RIGHT  04/25/2021   IR ANGIO/SPINAL RIGHT  04/25/2021   IR ANGIO/SPINAL RIGHT  04/25/2021   IR ANGIO/SPINAL RIGHT  04/25/2021   IR ANGIO/SPINAL RIGHT  04/25/2021   IR ANGIO/SPINAL RIGHT  04/25/2021   IR ANGIO/SPINAL RIGHT  04/25/2021   IR ANGIO/SPINAL RIGHT  04/25/2021   IR ANGIO/SPINAL RIGHT  04/25/2021   IR ANGIOGRAM EXTREMITY BILATERAL  04/25/2021   IR RADIOLOGIST EVAL & MGMT  03/21/2021   RADIOLOGY WITH ANESTHESIA N/A 04/25/2021   Procedure: IR WITH ANESTHESIA SPINAL ANGIOGRAM;  Surgeon: Luanne Bras, MD;  Location: [facility];  Service: Radiology;  Laterality: N/A;    There were no vitals filed for this visit.    Subjective Assessment - 07/23/21 1017     Subjective Feels he may be  plateaued but feels therapy has helped.  Wishes to continue aquatic program and alternate between land and water    Limitations Standing;Walking    How long can you sit comfortably? 30 min    How long can you stand comfortably? 5 min    How long can you walk comfortably? 5 min    Currently in Pain? No/denies                [facility] PT Assessment - 07/23/21 0001       Strength   Right Hip Flexion 3-/5    Right Hip Extension 3/5    Right Knee Flexion 3/5    Right Knee Extension 3-/5    Right Ankle Dorsiflexion 4/5                        Objective measurements completed on examination: See above findings.       [facility] Adult PT Treatment/Exercise - 07/23/21 0001       Transfers   Transfers Sit to Stand    Sit to Stand 5: Supervision    Stand to Sit 5: Supervision    Stand Pivot Transfers 5: Supervision    Lateral/Scoot  Transfers 6: Modified independent (Device/Increase time)      Ambulation/Gait   Ambulation/Gait Yes    Ambulation/Gait Assistance 5: Supervision    Ambulation Distance (Feet) 275 Feet    Assistive device Rolling walker    Gait Pattern Step-through pattern    Ambulation Surface Level;Indoor      Lumbar Exercises: Supine   Other Supine Lumbar Exercises RLE marching x10      Knee/Hip Exercises: Supine   Heel Slides AAROM;Right;1 set;10 reps    Other Supine Knee/Hip Exercises R hip fallouts 10x    Other Supine Knee/Hip Exercises R hip IR/ER 10x                       PT Short Term Goals - 02/15/21 0956       PT SHORT TERM GOAL #1   Title Patient to demo initial HEP back to PT w/o need of cuing    Baseline initial HEP issued today; 01/22/21 HEP updated today, initial HEP demoed by patient    Time 4    Period Weeks    Status Achieved    Target Date 01/16/21      PT SHORT TERM GOAL #2   Title patine to able to demo stand pivot and STS transfers with S    Baseline able to perform stand pivot and STS transfers with CGA; 01/22/21 Able to perform STS transfer with UE support and only S    Time 4    Period Weeks    Status Achieved    Target Date 01/16/21      PT SHORT TERM GOAL #3   Title patient able to ambulate 355ft aross level surfaces using RW and light CGA    Baseline 122ft with RW across level surfaces with CGA and WC follow; 01/22/21 Able to ambulate 264ft with RW and SBA/CGA; 02/15/21 Ambulation of 37ft across level ground with light CGA using RW w/o simulated AFO    Time 4    Period Weeks    Status Achieved    Target Date 01/16/21      PT SHORT TERM GOAL #4   Title patient to demo I in bed mobility with focus on control of RLE    Baseline requires CGA to  transition from sit/supine needing to scoop RLE onto bed using LLE; 01/22/21 PAtient I in bed mobility accomodating for RLE weakness appropriately    Time 4    Period Weeks    Status Achieved    Target Date  01/16/21      PT SHORT TERM GOAL #5   Title Assess BERG and set appropriate goal    Baseline Berg performed and LTG written    Time 4    Period Weeks    Status Achieved    Target Date 01/16/21               PT Long Term Goals - 07/23/21 1030       PT LONG TERM GOAL #1   Title Pt will increase Berg score from 25/56 to >30/56 for improved balance and decreased fall risk. (All LTGs due 02/13/21)    Baseline 12/29/20 25/56; 02/15/21 BERG score 35    Period Weeks    Status Achieved      PT LONG TERM GOAL #2   Title Ambulate 573ft with LRAD across level and unlevel ground with S    Baseline 05/24/21 Ambulation of 559ft in outdoors with S and rollator; ambulation of 27ft in clinic with RW and S    Time 12    Period Weeks    Status On-going      PT LONG TERM GOAL #3   Title Incerase R hip strength to 3/5 to allow effective swing through in gait cycle    Baseline 2 to 3/5 RLE sterngth; 02/15/21 3/5 sterngth in R DF, PF and knee extension, 3-/5 knee flexion, 1/5 hip ext., 3-/5; 05/24/21 3-/5 R hip flexion during supine march, 3/5 knee ext, 3-/5 knee flexion, 3/5 DF, 3/5 PF 07/23/21 R DF 4/5, knee flexion 3/5, knee extension 3-/5, 3-/5 hip fleixon in supine march    Time 12    Period Weeks    Status On-going      PT LONG TERM GOAL #4   Title Patient to demo I in all transfers    Baseline Continues to require S for all transfers; 07/23/21 patient is I in sit/stand and stand pivot transfers transfers, have not attempted floor transfers    Time 12    Period Weeks    Status On-going      PT LONG TERM GOAL #6   Title Patient to perform 4" step up on RLE using BUE support 1 or more times    Baseline Able to place R foot on 2" block wth BUE support 1x; 10/24 unable to place R foot onto 4" step    Time 12    Period Weeks    Status On-going                    Plan - 07/23/21 1108     Clinical Impression Statement Patient seen for re-assessment of progress towards goals. He  is disappointed by lack of progress within his time frame but has made gains in therapy.  He is able to ambulate 276ft indoors with a RW under S.  He is now I in STS and stand pivot transfers.  he has shown mild gains in strength in his RLE and is now able to perfrom a heel slide.  He can step up to a 2" step but struggles with a 4" height.  All STGs have been met.  HEP updated to reflect progress adding hip IR/ER and heel slides.  He has been reluctant  in performing his HEP due to lack of perceived progress.  Due to progress made recommend continued OPPT services with both land and water based environments.    Personal Factors and Comorbidities Comorbidity 1    Comorbidities disease process, transverse myelitis    Examination-Activity Limitations Bed Mobility;Locomotion Level;Transfers;Continence;Toileting    Examination-Participation Restrictions Driving    Stability/Clinical Decision Making Stable/Uncomplicated    Rehab Potential Good    PT Frequency 1x / week    PT Duration 8 weeks    PT Treatment/Interventions ADLs/Self Care Home Management;Aquatic Therapy;Electrical Stimulation;DME Instruction;Gait training;Stair training;Functional mobility training;Therapeutic activities;Therapeutic exercise;Balance training;Neuromuscular re-education;Manual techniques;Wheelchair mobility training;Orthotic Fit/Training;Patient/family education    PT Next Visit Plan core strength/further proprioceptive challenges, continued RLE sterngth  and NMR    PT Home Exercise Plan [email or web address]             Patient will benefit from skilled therapeutic intervention in order to improve the following deficits and impairments:  Abnormal gait, Decreased range of motion, Difficulty walking, Decreased endurance, Decreased activity tolerance, Decreased balance, Decreased mobility, Decreased strength  Visit Diagnosis: Other abnormalities of gait and mobility  Unsteadiness on feet  Muscle weakness (generalized)  Difficulty  in walking, not elsewhere classified     Problem List Patient Active Problem List   Diagnosis Date Noted   Disease of spinal cord ([place]) 05/21/2021   Anti-RNP antibodies present 05/21/2021   Weakness of both lower extremities 02/05/2021   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

## 2021-07-23 NOTE — Patient Instructions (Signed)
Access Code: [email or web address] URL: [email or web address] Date: 07/23/2021 Prepared by: Sharlynn Oliphant  Exercises Supine Single Bent Knee Fallout - 2 x daily - 7 x weekly - 2 sets - 10 reps Supine Bridge with Mini Swiss Ball Between Knees - 2 x daily - 7 x weekly - 2 sets - 10 reps Supine March - 2 x daily - 7 x weekly - 2 sets - 10 reps Supine Heel Slide - 2 x daily - 7 x weekly - 2 sets - 10 reps Supine Hip Internal and External Rotation - 2 x daily - 7 x weekly - 3 sets - 10 reps - 30s hold

## 2021-07-25 ENCOUNTER — Ambulatory Visit (HOSPITAL_BASED_OUTPATIENT_CLINIC_OR_DEPARTMENT_OTHER): Payer: BC Managed Care – PPO | Admitting: Physical Therapy

## 2021-07-25 ENCOUNTER — Encounter (HOSPITAL_BASED_OUTPATIENT_CLINIC_OR_DEPARTMENT_OTHER): Payer: Self-pay | Admitting: Physical Therapy

## 2021-07-25 ENCOUNTER — Other Ambulatory Visit: Payer: Self-pay

## 2021-07-25 DIAGNOSIS — R2689 Other abnormalities of gait and mobility: Secondary | ICD-10-CM

## 2021-07-25 DIAGNOSIS — R293 Abnormal posture: Secondary | ICD-10-CM

## 2021-07-25 DIAGNOSIS — M6281 Muscle weakness (generalized): Secondary | ICD-10-CM

## 2021-07-25 DIAGNOSIS — R2681 Unsteadiness on feet: Secondary | ICD-10-CM

## 2021-07-25 DIAGNOSIS — M25561 Pain in right knee: Secondary | ICD-10-CM

## 2021-07-25 DIAGNOSIS — R262 Difficulty in walking, not elsewhere classified: Secondary | ICD-10-CM

## 2021-07-25 NOTE — Therapy (Signed)
[facility] [address] [place], [place], [address] Phone: [phone]   Fax:  [phone]  Physical Therapy Treatment  Patient Details  Name: Maurice Cruz MRN: [id number] Date of Birth: 1966/03/12 Referring Provider (PT): Dr Courtney Heys   Encounter Date: 07/25/2021   PT End of Session - 07/25/21 1529     Visit Number 33    Number of Visits 40    Date for PT Re-Evaluation 07/24/21    Authorization Type BCBS- 60 visit limit (has used 12?)    PT Start Time 1415    PT Stop Time 1500    PT Time Calculation (min) 45 min    Equipment Utilized During Treatment Other (comment);Gait belt    Activity Tolerance Patient tolerated treatment well;No increased pain    Behavior During Therapy WFL for tasks assessed/performed             Past Medical History:  Diagnosis Date   Anxiety    Asthma    as a child   Depression    ED (erectile dysfunction)    GERD (gastroesophageal reflux disease)    Hx of spinal cord injury 03/2020   Hypertension    Neurogenic bladder    self caths   Neurogenic bowel    has to be digitially stimulated - 04/24/21   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[place])     Past Surgical History:  Procedure Laterality Date   Anorectal biospy  02/09/2021   Colon Biospy  02/09/2021   IR ANGIO INTRA EXTRACRAN SEL COM CAROTID INNOMINATE BILAT MOD SED  04/25/2021   IR ANGIO VERTEBRAL SEL SUBCLAVIAN INNOMINATE UNI L MOD SED  04/25/2021   IR ANGIO VERTEBRAL SEL VERTEBRAL UNI R MOD SED  04/25/2021   IR ANGIO/SPINAL LEFT  04/25/2021   IR ANGIO/SPINAL LEFT  04/25/2021   IR ANGIO/SPINAL LEFT  04/25/2021   IR ANGIO/SPINAL LEFT  04/25/2021   IR ANGIO/SPINAL LEFT  04/25/2021   IR ANGIO/SPINAL LEFT  04/25/2021   IR ANGIO/SPINAL LEFT  04/25/2021   IR ANGIO/SPINAL LEFT  04/25/2021   IR ANGIO/SPINAL LEFT  04/25/2021   IR ANGIO/SPINAL LEFT  04/25/2021   IR ANGIO/SPINAL LEFT  04/25/2021   IR ANGIO/SPINAL  LEFT  04/25/2021   IR ANGIO/SPINAL LEFT  04/25/2021   IR ANGIO/SPINAL RIGHT  04/25/2021   IR ANGIO/SPINAL RIGHT  04/25/2021   IR ANGIO/SPINAL RIGHT  04/25/2021   IR ANGIO/SPINAL RIGHT  04/25/2021   IR ANGIO/SPINAL RIGHT  04/25/2021   IR ANGIO/SPINAL RIGHT  04/25/2021   IR ANGIO/SPINAL RIGHT  04/25/2021   IR ANGIO/SPINAL RIGHT  04/25/2021   IR ANGIO/SPINAL RIGHT  04/25/2021   IR ANGIO/SPINAL RIGHT  04/25/2021   IR ANGIO/SPINAL RIGHT  04/25/2021   IR ANGIO/SPINAL RIGHT  04/25/2021   IR ANGIO/SPINAL RIGHT  04/25/2021   IR ANGIO/SPINAL RIGHT  04/25/2021   IR ANGIOGRAM EXTREMITY BILATERAL  04/25/2021   IR RADIOLOGIST EVAL & MGMT  03/21/2021   RADIOLOGY WITH ANESTHESIA N/A 04/25/2021   Procedure: IR WITH ANESTHESIA SPINAL ANGIOGRAM;  Surgeon: Luanne Bras, MD;  Location: [facility];  Service: Radiology;  Laterality: N/A;    There were no vitals filed for this visit.   Subjective Assessment - 07/25/21 1530     Subjective "Feels like I'm in prison"  PT Short Term Goals - 02/15/21 0956       PT SHORT TERM GOAL #1   Title Patient to demo initial HEP back to PT w/o need of cuing    Baseline initial HEP issued today; 01/22/21 HEP updated today, initial HEP demoed by patient    Time 4    Period Weeks    Status Achieved    Target Date 01/16/21      PT SHORT TERM GOAL #2   Title patine to able to demo stand pivot and STS transfers with S    Baseline able to perform stand pivot and STS transfers with CGA; 01/22/21 Able to perform STS transfer with UE support and only S    Time 4    Period Weeks    Status Achieved    Target Date 01/16/21      PT SHORT TERM GOAL #3   Title patient able to ambulate 371ft aross level surfaces using RW and light CGA    Baseline 178ft with RW across level surfaces with CGA and WC follow; 01/22/21 Able to ambulate 246ft with RW and SBA/CGA; 02/15/21 Ambulation of 35ft across level ground with light CGA  using RW w/o simulated AFO    Time 4    Period Weeks    Status Achieved    Target Date 01/16/21      PT SHORT TERM GOAL #4   Title patient to demo I in bed mobility with focus on control of RLE    Baseline requires CGA to transition from sit/supine needing to scoop RLE onto bed using LLE; 01/22/21 PAtient I in bed mobility accomodating for RLE weakness appropriately    Time 4    Period Weeks    Status Achieved    Target Date 01/16/21      PT SHORT TERM GOAL #5   Title Assess BERG and set appropriate goal    Baseline Berg performed and LTG written    Time 4    Period Weeks    Status Achieved    Target Date 01/16/21               PT Long Term Goals - 07/23/21 1030       PT LONG TERM GOAL #1   Title Pt will increase Berg score from 25/56 to >30/56 for improved balance and decreased fall risk. (All LTGs due 02/13/21)    Baseline 12/29/20 25/56; 02/15/21 BERG score 35    Period Weeks    Status Achieved      PT LONG TERM GOAL #2   Title Ambulate 565ft with LRAD across level and unlevel ground with S    Baseline 05/24/21 Ambulation of 565ft in outdoors with S and rollator; ambulation of 242ft in clinic with RW and S    Time 12    Period Weeks    Status On-going    Target Date 09/24/21      PT LONG TERM GOAL #3   Title Incerase R hip strength to 3/5 to allow effective swing through in gait cycle    Baseline 2 to 3/5 RLE sterngth; 02/15/21 3/5 sterngth in R DF, PF and knee extension, 3-/5 knee flexion, 1/5 hip ext., 3-/5; 05/24/21 3-/5 R hip flexion during supine march, 3/5 knee ext, 3-/5 knee flexion, 3/5 DF, 3/5 PF 07/23/21 R DF 4/5, knee flexion 3/5, knee extension 3-/5, 3-/5 hip fleixon in supine march    Time 12    Period Weeks    Status On-going  Target Date 09/24/21      PT LONG TERM GOAL #4   Title Patient to demo I in all transfers    Baseline Continues to require S for all transfers; 07/23/21 patient is I in sit/stand and stand pivot transfers transfers, have not  attempted floor transfers    Time 12    Period Weeks    Status On-going    Target Date 09/24/21      PT LONG TERM GOAL #5   Title patient will ambulate room to room distances with LRAd under S    Baseline 160ft with RW and CGA    Time 8    Period Weeks    Status Achieved      PT LONG TERM GOAL #6   Title Patient to perform 4" step up on RLE using BUE support 1 or more times    Baseline Able to place R foot on 2" block wth BUE support 1x; 10/24 unable to place R foot onto 4" step    Time 12    Period Weeks    Status On-going    Target Date 09/24/21            Pt seen for aquatic therapy today.  Treatment took place in water 3.25-4.8 ft in depth at the [facility] pool. Temp of water was 91.  Pt entered/exited pool using stairs, alternating pattern with min assist   Warm up: water walking supported by hand buoys x 6 widths forward and backward.   Prioprioceptive challenges -backward lunge 2 x 10 r/L   -step overs (water step) x10  -Side stepping R/L x 10.  VC for TKE VMO focus    VMO strengthening: squats x 10  1/3 to floor  SL x 10 R/L submerged to chest   Core strengthening using 2.5 foam hand buoys  -knee to chest in vertical  L/R x 10, then alternating x 10. -supine suspension knees to chest 2x10 therapsit supporting   Backward walking with foam ankle buoy lle 2 widths between most exercises for recovery with focus on glute and hamstring strenthening     Pt requires buoyancy for support and to offload joints with strengthening exercises. Viscosity of the water is needed for resistance of strengthening; water current perturbations provides challenge to standing balance unsupported, requiring increased core activation.         Plan - 07/25/21 1509     Clinical Impression Statement Pt with improved VMO strength bilat. Submerged to chest pt indep with full knee extension concentric control 6 inch step  RLE with good eccentric control 8/10, left improved  4-5/10 reps (submerged). Improving balance/core strength as evidenced by backward amb submerged to waist without LOB.    Stability/Clinical Decision Making Stable/Uncomplicated    Clinical Decision Making Low    Rehab Potential Good    PT Frequency 1x / week    PT Duration 8 weeks    PT Treatment/Interventions ADLs/Self Care Home Management;Aquatic Therapy;Electrical Stimulation;DME Instruction;Gait training;Stair training;Functional mobility training;Therapeutic activities;Therapeutic exercise;Balance training;Neuromuscular re-education;Manual techniques;Wheelchair mobility training;Orthotic Fit/Training;Patient/family education    PT Home Exercise Plan [id number]           Patient will benefit from skilled therapeutic intervention in order to improve the following deficits and impairments:  Abnormal gait, Decreased range of motion, Difficulty walking, Decreased endurance, Decreased activity tolerance, Decreased balance, Decreased mobility, Decreased strength  Visit Diagnosis: Abnormal posture  Other abnormalities of gait and mobility  Unsteadiness on feet  Chronic pain of right  knee  Difficulty in walking, not elsewhere classified  Muscle weakness (generalized)     Problem List Patient Active Problem List   Diagnosis Date Noted   Disease of spinal cord ([place]) 05/21/2021   Anti-RNP antibodies present 05/21/2021   Weakness of both lower extremities 02/05/2021   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 [place], [place], [address] Phone: [phone]   Fax:  [phone]  Name: Maurice Cruz MRN: [id number] Date of  Birth: 11-27-1965

## 2021-08-01 ENCOUNTER — Ambulatory Visit (HOSPITAL_BASED_OUTPATIENT_CLINIC_OR_DEPARTMENT_OTHER): Payer: Self-pay | Admitting: Physical Therapy

## 2021-08-03 ENCOUNTER — Ambulatory Visit: Payer: BC Managed Care – PPO | Attending: Family Medicine

## 2021-08-03 ENCOUNTER — Other Ambulatory Visit: Payer: Self-pay

## 2021-08-03 DIAGNOSIS — R293 Abnormal posture: Secondary | ICD-10-CM | POA: Diagnosis present

## 2021-08-03 DIAGNOSIS — R262 Difficulty in walking, not elsewhere classified: Secondary | ICD-10-CM | POA: Diagnosis present

## 2021-08-03 DIAGNOSIS — R2681 Unsteadiness on feet: Secondary | ICD-10-CM | POA: Insufficient documentation

## 2021-08-03 DIAGNOSIS — M6281 Muscle weakness (generalized): Secondary | ICD-10-CM | POA: Insufficient documentation

## 2021-08-03 DIAGNOSIS — R2689 Other abnormalities of gait and mobility: Secondary | ICD-10-CM | POA: Insufficient documentation

## 2021-08-03 DIAGNOSIS — G373 Acute transverse myelitis in demyelinating disease of central nervous system: Secondary | ICD-10-CM | POA: Diagnosis present

## 2021-08-03 NOTE — Therapy (Signed)
[facility] [address] [place], [place], [address] Phone: [phone]   Fax:  [phone]  Physical Therapy Treatment  Patient Details  Name: Maurice Cruz MRN: [id number] Date of Birth: Jul 20, 1966 Referring Provider (PT): Dr Courtney Heys   Encounter Date: 08/03/2021   PT End of Session - 08/03/21 1408     Visit Number 34    Number of Visits 40    Date for PT Re-Evaluation 07/24/21    Authorization Type [employer]BS- 60 visit limit (has used 12?)    Progress Note Due on Visit 40    PT Start Time 1400    PT Stop Time 1445    PT Time Calculation (min) 45 min    Equipment Utilized During Treatment Other (comment);Gait belt    Activity Tolerance Patient tolerated treatment well;No increased pain    Behavior During Therapy WFL for tasks assessed/performed             Past Medical History:  Diagnosis Date   Anxiety    Asthma    as a child   Depression    ED (erectile dysfunction)    GERD (gastroesophageal reflux disease)    Hx of spinal cord injury 03/2020   Hypertension    Neurogenic bladder    self caths   Neurogenic bowel    has to be digitially stimulated - 04/24/21   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[place])     Past Surgical History:  Procedure Laterality Date   Anorectal biospy  02/09/2021   Colon Biospy  02/09/2021   IR ANGIO INTRA EXTRACRAN SEL COM CAROTID INNOMINATE BILAT MOD SED  04/25/2021   IR ANGIO VERTEBRAL SEL SUBCLAVIAN INNOMINATE UNI L MOD SED  04/25/2021   IR ANGIO VERTEBRAL SEL VERTEBRAL UNI R MOD SED  04/25/2021   IR ANGIO/SPINAL LEFT  04/25/2021   IR ANGIO/SPINAL LEFT  04/25/2021   IR ANGIO/SPINAL LEFT  04/25/2021   IR ANGIO/SPINAL LEFT  04/25/2021   IR ANGIO/SPINAL LEFT  04/25/2021   IR ANGIO/SPINAL LEFT  04/25/2021   IR ANGIO/SPINAL LEFT  04/25/2021   IR ANGIO/SPINAL LEFT  04/25/2021   IR ANGIO/SPINAL LEFT  04/25/2021   IR ANGIO/SPINAL LEFT  04/25/2021   IR  ANGIO/SPINAL LEFT  04/25/2021   IR ANGIO/SPINAL LEFT  04/25/2021   IR ANGIO/SPINAL LEFT  04/25/2021   IR ANGIO/SPINAL RIGHT  04/25/2021   IR ANGIO/SPINAL RIGHT  04/25/2021   IR ANGIO/SPINAL RIGHT  04/25/2021   IR ANGIO/SPINAL RIGHT  04/25/2021   IR ANGIO/SPINAL RIGHT  04/25/2021   IR ANGIO/SPINAL RIGHT  04/25/2021   IR ANGIO/SPINAL RIGHT  04/25/2021   IR ANGIO/SPINAL RIGHT  04/25/2021   IR ANGIO/SPINAL RIGHT  04/25/2021   IR ANGIO/SPINAL RIGHT  04/25/2021   IR ANGIO/SPINAL RIGHT  04/25/2021   IR ANGIO/SPINAL RIGHT  04/25/2021   IR ANGIO/SPINAL RIGHT  04/25/2021   IR ANGIO/SPINAL RIGHT  04/25/2021   IR ANGIOGRAM EXTREMITY BILATERAL  04/25/2021   IR RADIOLOGIST EVAL & MGMT  03/21/2021   RADIOLOGY WITH ANESTHESIA N/A 04/25/2021   Procedure: IR WITH ANESTHESIA SPINAL ANGIOGRAM;  Surgeon: Luanne Bras, MD;  Location: [facility];  Service: Radiology;  Laterality: N/A;    There were no vitals filed for this visit.   Subjective Assessment - 08/03/21 1557     Subjective No changes tonote, no progress or gains expressed, flat affect displayed today    Patient is accompained by: Family member  Pertinent History Began to notice symptoms in 11/21, had 2 week hospital stay followed by 1 mo. at [facility] in [place].  Was receiving O[facility]T at [facility] for LE strengthening however referring MD recommended this clinic, has since moved in with parents for physical as well as financial assistance and has obtained and installed a chair lift, denies LE pain but has a hx of low back pain worse with prolonged sitting    Limitations Standing;Walking    How long can you sit comfortably? 30 min    How long can you stand comfortably? 5 min    How long can you walk comfortably? 5 min                [facility] PT Assessment - 08/03/21 0001       Strength   Right Hip Flexion 2/5    Right Hip Extension 2/5    Right Hip ABduction 2/5    Right Knee Flexion 2/5    Right Knee Extension 3-/5    Right Ankle Dorsiflexion  3+/5                           [facility] Adult PT Treatment/Exercise - 08/03/21 0001       Transfers   Transfers Sit to Stand    Sit to Stand 6: Modified independent (Device/Increase time)    Stand to Sit 6: Modified independent (Device/Increase time)    Stand Pivot Transfers 6: Modified independent (Device/Increase time)    Lateral/Scoot Transfers 6: Modified independent (Device/Increase time)      Ambulation/Gait   Ambulation/Gait Yes    Ambulation/Gait Assistance 5: Supervision;4: Min guard    Ambulation Distance (Feet) 500 Feet    Assistive device Rolling walker    Gait Pattern Step-through pattern    Ambulation Surface Level;Indoor                       PT Short Term Goals - 02/15/21 0956       PT SHORT TERM GOAL #1   Title Patient to demo initial HEP back to PT w/o need of cuing    Baseline initial HEP issued today; 01/22/21 HEP updated today, initial HEP demoed by patient    Time 4    Period Weeks    Status Achieved    Target Date 01/16/21      PT SHORT TERM GOAL #2   Title patine to able to demo stand pivot and STS transfers with S    Baseline able to perform stand pivot and STS transfers with CGA; 01/22/21 Able to perform STS transfer with UE support and only S    Time 4    Period Weeks    Status Achieved    Target Date 01/16/21      PT SHORT TERM GOAL #3   Title patient able to ambulate 377f aross level surfaces using RW and light CGA    Baseline 1137fwith RW across level surfaces with CGA and WC follow; 01/22/21 Able to ambulate 23037fith RW and SBA/CGA; 02/15/21 Ambulation of 345f16fross level ground with light CGA using RW w/o simulated AFO    Time 4    Period Weeks    Status Achieved    Target Date 01/16/21      PT SHORT TERM GOAL #4   Title patient to demo I in bed mobility with focus on control of RLE    Baseline requires CGA  to transition from sit/supine needing to scoop RLE onto bed using LLE; 01/22/21 PAtient I in bed  mobility accomodating for RLE weakness appropriately    Time 4    Period Weeks    Status Achieved    Target Date 01/16/21      PT SHORT TERM GOAL #5   Title Assess BERG and set appropriate goal    Baseline Berg performed and LTG written    Time 4    Period Weeks    Status Achieved    Target Date 01/16/21               PT Long Term Goals - 08/03/21 1425       PT LONG TERM GOAL #1   Title Pt will increase Berg score from 25/56 to >30/56 for improved balance and decreased fall risk. (All LTGs due 02/13/21)    Baseline 12/29/20 25/56; 02/15/21 BERG score 35    Period Weeks    Status Achieved      PT LONG TERM GOAL #2   Title Ambulate 538f with LRAD across level and unlevel ground with S    Baseline 05/24/21 Ambulation of 5080fin outdoors with S and rollator; ambulation of 27557fn clinic with RW and S; 08/03/21 ambulation of 500f12fclinic with RW and SBA    Time 12    Period Weeks    Status Partially Met      PT LONG TERM GOAL #3   Title Incerase R hip strength to 3/5 to allow effective swing through in gait cycle    Baseline 08/03/21 See note section for individual sites    Time 12    Period Weeks    Status On-going      PT LONG TERM GOAL #4   Title Patient to demo I in all transfers    Baseline Continues to require S for all transfers; 07/23/21 patient is I in sit/stand and stand pivot transfers transfers, have not attempted floor transfers    Time 12    Period Weeks    Status On-going      PT LONG TERM GOAL #5   Title patient will ambulate room to room distances with LRAd under S    Baseline 115ft60fh RW and CGA    Time 8    Period Weeks    Status Achieved      PT LONG TERM GOAL #6   Title Patient to perform 4" step up on RLE using BUE support 1 or more times    Baseline Able to place R foot on 2" block wth BUE support 1x; 10/24 unable to place R foot onto 4" step    Time 12    Period Weeks    Status On-going                   Plan - 08/03/21  1558     Clinical Impression Statement Todays session assessed progress towards goals, patient able to ambulate 500ft 42f RW and CGA, RLE assessed w/o significanr gains but assessed following ambulation and fatigue was a factor.  Patient able to transfer with Mod I as well as turn 360d slowly but w/o UE support    Stability/Clinical Decision Making Stable/Uncomplicated    Rehab Potential Good    PT Frequency 1x / week    PT Duration 8 weeks    PT Treatment/Interventions ADLs/Self Care Home Management;Aquatic Therapy;Electrical Stimulation;DME Instruction;Gait training;Stair training;Functional mobility training;Therapeutic activities;Therapeutic exercise;Balance training;Neuromuscular re-education;Manual techniques;Wheelchair mobility training;Orthotic  Fit/Training;Patient/family education    PT Next Visit Plan core strength/further proprioceptive challenges, continued RLE sterngth  and NMR, CKC tasks, floor transfers    PT Home Exercise Plan [email or web address]             Patient will benefit from skilled therapeutic intervention in order to improve the following deficits and impairments:  Abnormal gait, Decreased range of motion, Difficulty walking, Decreased endurance, Decreased activity tolerance, Decreased balance, Decreased mobility, Decreased strength  Visit Diagnosis: Other abnormalities of gait and mobility  Unsteadiness on feet  Difficulty in walking, not elsewhere classified     Problem List Patient Active Problem List   Diagnosis Date Noted   Disease of spinal cord ([place]) 05/21/2021   Anti-RNP antibodies present 05/21/2021   Weakness of both lower extremities 02/05/2021   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

## 2021-08-07 ENCOUNTER — Ambulatory Visit (HOSPITAL_BASED_OUTPATIENT_CLINIC_OR_DEPARTMENT_OTHER): Payer: BC Managed Care – PPO | Attending: Physical Medicine and Rehabilitation | Admitting: Physical Therapy

## 2021-08-07 ENCOUNTER — Other Ambulatory Visit: Payer: Self-pay

## 2021-08-07 ENCOUNTER — Encounter (HOSPITAL_BASED_OUTPATIENT_CLINIC_OR_DEPARTMENT_OTHER): Payer: Self-pay | Admitting: Physical Therapy

## 2021-08-07 DIAGNOSIS — G8929 Other chronic pain: Secondary | ICD-10-CM | POA: Insufficient documentation

## 2021-08-07 DIAGNOSIS — R2681 Unsteadiness on feet: Secondary | ICD-10-CM | POA: Insufficient documentation

## 2021-08-07 DIAGNOSIS — M6281 Muscle weakness (generalized): Secondary | ICD-10-CM | POA: Insufficient documentation

## 2021-08-07 DIAGNOSIS — R2689 Other abnormalities of gait and mobility: Secondary | ICD-10-CM | POA: Insufficient documentation

## 2021-08-07 DIAGNOSIS — R262 Difficulty in walking, not elsewhere classified: Secondary | ICD-10-CM | POA: Diagnosis present

## 2021-08-07 DIAGNOSIS — R293 Abnormal posture: Secondary | ICD-10-CM | POA: Diagnosis present

## 2021-08-07 DIAGNOSIS — M25561 Pain in right knee: Secondary | ICD-10-CM | POA: Insufficient documentation

## 2021-08-07 NOTE — Therapy (Signed)
[facility] [address] [place], [place], [address] Phone: [phone]   Fax:  [phone]  Physical Therapy Treatment  Patient Details  Name: Maurice Cruz MRN: [id number] Date of Birth: Mar 16, 1966 Referring Provider (PT): Dr Genice Rouge   Encounter Date: 08/07/2021   PT End of Session - 08/07/21 1408     Visit Number 35    Number of Visits 40    Date for PT Re-Evaluation 07/24/21    Authorization Type BC[employer]- 60 visit limit (has used 12?)    Progress Note Due on Visit 40    PT Start Time 1400    PT Stop Time 1445    PT Time Calculation (min) 45 min    Equipment Utilized During Treatment Other (comment);Gait belt    Activity Tolerance Patient tolerated treatment well;No increased pain    Behavior During Therapy WFL for tasks assessed/performed             Past Medical History:  Diagnosis Date   Anxiety    Asthma    as a child   Depression    ED (erectile dysfunction)    GERD (gastroesophageal reflux disease)    Hx of spinal cord injury 03/2020   Hypertension    Neurogenic bladder    self caths   Neurogenic bowel    has to be digitially stimulated - 04/24/21   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HCC)    bilateral legs   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HCC)     Past Surgical History:  Procedure Laterality Date   Anorectal biospy  02/09/2021   Colon Biospy  02/09/2021   IR ANGIO INTRA EXTRACRAN SEL COM CAROTID INNOMINATE BILAT MOD SED  04/25/2021   IR ANGIO VERTEBRAL SEL SUBCLAVIAN INNOMINATE UNI L MOD SED  04/25/2021   IR ANGIO VERTEBRAL SEL VERTEBRAL UNI R MOD SED  04/25/2021   IR ANGIO/SPINAL LEFT  04/25/2021   IR ANGIO/SPINAL LEFT  04/25/2021   IR ANGIO/SPINAL LEFT  04/25/2021   IR ANGIO/SPINAL LEFT  04/25/2021   IR ANGIO/SPINAL LEFT  04/25/2021   IR ANGIO/SPINAL LEFT  04/25/2021   IR ANGIO/SPINAL LEFT  04/25/2021   IR ANGIO/SPINAL LEFT  04/25/2021   IR ANGIO/SPINAL LEFT  04/25/2021   IR ANGIO/SPINAL LEFT  04/25/2021   IR ANGIO/SPINAL LEFT   04/25/2021   IR ANGIO/SPINAL LEFT  04/25/2021   IR ANGIO/SPINAL LEFT  04/25/2021   IR ANGIO/SPINAL RIGHT  04/25/2021   IR ANGIO/SPINAL RIGHT  04/25/2021   IR ANGIO/SPINAL RIGHT  04/25/2021   IR ANGIO/SPINAL RIGHT  04/25/2021   IR ANGIO/SPINAL RIGHT  04/25/2021   IR ANGIO/SPINAL RIGHT  04/25/2021   IR ANGIO/SPINAL RIGHT  04/25/2021   IR ANGIO/SPINAL RIGHT  04/25/2021   IR ANGIO/SPINAL RIGHT  04/25/2021   IR ANGIO/SPINAL RIGHT  04/25/2021   IR ANGIO/SPINAL RIGHT  04/25/2021   IR ANGIO/SPINAL RIGHT  04/25/2021   IR ANGIO/SPINAL RIGHT  04/25/2021   IR ANGIO/SPINAL RIGHT  04/25/2021   IR ANGIOGRAM EXTREMITY BILATERAL  04/25/2021   IR RADIOLOGIST EVAL & MGMT  03/21/2021   RADIOLOGY WITH ANESTHESIA N/A 04/25/2021   Procedure: IR WITH ANESTHESIA SPINAL ANGIOGRAM;  Surgeon: Julieanne Cotton, MD;  Location: [facility];  Service: Radiology;  Laterality: N/A;    There were no vitals filed for this visit.   Subjective Assessment - 08/07/21 1614     Subjective Had driving assessment. He will need adaptive equipment in car. Pt reporting increase in rle hip pain, jabbing. Maybe ms spasm.  PT Short Term Goals - 02/15/21 0956       PT SHORT TERM GOAL #1   Title Patient to demo initial HEP back to PT w/o need of cuing    Baseline initial HEP issued today; 01/22/21 HEP updated today, initial HEP demoed by patient    Time 4    Period Weeks    Status Achieved    Target Date 01/16/21      PT SHORT TERM GOAL #2   Title patine to able to demo stand pivot and STS transfers with S    Baseline able to perform stand pivot and STS transfers with CGA; 01/22/21 Able to perform STS transfer with UE support and only S    Time 4    Period Weeks    Status Achieved    Target Date 01/16/21      PT SHORT TERM GOAL #3   Title patient able to ambulate 336ft aross level surfaces using RW and light CGA    Baseline 157ft with RW across level surfaces with CGA  and WC follow; 01/22/21 Able to ambulate 259ft with RW and SBA/CGA; 02/15/21 Ambulation of 313ft across level ground with light CGA using RW w/o simulated AFO    Time 4    Period Weeks    Status Achieved    Target Date 01/16/21      PT SHORT TERM GOAL #4   Title patient to demo I in bed mobility with focus on control of RLE    Baseline requires CGA to transition from sit/supine needing to scoop RLE onto bed using LLE; 01/22/21 PAtient I in bed mobility accomodating for RLE weakness appropriately    Time 4    Period Weeks    Status Achieved    Target Date 01/16/21      PT SHORT TERM GOAL #5   Title Assess BERG and set appropriate goal    Baseline Berg performed and LTG written    Time 4    Period Weeks    Status Achieved    Target Date 01/16/21               PT Long Term Goals - 08/03/21 1425       PT LONG TERM GOAL #1   Title Pt will increase Berg score from 25/56 to >30/56 for improved balance and decreased fall risk. (All LTGs due 02/13/21)    Baseline 12/29/20 25/56; 02/15/21 BERG score 35    Period Weeks    Status Achieved      PT LONG TERM GOAL #2   Title Ambulate 573ft with LRAD across level and unlevel ground with S    Baseline 05/24/21 Ambulation of 574ft in outdoors with S and rollator; ambulation of 261ft in clinic with RW and S; 08/03/21 ambulation of 513ft inclinic with RW and SBA    Time 12    Period Weeks    Status Partially Met      PT LONG TERM GOAL #3   Title Incerase R hip strength to 3/5 to allow effective swing through in gait cycle    Baseline 08/03/21 See note section for individual sites    Time 12    Period Weeks    Status On-going      PT LONG TERM GOAL #4   Title Patient to demo I in all transfers    Baseline Continues to require S for all transfers; 07/23/21 patient is I in sit/stand and stand pivot transfers transfers, have not attempted  floor transfers    Time 12    Period Weeks    Status On-going      PT LONG TERM GOAL #5   Title patient  will ambulate room to room distances with LRAd under S    Baseline 147ft with RW and CGA    Time 8    Period Weeks    Status Achieved      PT LONG TERM GOAL #6   Title Patient to perform 4" step up on RLE using BUE support 1 or more times    Baseline Able to place R foot on 2" block wth BUE support 1x; 10/24 unable to place R foot onto 4" step    Time 12    Period Weeks    Status On-going            Pt seen for aquatic therapy today.  Treatment took place in water 3.25-4.8 ft in depth at the [facility] pool. Temp of water was 91.  Pt entered/exited pool using stairs, alternating pattern with min assist   Warm up: water walking supported by hand buoys x 6 widths forward and backward.  STS from 3rd water step pushing up from step x 10 -from 2nd water step needing ue assistance to rise x 10. Cues for TKE   Step ups vc for eccentric and concentric bilateral control Forward x 10 R/L   Prone suspension ue supported on water step.  Completed for abdominal and hip flex stretch. Strengthening;hip flex then ext with tightening of glut x5 R/L   Manual L/R Hip flex stretching pt supported by wall   Pt requires buoyancy for support and to offload joints with strengthening exercises. Viscosity of the water is needed for resistance of strengthening; water current perturbations provides challenge to standing balance unsupported, requiring increased core activation.       Plan - 08/07/21 1614     Clinical Impression Statement Pt fatigued quickly today. Did focus on STS which incorporated squats and VMO strengthening submerged at varius level. Bilat hip flex tightness. Manual stretching bilaterally. Pt reporting right hip flexor spasm increasing daily.  He is instructed to lie prone for easy flex stretch at home 2-3 x daily.    Stability/Clinical Decision Making Stable/Uncomplicated    Clinical Decision Making Low    Rehab Potential Good    PT Frequency 1x / week    PT Duration  8 weeks    PT Treatment/Interventions ADLs/Self Care Home Management;Aquatic Therapy;Electrical Stimulation;DME Instruction;Gait training;Stair training;Functional mobility training;Therapeutic activities;Therapeutic exercise;Balance training;Neuromuscular re-education;Manual techniques;Wheelchair mobility training;Orthotic Fit/Training;Patient/family education    PT Next Visit Plan aquatic pilates    PT Home Exercise Plan [id number]             Patient will benefit from skilled therapeutic intervention in order to improve the following deficits and impairments:  Abnormal gait, Decreased range of motion, Difficulty walking, Decreased endurance, Decreased activity tolerance, Decreased balance, Decreased mobility, Decreased strength  Visit Diagnosis: Abnormal posture  Difficulty in walking, not elsewhere classified  Muscle weakness (generalized)  Chronic pain of right knee  Unsteadiness on feet  Other abnormalities of gait and mobility     Problem List Patient Active Problem List   Diagnosis Date Noted   Disease of spinal cord ([place]) 05/21/2021   Anti-RNP antibodies present 05/21/2021   Weakness of both lower extremities 02/05/2021   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[place]) 12/08/2020   Neurogenic bladder 12/08/2020   Neurogenic bowel 12/08/2020   Paraplegia following spinal cord injury ([place])  12/08/2020   Wheelchair dependence 12/08/2020   Spasticity 12/08/2020   Chronic bilateral thoracic back pain 12/08/2020    Annamarie Major) [place] Doig MPT 08/07/2021, 4:27 PM  [facility] [address] [place], [place], [address] Phone: [phone]   Fax:  [phone]  Name: Maurice Cruz MRN: [id number] Date of Birth: 1966-05-26

## 2021-08-08 ENCOUNTER — Other Ambulatory Visit: Payer: Self-pay | Admitting: Physical Medicine and Rehabilitation

## 2021-08-15 ENCOUNTER — Ambulatory Visit (HOSPITAL_BASED_OUTPATIENT_CLINIC_OR_DEPARTMENT_OTHER): Payer: Self-pay | Admitting: Physical Therapy

## 2021-08-15 ENCOUNTER — Ambulatory Visit: Payer: BC Managed Care – PPO

## 2021-08-15 ENCOUNTER — Other Ambulatory Visit: Payer: Self-pay

## 2021-08-15 DIAGNOSIS — R2681 Unsteadiness on feet: Secondary | ICD-10-CM

## 2021-08-15 DIAGNOSIS — R262 Difficulty in walking, not elsewhere classified: Secondary | ICD-10-CM

## 2021-08-15 DIAGNOSIS — G373 Acute transverse myelitis in demyelinating disease of central nervous system: Secondary | ICD-10-CM

## 2021-08-15 DIAGNOSIS — R2689 Other abnormalities of gait and mobility: Secondary | ICD-10-CM | POA: Diagnosis not present

## 2021-08-15 DIAGNOSIS — M6281 Muscle weakness (generalized): Secondary | ICD-10-CM

## 2021-08-15 NOTE — Therapy (Signed)
[facility] [address] [place], [place], [address] Phone: [phone]   Fax:  [phone]  Physical Therapy Treatment  Patient Details  Name: Maurice Cruz MRN: [id number] Date of Birth: May 29, 1966 Referring Provider (PT): Dr Courtney Heys   Encounter Date: 08/15/2021   PT End of Session - 08/15/21 1324     Visit Number 36    Number of Visits 40    Date for PT Re-Evaluation 07/24/21    Authorization Type BCBS- 60 visit limit (has used 12?)    Progress Note Due on Visit 40    Equipment Utilized During Treatment Other (comment);Gait belt    Activity Tolerance Patient tolerated treatment well;No increased pain    Behavior During Therapy WFL for tasks assessed/performed             Past Medical History:  Diagnosis Date   Anxiety    Asthma    as a child   Depression    ED (erectile dysfunction)    GERD (gastroesophageal reflux disease)    Hx of spinal cord injury 03/2020   Hypertension    Neurogenic bladder    self caths   Neurogenic bowel    has to be digitially stimulated - 04/24/21   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[place])    bilateral legs   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[place])     Past Surgical History:  Procedure Laterality Date   Anorectal biospy  02/09/2021   Colon Biospy  02/09/2021   IR ANGIO INTRA EXTRACRAN SEL COM CAROTID INNOMINATE BILAT MOD SED  04/25/2021   IR ANGIO VERTEBRAL SEL SUBCLAVIAN INNOMINATE UNI L MOD SED  04/25/2021   IR ANGIO VERTEBRAL SEL VERTEBRAL UNI R MOD SED  04/25/2021   IR ANGIO/SPINAL LEFT  04/25/2021   IR ANGIO/SPINAL LEFT  04/25/2021   IR ANGIO/SPINAL LEFT  04/25/2021   IR ANGIO/SPINAL LEFT  04/25/2021   IR ANGIO/SPINAL LEFT  04/25/2021   IR ANGIO/SPINAL LEFT  04/25/2021   IR ANGIO/SPINAL LEFT  04/25/2021   IR ANGIO/SPINAL LEFT  04/25/2021   IR ANGIO/SPINAL LEFT  04/25/2021   IR ANGIO/SPINAL LEFT  04/25/2021   IR ANGIO/SPINAL LEFT  04/25/2021   IR ANGIO/SPINAL LEFT  04/25/2021   IR ANGIO/SPINAL LEFT   04/25/2021   IR ANGIO/SPINAL RIGHT  04/25/2021   IR ANGIO/SPINAL RIGHT  04/25/2021   IR ANGIO/SPINAL RIGHT  04/25/2021   IR ANGIO/SPINAL RIGHT  04/25/2021   IR ANGIO/SPINAL RIGHT  04/25/2021   IR ANGIO/SPINAL RIGHT  04/25/2021   IR ANGIO/SPINAL RIGHT  04/25/2021   IR ANGIO/SPINAL RIGHT  04/25/2021   IR ANGIO/SPINAL RIGHT  04/25/2021   IR ANGIO/SPINAL RIGHT  04/25/2021   IR ANGIO/SPINAL RIGHT  04/25/2021   IR ANGIO/SPINAL RIGHT  04/25/2021   IR ANGIO/SPINAL RIGHT  04/25/2021   IR ANGIO/SPINAL RIGHT  04/25/2021   IR ANGIOGRAM EXTREMITY BILATERAL  04/25/2021   IR RADIOLOGIST EVAL & MGMT  03/21/2021   RADIOLOGY WITH ANESTHESIA N/A 04/25/2021   Procedure: IR WITH ANESTHESIA SPINAL ANGIOGRAM;  Surgeon: Luanne Bras, MD;  Location: [place];  Service: Radiology;  Laterality: N/A;    There were no vitals filed for this visit.   Subjective Assessment - 08/15/21 1323     Subjective No falls to report, has been placed on a fluid pill to regulate BP    Patient is accompained by: Family member    Pertinent History Began to notice symptoms in 11/21, had 2 week hospital stay followed by 1 mo. at [facility] in [place].  Was receiving OPPT at [facility] for LE strengthening however referring MD recommended this clinic, has since moved in with parents for physical as well as financial assistance and has obtained and installed a chair lift, denies LE pain but has a hx of low back pain worse with prolonged sitting    Limitations Standing;Walking    How long can you sit comfortably? 30 min    How long can you stand comfortably? 5 min    How long can you walk comfortably? 5 min                               [facility] Adult PT Treatment/Exercise - 08/15/21 0001       Transfers   Transfers Sit to Stand    Sit to Stand 6: Modified independent (Device/Increase time)    Stand to Sit 6: Modified independent (Device/Increase time)    Stand Pivot Transfers 6: Modified independent (Device/Increase time)     Lateral/Scoot Transfers 6: Modified independent (Device/Increase time)      Ambulation/Gait   Ambulation/Gait Yes    Ambulation/Gait Assistance 5: Supervision;4: Min guard    Ambulation Distance (Feet) 0.13 Feet    Assistive device Body weight support system    Gait Pattern Step-through pattern    Ambulation Surface Level;Indoor    Stairs Yes    Stairs Assistance 5: Supervision;4: Min guard    Gait Comments 0.13 miles at 0..8 MPH, CGA needed to step on/off of treadmill             Todays session focused on gait training with treadmill attempting to ambulate w/o UE support and with SPC.  Patient able to ambulate 12 min at a speed of 0.8 MPH while in body weight support.  Able to clear R foot 50% of time          PT Short Term Goals - 02/15/21 0956       PT SHORT TERM GOAL #1   Title Patient to demo initial HEP back to PT w/o need of cuing    Baseline initial HEP issued today; 01/22/21 HEP updated today, initial HEP demoed by patient    Time 4    Period Weeks    Status Achieved    Target Date 01/16/21      PT SHORT TERM GOAL #2   Title patine to able to demo stand pivot and STS transfers with S    Baseline able to perform stand pivot and STS transfers with CGA; 01/22/21 Able to perform STS transfer with UE support and only S    Time 4    Period Weeks    Status Achieved    Target Date 01/16/21      PT SHORT TERM GOAL #3   Title patient able to ambulate 345ft aross level surfaces using RW and light CGA    Baseline 129ft with RW across level surfaces with CGA and WC follow; 01/22/21 Able to ambulate 226ft with RW and SBA/CGA; 02/15/21 Ambulation of 354ft across level ground with light CGA using RW w/o simulated AFO    Time 4    Period Weeks    Status Achieved    Target Date 01/16/21      PT SHORT TERM GOAL #4   Title patient to demo I in bed mobility with focus on control of RLE    Baseline requires CGA to transition from sit/supine needing to scoop RLE  onto bed  using LLE; 01/22/21 PAtient I in bed mobility accomodating for RLE weakness appropriately    Time 4    Period Weeks    Status Achieved    Target Date 01/16/21      PT SHORT TERM GOAL #5   Title Assess BERG and set appropriate goal    Baseline Berg performed and LTG written    Time 4    Period Weeks    Status Achieved    Target Date 01/16/21               PT Long Term Goals - 08/03/21 1425       PT LONG TERM GOAL #1   Title Pt will increase Berg score from 25/56 to >30/56 for improved balance and decreased fall risk. (All LTGs due 02/13/21)    Baseline 12/29/20 25/56; 02/15/21 BERG score 35    Period Weeks    Status Achieved      PT LONG TERM GOAL #2   Title Ambulate 573ft with LRAD across level and unlevel ground with S    Baseline 05/24/21 Ambulation of 532ft in outdoors with S and rollator; ambulation of 242ft in clinic with RW and S; 08/03/21 ambulation of 562ft inclinic with RW and SBA    Time 12    Period Weeks    Status Partially Met      PT LONG TERM GOAL #3   Title Incerase R hip strength to 3/5 to allow effective swing through in gait cycle    Baseline 08/03/21 See note section for individual sites    Time 12    Period Weeks    Status On-going      PT LONG TERM GOAL #4   Title Patient to demo I in all transfers    Baseline Continues to require S for all transfers; 07/23/21 patient is I in sit/stand and stand pivot transfers transfers, have not attempted floor transfers    Time 12    Period Weeks    Status On-going      PT LONG TERM GOAL #5   Title patient will ambulate room to room distances with LRAd under S    Baseline 144ft with RW and CGA    Time 8    Period Weeks    Status Achieved      PT LONG TERM GOAL #6   Title Patient to perform 4" step up on RLE using BUE support 1 or more times    Baseline Able to place R foot on 2" block wth BUE support 1x; 10/24 unable to place R foot onto 4" step    Time 12    Period Weeks    Status On-going                    Plan - 08/15/21 1609     Clinical Impression Statement Todays session focused on gait training with treadmill attempting to ambulate w/o UE suppotr and with SPC.  Patient able to ambulate 12 min at a speed of 0.8 MPH while in body weight support.  Able to clear R foot 50% of time    Personal Factors and Comorbidities Comorbidity 1    Comorbidities disease process, transverse myelitis    Examination-Activity Limitations Bed Mobility;Locomotion Level;Transfers;Continence;Toileting    Examination-Participation Restrictions Driving    Stability/Clinical Decision Making Stable/Uncomplicated    Rehab Potential Good    PT Frequency 1x / week    PT Duration 8 weeks  PT Treatment/Interventions ADLs/Self Care Home Management;Aquatic Therapy;Electrical Stimulation;DME Instruction;Gait training;Stair training;Functional mobility training;Therapeutic activities;Therapeutic exercise;Balance training;Neuromuscular re-education;Manual techniques;Wheelchair mobility training;Orthotic Fit/Training;Patient/family education    PT Next Visit Plan ambulation on treadmill with bodyweight support    PT Home Exercise Plan [id number]             Patient will benefit from skilled therapeutic intervention in order to improve the following deficits and impairments:  Abnormal gait, Decreased range of motion, Difficulty walking, Decreased endurance, Decreased activity tolerance, Decreased balance, Decreased mobility, Decreased strength  Visit Diagnosis: Difficulty in walking, not elsewhere classified  Muscle weakness (generalized)  Unsteadiness on feet  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HCC)     Problem List Patient Active Problem List   Diagnosis Date Noted   Disease of spinal cord ([place]) 05/21/2021   Anti-RNP antibodies present 05/21/2021   Weakness of both lower extremities 02/05/2021   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[place]) 12/08/2020   Neurogenic bladder 12/08/2020   Neurogenic bowel 12/08/2020   Paraplegia  following spinal cord injury ([place]) 12/08/2020   Wheelchair dependence 12/08/2020   Spasticity 12/08/2020   Chronic bilateral thoracic back pain 12/08/2020    Lanice Shirts, PT 08/15/2021, 4:19 PM  [facility] [address] [place], [place], [address] Phone: [phone]   Fax:  [phone]  Name: IHOR MEINZER MRN: [id number] Date of Birth: Jul 22, 1966

## 2021-08-20 ENCOUNTER — Telehealth: Payer: Self-pay | Admitting: *Deleted

## 2021-08-20 NOTE — Telephone Encounter (Signed)
Gave completed/signed Attending 52 statement-progress report ([employer] [facility]) back to medical records to process for pt.

## 2021-08-21 ENCOUNTER — Telehealth: Payer: Self-pay | Admitting: *Deleted

## 2021-08-21 NOTE — Telephone Encounter (Signed)
Pt [place] form faxed on 08/21/2021

## 2021-08-22 ENCOUNTER — Ambulatory Visit (HOSPITAL_BASED_OUTPATIENT_CLINIC_OR_DEPARTMENT_OTHER): Payer: BC Managed Care – PPO | Admitting: Physical Therapy

## 2021-08-22 ENCOUNTER — Encounter (HOSPITAL_BASED_OUTPATIENT_CLINIC_OR_DEPARTMENT_OTHER): Payer: Self-pay | Admitting: Physical Therapy

## 2021-08-22 ENCOUNTER — Other Ambulatory Visit: Payer: Self-pay

## 2021-08-22 DIAGNOSIS — R2681 Unsteadiness on feet: Secondary | ICD-10-CM

## 2021-08-22 DIAGNOSIS — R293 Abnormal posture: Secondary | ICD-10-CM

## 2021-08-22 DIAGNOSIS — G8929 Other chronic pain: Secondary | ICD-10-CM

## 2021-08-22 DIAGNOSIS — R262 Difficulty in walking, not elsewhere classified: Secondary | ICD-10-CM

## 2021-08-22 DIAGNOSIS — M25561 Pain in right knee: Secondary | ICD-10-CM

## 2021-08-22 DIAGNOSIS — R2689 Other abnormalities of gait and mobility: Secondary | ICD-10-CM

## 2021-08-22 DIAGNOSIS — M6281 Muscle weakness (generalized): Secondary | ICD-10-CM

## 2021-08-22 NOTE — Therapy (Signed)
[facility] [address] [place], [place], [address] Phone: [phone]   Fax:  [phone]  Physical Therapy Treatment  Patient Details  Name: Maurice Cruz MRN: [id number] Date of Birth: 08/20/1966 Referring Provider (PT): Dr Courtney Heys   Encounter Date: 08/22/2021   PT End of Session - 08/22/21 1751     Visit Number 37    Number of Visits 40    Date for PT Re-Evaluation 07/24/21    Authorization Type BCBS- 60 visit limit (has used 12?)    Progress Note Due on Visit 40    PT Start Time 1415    PT Stop Time 1500    PT Time Calculation (min) 45 min    Equipment Utilized During Treatment Other (comment);Gait belt    Activity Tolerance Patient tolerated treatment well;No increased pain    Behavior During Therapy WFL for tasks assessed/performed             Past Medical History:  Diagnosis Date   Anxiety    Asthma    as a child   Depression    ED (erectile dysfunction)    GERD (gastroesophageal reflux disease)    Hx of spinal cord injury 03/2020   Hypertension    Neurogenic bladder    self caths   Neurogenic bowel    has to be digitially stimulated - 04/24/21   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[place])     Past Surgical History:  Procedure Laterality Date   Anorectal biospy  02/09/2021   Colon Biospy  02/09/2021   IR ANGIO INTRA EXTRACRAN SEL COM CAROTID INNOMINATE BILAT MOD SED  04/25/2021   IR ANGIO VERTEBRAL SEL SUBCLAVIAN INNOMINATE UNI L MOD SED  04/25/2021   IR ANGIO VERTEBRAL SEL VERTEBRAL UNI R MOD SED  04/25/2021   IR ANGIO/SPINAL LEFT  04/25/2021   IR ANGIO/SPINAL LEFT  04/25/2021   IR ANGIO/SPINAL LEFT  04/25/2021   IR ANGIO/SPINAL LEFT  04/25/2021   IR ANGIO/SPINAL LEFT  04/25/2021   IR ANGIO/SPINAL LEFT  04/25/2021   IR ANGIO/SPINAL LEFT  04/25/2021   IR ANGIO/SPINAL LEFT  04/25/2021   IR ANGIO/SPINAL LEFT  04/25/2021   IR ANGIO/SPINAL LEFT  04/25/2021   IR ANGIO/SPINAL  LEFT  04/25/2021   IR ANGIO/SPINAL LEFT  04/25/2021   IR ANGIO/SPINAL LEFT  04/25/2021   IR ANGIO/SPINAL RIGHT  04/25/2021   IR ANGIO/SPINAL RIGHT  04/25/2021   IR ANGIO/SPINAL RIGHT  04/25/2021   IR ANGIO/SPINAL RIGHT  04/25/2021   IR ANGIO/SPINAL RIGHT  04/25/2021   IR ANGIO/SPINAL RIGHT  04/25/2021   IR ANGIO/SPINAL RIGHT  04/25/2021   IR ANGIO/SPINAL RIGHT  04/25/2021   IR ANGIO/SPINAL RIGHT  04/25/2021   IR ANGIO/SPINAL RIGHT  04/25/2021   IR ANGIO/SPINAL RIGHT  04/25/2021   IR ANGIO/SPINAL RIGHT  04/25/2021   IR ANGIO/SPINAL RIGHT  04/25/2021   IR ANGIO/SPINAL RIGHT  04/25/2021   IR ANGIOGRAM EXTREMITY BILATERAL  04/25/2021   IR RADIOLOGIST EVAL & MGMT  03/21/2021   RADIOLOGY WITH ANESTHESIA N/A 04/25/2021   Procedure: IR WITH ANESTHESIA SPINAL ANGIOGRAM;  Surgeon: Luanne Bras, MD;  Location: [facility];  Service: Radiology;  Laterality: N/A;    There were no vitals filed for this visit.   Subjective Assessment - 08/22/21 1750     Subjective No questions or concerns. No falls    Currently in Pain? No/denies  PT Short Term Goals - 02/15/21 0956       PT SHORT TERM GOAL #1   Title Patient to demo initial HEP back to PT w/o need of cuing    Baseline initial HEP issued today; 01/22/21 HEP updated today, initial HEP demoed by patient    Time 4    Period Weeks    Status Achieved    Target Date 01/16/21      PT SHORT TERM GOAL #2   Title patine to able to demo stand pivot and STS transfers with S    Baseline able to perform stand pivot and STS transfers with CGA; 01/22/21 Able to perform STS transfer with UE support and only S    Time 4    Period Weeks    Status Achieved    Target Date 01/16/21      PT SHORT TERM GOAL #3   Title patient able to ambulate 352f aross level surfaces using RW and light CGA    Baseline 1110fwith RW across level surfaces with CGA and WC follow; 01/22/21 Able to ambulate 23023fith RW  and SBA/CGA; 02/15/21 Ambulation of 345f56fross level ground with light CGA using RW w/o simulated AFO    Time 4    Period Weeks    Status Achieved    Target Date 01/16/21      PT SHORT TERM GOAL #4   Title patient to demo I in bed mobility with focus on control of RLE    Baseline requires CGA to transition from sit/supine needing to scoop RLE onto bed using LLE; 01/22/21 PAtient I in bed mobility accomodating for RLE weakness appropriately    Time 4    Period Weeks    Status Achieved    Target Date 01/16/21      PT SHORT TERM GOAL #5   Title Assess BERG and set appropriate goal    Baseline Berg performed and LTG written    Time 4    Period Weeks    Status Achieved    Target Date 01/16/21               PT Long Term Goals - 08/03/21 1425       PT LONG TERM GOAL #1   Title Pt will increase Berg score from 25/56 to >30/56 for improved balance and decreased fall risk. (All LTGs due 02/13/21)    Baseline 12/29/20 25/56; 02/15/21 BERG score 35    Period Weeks    Status Achieved      PT LONG TERM GOAL #2   Title Ambulate 500ft43fh LRAD across level and unlevel ground with S    Baseline 05/24/21 Ambulation of 500ft 63futdoors with S and rollator; ambulation of 275ft i68finic with RW and S; 08/03/21 ambulation of 500ft in27fic with RW and SBA    Time 12    Period Weeks    Status Partially Met      PT LONG TERM GOAL #3   Title Incerase R hip strength to 3/5 to allow effective swing through in gait cycle    Baseline 08/03/21 See note section for individual sites    Time 12    Period Weeks    Status On-going      PT LONG TERM GOAL #4   Title Patient to demo I in all transfers    Baseline Continues to require S for all transfers; 07/23/21 patient is I in sit/stand and stand pivot transfers transfers, have not attempted  floor transfers    Time 12    Period Weeks    Status On-going      PT LONG TERM GOAL #5   Title patient will ambulate room to room distances with LRAd under  S    Baseline 153f with RW and CGA    Time 8    Period Weeks    Status Achieved      PT LONG TERM GOAL #6   Title Patient to perform 4" step up on RLE using BUE support 1 or more times    Baseline Able to place R foot on 2" block wth BUE support 1x; 10/24 unable to place R foot onto 4" step    Time 12    Period Weeks    Status On-going            Pt seen for aquatic therapy today.  Treatment took place in water 3.25-4.8 ft [facility] Temp of water was 91.  Pt entered/exited pool using stairs, alternating pattern with min assist   Warm up: water walking supported by hand buoys x 4 lengths forward and backward supported by blue noodle.   Attempted core strengthening using Pilates modified plank position without success due to right gr toe pain   Step ups completed in chest deep water forward then side ways multiple reps. VC for improved eccentric control rle, balance and patience. Pt with improvement as reps continue with good ms recruitment.   Suspended sup for core strengthening holding position with vc for positioning 3 x 20-25 seconds. Knees to chest in position facilitated by therapist 3 x 10 Manual stretching R ankle/foot and gr toe   Pt requires buoyancy for support and to offload joints with strengthening exercises. Viscosity of the water is needed for resistance of strengthening; water current perturbations provides challenge to standing balance unsupported, requiring increased core activation.       Plan - 08/22/21 1752     Clinical Impression Statement Pt limited by pain in right great toe.  Appears that nail is seperating from nail bed.  Pt to see podiatrist to address. This is adding to R gr toe pain caused by flexor tone with activity. Spent majority of session submerged to chest level working on [employer] and extension knee control, decreasing right le tone and encouraging improved wieght shift.  Pt with some depression today as  demonstrated with frustration with some activity responding with some crying.  Pt given encouragement.    Stability/Clinical Decision Making Stable/Uncomplicated    Clinical Decision Making Low    Rehab Potential Good    PT Frequency 1x / week    PT Duration 8 weeks    PT Treatment/Interventions ADLs/Self Care Home Management;Aquatic Therapy;Electrical Stimulation;DME Instruction;Gait training;Stair training;Functional mobility training;Therapeutic activities;Therapeutic exercise;Balance training;Neuromuscular re-education;Manual techniques;Wheelchair mobility training;Orthotic Fit/Training;Patient/family education    PT Next Visit Plan core stengthening/weight shifting, faciliation of rle movement    PT Home Exercise Plan [id number]            Patient will benefit from skilled therapeutic intervention in order to improve the following deficits and impairments:  Abnormal gait, Decreased range of motion, Difficulty walking, Decreased endurance, Decreased activity tolerance, Decreased balance, Decreased mobility, Decreased strength  Visit Diagnosis: Abnormal posture  Other abnormalities of gait and mobility  Unsteadiness on feet  Chronic pain of right knee  Difficulty in walking, not elsewhere classified  Muscle weakness (generalized)     Problem List Patient Active Problem  List   Diagnosis Date Noted   Disease of spinal cord ([place]) 05/21/2021   Anti-RNP antibodies present 05/21/2021   Weakness of both lower extremities 02/05/2021   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

## 2021-08-29 ENCOUNTER — Ambulatory Visit: Payer: BC Managed Care – PPO

## 2021-08-29 ENCOUNTER — Ambulatory Visit (HOSPITAL_BASED_OUTPATIENT_CLINIC_OR_DEPARTMENT_OTHER): Payer: Self-pay | Admitting: Physical Therapy

## 2021-08-29 ENCOUNTER — Other Ambulatory Visit: Payer: Self-pay

## 2021-08-29 DIAGNOSIS — R2689 Other abnormalities of gait and mobility: Secondary | ICD-10-CM

## 2021-08-29 DIAGNOSIS — R262 Difficulty in walking, not elsewhere classified: Secondary | ICD-10-CM

## 2021-08-29 DIAGNOSIS — R293 Abnormal posture: Secondary | ICD-10-CM

## 2021-08-29 DIAGNOSIS — R2681 Unsteadiness on feet: Secondary | ICD-10-CM

## 2021-08-29 NOTE — Therapy (Signed)
[facility] [address] [place], [place], [address] Phone: [phone]   Fax:  [phone]  Physical Therapy Treatment/DC Summary  Patient Details  Name: Maurice Cruz MRN: [id number] Date of Birth: 05/05/1966 Referring Provider (PT): Dr Courtney Heys   Encounter Date: 08/29/2021  PHYSICAL THERAPY DISCHARGE SUMMARY  Visits from Start of Care: 38  Current functional level related to goals / functional outcomes: Maximum function reached at this time   Remaining deficits: RLE paresis    Education / Equipment: HEP   Patient agrees to discharge. Patient goals were partially met. Patient is being discharged due to the patient's request.    PT End of Session - 08/29/21 1311     Visit Number 38    Number of Visits 40    Date for PT Re-Evaluation 07/24/21    Authorization Type [employer]- 60 visit limit (has used 12?)    Authorization - Number of Visits 48    Progress Note Due on Visit 40    PT Start Time 1315    PT Stop Time 1400    PT Time Calculation (min) 45 min    Equipment Utilized During Treatment Other (comment);Gait belt    Activity Tolerance Patient tolerated treatment well;No increased pain    Behavior During Therapy WFL for tasks assessed/performed             Past Medical History:  Diagnosis Date   Anxiety    Asthma    as a child   Depression    ED (erectile dysfunction)    GERD (gastroesophageal reflux disease)    Hx of spinal cord injury 03/2020   Hypertension    Neurogenic bladder    self caths   Neurogenic bowel    has to be digitially stimulated - 04/24/21   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[place])     Past Surgical History:  Procedure Laterality Date   Anorectal biospy  02/09/2021   Colon Biospy  02/09/2021   IR ANGIO INTRA EXTRACRAN SEL COM CAROTID INNOMINATE BILAT MOD SED  04/25/2021   IR ANGIO VERTEBRAL SEL SUBCLAVIAN INNOMINATE UNI L MOD SED  04/25/2021    IR ANGIO VERTEBRAL SEL VERTEBRAL UNI R MOD SED  04/25/2021   IR ANGIO/SPINAL LEFT  04/25/2021   IR ANGIO/SPINAL LEFT  04/25/2021   IR ANGIO/SPINAL LEFT  04/25/2021   IR ANGIO/SPINAL LEFT  04/25/2021   IR ANGIO/SPINAL LEFT  04/25/2021   IR ANGIO/SPINAL LEFT  04/25/2021   IR ANGIO/SPINAL LEFT  04/25/2021   IR ANGIO/SPINAL LEFT  04/25/2021   IR ANGIO/SPINAL LEFT  04/25/2021   IR ANGIO/SPINAL LEFT  04/25/2021   IR ANGIO/SPINAL LEFT  04/25/2021   IR ANGIO/SPINAL LEFT  04/25/2021   IR ANGIO/SPINAL LEFT  04/25/2021   IR ANGIO/SPINAL RIGHT  04/25/2021   IR ANGIO/SPINAL RIGHT  04/25/2021   IR ANGIO/SPINAL RIGHT  04/25/2021   IR ANGIO/SPINAL RIGHT  04/25/2021   IR ANGIO/SPINAL RIGHT  04/25/2021   IR ANGIO/SPINAL RIGHT  04/25/2021   IR ANGIO/SPINAL RIGHT  04/25/2021   IR ANGIO/SPINAL RIGHT  04/25/2021   IR ANGIO/SPINAL RIGHT  04/25/2021   IR ANGIO/SPINAL RIGHT  04/25/2021   IR ANGIO/SPINAL RIGHT  04/25/2021   IR ANGIO/SPINAL RIGHT  04/25/2021   IR ANGIO/SPINAL RIGHT  04/25/2021   IR ANGIO/SPINAL RIGHT  04/25/2021   IR ANGIOGRAM EXTREMITY BILATERAL  04/25/2021   IR RADIOLOGIST EVAL & MGMT  03/21/2021   RADIOLOGY  WITH ANESTHESIA N/A 04/25/2021   Procedure: IR WITH ANESTHESIA SPINAL ANGIOGRAM;  Surgeon: Luanne Bras, MD;  Location: [facility];  Service: Radiology;  Laterality: N/A;    There were no vitals filed for this visit.   Subjective Assessment - 08/29/21 1315     Subjective No changes to note.  Continues to report issues with R great toe and will have a podiatry consult ASAP.  Spent Thanksgiving day in bed.    Patient is accompained by: Family member    Pertinent History Began to notice symptoms in 11/21, had 2 week hospital stay followed by 1 mo. at [facility] in [place].  Was receiving OPPT at [facility] for LE strengthening however referring MD recommended this clinic, has since moved in with parents for physical as well as financial assistance and has obtained and installed a chair lift, denies LE pain  but has a hx of low back pain worse with prolonged sitting    Limitations Standing;Walking    How long can you sit comfortably? 30 min    How long can you stand comfortably? 5 min    How long can you walk comfortably? 5 min                               [facility] Adult PT Treatment/Exercise - 08/29/21 0001       Transfers   Transfers Sit to Stand    Sit to Stand 6: Modified independent (Device/Increase time)    Stand to Sit 6: Modified independent (Device/Increase time)    Stand Pivot Transfers 6: Modified independent (Device/Increase time)    Lateral/Scoot Transfers 6: Modified independent (Device/Increase time)      Ambulation/Gait   Ambulation/Gait Yes    Ambulation/Gait Assistance 5: Supervision;4: Min guard    Ambulation Distance (Feet) 0.15 Feet    Assistive device Body weight support system    Gait Pattern Step-through pattern    Ambulation Surface Level;Indoor    Gait Comments 0.15 miles walkied in 15 min time period                 Balance Exercises - 08/29/21 0001       Balance Exercises: Standing   Stepping Strategy Anterior;Posterior;Lateral;UE support;Limitations    Stepping Strategy Limitations Attempted to place R foot onto 2" block (following treadmil walking) and able to slide foot forward onto block but could not perform laterally or posteriorly                  PT Short Term Goals - 02/15/21 0956       PT SHORT TERM GOAL #1   Title Patient to demo initial HEP back to PT w/o need of cuing    Baseline initial HEP issued today; 01/22/21 HEP updated today, initial HEP demoed by patient    Time 4    Period Weeks    Status Achieved    Target Date 01/16/21      PT SHORT TERM GOAL #2   Title patine to able to demo stand pivot and STS transfers with S    Baseline able to perform stand pivot and STS transfers with CGA; 01/22/21 Able to perform STS transfer with UE support and only S    Time 4    Period Weeks    Status  Achieved    Target Date 01/16/21      PT SHORT TERM GOAL #3   Title patient able to  ambulate 375ft aross level surfaces using RW and light CGA    Baseline 179ft with RW across level surfaces with CGA and WC follow; 01/22/21 Able to ambulate 254ft with RW and SBA/CGA; 02/15/21 Ambulation of 339ft across level ground with light CGA using RW w/o simulated AFO    Time 4    Period Weeks    Status Achieved    Target Date 01/16/21      PT SHORT TERM GOAL #4   Title patient to demo I in bed mobility with focus on control of RLE    Baseline requires CGA to transition from sit/supine needing to scoop RLE onto bed using LLE; 01/22/21 PAtient I in bed mobility accomodating for RLE weakness appropriately    Time 4    Period Weeks    Status Achieved    Target Date 01/16/21      PT SHORT TERM GOAL #5   Title Assess BERG and set appropriate goal    Baseline Berg performed and LTG written    Time 4    Period Weeks    Status Achieved    Target Date 01/16/21               PT Long Term Goals - 08/29/21 1338       PT LONG TERM GOAL #1   Title Increas BERG score to 37    Baseline 12/29/20 25/56; 02/15/21 BERG score 35; 11/30/2 UTA due to time constraints    Time 4    Period Weeks    Status Revised    Target Date 09/24/21      PT LONG TERM GOAL #2   Title Ambulate 575ft with LRAD across level and unlevel ground with S    Baseline 05/24/21 Ambulation of 544ft in outdoors with S and rollator; ambulation of 251ft in clinic with RW and S; 08/03/21 ambulation of 566ft inclinic with RW and SBA    Time 12    Period Weeks    Status Partially Met    Target Date 09/24/21      PT LONG TERM GOAL #3   Title Incerase R hip strength to 3/5 to allow effective swing through in gait cycle    Baseline 08/03/21 See note section for individual sites; 11/30/2 UTA due to time constraints    Time 12    Period Weeks    Status On-going    Target Date 09/24/21      PT LONG TERM GOAL #4   Title Patient to demo I  in all transfers including floor transfers    Baseline Continues to require S for all transfers; 07/23/21 patient is I in sit/stand and stand pivot transfers transfers, have not attempted floor transfers; 11/30/2 UTA due to time constraints    Time 12    Period Weeks    Status On-going    Target Date 09/24/21      PT LONG TERM GOAL #5   Title patient will ambulate room to room distances with LRAD under S    Baseline 198ft with RW and CGA;    Time 8    Period Weeks    Status Achieved      PT LONG TERM GOAL #6   Title Patient to perform 4" step up on RLE using BUE support 1 or more times    Baseline Able to place R foot on 2" block wth BUE support 1x; 10/24 unable to place R foot onto 4" step; 08/29/21 Able to lift and slide  R foot onto 2" block    Time 12    Period Weeks    Status On-going                   Plan - 08/29/21 1335     Clinical Impression Statement Continued treadmill walking with body weight support, assessed LTGs as time allowed.  Patient has elected to cancel remaining sessions.    Personal Factors and Comorbidities Comorbidity 1    Comorbidities disease process, transverse myelitis    Examination-Activity Limitations Bed Mobility;Locomotion Level;Transfers;Continence;Toileting    Stability/Clinical Decision Making Stable/Uncomplicated    Rehab Potential Good    PT Frequency 1x / week    PT Duration 8 weeks    PT Treatment/Interventions ADLs/Self Care Home Management;Aquatic Therapy;Electrical Stimulation;DME Instruction;Gait training;Stair training;Functional mobility training;Therapeutic activities;Therapeutic exercise;Balance training;Neuromuscular re-education;Manual techniques;Wheelchair mobility training;Orthotic Fit/Training;Patient/family education    PT Next Visit Plan core stengthening/weight shifting, faciliation of rlb movement, gait with body weight support to maximize function, re-assess BERG and RLE strength via MMT    PT Home Exercise Plan  [id number]             Patient will benefit from skilled therapeutic intervention in order to improve the following deficits and impairments:  Abnormal gait, Decreased range of motion, Difficulty walking, Decreased endurance, Decreased activity tolerance, Decreased balance, Decreased mobility, Decreased strength  Visit Diagnosis: Abnormal posture  Unsteadiness on feet  Other abnormalities of gait and mobility  Difficulty in walking, not elsewhere classified     Problem List Patient Active Problem List   Diagnosis Date Noted   Disease of spinal cord ([place]) 05/21/2021   Anti-RNP antibodies present 05/21/2021   Weakness of both lower extremities 02/05/2021   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

## 2021-09-05 ENCOUNTER — Other Ambulatory Visit: Payer: Self-pay

## 2021-09-05 ENCOUNTER — Encounter (HOSPITAL_BASED_OUTPATIENT_CLINIC_OR_DEPARTMENT_OTHER): Payer: Self-pay | Admitting: Physical Therapy

## 2021-09-05 ENCOUNTER — Ambulatory Visit (HOSPITAL_BASED_OUTPATIENT_CLINIC_OR_DEPARTMENT_OTHER): Payer: BC Managed Care – PPO | Attending: Physical Medicine and Rehabilitation | Admitting: Physical Therapy

## 2021-09-05 DIAGNOSIS — R2689 Other abnormalities of gait and mobility: Secondary | ICD-10-CM | POA: Insufficient documentation

## 2021-09-05 DIAGNOSIS — G8929 Other chronic pain: Secondary | ICD-10-CM | POA: Insufficient documentation

## 2021-09-05 DIAGNOSIS — R262 Difficulty in walking, not elsewhere classified: Secondary | ICD-10-CM | POA: Insufficient documentation

## 2021-09-05 DIAGNOSIS — R2681 Unsteadiness on feet: Secondary | ICD-10-CM | POA: Diagnosis present

## 2021-09-05 DIAGNOSIS — M25561 Pain in right knee: Secondary | ICD-10-CM | POA: Insufficient documentation

## 2021-09-05 DIAGNOSIS — R293 Abnormal posture: Secondary | ICD-10-CM | POA: Insufficient documentation

## 2021-09-05 DIAGNOSIS — M6281 Muscle weakness (generalized): Secondary | ICD-10-CM | POA: Insufficient documentation

## 2021-09-05 DIAGNOSIS — G373 Acute transverse myelitis in demyelinating disease of central nervous system: Secondary | ICD-10-CM | POA: Diagnosis present

## 2021-09-05 NOTE — Therapy (Signed)
[facility] [address] [place], [place], [address] Phone: [phone]   Fax:  [phone]  Physical Therapy Treatment  Patient Details  Name: Maurice Cruz MRN: [id number] Date of Birth: September 28, 1966 Referring Provider (PT): Dr Courtney Heys   Encounter Date: 09/05/2021   PT End of Session - 09/05/21 1421     Visit Number 39    Number of Visits 40    Date for PT Re-Evaluation 07/24/21    Authorization Type [employer]- 60 visit limit (has used 12?)    Authorization - Number of Visits 48    Progress Note Due on Visit 40    PT Start Time 1417    PT Stop Time 1500    PT Time Calculation (min) 43 min    Equipment Utilized During Treatment Other (comment);Gait belt    Activity Tolerance Patient tolerated treatment well;No increased pain    Behavior During Therapy WFL for tasks assessed/performed             Past Medical History:  Diagnosis Date   Anxiety    Asthma    as a child   Depression    ED (erectile dysfunction)    GERD (gastroesophageal reflux disease)    Hx of spinal cord injury 03/2020   Hypertension    Neurogenic bladder    self caths   Neurogenic bowel    has to be digitially stimulated - 04/24/21   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[place])     Past Surgical History:  Procedure Laterality Date   Anorectal biospy  02/09/2021   Colon Biospy  02/09/2021   IR ANGIO INTRA EXTRACRAN SEL COM CAROTID INNOMINATE BILAT MOD SED  04/25/2021   IR ANGIO VERTEBRAL SEL SUBCLAVIAN INNOMINATE UNI L MOD SED  04/25/2021   IR ANGIO VERTEBRAL SEL VERTEBRAL UNI R MOD SED  04/25/2021   IR ANGIO/SPINAL LEFT  04/25/2021   IR ANGIO/SPINAL LEFT  04/25/2021   IR ANGIO/SPINAL LEFT  04/25/2021   IR ANGIO/SPINAL LEFT  04/25/2021   IR ANGIO/SPINAL LEFT  04/25/2021   IR ANGIO/SPINAL LEFT  04/25/2021   IR ANGIO/SPINAL LEFT  04/25/2021   IR ANGIO/SPINAL LEFT  04/25/2021   IR ANGIO/SPINAL LEFT  04/25/2021   IR ANGIO/SPINAL  LEFT  04/25/2021   IR ANGIO/SPINAL LEFT  04/25/2021   IR ANGIO/SPINAL LEFT  04/25/2021   IR ANGIO/SPINAL LEFT  04/25/2021   IR ANGIO/SPINAL RIGHT  04/25/2021   IR ANGIO/SPINAL RIGHT  04/25/2021   IR ANGIO/SPINAL RIGHT  04/25/2021   IR ANGIO/SPINAL RIGHT  04/25/2021   IR ANGIO/SPINAL RIGHT  04/25/2021   IR ANGIO/SPINAL RIGHT  04/25/2021   IR ANGIO/SPINAL RIGHT  04/25/2021   IR ANGIO/SPINAL RIGHT  04/25/2021   IR ANGIO/SPINAL RIGHT  04/25/2021   IR ANGIO/SPINAL RIGHT  04/25/2021   IR ANGIO/SPINAL RIGHT  04/25/2021   IR ANGIO/SPINAL RIGHT  04/25/2021   IR ANGIO/SPINAL RIGHT  04/25/2021   IR ANGIO/SPINAL RIGHT  04/25/2021   IR ANGIOGRAM EXTREMITY BILATERAL  04/25/2021   IR RADIOLOGIST EVAL & MGMT  03/21/2021   RADIOLOGY WITH ANESTHESIA N/A 04/25/2021   Procedure: IR WITH ANESTHESIA SPINAL ANGIOGRAM;  Surgeon: Luanne Bras, MD;  Location: [facility];  Service: Radiology;  Laterality: N/A;    There were no vitals filed for this visit.   Subjective Assessment - 09/05/21 1420     Subjective Has not yets seen podiatrist.  No changes.  PT Short Term Goals - 02/15/21 0956       PT SHORT TERM GOAL #1   Title Patient to demo initial HEP back to PT w/o need of cuing    Baseline initial HEP issued today; 01/22/21 HEP updated today, initial HEP demoed by patient    Time 4    Period Weeks    Status Achieved    Target Date 01/16/21      PT SHORT TERM GOAL #2   Title patine to able to demo stand pivot and STS transfers with S    Baseline able to perform stand pivot and STS transfers with CGA; 01/22/21 Able to perform STS transfer with UE support and only S    Time 4    Period Weeks    Status Achieved    Target Date 01/16/21      PT SHORT TERM GOAL #3   Title patient able to ambulate 361f aross level surfaces using RW and light CGA    Baseline 1152fwith RW across level surfaces with CGA and WC follow; 01/22/21 Able to ambulate 23038fwith RW and SBA/CGA; 02/15/21 Ambulation of 345f58fross level ground with light CGA using RW w/o simulated AFO    Time 4    Period Weeks    Status Achieved    Target Date 01/16/21      PT SHORT TERM GOAL #4   Title patient to demo I in bed mobility with focus on control of RLE    Baseline requires CGA to transition from sit/supine needing to scoop RLE onto bed using LLE; 01/22/21 PAtient I in bed mobility accomodating for RLE weakness appropriately    Time 4    Period Weeks    Status Achieved    Target Date 01/16/21      PT SHORT TERM GOAL #5   Title Assess BERG and set appropriate goal    Baseline Berg performed and LTG written    Time 4    Period Weeks    Status Achieved    Target Date 01/16/21               PT Long Term Goals - 08/29/21 1338       PT LONG TERM GOAL #1   Title Increas BERG score to 37    Baseline 12/29/20 25/56; 02/15/21 BERG score 35; 11/30/2 UTA due to time constraints    Time 4    Period Weeks    Status Revised    Target Date 09/24/21      PT LONG TERM GOAL #2   Title Ambulate 500ft21fh LRAD across level and unlevel ground with S    Baseline 05/24/21 Ambulation of 500ft 74futdoors with S and rollator; ambulation of 275ft i10finic with RW and S; 08/03/21 ambulation of 500ft in75fic with RW and SBA    Time 12    Period Weeks    Status Partially Met    Target Date 09/24/21      PT LONG TERM GOAL #3   Title Incerase R hip strength to 3/5 to allow effective swing through in gait cycle    Baseline 08/03/21 See note section for individual sites; 11/30/2 UTA due to time constraints    Time 12    Period Weeks    Status On-going    Target Date 09/24/21      PT LONG TERM GOAL #4   Title Patient to demo I in all transfers including floor transfers  Baseline Continues to require S for all transfers; 07/23/21 patient is I in sit/stand and stand pivot transfers transfers, have not attempted floor transfers; 11/30/2 UTA due to time constraints    Time  12    Period Weeks    Status On-going    Target Date 09/24/21      PT LONG TERM GOAL #5   Title patient will ambulate room to room distances with LRAD under S    Baseline 12f with RW and CGA;    Time 8    Period Weeks    Status Achieved      PT LONG TERM GOAL #6   Title Patient to perform 4" step up on RLE using BUE support 1 or more times    Baseline Able to place R foot on 2" block wth BUE support 1x; 10/24 unable to place R foot onto 4" step; 08/29/21 Able to lift and slide R foot onto 2" block    Time 12    Period Weeks    Status On-going            Pt seen for aquatic therapy today.  Treatment took place in water 3.25-4.[facility] Temp of [facility] was 91.  Pt entered/exited pool using stairs, alternating pattern with min assist   Warm up: water walking supported by hand buoys x 8 widths forward and backward supported by blue noodle.   Step ups completed in 3.5 ft forward then side ways multiple reps.   Suspended sup  Posterior chain ex: hip extension; knee flex 2 x 10 Knee flex/extension R/L the bilaterally x 10  Sitting balance on noodle. Stair climbing 5 steps with mod-min assist rising last 2 steps with rle.   Pt requires buoyancy for support and to offload joints with strengthening exercises. Viscosity of the water is needed for resistance of strengthening; water current perturbations provides challenge to standing balance unsupported, requiring increased core activation.         Plan - 09/05/21 1505     Clinical Impression Statement Pt reporting his head is not in the right place. Has canceled remaining on land therapy visits. One vist for aquatic before dc.  Has put driving on hold until the new year. Completed posterior chain strengthening today, used suspended supine position for relaxation as well. He has improved with stair climbing ability requiring less assist. Pt will be ready for dc. He reports compliance with land  HEP.  Does not have access to a pool for aquatic HEP.    Stability/Clinical Decision Making Stable/Uncomplicated    Clinical Decision Making Low    Rehab Potential Good    PT Frequency 1x / week    PT Duration 8 weeks    PT Treatment/Interventions ADLs/Self Care Home Management;Aquatic Therapy;Electrical Stimulation;DME Instruction;Gait training;Stair training;Functional mobility training;Therapeutic activities;Therapeutic exercise;Balance training;Neuromuscular re-education;Manual techniques;Wheelchair mobility training;Orthotic Fit/Training;Patient/family education    PT Next Visit Plan DC             Patient will benefit from skilled therapeutic intervention in order to improve the following deficits and impairments:  Abnormal gait, Decreased range of motion, Difficulty walking, Decreased endurance, Decreased activity tolerance, Decreased balance, Decreased mobility, Decreased strength  Visit Diagnosis: Abnormal posture  Unsteadiness on feet  Other abnormalities of gait and mobility  Difficulty in walking, not elsewhere classified     Problem List Patient Active Problem List   Diagnosis Date Noted   Disease of spinal cord ([place] 05/21/2021  Anti-RNP antibodies present 05/21/2021   Weakness of both lower extremities 02/05/2021   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

## 2021-09-12 ENCOUNTER — Ambulatory Visit: Payer: BC Managed Care – PPO

## 2021-09-12 ENCOUNTER — Ambulatory Visit (HOSPITAL_BASED_OUTPATIENT_CLINIC_OR_DEPARTMENT_OTHER): Payer: Self-pay | Admitting: Physical Therapy

## 2021-09-19 ENCOUNTER — Ambulatory Visit (HOSPITAL_BASED_OUTPATIENT_CLINIC_OR_DEPARTMENT_OTHER): Payer: BC Managed Care – PPO | Admitting: Physical Therapy

## 2021-09-19 ENCOUNTER — Other Ambulatory Visit: Payer: Self-pay

## 2021-09-19 ENCOUNTER — Encounter (HOSPITAL_BASED_OUTPATIENT_CLINIC_OR_DEPARTMENT_OTHER): Payer: Self-pay | Admitting: Physical Therapy

## 2021-09-19 DIAGNOSIS — R293 Abnormal posture: Secondary | ICD-10-CM

## 2021-09-19 DIAGNOSIS — R2681 Unsteadiness on feet: Secondary | ICD-10-CM

## 2021-09-19 DIAGNOSIS — G8929 Other chronic pain: Secondary | ICD-10-CM

## 2021-09-19 DIAGNOSIS — G373 Acute transverse myelitis in demyelinating disease of central nervous system: Secondary | ICD-10-CM

## 2021-09-19 DIAGNOSIS — M6281 Muscle weakness (generalized): Secondary | ICD-10-CM

## 2021-09-19 DIAGNOSIS — R2689 Other abnormalities of gait and mobility: Secondary | ICD-10-CM

## 2021-09-19 DIAGNOSIS — R262 Difficulty in walking, not elsewhere classified: Secondary | ICD-10-CM

## 2021-09-19 NOTE — Therapy (Signed)
[facility] [address] [place], [place], [address] Phone: [phone]   Fax:  [phone]  Physical Therapy Treatment  Patient Details  Name: Maurice Cruz MRN: [id number] Date of Birth: 24-Jan-1966 Referring Provider (PT): Dr Courtney Heys   Encounter Date: 09/19/2021   PT End of Session - 09/19/21 1414     Visit Number 40    Number of Visits 40    Date for PT Re-Evaluation 09/24/21    Authorization Type [employer]- 60 visit limit (has used 12?)    Authorization - Number of Visits 48    Progress Note Due on Visit 40    PT Start Time 1415    PT Stop Time 1500    PT Time Calculation (min) 45 min    Equipment Utilized During Treatment Other (comment);Gait belt    Activity Tolerance Patient tolerated treatment well;No increased pain    Behavior During Therapy WFL for tasks assessed/performed             Past Medical History:  Diagnosis Date   Anxiety    Asthma    as a child   Depression    ED (erectile dysfunction)    GERD (gastroesophageal reflux disease)    Hx of spinal cord injury 03/2020   Hypertension    Neurogenic bladder    self caths   Neurogenic bowel    has to be digitially stimulated - 04/24/21   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[place])     Past Surgical History:  Procedure Laterality Date   Anorectal biospy  02/09/2021   Colon Biospy  02/09/2021   IR ANGIO INTRA EXTRACRAN SEL COM CAROTID INNOMINATE BILAT MOD SED  04/25/2021   IR ANGIO VERTEBRAL SEL SUBCLAVIAN INNOMINATE UNI L MOD SED  04/25/2021   IR ANGIO VERTEBRAL SEL VERTEBRAL UNI R MOD SED  04/25/2021   IR ANGIO/SPINAL LEFT  04/25/2021   IR ANGIO/SPINAL LEFT  04/25/2021   IR ANGIO/SPINAL LEFT  04/25/2021   IR ANGIO/SPINAL LEFT  04/25/2021   IR ANGIO/SPINAL LEFT  04/25/2021   IR ANGIO/SPINAL LEFT  04/25/2021   IR ANGIO/SPINAL LEFT  04/25/2021   IR ANGIO/SPINAL LEFT  04/25/2021   IR ANGIO/SPINAL LEFT  04/25/2021   IR  ANGIO/SPINAL LEFT  04/25/2021   IR ANGIO/SPINAL LEFT  04/25/2021   IR ANGIO/SPINAL LEFT  04/25/2021   IR ANGIO/SPINAL LEFT  04/25/2021   IR ANGIO/SPINAL RIGHT  04/25/2021   IR ANGIO/SPINAL RIGHT  04/25/2021   IR ANGIO/SPINAL RIGHT  04/25/2021   IR ANGIO/SPINAL RIGHT  04/25/2021   IR ANGIO/SPINAL RIGHT  04/25/2021   IR ANGIO/SPINAL RIGHT  04/25/2021   IR ANGIO/SPINAL RIGHT  04/25/2021   IR ANGIO/SPINAL RIGHT  04/25/2021   IR ANGIO/SPINAL RIGHT  04/25/2021   IR ANGIO/SPINAL RIGHT  04/25/2021   IR ANGIO/SPINAL RIGHT  04/25/2021   IR ANGIO/SPINAL RIGHT  04/25/2021   IR ANGIO/SPINAL RIGHT  04/25/2021   IR ANGIO/SPINAL RIGHT  04/25/2021   IR ANGIOGRAM EXTREMITY BILATERAL  04/25/2021   IR RADIOLOGIST EVAL & MGMT  03/21/2021   RADIOLOGY WITH ANESTHESIA N/A 04/25/2021   Procedure: IR WITH ANESTHESIA SPINAL ANGIOGRAM;  Surgeon: Luanne Bras, MD;  Location: [facility];  Service: Radiology;  Laterality: N/A;    There were no vitals filed for this visit.   Subjective Assessment - 09/19/21 1716     Subjective No changes    Currently in Pain? No/denies  PT Short Term Goals - 02/15/21 0956       PT SHORT TERM GOAL #1   Title Patient to demo initial HEP back to PT w/o need of cuing    Baseline initial HEP issued today; 01/22/21 HEP updated today, initial HEP demoed by patient    Time 4    Period Weeks    Status Achieved    Target Date 01/16/21      PT SHORT TERM GOAL #2   Title patine to able to demo stand pivot and STS transfers with S    Baseline able to perform stand pivot and STS transfers with CGA; 01/22/21 Able to perform STS transfer with UE support and only S    Time 4    Period Weeks    Status Achieved    Target Date 01/16/21      PT SHORT TERM GOAL #3   Title patient able to ambulate 387ft aross level surfaces using RW and light CGA    Baseline 182ft with RW across level surfaces with CGA and WC follow; 01/22/21 Able to  ambulate 227ft with RW and SBA/CGA; 02/15/21 Ambulation of 353ft across level ground with light CGA using RW w/o simulated AFO    Time 4    Period Weeks    Status Achieved    Target Date 01/16/21      PT SHORT TERM GOAL #4   Title patient to demo I in bed mobility with focus on control of RLE    Baseline requires CGA to transition from sit/supine needing to scoop RLE onto bed using LLE; 01/22/21 PAtient I in bed mobility accomodating for RLE weakness appropriately    Time 4    Period Weeks    Status Achieved    Target Date 01/16/21      PT SHORT TERM GOAL #5   Title Assess BERG and set appropriate goal    Baseline Berg performed and LTG written    Time 4    Period Weeks    Status Achieved    Target Date 01/16/21               PT Long Term Goals - 09/19/21 1721       PT LONG TERM GOAL #1   Baseline NT due to pt cancelling on land appointment    Status Not Met      PT LONG TERM GOAL #2   Baseline Goal met x 3 weeks ago    Status Achieved      PT LONG TERM GOAL #3   Baseline MMT today 2 to 2+/5    Status Not Met      PT LONG TERM GOAL #4   Baseline Floor transfer not completed due to pt cancelling on land appointment    Status Partially Met      PT LONG TERM GOAL #5   Baseline 12/21 Pt reports he walks from bedroom to and from bathroom daily      PT LONG TERM GOAL #6   Baseline Pt rquires assist to raise rle onto water step >4 inches.    Status Not Met            Pt seen for aquatic therapy today.  Treatment took place in water 3.25-4.8 ft in depth at the [facility] pool. Temp of water was 91.  Pt entered/exited pool using stairs, alternating pattern with min assist   Warm up: water walking supported by hand buoys x 8 widths forward and  backward supported by blue noodle.   [place], Pt with lumbar belt around hips and nek doodle for neck support.  .  Pt assisted into supine floating position. PT at torso and assisting with trunk left to right and  vice versa to engage trunk muscles. PT then rotated trunk in order to engage abdomnal (internal and external obliques) . -Posterior chain ex: hip extension; knee flex 2 x 10  Hip knee push pull bilaterally x 10, then unilaterally x 10 R/L     Pt requires buoyancy for support and to offload joints with strengthening exercises. Viscosity of the water is needed for resistance of strengthening; water current perturbations provides challenge to standing balance unsupported, requiring increased core activation.       Plan - 09/19/21 1726     Clinical Impression Statement Pt being DC from therapy today having reached his maximum potential at this time.  He continues to work on getting his Management consultant.  Majority of goals met. He is indep with HEP    PT Treatment/Interventions ADLs/Self Care Home Management;Aquatic Therapy;Electrical Stimulation;DME Instruction;Gait training;Stair training;Functional mobility training;Therapeutic activities;Therapeutic exercise;Balance training;Neuromuscular re-education;Manual techniques;Wheelchair mobility training;Orthotic Fit/Training;Patient/family education    Consulted and Agree with Plan of Care Patient             Patient will benefit from skilled therapeutic intervention in order to improve the following deficits and impairments:  Abnormal gait, Decreased range of motion, Difficulty walking, Decreased endurance, Decreased activity tolerance, Decreased balance, Decreased mobility, Decreased strength  Visit Diagnosis: Abnormal posture  Unsteadiness on feet  Other abnormalities of gait and mobility  Difficulty in walking, not elsewhere classified  Chronic pain of right knee  Muscle weakness (generalized)  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[facility])     Problem List Patient Active Problem List   Diagnosis Date Noted   Disease of spinal cord ([place]) 05/21/2021   Anti-RNP antibodies present 05/21/2021   Weakness of both lower extremities 02/05/2021    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 Amb with fww, uses WC for main mode of mobility   Remaining deficits: Stair climbing deficits, needs assistance with adl's     Education / Equipment: Disease management, HEP, adaptive equipment for car   Patient agrees to discharge. Patient goals were partially met. Patient is being discharged due to maximized rehab potential.   Stanton Kidney Tharon Aquas) Zariah Cavendish MPT 09/19/2021, 5:29 PM  [facility] [address] [place], [place], [address] Phone: [phone]   Fax:  [phone]  Name: Maurice Cruz MRN: [id number] Date of Birth: Dec 07, 1965

## 2021-10-02 ENCOUNTER — Ambulatory Visit: Payer: BC Managed Care – PPO

## 2021-10-03 ENCOUNTER — Ambulatory Visit: Payer: BC Managed Care – PPO

## 2021-10-09 ENCOUNTER — Ambulatory Visit: Payer: BC Managed Care – PPO

## 2021-10-15 ENCOUNTER — Ambulatory Visit: Payer: BC Managed Care – PPO | Admitting: Physical Medicine and Rehabilitation

## 2021-10-16 ENCOUNTER — Ambulatory Visit: Payer: BC Managed Care – PPO

## 2021-10-23 ENCOUNTER — Ambulatory Visit: Payer: BC Managed Care – PPO

## 2021-10-26 ENCOUNTER — Encounter: Payer: Self-pay | Admitting: Physical Medicine and Rehabilitation

## 2021-10-26 ENCOUNTER — Encounter
Payer: BC Managed Care – PPO | Attending: Physical Medicine and Rehabilitation | Admitting: Physical Medicine and Rehabilitation

## 2021-10-26 ENCOUNTER — Other Ambulatory Visit: Payer: Self-pay

## 2021-10-26 VITALS — BP 122/75 | HR 51 | Temp 98.3°F | Ht 70.0 in

## 2021-10-26 DIAGNOSIS — N319 Neuromuscular dysfunction of bladder, unspecified: Secondary | ICD-10-CM | POA: Insufficient documentation

## 2021-10-26 DIAGNOSIS — G8220 Paraplegia, unspecified: Secondary | ICD-10-CM | POA: Diagnosis present

## 2021-10-26 DIAGNOSIS — G373 Acute transverse myelitis in demyelinating disease of central nervous system: Secondary | ICD-10-CM | POA: Insufficient documentation

## 2021-10-26 DIAGNOSIS — Z993 Dependence on wheelchair: Secondary | ICD-10-CM | POA: Diagnosis present

## 2021-10-26 DIAGNOSIS — R29898 Other symptoms and signs involving the musculoskeletal system: Secondary | ICD-10-CM | POA: Diagnosis present

## 2021-10-26 DIAGNOSIS — K592 Neurogenic bowel, not elsewhere classified: Secondary | ICD-10-CM | POA: Insufficient documentation

## 2021-10-26 DIAGNOSIS — R252 Cramp and spasm: Secondary | ICD-10-CM | POA: Diagnosis present

## 2021-10-26 MED ORDER — DOCUSATE SODIUM 283 MG RE ENEM: 1.0000 | ENEMA | Freq: Every day | RECTAL | 5 refills | Status: DC

## 2021-10-26 MED ORDER — DIAZEPAM 2 MG PO TABS: 2.0000 mg | ORAL_TABLET | Freq: Two times a day (BID) | ORAL | 5 refills | Status: DC

## 2021-10-26 MED ORDER — TIZANIDINE HCL 4 MG PO TABS: 2.0000 mg | ORAL_TABLET | Freq: Two times a day (BID) | ORAL | 5 refills | Status: DC

## 2021-10-26 NOTE — Progress Notes (Signed)
Subjective:    Patient ID: Maurice Cruz, male    DOB: 1966-09-12, 56 y.o.   MRN: [id number]  HPI  Pt is a 56 yr old R handed male with hx of HTN who developed transverse myelitis d'xd in 7/21. With neurogenic bowel and bladder and spasticity-   S/P steroid IV and IVIG- no plasmapheresis. now on meds for BP.  Here for f/u on transverse myelitis/paraplegia. Also has spasticity of RLE and depression.   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   Had a fall back in December-   Stood up to pull up Pj's and legs gave out and went to the floor.  Didn't get hurt.   Spasms have changed- knots and aches in R anterior hip occurs frequently during the day.  Describes cramping, knotting up right over R ASIS.   Was released from therapy in December-  Did feel like was helpful- water aerobics was great.  Was doing 1x/week of pool therapy and 1x/week outpt PT/land therapy.   Social Security wants pt to see "one of their doctors"- also didn't have my contact info- said would send info to me to fill out.  Is trying to get disability. Has gotten a Chief Executive Officer.   Pushing it to sit up more than 1 hour- due to spasticity and pain.   Spasticity - takes 2 mg BID- spasticity little better, but not controlled. Baclofen wasn't helpful/side effects-  Hasn't tried Valium.   Tramadol doesn't help ASIS pain really- has increased over the last month as well. Constant pain - knotting up;  Worse with movement Pain has been worse since the fall in December- had before the fall, but surely has gotten worse.   Pain- tramadol- takes 1 every 2-3 days- - last usage last week. Tries to not take every day. Last time actually refilled Tramadol was 10/22.   Neurogenic bladder - had UTI back in  December- couldn't get catheter in- and found to have UTI- got coude' which helped- sees Urology again next month.  Cathing 4-5x/day.   Bowel- same       Pain Inventory Average Pain 3 Pain Right Now 2 My pain is constant and sharp  LOCATION OF PAIN  back, buttocks, hip, knee  BOWEL Number of stools per week: 1-2 Oral laxative use Yes  Type of laxative Dulcolax Enema or suppository use No  History of colostomy No  Incontinent No   BLADDER Foley In and out cath, frequency 3-4 times a day Able to self cath Yes  Bladder incontinence No  Frequent urination No  Leakage with coughing No  Difficulty starting stream Yes  Incomplete bladder emptying Yes    Mobility use a walker how many minutes can you walk? 10-15 ability to climb steps?  no do you drive?  no use a wheelchair transfers alone Do you have any goals in this area?  yes  Function disabled: date disabled 04/13/20 I need assistance with the following:  meal prep and household duties Do you have any goals in this area?  yes  Neuro/Psych spasms depression  Prior Studies CT/MRI  Physicians involved in your care Any changes since last visit?  no   Family History  Problem Relation Age of  Onset   Cancer Maternal Grandmother    Social History   Socioeconomic History   Marital status: Single    Spouse name: Not on file   Number of children: 0   Years of education: HS   Highest education level: Not on file  Occupational History   Occupation: On disability  Tobacco Use   Smoking status: Never   Smokeless tobacco: Never  Vaping Use   Vaping Use: Never used  Substance and Sexual Activity   Alcohol use: Not Currently   Drug use: Never   Sexual activity: Not Currently  Other Topics Concern   Not on file  Social History Narrative   Right handed   1 glass tea per day   Coffee sometimes    No Soda   Lives with parents.   Social Determinants of Health   Financial Resource Strain: Not on file   Food Insecurity: Not on file  Transportation Needs: Not on file  Physical Activity: Not on file  Stress: Not on file  Social Connections: Not on file   Past Surgical History:  Procedure Laterality Date   Anorectal biospy  02/09/2021   Colon Biospy  02/09/2021   IR ANGIO INTRA EXTRACRAN SEL COM CAROTID INNOMINATE BILAT MOD SED  04/25/2021   IR ANGIO VERTEBRAL SEL SUBCLAVIAN INNOMINATE UNI L MOD SED  04/25/2021   IR ANGIO VERTEBRAL SEL VERTEBRAL UNI R MOD SED  04/25/2021   IR ANGIO/SPINAL LEFT  04/25/2021   IR ANGIO/SPINAL LEFT  04/25/2021   IR ANGIO/SPINAL LEFT  04/25/2021   IR ANGIO/SPINAL LEFT  04/25/2021   IR ANGIO/SPINAL LEFT  04/25/2021   IR ANGIO/SPINAL LEFT  04/25/2021   IR ANGIO/SPINAL LEFT  04/25/2021   IR ANGIO/SPINAL LEFT  04/25/2021   IR ANGIO/SPINAL LEFT  04/25/2021   IR ANGIO/SPINAL LEFT  04/25/2021   IR ANGIO/SPINAL LEFT  04/25/2021   IR ANGIO/SPINAL LEFT  04/25/2021   IR ANGIO/SPINAL LEFT  04/25/2021   IR ANGIO/SPINAL RIGHT  04/25/2021   IR ANGIO/SPINAL RIGHT  04/25/2021   IR ANGIO/SPINAL RIGHT  04/25/2021   IR ANGIO/SPINAL RIGHT  04/25/2021   IR ANGIO/SPINAL RIGHT  04/25/2021   IR ANGIO/SPINAL RIGHT  04/25/2021   IR ANGIO/SPINAL RIGHT  04/25/2021   IR ANGIO/SPINAL RIGHT  04/25/2021   IR ANGIO/SPINAL RIGHT  04/25/2021   IR ANGIO/SPINAL RIGHT  04/25/2021   IR ANGIO/SPINAL RIGHT  04/25/2021   IR ANGIO/SPINAL RIGHT  04/25/2021   IR ANGIO/SPINAL RIGHT  04/25/2021   IR ANGIO/SPINAL RIGHT  04/25/2021   IR ANGIOGRAM EXTREMITY BILATERAL  04/25/2021   IR RADIOLOGIST EVAL & MGMT  03/21/2021   RADIOLOGY WITH ANESTHESIA N/A 04/25/2021   Procedure: IR WITH ANESTHESIA SPINAL ANGIOGRAM;  Surgeon: Luanne Bras, MD;  Location: [facility];  Service: Radiology;  Laterality: N/A;   Past Medical History:  Diagnosis Date   Anxiety    Asthma    as a child   Depression    ED (erectile dysfunction)    GERD (gastroesophageal reflux disease)    Hx of spinal cord injury 03/2020   Hypertension     Neurogenic bladder    self caths   Neurogenic bowel    has to be digitially stimulated - 04/24/21   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[place])    bilateral legs   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[facility])    BP 122/75    Pulse (!) 51    Temp 98.3 F (36.8 C)    Ht 5\' 10"  (1.778 m)    SpO2  98%    BMI 24.82 kg/m   Opioid Risk Score:   Fall Risk Score:  `1  Depression screen PHQ 2/9  Depression screen [facility] 2/9 10/26/2021 12/08/2020  Decreased Interest 0 3  Down, Depressed, Hopeless 0 3  PHQ - 2 Score 0 6  Altered sleeping - 1  Tired, decreased energy - 1  Change in appetite - 2  Feeling bad or failure about yourself  - 3  Trouble concentrating - 0  Moving slowly or fidgety/restless - 0  Suicidal thoughts - 0  PHQ-9 Score - 13  Difficult doing work/chores - Very difficult     Review of Systems  Constitutional:  Positive for unexpected weight change.  HENT: Negative.    Eyes: Negative.   Respiratory: Negative.    Cardiovascular: Negative.   Gastrointestinal:  Positive for abdominal pain and constipation.  Endocrine: Negative.   Genitourinary:  Positive for difficulty urinating.  Musculoskeletal: Negative.   Skin: Negative.   Allergic/Immunologic: Negative.   Neurological: Negative.   Hematological: Negative.   Psychiatric/Behavioral:  Positive for dysphoric mood.       Objective:   Physical Exam  Awake alert, appropriate, in manual w/c- can stand when holding onto w/c with both hands, but not otherwise, NAD Doing pressure relief frequently.   Neuro: 5-8 beats clonus RLE No clonus LLE MAS of 3 in R knee- MAS of 2 in R hip- MAS of 2 in R ankle.  MAS of 1+ in LLE  MS:  Very TTP over R anterior/ASIS RLE_ HF 1/5; KE 2/5; DF 2+/5 and PF 2+/5 LLE- 4/5 in same muscles     Assessment & Plan:   Pt is a 56 yr old R handed male with hx of HTN who developed transverse myelitis d'xd in 7/21. With neurogenic bowel and bladder and spasticity-   S/P steroid IV and IVIG- no plasmapheresis. now on meds for BP.   Here for f/u on transverse myelitis/paraplegia. Also has spasticity of RLE and depression.   Con't tramadol- last Rx 07/13/21- hasn't gotten refilled since then- con't using as needed;  but can take 1-2x/day  (at least 1x/day) 2x/day if needed- suggest he do that. Would like him to try taking - if that's isn't effective, then can start Valium   2.  If tramadol NOT effective for pain, then try Valium 2 mg BID or 2x/day  x 5-7 days SCHEDULED- then back back off to as needed. Let me know if slightly effective (then can increase) or NOT effective at all- to try something else.   3. No more refills on Tizanidine- only taking 2 mg nightly- but needs more refills- 2-4 mg BID prn for spasticity- #60- 5 refills.   4. If this doesn't work- let me know!   5. Call [facility] and see if they have any "free time" - so can get in the pool at least 2x/week to work on strengthening.    6. Will look into restarting actual therapy in ~ 6 months- per insurance requirements that we wait for now .   7. Dealing with grief over paraplegia- parents cleaning him up.   8.Try enemeeze- Docusate mini enema- and see if can do every 1-2 days- less chance of bowel accidents if does every day. Suggest doing on toilet- expect 30 minute-s might need to do digital stimulation.   9. F/U in 3 months Double appt- SCI   10. Will let me know about peer support!   I spent  42  minutes on appointment today; more than 50% of that time was spent on educating patient as per details/plan above. Esp discussing grief and peer support and bowel program.

## 2021-10-26 NOTE — Patient Instructions (Signed)
Pt is a 56 yr old R handed male with hx of HTN who developed transverse myelitis d'xd in 7/21. With neurogenic bowel and bladder and spasticity-   S/P steroid IV and IVIG- no plasmapheresis. now on meds for BP.  Here for f/u on transverse myelitis/paraplegia. Also has spasticity of RLE and depression.   Con't tramadol- last Rx 07/13/21- hasn't gotten refilled since then- con't using as needed;  but can take 1-2x/day  (at least 1x/day) 2x/day if needed- suggest he do that. Would like him to try taking - if that's isn't effective, then can start Valium   2.  If tramadol NOT effective for pain, then try Valium 2 mg BID or 2x/day  x 5-7 days SCHEDULED- then back back off to as needed. Let me know if slightly effective (then can increase) or NOT effective at all- to try something else.   3. No more refills on Tizanidine- only taking 2 mg nightly- but needs more refills- 2-4 mg BID prn for spasticity- #60- 5 refills.   4. If this doesn't work- let me know!   5. Call [facility] and see if they have any "free time" - so can get in the pool at least 2x/week to work on strengthening.    6. Will look into restarting actual therapy in ~ 6 months- per insurance requirements that we wait for now .   7. Dealing with grief over paraplegia- will let me know about peer support! No matter what!  8.Try enemeeze- Docusate mini enema- and see if can do every 1-2 days- less chance of bowel accidents if does every day. Suggest doing on toilet- expect 30 minute-s might need to do digital stimulation.   9. F/U in 3 months Double appt- SCI

## 2021-10-29 ENCOUNTER — Telehealth: Payer: Self-pay

## 2021-11-05 NOTE — Telephone Encounter (Signed)
Message read.

## 2022-01-08 DIAGNOSIS — Z0289 Encounter for other administrative examinations: Secondary | ICD-10-CM

## 2022-01-09 ENCOUNTER — Telehealth: Payer: Self-pay | Admitting: *Deleted

## 2022-01-09 NOTE — Telephone Encounter (Signed)
Gave completed/signed [place] form back to medical records to process for pt. ?

## 2022-01-28 ENCOUNTER — Ambulatory Visit: Payer: BC Managed Care – PPO | Admitting: Physical Medicine and Rehabilitation

## 2022-02-04 ENCOUNTER — Encounter: Payer: Self-pay | Admitting: Physical Medicine and Rehabilitation

## 2022-02-04 ENCOUNTER — Encounter
Payer: BC Managed Care – PPO | Attending: Physical Medicine and Rehabilitation | Admitting: Physical Medicine and Rehabilitation

## 2022-02-04 VITALS — BP 131/78 | HR 65 | Temp 98.3°F | Ht 70.0 in | Wt 170.6 lb

## 2022-02-04 DIAGNOSIS — G8220 Paraplegia, unspecified: Secondary | ICD-10-CM | POA: Insufficient documentation

## 2022-02-04 DIAGNOSIS — G373 Acute transverse myelitis in demyelinating disease of central nervous system: Secondary | ICD-10-CM | POA: Insufficient documentation

## 2022-02-04 DIAGNOSIS — R252 Cramp and spasm: Secondary | ICD-10-CM | POA: Diagnosis present

## 2022-02-04 DIAGNOSIS — Z993 Dependence on wheelchair: Secondary | ICD-10-CM | POA: Insufficient documentation

## 2022-02-04 DIAGNOSIS — K592 Neurogenic bowel, not elsewhere classified: Secondary | ICD-10-CM | POA: Insufficient documentation

## 2022-02-04 MED ORDER — TRAMADOL HCL 50 MG PO TABS: 50.0000 mg | ORAL_TABLET | Freq: Four times a day (QID) | ORAL | 5 refills | Status: DC | PRN

## 2022-02-04 NOTE — Patient Instructions (Signed)
Pt is a 56 yr old R handed male with hx of HTN who developed transverse myelitis d'xd in 7/21. With neurogenic bowel and bladder and spasticity-   ?S/P steroid IV and IVIG- no plasmapheresis. now on meds for BP.  ?Here for f/u on transverse myelitis/paraplegia. ?Also has spasticity of RLE and depression. ? ?Discussed SCI support group.  ? ?2. Discussed L foot driving pedal- googled those exact words- to see if could drive for L foot. Strength good enough to drive with L foot.  ? ?3.  Refill Tramadol 50 mg BID prn for pain #75- 5 refills.  ? ?4. Con't Valium and Zanaflex prn.  ? ? ?5. Is being treated for H Pylori- per GI.  ? ? ?6. Discussed intimacy- has Rx for Viagra/generic-  ?Retrograde-  ? ?7. Mood is better- off Prozac- not taking.  ? ? ?8. Referral to physical therapy- [facility] [address]- call to make appt.  ? ? ?9. F/U in 3 months- double visit- SCI ?

## 2022-02-04 NOTE — Progress Notes (Addendum)
Subjective:    Patient ID: Maurice Cruz, male    DOB: [birth year], 56 y.o.   MRN: [id number]  HPI Pt is a 56 yr old R handed male with hx of HTN who developed transverse myelitis d'xd in 7/21. With neurogenic bowel and bladder and spasticity-   S/P steroid IV and IVIG- no plasmapheresis. now on meds for BP.  Here for f/u on transverse myelitis/paraplegia. Also has spasticity of RLE and depression.   Bowels- getting better- Did a CT scan- didn't see any masses-  Sent for U/S of kidneys- and everything find.  GI- has a small hernia and stomach infection- had H Pylori and was put on triple therapy.   Start taking miralax/benefiber- daily- and going better.  And moved to QOD- since having sharts.   Couldn't eat and back in 170 range- was down to 158 lbs.    Started on Trulance- 1-2x- helps him go.   Bladder- still cathing- 4-5x/day.   Pain- 2/10- across low back-  Takes tramadol prn- - has 2 pills left- but couldn't refill past April.   Had to do soul searching since last visit- and realizing that he isn't going to get back to "100%"- but feeling better about that.   Not able to drive-  Wants to know if can drive with feet?  Butt hurts- since so flat.    Had to move back here from [place].   Takes Valium for spasms well- but not when takes tramadol-  Takes Valium every night- unless aching in back and then will take tramadol.      Pain Inventory Average Pain 3 Pain Right Now 2 My pain is intermittent and aching  LOCATION OF PAIN  BACK & BUTTOCKS  BOWEL Number of stools per week: 3-4 Oral laxative use Yes  Type of laxative Fiber, Miralax, Trulance Enema or suppository use No  History of colostomy No  Incontinent No   BLADDER  In and out cath, frequency  Yes Able to self cath Yes  4-5 times daily  Difficulty starting stream Yes  Incomplete bladder emptying Yes    Mobility use a walker how many minutes can you walk? 5-10 minutes ability to climb  steps?  no do you drive?  no use a wheelchair transfers alone Do you have any goals in this area?  yes  Function disabled: date disabled 04/13/2020 I need assistance with the following:  meal prep, household duties, and shopping Do you have any goals in this area?  yes  Neuro/Psych bladder control problems bowel control problems weakness trouble walking spasms  Prior Studies Any changes since last visit?  yes CT/MRI with [facility]  Physicians involved in your care Any changes since last visit?  no   Family History  Problem Relation Age of Onset   Cancer Maternal Grandmother    Social History   Socioeconomic History   Marital status: Single    Spouse name: Not on file   Number of children: 0   Years of education: HS   Highest education level: Not on file  Occupational History   Occupation: On disability  Tobacco Use   Smoking status: Never   Smokeless tobacco: Never  Vaping Use   Vaping Use: Never used  Substance and Sexual Activity   Alcohol use: Not Currently   Drug use: Never   Sexual activity: Not Currently  Other Topics Concern   Not on file  Social History Narrative   Right handed   1 glass  tea per day   Coffee sometimes    No Soda   Lives with parents.   Social Determinants of Health   Financial Resource Strain: Not on file  Food Insecurity: Not on file  Transportation Needs: Not on file  Physical Activity: Not on file  Stress: Not on file  Social Connections: Not on file   Past Surgical History:  Procedure Laterality Date   Anorectal biospy  02/09/2021   Colon Biospy  02/09/2021   IR ANGIO INTRA EXTRACRAN SEL COM CAROTID INNOMINATE BILAT MOD SED  04/25/2021   IR ANGIO VERTEBRAL SEL SUBCLAVIAN INNOMINATE UNI L MOD SED  04/25/2021   IR ANGIO VERTEBRAL SEL VERTEBRAL UNI R MOD SED  04/25/2021   IR ANGIO/SPINAL LEFT  04/25/2021   IR ANGIO/SPINAL LEFT  04/25/2021   IR ANGIO/SPINAL LEFT  04/25/2021   IR ANGIO/SPINAL LEFT  04/25/2021   IR  ANGIO/SPINAL LEFT  04/25/2021   IR ANGIO/SPINAL LEFT  04/25/2021   IR ANGIO/SPINAL LEFT  04/25/2021   IR ANGIO/SPINAL LEFT  04/25/2021   IR ANGIO/SPINAL LEFT  04/25/2021   IR ANGIO/SPINAL LEFT  04/25/2021   IR ANGIO/SPINAL LEFT  04/25/2021   IR ANGIO/SPINAL LEFT  04/25/2021   IR ANGIO/SPINAL LEFT  04/25/2021   IR ANGIO/SPINAL RIGHT  04/25/2021   IR ANGIO/SPINAL RIGHT  04/25/2021   IR ANGIO/SPINAL RIGHT  04/25/2021   IR ANGIO/SPINAL RIGHT  04/25/2021   IR ANGIO/SPINAL RIGHT  04/25/2021   IR ANGIO/SPINAL RIGHT  04/25/2021   IR ANGIO/SPINAL RIGHT  04/25/2021   IR ANGIO/SPINAL RIGHT  04/25/2021   IR ANGIO/SPINAL RIGHT  04/25/2021   IR ANGIO/SPINAL RIGHT  04/25/2021   IR ANGIO/SPINAL RIGHT  04/25/2021   IR ANGIO/SPINAL RIGHT  04/25/2021   IR ANGIO/SPINAL RIGHT  04/25/2021   IR ANGIO/SPINAL RIGHT  04/25/2021   IR ANGIOGRAM EXTREMITY BILATERAL  04/25/2021   IR RADIOLOGIST EVAL & MGMT  03/21/2021   RADIOLOGY WITH ANESTHESIA N/A 04/25/2021   Procedure: IR WITH ANESTHESIA SPINAL ANGIOGRAM;  Surgeon: Luanne Bras, MD;  Location: [facility];  Service: Radiology;  Laterality: N/A;   Past Medical History:  Diagnosis Date   Anxiety    Asthma    as a child   Depression    ED (erectile dysfunction)    GERD (gastroesophageal reflux disease)    Hx of spinal cord injury 03/2020   Hypertension    Neurogenic bladder    self caths   Neurogenic bowel    has to be digitially stimulated - 04/24/21   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[facility])    BP 131/78   Pulse 65   Temp 98.3 F (36.8 C)   Ht 5\' 10"  (1.778 m)   Wt 170 lb 9.6 oz (77.4 kg)   SpO2 97%   BMI 24.48 kg/m   Opioid Risk Score:   Fall Risk Score:  `1  Depression screen [facility] 2/9     02/04/2022    2:32 PM 10/26/2021    1:39 PM 12/08/2020   10:17 AM  Depression screen PHQ 2/9  Decreased Interest 0 0 3  Down, Depressed, Hopeless 0 0 3  PHQ - 2 Score 0 0 6  Altered sleeping   1  Tired, decreased energy   1  Change in appetite    2  Feeling bad or failure about yourself    3  Trouble concentrating   0  Moving slowly or fidgety/restless   0  Suicidal  thoughts   0  PHQ-9 Score   13  Difficult doing work/chores   Very difficult    Review of Systems  Gastrointestinal:  Positive for abdominal pain and constipation.  Musculoskeletal:  Positive for gait problem.  Neurological:  Positive for weakness.       Spasms  All other systems reviewed and are negative.     Objective:   Physical Exam  Awake, alert, brighter affect; in manual w/c, NAD  MS:  RLE HF 2-/5; KE 3+/5; DF 3-/5; PF 2/5 LLE- HF 4+/5; KE 4+/5; DF/PF 4+/5  Neuro: A few beats clonus RLE- MAS of 2 in RLE      Assessment & Plan:   Pt is a 56 yr old R handed male with hx of HTN who developed transverse myelitis d'xd in 7/21. With neurogenic bowel and bladder and spasticity-   S/P steroid IV and IVIG- no plasmapheresis. now on meds for BP.  Here for f/u on transverse myelitis/paraplegia. Also has spasticity of RLE and depression.  Discussed SCI support group.   2. Discussed L foot driving pedal- googled those exact words- to see if could drive for L foot. Strength good enough to drive with L foot.   3.  Refill Tramadol 50 mg BID prn for pain #75- 5 refills.   4. Con't Valium and Zanaflex prn.    5. Is being treated for H Pylori- per GI.    6. Discussed intimacy- has Rx for Viagra/generic-  Retrograde-   7. Mood is better- off Prozac- not taking.    8. Referral to physical therapy- [facility] [address]- call to make appt.    9. F/U in 3 months- double visit- SCI  I spent a total of  36  minutes on total care today- >50% coordination of care- due to discussion of intimacy- and driving and therapy as well as spasticity   Addendum: Pt needs a custom AFO to work on gait/walking- see if he's able to- without an AFO, there's no way he would be able to stand or walk. Will send to [name] to be addressed.

## 2022-02-08 ENCOUNTER — Ambulatory Visit: Payer: BC Managed Care – PPO | Attending: Physical Medicine and Rehabilitation

## 2022-02-08 DIAGNOSIS — R2689 Other abnormalities of gait and mobility: Secondary | ICD-10-CM | POA: Diagnosis present

## 2022-02-08 DIAGNOSIS — M6281 Muscle weakness (generalized): Secondary | ICD-10-CM | POA: Diagnosis present

## 2022-02-08 DIAGNOSIS — R262 Difficulty in walking, not elsewhere classified: Secondary | ICD-10-CM | POA: Insufficient documentation

## 2022-02-08 DIAGNOSIS — R2681 Unsteadiness on feet: Secondary | ICD-10-CM | POA: Insufficient documentation

## 2022-02-08 DIAGNOSIS — G373 Acute transverse myelitis in demyelinating disease of central nervous system: Secondary | ICD-10-CM | POA: Insufficient documentation

## 2022-02-08 DIAGNOSIS — G8220 Paraplegia, unspecified: Secondary | ICD-10-CM | POA: Insufficient documentation

## 2022-02-08 NOTE — Therapy (Signed)
?OUTPATIENT PHYSICAL THERAPY NEURO EVALUATION ? ? ?Patient Name: Maurice Cruz ?MRN: [id number] ?DOB:[birth year], 56 y.o., male ?Today's Date: 02/08/2022 ? ?PCP: Dr. Glendon Axe ?REFERRING PROVIDER: Courtney Heys, MD  ? ? PT End of Session - 02/08/22 1219   ? ? Visit Number 1   ? Number of Visits 17   ? Date for PT Re-Evaluation 04/05/22   ? Authorization Type [employer] (60 visit limit annually)   ? PT Start Time 1100   ? PT Stop Time 1145   ? PT Time Calculation (min) 45 min   ? Equipment Utilized During Treatment Gait belt   ? Activity Tolerance Patient tolerated treatment well   ? Behavior During Therapy [facility] for tasks assessed/performed   ? ?  ?  ? ?  ? ? ?Past Medical History:  ?Diagnosis Date  ? Anxiety   ? Asthma   ? as a child  ? Depression   ? ED (erectile dysfunction)   ? GERD (gastroesophageal reflux disease)   ? Hx of spinal cord injury 03/2020  ? Hypertension   ? Neurogenic bladder   ? self caths  ? Neurogenic bowel   ? has to be digitially stimulated - 04/24/21  ? 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[place])   ? bilateral legs  ? 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[place])   ? ?Past Surgical History:  ?Procedure Laterality Date  ? Anorectal biospy  02/09/2021  ? Colon Biospy  02/09/2021  ? IR ANGIO INTRA EXTRACRAN SEL COM CAROTID INNOMINATE BILAT MOD SED  04/25/2021  ? IR ANGIO VERTEBRAL SEL SUBCLAVIAN INNOMINATE UNI L MOD SED  04/25/2021  ? IR ANGIO VERTEBRAL SEL VERTEBRAL UNI R MOD SED  04/25/2021  ? IR ANGIO/SPINAL LEFT  04/25/2021  ? IR ANGIO/SPINAL LEFT  04/25/2021  ? IR ANGIO/SPINAL LEFT  04/25/2021  ? IR ANGIO/SPINAL LEFT  04/25/2021  ? IR ANGIO/SPINAL LEFT  04/25/2021  ? IR ANGIO/SPINAL LEFT  04/25/2021  ? IR ANGIO/SPINAL LEFT  04/25/2021  ? IR ANGIO/SPINAL LEFT  04/25/2021  ? IR ANGIO/SPINAL LEFT  04/25/2021  ? IR ANGIO/SPINAL LEFT  04/25/2021  ? IR ANGIO/SPINAL LEFT  04/25/2021  ? IR ANGIO/SPINAL LEFT  04/25/2021  ? IR ANGIO/SPINAL LEFT  04/25/2021  ? IR ANGIO/SPINAL RIGHT  04/25/2021  ? IR ANGIO/SPINAL RIGHT  04/25/2021  ? IR ANGIO/SPINAL RIGHT  04/25/2021  ? IR  ANGIO/SPINAL RIGHT  04/25/2021  ? IR ANGIO/SPINAL RIGHT  04/25/2021  ? IR ANGIO/SPINAL RIGHT  04/25/2021  ? IR ANGIO/SPINAL RIGHT  04/25/2021  ? IR ANGIO/SPINAL RIGHT  04/25/2021  ? IR ANGIO/SPINAL RIGHT  04/25/2021  ? IR ANGIO/SPINAL RIGHT  04/25/2021  ? IR ANGIO/SPINAL RIGHT  04/25/2021  ? IR ANGIO/SPINAL RIGHT  04/25/2021  ? IR ANGIO/SPINAL RIGHT  04/25/2021  ? IR ANGIO/SPINAL RIGHT  04/25/2021  ? IR ANGIOGRAM EXTREMITY BILATERAL  04/25/2021  ? IR RADIOLOGIST EVAL & MGMT  03/21/2021  ? RADIOLOGY WITH ANESTHESIA N/A 04/25/2021  ? Procedure: IR WITH ANESTHESIA SPINAL ANGIOGRAM;  Surgeon: Luanne Bras, MD;  Location: [place];  Service: Radiology;  Laterality: N/A;  ? ?Patient Active Problem List  ? Diagnosis Date Noted  ? Disease of spinal cord ([place]) 05/21/2021  ? 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 Weakness of both lower extremities 02/05/2021  ? 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[place]) 12/08/2020  ? Neurogenic bladder 12/08/2020  ? Neurogenic bowel 12/08/2020  ? Paraplegia following spinal cord injury ([place]) 12/08/2020  ? Wheelchair dependence 12/08/2020  ? Spasticity 12/08/2020  ? Chronic bilateral thoracic back pain 12/08/2020  ? ? ?  ONSET DATE: 02/04/22 (date of referral) ? ?REFERRING DIAG: G37.3 (ICD-10-CM) - 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HCC) G82.20 (ICD-10-CM) - Paraplegia following spinal cord injury ([place])  ? ?THERAPY DIAG:  ?Other abnormalities of gait and mobility ? ?Muscle weakness (generalized) ? ?Difficulty in walking, not elsewhere classified ? ?Unsteadiness on feet ? ?SUBJECTIVE:  ?                                                                                                                                                                                           ? ?SUBJECTIVE STATEMENT: ?hx of HTN who developed transverse myelitis d'xd in 7/21. With neurogenic bowel and bladder and spasticity. They tried IVIG and steroids but patient didn't feel any change from it with his symptoms (this was after his diagnosis) ?Pt accompanied  by:  father ? ?PERTINENT HISTORY: Transverse myelitis, neurogenic bowel and bladder ? ?PAIN:  ?Are you having pain? Yes: NPRS scale: 2/10 ?Pain location: back ?Pain description: dull ?Aggravating factors: standing/walking,prolonged sitting, and lying ?Relieving factors: reposition rest ? ?PRECAUTIONS: None ? ?WEIGHT BEARING RESTRICTIONS No ? ?FALLS: Has patient fallen in last 6 months? No ? ?LIVING ENVIRONMENT: ?Lives with:  mother and father ?Lives in: House/apartment ?Stairs: Yes: Internal: 12 steps; on right going up and on left going up and External: 1 steps; none, pt has chair lift at home for stairs. ?Has following equipment at home: Gilford Rile - 2 wheeled, Wheelchair (manual), and bed side commode ? ?PLOF:  Independent with dressing, self care, transfer, parents help with cooking, cleaning. ? ?PATIENT GOALS Improve strength in R leg, improve walking ? ?OBJECTIVE:  ? ? ?COGNITION: ?Overall cognitive status: Within functional limits for tasks assessed ?  ? ? ?LE ROM:    ? ?Active  Right ?02/08/2022 Left ?02/08/2022  ?Hip flexion    ?Hip extension    ?Hip abduction    ?Hip adduction    ?Hip internal rotation    ?Hip external rotation    ?Knee flexion    ?Knee extension    ?Ankle dorsiflexion    ?Ankle plantarflexion    ?Ankle inversion    ?Ankle eversion    ? (Blank rows = not tested) ? ?MMT:   ? ?MMT Right ?02/08/2022 Left ?02/08/2022  ?Hip flexion 1+ 3+  ?Hip extension    ?Hip abduction 1+ 3+  ?Hip adduction 1+ 3+  ?Hip internal rotation    ?Hip external rotation    ?Knee flexion 1+ 4  ?Knee extension 1+ 4  ?Ankle dorsiflexion 1+ 3+  ?Ankle plantarflexion 1+ 3+  ?Ankle inversion    ?Ankle eversion    ?(Blank rows = not tested) ? ?Pt uses  manual wheelchair when he goes to MD appts or stores. Inside the house he uses walker for household mobility. ? ? ? ?FUNCTIONAL TESTs:  ?5 times sit to stand: 47 sec ?Timed up and go (TUG): 1 min with RW ?10 meter walk test: 0.27 m/s with RW ? ? ? ?TODAY'S TREATMENT:  ?Trial of AFO  with toe kick (used shoe cover) with gait 1 x 40 feet with RW and CGA, noted improved foot clearance and step length  ? ? ?PATIENT EDUCATION: ?Education details: see above ?Person educated: Patient ?Education method: Explanation ?Education comprehension: verbalized understanding ? ? ?HOME EXERCISE PROGRAM: ?TBD ? ? ? ?GOALS: ?Goals reviewed with patient? Yes ? ?SHORT TERM GOALS: Target date: 03/08/2022   (Remove Blue Hyperlink) ? ?Patient will be able to ambulate 500' with RW without needing a rest break to improve functional walking endurance. ?Baseline:20 meters ?Goal status: INITIAL ? ?2.  Patient will get a shower bench to improve his tub transfers while reducing risk for fall and reduce strain on his shoulders while getting in and out of the car.  ?Baseline: takes shower in tub ?Goal status: INITIAL ? ?3.  Pt will be able to tolerate stand/walk activities or 10 min ?Baseline: 5 min ?Goal status: INITIAL ? ?LONG TERM GOALS: Target date: 04/05/2022   (Remove Blue Hyperlink) ? ?Patient will demo gait speed with walker of at least 0.75m/s to improve his functional ambulation status to limited community ambulator ?Baseline: 0.36m/s with RW (02/08/22) ?Goal status: INITIAL ? ?2.  Pt will demo TUG score of <40 sec with RW to improve functional mobility ?Baseline: 1 min (02/08/22) ?Goal status: INITIAL ? ?3.  Patient will be able to perform 5x sit to stand with use of bil UE support in <35 seconds to improve functional strength. ?Baseline: 47 seconds (01/29/22) ?Goal status: INITIAL ? ?4 ?ASSESSMENT: ? ?CLINICAL IMPRESSION: ?Patient is a 56 y.o. male who was seen today for physical therapy evaluation and treatment for gait and mobility disorder resulting from transverse myelitis.  ? ? ?OBJECTIVE IMPAIRMENTS Abnormal gait, decreased activity tolerance, decreased balance, decreased endurance, decreased mobility, difficulty walking, decreased ROM, decreased strength, hypomobility, increased fascial restrictions, impaired  flexibility, impaired sensation, impaired tone, improper body mechanics, postural dysfunction, and pain.  ? ?ACTIVITY LIMITATIONS cleaning, community activity, driving, meal prep, laundry, and yard work.  ? ?PE

## 2022-02-18 ENCOUNTER — Other Ambulatory Visit: Payer: Self-pay | Admitting: Physical Medicine and Rehabilitation

## 2022-02-19 NOTE — Therapy (Incomplete)
OUTPATIENT PHYSICAL THERAPY TREATMENT NOTE   Patient Name: Maurice Cruz MRN: [id number] DOB:[birth year], 56 y.o., male Today's Date: 02/19/2022  PCP: Dr. Glendon Axe REFERRING PROVIDER: Courtney Heys, MD      Past Medical History:  Diagnosis Date   Anxiety    Asthma    as a child   Depression    ED (erectile dysfunction)    GERD (gastroesophageal reflux disease)    Hx of spinal cord injury 03/2020   Hypertension    Neurogenic bladder    self caths   Neurogenic bowel    has to be digitially stimulated - 04/24/21   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[place])    bilateral legs   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[place])    Past Surgical History:  Procedure Laterality Date   Anorectal biospy  02/09/2021   Colon Biospy  02/09/2021   IR ANGIO INTRA EXTRACRAN SEL COM CAROTID INNOMINATE BILAT MOD SED  04/25/2021   IR ANGIO VERTEBRAL SEL SUBCLAVIAN INNOMINATE UNI L MOD SED  04/25/2021   IR ANGIO VERTEBRAL SEL VERTEBRAL UNI R MOD SED  04/25/2021   IR ANGIO/SPINAL LEFT  04/25/2021   IR ANGIO/SPINAL LEFT  04/25/2021   IR ANGIO/SPINAL LEFT  04/25/2021   IR ANGIO/SPINAL LEFT  04/25/2021   IR ANGIO/SPINAL LEFT  04/25/2021   IR ANGIO/SPINAL LEFT  04/25/2021   IR ANGIO/SPINAL LEFT  04/25/2021   IR ANGIO/SPINAL LEFT  04/25/2021   IR ANGIO/SPINAL LEFT  04/25/2021   IR ANGIO/SPINAL LEFT  04/25/2021   IR ANGIO/SPINAL LEFT  04/25/2021   IR ANGIO/SPINAL LEFT  04/25/2021   IR ANGIO/SPINAL LEFT  04/25/2021   IR ANGIO/SPINAL RIGHT  04/25/2021   IR ANGIO/SPINAL RIGHT  04/25/2021   IR ANGIO/SPINAL RIGHT  04/25/2021   IR ANGIO/SPINAL RIGHT  04/25/2021   IR ANGIO/SPINAL RIGHT  04/25/2021   IR ANGIO/SPINAL RIGHT  04/25/2021   IR ANGIO/SPINAL RIGHT  04/25/2021   IR ANGIO/SPINAL RIGHT  04/25/2021   IR ANGIO/SPINAL RIGHT  04/25/2021   IR ANGIO/SPINAL RIGHT  04/25/2021   IR ANGIO/SPINAL RIGHT  04/25/2021   IR ANGIO/SPINAL RIGHT  04/25/2021   IR ANGIO/SPINAL RIGHT  04/25/2021   IR ANGIO/SPINAL RIGHT  04/25/2021   IR ANGIOGRAM EXTREMITY BILATERAL  04/25/2021    IR RADIOLOGIST EVAL & MGMT  03/21/2021   RADIOLOGY WITH ANESTHESIA N/A 04/25/2021   Procedure: IR WITH ANESTHESIA SPINAL ANGIOGRAM;  Surgeon: Luanne Bras, MD;  Location: [place];  Service: Radiology;  Laterality: N/A;   Patient Active Problem List   Diagnosis Date Noted   Disease of spinal cord ([place]) 05/21/2021   Anti-RNP antibodies present 05/21/2021   Weakness of both lower extremities 02/05/2021   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[place]) 12/08/2020   Neurogenic bladder 12/08/2020   Neurogenic bowel 12/08/2020   Paraplegia following spinal cord injury ([place]) 12/08/2020   Wheelchair dependence 12/08/2020   Spasticity 12/08/2020   Chronic bilateral thoracic back pain 12/08/2020    ONSET DATE: 02/04/22 (date of referral)  REFERRING DIAG: G37.3 (ICD-10-CM) - 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[place]) G82.20 (ICD-10-CM) - Paraplegia following spinal cord injury ([place])   THERAPY DIAG:  No diagnosis found.  SUBJECTIVE:  SUBJECTIVE STATEMENT: *** Pt accompanied by:  father  PERTINENT HISTORY: Transverse myelitis, neurogenic bowel and bladder  PAIN:  PAIN:  Are you having pain? Yes: {yespain:[id number]::"NPRS scale: ***/10","Pain location: ***","Pain description: ***","Aggravating factors: ***","Relieving factors: ***"}   PRECAUTIONS: None  WEIGHT BEARING RESTRICTIONS No  FALLS: Has patient fallen in last 6 months? No  LIVING ENVIRONMENT: Lives with:  mother and father Lives in: House/apartment Stairs: Yes: Internal: 12 steps; on right going up and on left going up and External: 1 steps; none, pt has chair lift at home for stairs. Has following equipment at home: Gilford Rile - 2 wheeled, Wheelchair (manual), and bed side commode  PLOF:  Independent with dressing, self care, transfer, parents help with cooking,  cleaning.  PATIENT GOALS Improve strength in R leg, improve walking  OBJECTIVE:  LE ROM:     Active  Right 02/19/2022 Left 02/19/2022  Hip flexion    Hip extension    Hip abduction    Hip adduction    Hip internal rotation    Hip external rotation    Knee flexion    Knee extension    Ankle dorsiflexion    Ankle plantarflexion    Ankle inversion    Ankle eversion     (Blank rows = not tested)  MMT:    MMT Right 02/19/2022 Left 02/19/2022  Hip flexion 1+ 3+  Hip extension    Hip abduction 1+ 3+  Hip adduction 1+ 3+  Hip internal rotation    Hip external rotation    Knee flexion 1+ 4  Knee extension 1+ 4  Ankle dorsiflexion 1+ 3+  Ankle plantarflexion 1+ 3+  Ankle inversion    Ankle eversion    (Blank rows = not tested)  Pt uses manual wheelchair when he goes to MD appts or stores. Inside the house he uses walker for household mobility.    FUNCTIONAL TESTs: Performed on the Eval 5 times sit to stand: 47 sec Timed up and go (TUG): 1 min with RW 10 meter walk test: 0.27 m/s with RW    TODAY'S TREATMENT:   02/20/22 *** Evaluation:  Trial of AFO with toe kick (used shoe cover) with gait 1 x 40 feet with RW and CGA, noted improved foot clearance and step length    PATIENT EDUCATION: Education details: see above Person educated: Patient Education method: Explanation Education comprehension: verbalized understanding   HOME EXERCISE PROGRAM: TBD    GOALS: Goals reviewed with patient? Yes  SHORT TERM GOALS: Target date: 03/08/2022     Patient will be able to ambulate 500' with RW without needing a rest break to improve functional walking endurance. Baseline:20 meters Goal status: INITIAL  2.  Patient will get a shower bench to improve his tub transfers while reducing risk for fall and reduce strain on his shoulders while getting in and out of the car.  Baseline: takes shower in tub Goal status: INITIAL  3.  Pt will be able to tolerate stand/walk  activities or 10 min Baseline: 5 min Goal status: INITIAL  LONG TERM GOALS: Target date: 04/05/2022   (Remove Blue Hyperlink)  Patient will demo gait speed with walker of at least 0.57m/s to improve his functional ambulation status to limited community ambulator Baseline: 0.60m/s with RW (02/08/22) Goal status: INITIAL  2.  Pt will demo TUG score of <40 sec with RW to improve functional mobility Baseline: 1 min (02/08/22) Goal status: INITIAL  3.  Patient will be able to perform 5x sit to  stand with use of bil UE support in <35 seconds to improve functional strength. Baseline: 47 seconds (01/29/22) Goal status: INITIAL   ASSESSMENT:  CLINICAL IMPRESSION: ***   OBJECTIVE IMPAIRMENTS Abnormal gait, decreased activity tolerance, decreased balance, decreased endurance, decreased mobility, difficulty walking, decreased ROM, decreased strength, hypomobility, increased fascial restrictions, impaired flexibility, impaired sensation, impaired tone, improper body mechanics, postural dysfunction, and pain.   ACTIVITY LIMITATIONS cleaning, community activity, driving, meal prep, laundry, and yard work.   PERSONAL FACTORS Age, Past/current experiences, and Time since onset of injury/illness/exacerbation are also affecting patient's functional outcome.    REHAB POTENTIAL: Good  CLINICAL DECISION MAKING: Stable/uncomplicated  EVALUATION COMPLEXITY: Low  PLAN: PT FREQUENCY: 2x/week  PT DURATION: 8 weeks  PLANNED INTERVENTIONS: Therapeutic exercises, Therapeutic activity, Neuromuscular re-education, Balance training, Gait training, Patient/Family education, Joint mobilization, Stair training, Orthotic/Fit training, Aquatic Therapy, Spinal mobilization, Cryotherapy, Moist heat, and Manual therapy  PLAN FOR NEXT SESSION: Issue HEP, Try quadruped work for core control, work on standing balance, try AFO on R LE with toekick (shoe covers) with gait training.    Kerrie Pleasure, PT 02/19/2022,  10:16 AM

## 2022-02-20 ENCOUNTER — Ambulatory Visit: Payer: BC Managed Care – PPO

## 2022-02-20 DIAGNOSIS — R262 Difficulty in walking, not elsewhere classified: Secondary | ICD-10-CM

## 2022-02-20 DIAGNOSIS — R2689 Other abnormalities of gait and mobility: Secondary | ICD-10-CM

## 2022-02-20 DIAGNOSIS — M6281 Muscle weakness (generalized): Secondary | ICD-10-CM

## 2022-02-20 DIAGNOSIS — R2681 Unsteadiness on feet: Secondary | ICD-10-CM

## 2022-02-20 NOTE — Therapy (Signed)
OUTPATIENT PHYSICAL THERAPY TREATMENT NOTE   Patient Name: Maurice Cruz MRN: [id number] DOB:[birth year], 56 y.o., male Today's Date: 02/20/2022  PCP: Dr. Glendon Axe REFERRING PROVIDER: Courtney Heys, MD    PT End of Session - 02/20/22 1543     Visit Number 2    Number of Visits 17    Date for PT Re-Evaluation 04/05/22    Authorization Type BC[employer] (60 visit limit annually)    PT Start Time 1500    PT Stop Time 53    PT Time Calculation (min) 40 min    Equipment Utilized During Treatment Gait belt    Activity Tolerance Patient tolerated treatment well    Behavior During Therapy WFL for tasks assessed/performed              Past Medical History:  Diagnosis Date   Anxiety    Asthma    as a child   Depression    ED (erectile dysfunction)    GERD (gastroesophageal reflux disease)    Hx of spinal cord injury 03/2020   Hypertension    Neurogenic bladder    self caths   Neurogenic bowel    has to be digitially stimulated - 04/24/21   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[place])    bilateral legs   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[place])    Past Surgical History:  Procedure Laterality Date   Anorectal biospy  02/09/2021   Colon Biospy  02/09/2021   IR ANGIO INTRA EXTRACRAN SEL COM CAROTID INNOMINATE BILAT MOD SED  04/25/2021   IR ANGIO VERTEBRAL SEL SUBCLAVIAN INNOMINATE UNI L MOD SED  04/25/2021   IR ANGIO VERTEBRAL SEL VERTEBRAL UNI R MOD SED  04/25/2021   IR ANGIO/SPINAL LEFT  04/25/2021   IR ANGIO/SPINAL LEFT  04/25/2021   IR ANGIO/SPINAL LEFT  04/25/2021   IR ANGIO/SPINAL LEFT  04/25/2021   IR ANGIO/SPINAL LEFT  04/25/2021   IR ANGIO/SPINAL LEFT  04/25/2021   IR ANGIO/SPINAL LEFT  04/25/2021   IR ANGIO/SPINAL LEFT  04/25/2021   IR ANGIO/SPINAL LEFT  04/25/2021   IR ANGIO/SPINAL LEFT  04/25/2021   IR ANGIO/SPINAL LEFT  04/25/2021   IR ANGIO/SPINAL LEFT  04/25/2021   IR ANGIO/SPINAL LEFT  04/25/2021   IR ANGIO/SPINAL RIGHT  04/25/2021   IR ANGIO/SPINAL RIGHT  04/25/2021   IR ANGIO/SPINAL RIGHT  04/25/2021   IR  ANGIO/SPINAL RIGHT  04/25/2021   IR ANGIO/SPINAL RIGHT  04/25/2021   IR ANGIO/SPINAL RIGHT  04/25/2021   IR ANGIO/SPINAL RIGHT  04/25/2021   IR ANGIO/SPINAL RIGHT  04/25/2021   IR ANGIO/SPINAL RIGHT  04/25/2021   IR ANGIO/SPINAL RIGHT  04/25/2021   IR ANGIO/SPINAL RIGHT  04/25/2021   IR ANGIO/SPINAL RIGHT  04/25/2021   IR ANGIO/SPINAL RIGHT  04/25/2021   IR ANGIO/SPINAL RIGHT  04/25/2021   IR ANGIOGRAM EXTREMITY BILATERAL  04/25/2021   IR RADIOLOGIST EVAL & MGMT  03/21/2021   RADIOLOGY WITH ANESTHESIA N/A 04/25/2021   Procedure: IR WITH ANESTHESIA SPINAL ANGIOGRAM;  Surgeon: Luanne Bras, MD;  Location: [facility];  Service: Radiology;  Laterality: N/A;   Patient Active Problem List   Diagnosis Date Noted   Disease of spinal cord ([place]) 05/21/2021   Anti-RNP antibodies present 05/21/2021   Weakness of both lower extremities 02/05/2021   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   Neurogenic bladder 12/08/2020   Neurogenic bowel 12/08/2020   Paraplegia following spinal cord injury ([place]) 12/08/2020   Wheelchair dependence 12/08/2020   Spasticity 12/08/2020   Chronic bilateral thoracic back pain 12/08/2020  ONSET DATE: 02/04/22 (date of referral)  REFERRING DIAG: G37.3 (ICD-10-CM) - 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[place]) G82.20 (ICD-10-CM) - Paraplegia following spinal cord injury ([place])   THERAPY DIAG:  Other abnormalities of gait and mobility  Muscle weakness (generalized)  Difficulty in walking, not elsewhere classified  Unsteadiness on feet  SUBJECTIVE:                                                                                                                                                                                              SUBJECTIVE STATEMENT: hx of HTN who developed transverse myelitis d'xd in 7/21. With neurogenic bowel and bladder and spasticity. They tried IVIG and steroids but patient didn't feel any change from it with his symptoms (this was after his diagnosis) Pt accompanied  by:  father  PERTINENT HISTORY: Transverse myelitis, neurogenic bowel and bladder  PAIN:  Are you having pain? Yes: NPRS scale: 2/10 Pain location: back Pain description: dull Aggravating factors: standing/walking,prolonged sitting, and lying Relieving factors: reposition rest  PRECAUTIONS: None  WEIGHT BEARING RESTRICTIONS No  FALLS: Has patient fallen in last 6 months? No  LIVING ENVIRONMENT: Lives with:  mother and father Lives in: House/apartment Stairs: Yes: Internal: 12 steps; on right going up and on left going up and External: 1 steps; none, pt has chair lift at home for stairs. Has following equipment at home: Gilford Rile - 2 wheeled, Wheelchair (manual), and bed side commode  PLOF:  Independent with dressing, self care, transfer, parents help with cooking, cleaning.  PATIENT GOALS Improve strength in R leg, improve walking  OBJECTIVE:     LE ROM:     Active  Right EVAL Left EVAL  Hip flexion    Hip extension    Hip abduction    Hip adduction    Hip internal rotation    Hip external rotation    Knee flexion    Knee extension    Ankle dorsiflexion    Ankle plantarflexion    Ankle inversion    Ankle eversion     (Blank rows = not tested)  MMT:    MMT Right EAVL Left EVAL  Hip flexion 1+ 3+  Hip extension    Hip abduction 1+ 3+  Hip adduction 1+ 3+  Hip internal rotation    Hip external rotation    Knee flexion 1+ 4  Knee extension 1+ 4  Ankle dorsiflexion 1+ 3+  Ankle plantarflexion 1+ 3+  Ankle inversion    Ankle eversion    (Blank rows = not tested)  Pt uses manual wheelchair when he goes to MD appts or stores. Inside the house  he uses walker for household mobility.    FUNCTIONAL TESTs:  5 times sit to stand: 47 sec Timed up and go (TUG): 1 min with RW 10 meter walk test: 0.27 m/s with RW    TODAY'S TREATMENT:  Standing unsupported: vertical head turns and horizontal head turns: 10x  Side stepping at countertop; bil UE support: 2 x  10 feet, cues for pelvis lift on R to improve foot clearance. Standing unsupported with head and shoulder twists: cues to lock contralateral knee as pt shifts weight on it to improve stabilitY: 10x R and L Seated on stool: alternating stool push and pulls with manual resistance provided by PT: 2 x 10 Chair to bed transfer: scoot, SBA Sit to supine to prone to quadruped transfer: SBA Q-ped: alternating UE lifts: 10x R and L Q-ped side step and back: 10x R and L Crawling on Q-ped: 5 feet 3x  Q-ped knee kicks to ball under her stomach: 10x  Rand L Gait training: 1 x 115' with RW with cues to hike hip during swing phase to improve closed chain glut med activation on contralalteral side.   PATIENT EDUCATION: Education details: see above Person educated: Patient Education method: Explanation Education comprehension: verbalized understanding   HOME EXERCISE PROGRAM: Access Code: [email or web address] URL: [email or web address] Date: 02/20/2022 Prepared by: Markus Jarvis  Exercises - Side Stepping with Counter Support  - 1 x daily - 7 x weekly - 2 sets - 10 reps - Sit to Stand with Counter Support  - 1 x daily - 7 x weekly - 10 reps    GOALS: Goals reviewed with patient? Yes  SHORT TERM GOALS: Target date: 03/20/2022   (Remove Blue Hyperlink)  Patient will be able to ambulate 500' with RW without needing a rest break to improve functional walking endurance. Baseline:20 meters, 115' with RW (02/20/22) Goal status: INITIAL  2.  Patient will get a shower bench to improve his tub transfers while reducing risk for fall and reduce strain on his shoulders while getting in and out of the car.  Baseline: takes shower in tub Goal status: INITIAL  3.  Pt will be able to tolerate stand/walk activities or 10 min Baseline: 5 min Goal status: INITIAL  LONG TERM GOALS: Target date: 04/17/2022   (Remove Blue Hyperlink)  Patient will demo gait speed with walker of at least 0.63m/s to improve his  functional ambulation status to limited community ambulator Baseline: 0.75m/s with RW (02/08/22) Goal status: INITIAL  2.  Pt will demo TUG score of <40 sec with RW to improve functional mobility Baseline: 1 min (02/08/22) Goal status: INITIAL  3.  Patient will be able to perform 5x sit to stand with use of bil UE support in <35 seconds to improve functional strength. Baseline: 47 seconds (01/29/22) Goal status: INITIAL  4 ASSESSMENT:  CLINICAL IMPRESSION: Today's session was focused on core and proximal hip activation with WB positions. Patient demonstrated improved foot clearance with thinking about "hip hike" on ipsilateral side.   OBJECTIVE IMPAIRMENTS Abnormal gait, decreased activity tolerance, decreased balance, decreased endurance, decreased mobility, difficulty walking, decreased ROM, decreased strength, hypomobility, increased fascial restrictions, impaired flexibility, impaired sensation, impaired tone, improper body mechanics, postural dysfunction, and pain.   ACTIVITY LIMITATIONS cleaning, community activity, driving, meal prep, laundry, and yard work.   PERSONAL FACTORS Age, Past/current experiences, and Time since onset of injury/illness/exacerbation are also affecting patient's functional outcome.    REHAB POTENTIAL: Good  CLINICAL DECISION MAKING: Stable/uncomplicated  EVALUATION COMPLEXITY: Low  PLAN: PT FREQUENCY: 2x/week  PT DURATION: 8 weeks  PLANNED INTERVENTIONS: Therapeutic exercises, Therapeutic activity, Neuromuscular re-education, Balance training, Gait training, Patient/Family education, Joint mobilization, Stair training, Orthotic/Fit training, Aquatic Therapy, Spinal mobilization, Cryotherapy, Moist heat, and Manual therapy  PLAN FOR NEXT SESSION: Try quadruped work for core control, work on standing balance, ambulate 230' with [place], PT 02/20/2022, 3:44 PM

## 2022-02-22 ENCOUNTER — Ambulatory Visit: Payer: BC Managed Care – PPO

## 2022-02-22 DIAGNOSIS — R262 Difficulty in walking, not elsewhere classified: Secondary | ICD-10-CM

## 2022-02-22 DIAGNOSIS — R2689 Other abnormalities of gait and mobility: Secondary | ICD-10-CM | POA: Diagnosis not present

## 2022-02-22 DIAGNOSIS — M6281 Muscle weakness (generalized): Secondary | ICD-10-CM

## 2022-02-22 DIAGNOSIS — R2681 Unsteadiness on feet: Secondary | ICD-10-CM

## 2022-02-22 NOTE — Therapy (Signed)
OUTPATIENT PHYSICAL THERAPY TREATMENT NOTE   Patient Name: Maurice Cruz MRN: [id number] DOB:03/31/1966, 56 y.o., male Today's Date: 02/22/2022  PCP: Dr. Glendon Axe REFERRING PROVIDER: Courtney Heys, MD    PT End of Session - 02/22/22 1114     Visit Number 3    Number of Visits 17    Date for PT Re-Evaluation 04/05/22    Authorization Type [employer] (60 visit limit annually)    PT Start Time 71    PT Stop Time 1145    PT Time Calculation (min) 45 min    Equipment Utilized During Treatment Gait belt    Activity Tolerance Patient tolerated treatment well    Behavior During Therapy WFL for tasks assessed/performed              Past Medical History:  Diagnosis Date   Anxiety    Asthma    as a child   Depression    ED (erectile dysfunction)    GERD (gastroesophageal reflux disease)    Hx of spinal cord injury 03/2020   Hypertension    Neurogenic bladder    self caths   Neurogenic bowel    has to be digitially stimulated - 04/24/21   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[place])    bilateral legs   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[place])    Past Surgical History:  Procedure Laterality Date   Anorectal biospy  02/09/2021   Colon Biospy  02/09/2021   IR ANGIO INTRA EXTRACRAN SEL COM CAROTID INNOMINATE BILAT MOD SED  04/25/2021   IR ANGIO VERTEBRAL SEL SUBCLAVIAN INNOMINATE UNI L MOD SED  04/25/2021   IR ANGIO VERTEBRAL SEL VERTEBRAL UNI R MOD SED  04/25/2021   IR ANGIO/SPINAL LEFT  04/25/2021   IR ANGIO/SPINAL LEFT  04/25/2021   IR ANGIO/SPINAL LEFT  04/25/2021   IR ANGIO/SPINAL LEFT  04/25/2021   IR ANGIO/SPINAL LEFT  04/25/2021   IR ANGIO/SPINAL LEFT  04/25/2021   IR ANGIO/SPINAL LEFT  04/25/2021   IR ANGIO/SPINAL LEFT  04/25/2021   IR ANGIO/SPINAL LEFT  04/25/2021   IR ANGIO/SPINAL LEFT  04/25/2021   IR ANGIO/SPINAL LEFT  04/25/2021   IR ANGIO/SPINAL LEFT  04/25/2021   IR ANGIO/SPINAL LEFT  04/25/2021   IR ANGIO/SPINAL RIGHT  04/25/2021   IR ANGIO/SPINAL RIGHT  04/25/2021   IR ANGIO/SPINAL RIGHT  04/25/2021   IR  ANGIO/SPINAL RIGHT  04/25/2021   IR ANGIO/SPINAL RIGHT  04/25/2021   IR ANGIO/SPINAL RIGHT  04/25/2021   IR ANGIO/SPINAL RIGHT  04/25/2021   IR ANGIO/SPINAL RIGHT  04/25/2021   IR ANGIO/SPINAL RIGHT  04/25/2021   IR ANGIO/SPINAL RIGHT  04/25/2021   IR ANGIO/SPINAL RIGHT  04/25/2021   IR ANGIO/SPINAL RIGHT  04/25/2021   IR ANGIO/SPINAL RIGHT  04/25/2021   IR ANGIO/SPINAL RIGHT  04/25/2021   IR ANGIOGRAM EXTREMITY BILATERAL  04/25/2021   IR RADIOLOGIST EVAL & MGMT  03/21/2021   RADIOLOGY WITH ANESTHESIA N/A 04/25/2021   Procedure: IR WITH ANESTHESIA SPINAL ANGIOGRAM;  Surgeon: Luanne Bras, MD;  Location[facility];  Service: Radiology;  Laterality: N/A;   Patient Active Problem List   Diagnosis Date Noted   Disease of spinal cord ([place]) 05/21/2021   Anti-RNP antibodies present 05/21/2021   Weakness of both lower extremities 02/05/2021   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   Neurogenic bladder 12/08/2020   Neurogenic bowel 12/08/2020   Paraplegia following spinal cord injury ([place]) 12/08/2020   Wheelchair dependence 12/08/2020   Spasticity 12/08/2020   Chronic bilateral thoracic back pain 12/08/2020  ONSET DATE: 02/04/22 (date of referral)  REFERRING DIAG: G37.3 (ICD-10-CM) - 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[place]) G82.20 (ICD-10-CM) - Paraplegia following spinal cord injury ([place])   THERAPY DIAG:  Other abnormalities of gait and mobility  Muscle weakness (generalized)  Difficulty in walking, not elsewhere classified  Unsteadiness on feet  SUBJECTIVE:                                                                                                                                                                                              SUBJECTIVE STATEMENT: hx of HTN who developed transverse myelitis d'xd in 7/21. With neurogenic bowel and bladder and spasticity. They tried IVIG and steroids but patient didn't feel any change from it with his symptoms (this was after his diagnosis) Pt accompanied  by:  father  PERTINENT HISTORY: Transverse myelitis, neurogenic bowel and bladder  PAIN:  Are you having pain? Yes: NPRS scale: 2/10 Pain location: back Pain description: dull Aggravating factors: standing/walking,prolonged sitting, and lying Relieving factors: reposition rest  PRECAUTIONS: None  WEIGHT BEARING RESTRICTIONS No  FALLS: Has patient fallen in last 6 months? No  LIVING ENVIRONMENT: Lives with:  mother and father Lives in: House/apartment Stairs: Yes: Internal: 12 steps; on right going up and on left going up and External: 1 steps; none, pt has chair lift at home for stairs. Has following equipment at home: Gilford Rile - 2 wheeled, Wheelchair (manual), and bed side commode  PLOF:  Independent with dressing, self care, transfer, parents help with cooking, cleaning.  PATIENT GOALS Improve strength in R leg, improve walking  OBJECTIVE:     LE ROM:     Active  Right EVAL Left EVAL  Hip flexion    Hip extension    Hip abduction    Hip adduction    Hip internal rotation    Hip external rotation    Knee flexion    Knee extension    Ankle dorsiflexion    Ankle plantarflexion    Ankle inversion    Ankle eversion     (Blank rows = not tested)  MMT:    MMT Right EAVL Left EVAL  Hip flexion 1+ 3+  Hip extension    Hip abduction 1+ 3+  Hip adduction 1+ 3+  Hip internal rotation    Hip external rotation    Knee flexion 1+ 4  Knee extension 1+ 4  Ankle dorsiflexion 1+ 3+  Ankle plantarflexion 1+ 3+  Ankle inversion    Ankle eversion    (Blank rows = not tested)  Pt uses manual wheelchair when he goes to MD appts or stores. Inside the house  he uses walker for household mobility.    FUNCTIONAL TESTs:  5 times sit to stand: 47 sec Timed up and go (TUG): 1 min with RW 10 meter walk test: 0.27 m/s with RW    TODAY'S TREATMENT:  Gait training: 1 x 230' cues for hip hike during swing phase and avoiding to twist pelvis to R during R swing phase to  avoid using hip adductors but using hip flexors more.  Standing 90 deg turn with 1-2 UE support: 10x R and L Side stepping at countertop; bil UE support: 2 x 10 feet, cues for pelvis lift on R to improve foot clearance. Plank on mat table where pt tries to lift the knee up to mat table to improve hip flexion: 10x R and L, max A required for R LE Supine to sidelying log roll with contralateral knee in hooklying pt cued to engage abdominals and flexing the hip on contralateral side to engage abdominals: mod to max A required with LE from supine to L Sidelying: 3 x 10 R and L Prone knee flexion AROM: 2 x 10 R only, required PT holding the pelvis and proximal thigh due to instability with hip rotators Prone knee flexion: eccentric lowering from flexed position: 2 x 10 L only, pt cued to slow down lowering, pt asked to dorsiflex ankle to improve knee flexors co contraction   PATIENT EDUCATION: Education details: see above Person educated: Patient Education method: Explanation Education comprehension: verbalized understanding   HOME EXERCISE PROGRAM: Access Code: [email or web address] URL: [email or web address] Date: 02/20/2022 Prepared by: Markus Jarvis  Exercises - Side Stepping with Counter Support  - 1 x daily - 7 x weekly - 2 sets - 10 reps - Sit to Stand with Counter Support  - 1 x daily - 7 x weekly - 10 reps    GOALS: Goals reviewed with patient? Yes  SHORT TERM GOALS: Target date: 03/22/2022   (Remove Blue Hyperlink)  Patient will be able to ambulate 500' with RW without needing a rest break to improve functional walking endurance. Baseline:20 meters, 115' with RW (02/20/22); 230' wit RW (02/22/22) Goal status: Progressing continue  2.  Patient will get a shower bench to improve his tub transfers while reducing risk for fall and reduce strain on his shoulders while getting in and out of the car.  Baseline: takes shower in tub Goal status: INITIAL  3.  Pt will be able to  tolerate stand/walk activities or 10 min Baseline: 5 min Goal status: INITIAL  LONG TERM GOALS: Target date: 04/19/2022   (Remove Blue Hyperlink)  Patient will demo gait speed with walker of at least 0.47m/s to improve his functional ambulation status to limited community ambulator Baseline: 0.37m/s with RW (02/08/22) Goal status: INITIAL  2.  Pt will demo TUG score of <40 sec with RW to improve functional mobility Baseline: 1 min (02/08/22) Goal status: INITIAL  3.  Patient will be able to perform 5x sit to stand with use of bil UE support in <35 seconds to improve functional strength. Baseline: 47 seconds (01/29/22) Goal status: INITIAL  4 ASSESSMENT:  CLINICAL IMPRESSION: We continued to focus on engagement of core and R hip flexion with different functional activities to improve proximal stability and control (espeically on R side) during functional activities.    OBJECTIVE IMPAIRMENTS Abnormal gait, decreased activity tolerance, decreased balance, decreased endurance, decreased mobility, difficulty walking, decreased ROM, decreased strength, hypomobility, increased fascial restrictions, impaired flexibility, impaired sensation, impaired tone, improper body mechanics, postural dysfunction,  and pain.   ACTIVITY LIMITATIONS cleaning, community activity, driving, meal prep, laundry, and yard work.   PERSONAL FACTORS Age, Past/current experiences, and Time since onset of injury/illness/exacerbation are also affecting patient's functional outcome.    REHAB POTENTIAL: Good  CLINICAL DECISION MAKING: Stable/uncomplicated  EVALUATION COMPLEXITY: Low  PLAN: PT FREQUENCY: 2x/week  PT DURATION: 8 weeks  PLANNED INTERVENTIONS: Therapeutic exercises, Therapeutic activity, Neuromuscular re-education, Balance training, Gait training, Patient/Family education, Joint mobilization, Stair training, Orthotic/Fit training, Aquatic Therapy, Spinal mobilization, Cryotherapy, Moist heat, and Manual  therapy  PLAN FOR NEXT SESSION: Try quadruped work for core control, work on standing balance, ambulate 230' with [place], PT 02/22/2022, 12:00 PM

## 2022-02-28 ENCOUNTER — Ambulatory Visit: Payer: BC Managed Care – PPO | Attending: Physical Medicine and Rehabilitation | Admitting: Physical Therapy

## 2022-02-28 ENCOUNTER — Encounter: Payer: Self-pay | Admitting: Physical Therapy

## 2022-02-28 DIAGNOSIS — R262 Difficulty in walking, not elsewhere classified: Secondary | ICD-10-CM

## 2022-02-28 DIAGNOSIS — R2681 Unsteadiness on feet: Secondary | ICD-10-CM | POA: Insufficient documentation

## 2022-02-28 DIAGNOSIS — G373 Acute transverse myelitis in demyelinating disease of central nervous system: Secondary | ICD-10-CM | POA: Diagnosis present

## 2022-02-28 DIAGNOSIS — R293 Abnormal posture: Secondary | ICD-10-CM | POA: Insufficient documentation

## 2022-02-28 DIAGNOSIS — M6281 Muscle weakness (generalized): Secondary | ICD-10-CM | POA: Diagnosis present

## 2022-02-28 DIAGNOSIS — R2689 Other abnormalities of gait and mobility: Secondary | ICD-10-CM

## 2022-02-28 NOTE — Therapy (Signed)
OUTPATIENT PHYSICAL THERAPY TREATMENT NOTE   Patient Name: CORRION STIREWALT MRN: [id number] DOB:July 26, 1966, 56 y.o., male Today's Date: 02/28/2022  PCP: Dr. Glendon Axe REFERRING PROVIDER: Courtney Heys, MD    PT End of Session - 02/28/22 1403     Visit Number 4    Number of Visits 17    Date for PT Re-Evaluation 04/05/22    Authorization Type BC[employer] (60 visit limit annually)    PT Start Time 41    PT Stop Time 1443    PT Time Calculation (min) 42 min    Equipment Utilized During Treatment Gait belt    Activity Tolerance Patient tolerated treatment well    Behavior During Therapy WFL for tasks assessed/performed              Past Medical History:  Diagnosis Date   Anxiety    Asthma    as a child   Depression    ED (erectile dysfunction)    GERD (gastroesophageal reflux disease)    Hx of spinal cord injury 03/2020   Hypertension    Neurogenic bladder    self caths   Neurogenic bowel    has to be digitially stimulated - 04/24/21   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[place])    bilateral legs   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[place])    Past Surgical History:  Procedure Laterality Date   Anorectal biospy  02/09/2021   Colon Biospy  02/09/2021   IR ANGIO INTRA EXTRACRAN SEL COM CAROTID INNOMINATE BILAT MOD SED  04/25/2021   IR ANGIO VERTEBRAL SEL SUBCLAVIAN INNOMINATE UNI L MOD SED  04/25/2021   IR ANGIO VERTEBRAL SEL VERTEBRAL UNI R MOD SED  04/25/2021   IR ANGIO/SPINAL LEFT  04/25/2021   IR ANGIO/SPINAL LEFT  04/25/2021   IR ANGIO/SPINAL LEFT  04/25/2021   IR ANGIO/SPINAL LEFT  04/25/2021   IR ANGIO/SPINAL LEFT  04/25/2021   IR ANGIO/SPINAL LEFT  04/25/2021   IR ANGIO/SPINAL LEFT  04/25/2021   IR ANGIO/SPINAL LEFT  04/25/2021   IR ANGIO/SPINAL LEFT  04/25/2021   IR ANGIO/SPINAL LEFT  04/25/2021   IR ANGIO/SPINAL LEFT  04/25/2021   IR ANGIO/SPINAL LEFT  04/25/2021   IR ANGIO/SPINAL LEFT  04/25/2021   IR ANGIO/SPINAL RIGHT  04/25/2021   IR ANGIO/SPINAL RIGHT  04/25/2021   IR ANGIO/SPINAL RIGHT  04/25/2021   IR  ANGIO/SPINAL RIGHT  04/25/2021   IR ANGIO/SPINAL RIGHT  04/25/2021   IR ANGIO/SPINAL RIGHT  04/25/2021   IR ANGIO/SPINAL RIGHT  04/25/2021   IR ANGIO/SPINAL RIGHT  04/25/2021   IR ANGIO/SPINAL RIGHT  04/25/2021   IR ANGIO/SPINAL RIGHT  04/25/2021   IR ANGIO/SPINAL RIGHT  04/25/2021   IR ANGIO/SPINAL RIGHT  04/25/2021   IR ANGIO/SPINAL RIGHT  04/25/2021   IR ANGIO/SPINAL RIGHT  04/25/2021   IR ANGIOGRAM EXTREMITY BILATERAL  04/25/2021   IR RADIOLOGIST EVAL & MGMT  03/21/2021   RADIOLOGY WITH ANESTHESIA N/A 04/25/2021   Procedure: IR WITH ANESTHESIA SPINAL ANGIOGRAM;  Surgeon: Luanne Bras, MD;  Location: [facility] OR;  Service: Radiology;  Laterality: N/A;   Patient Active Problem List   Diagnosis Date Noted   Disease of spinal cord ([place]) 05/21/2021   Anti-RNP antibodies present 05/21/2021   Weakness of both lower extremities 02/05/2021   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   Neurogenic bladder 12/08/2020   Neurogenic bowel 12/08/2020   Paraplegia following spinal cord injury ([place]) 12/08/2020   Wheelchair dependence 12/08/2020   Spasticity 12/08/2020   Chronic bilateral thoracic back pain 12/08/2020  ONSET DATE: 02/04/22 (date of referral)  REFERRING DIAG: G37.3 (ICD-10-CM) - 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[place]) G82.20 (ICD-10-CM) - Paraplegia following spinal cord injury ([place])   THERAPY DIAG:  Other abnormalities of gait and mobility  Muscle weakness (generalized)  Difficulty in walking, not elsewhere classified  SUBJECTIVE:                                                                                                                                                                                              SUBJECTIVE STATEMENT: hx of HTN who developed transverse myelitis d'xd in 7/21. With neurogenic bowel and bladder and spasticity. They tried IVIG and steroids but patient didn't feel any change from it with his symptoms (this was after his diagnosis) Pt accompanied by:   father  PERTINENT HISTORY: Transverse myelitis, neurogenic bowel and bladder  PAIN:  Are you having pain? Yes: NPRS scale: 2-3/10 Pain location: midline low back Pain description: dull Aggravating factors: standing/walking,prolonged sitting, and lying Relieving factors: reposition rest  PRECAUTIONS: None  WEIGHT BEARING RESTRICTIONS No  FALLS: Has patient fallen in last 6 months? No  LIVING ENVIRONMENT: Lives with:  mother and father Lives in: House/apartment Stairs: Yes: Internal: 12 steps; on right going up and on left going up and External: 1 steps; none, pt has chair lift at home for stairs. Has following equipment at home: Gilford Rile - 2 wheeled, Wheelchair (manual), and bed side commode  PLOF:  Independent with dressing, self care, transfer, parents help with cooking, cleaning.  PATIENT GOALS Improve strength in R leg, improve walking  OBJECTIVE:     LE ROM:     Active  Right EVAL Left EVAL  Hip flexion    Hip extension    Hip abduction    Hip adduction    Hip internal rotation    Hip external rotation    Knee flexion    Knee extension    Ankle dorsiflexion    Ankle plantarflexion    Ankle inversion    Ankle eversion     (Blank rows = not tested)  MMT:    MMT Right EAVL Left EVAL  Hip flexion 1+ 3+  Hip extension    Hip abduction 1+ 3+  Hip adduction 1+ 3+  Hip internal rotation    Hip external rotation    Knee flexion 1+ 4  Knee extension 1+ 4  Ankle dorsiflexion 1+ 3+  Ankle plantarflexion 1+ 3+  Ankle inversion    Ankle eversion    (Blank rows = not tested)  Pt uses manual wheelchair when he goes to MD appts or stores. Inside the house he uses  walker for household mobility.    FUNCTIONAL TESTs:  5 times sit to stand: 47 sec Timed up and go (TUG): 1 min with RW 10 meter walk test: 0.27 m/s with RW    TODAY'S TREATMENT:   GAIT: Gait pattern: step through pattern, decreased stance time- Right, decreased hip/knee flexion- Right,  decreased ankle dorsiflexion- Right, circumduction- Right, Right foot flat, decreased trunk rotation, and narrow BOS Distance walked: 345' Assistive device utilized: Environmental consultant - 2 wheeled Level of assistance: CGA Comments: During final lap pt begins to circumduct RLE mildly.  He demonstrates intermittent scissoring of RLE during hip flexor engagement narrowing BOS during swing into initial contact. Pt transfers to mat table from w/c stand step w/ RW SBA In quadruped pt performs full rocks forward into plank on knees and back into prayer stretch w/ cues for glut max engagement 2x8 Pt requires 1 side-lying rest x68min following. In quadruped pt walks hands up blue ramp to top and back down to engage core and shoulder musculature 2x6 Quadruped to tall kneeling using therapist assistance w/ pt propped on therapist shoulder w/ BUE support progressed to unilateral UE support; pt demos instability in tall kneeling so regressed to quadruped w/ use of bench to return to tall kneeling w/ improved pt independence practicing eccentric lower to heels x5    PATIENT EDUCATION: Education details: Continue HEP. Person educated: Patient Education method: Explanation Education comprehension: verbalized understanding   HOME EXERCISE PROGRAM: Access Code: [email or web address] URL: [email or web address] Date: 02/20/2022 Prepared by: Markus Jarvis  Exercises - Side Stepping with Counter Support  - 1 x daily - 7 x weekly - 2 sets - 10 reps - Sit to Stand with Counter Support  - 1 x daily - 7 x weekly - 10 reps    GOALS: Goals reviewed with patient? Yes  SHORT TERM GOALS: Target date: 03/28/2022   (Remove Blue Hyperlink)  Patient will be able to ambulate 500' with RW without needing a rest break to improve functional walking endurance. Baseline:20 meters, 115' with RW (02/20/22); 230' wit RW (02/22/22) Goal status: Progressing continue  2.  Patient will get a shower bench to improve his tub transfers  while reducing risk for fall and reduce strain on his shoulders while getting in and out of the car.  Baseline: takes shower in tub Goal status: INITIAL  3.  Pt will be able to tolerate stand/walk activities or 10 min Baseline: 5 min Goal status: INITIAL  LONG TERM GOALS: Target date: 04/25/2022   (Remove Blue Hyperlink)  Patient will demo gait speed with walker of at least 0.22m/s to improve his functional ambulation status to limited community ambulator Baseline: 0.11m/s with RW (02/08/22) Goal status: INITIAL  2.  Pt will demo TUG score of <40 sec with RW to improve functional mobility Baseline: 1 min (02/08/22) Goal status: INITIAL  3.  Patient will be able to perform 5x sit to stand with use of bil UE support in <35 seconds to improve functional strength. Baseline: 47 seconds (01/29/22) Goal status: INITIAL  ASSESSMENT:  CLINICAL IMPRESSION: Focus of skilled session on core engagement in dynamic quadruped positioning.  Pt ambulates 345' using RW and CGA this session.  He continues to benefit from skilled PT to address standing balance, ambulation deficits, and tolerance to activity.  Will continue per POC.   OBJECTIVE IMPAIRMENTS Abnormal gait, decreased activity tolerance, decreased balance, decreased endurance, decreased mobility, difficulty walking, decreased ROM, decreased strength, hypomobility, increased fascial restrictions, impaired flexibility, impaired sensation, impaired tone, improper  body mechanics, postural dysfunction, and pain.   ACTIVITY LIMITATIONS cleaning, community activity, driving, meal prep, laundry, and yard work.   PERSONAL FACTORS Age, Past/current experiences, and Time since onset of injury/illness/exacerbation are also affecting patient's functional outcome.    REHAB POTENTIAL: Good  CLINICAL DECISION MAKING: Stable/uncomplicated  EVALUATION COMPLEXITY: Low  PLAN: PT FREQUENCY: 2x/week  PT DURATION: 8 weeks  PLANNED INTERVENTIONS: Therapeutic  exercises, Therapeutic activity, Neuromuscular re-education, Balance training, Gait training, Patient/Family education, Joint mobilization, Stair training, Orthotic/Fit training, Aquatic Therapy, Spinal mobilization, Cryotherapy, Moist heat, and Manual therapy  PLAN FOR NEXT SESSION:  quadruped work for core control-try quadruped with ball roll, quadruped reach to cones forward and laterally, work on standing balance, ambulate (345' last session) with RW, tall kneeling-try static holds, perturbations, weighted ball taps to cone w/ return to tall kneeling  Bary Richard, PT, DPT 02/28/2022, 3:49 PM

## 2022-03-01 ENCOUNTER — Ambulatory Visit: Payer: BC Managed Care – PPO

## 2022-03-01 DIAGNOSIS — R2681 Unsteadiness on feet: Secondary | ICD-10-CM

## 2022-03-01 DIAGNOSIS — R262 Difficulty in walking, not elsewhere classified: Secondary | ICD-10-CM

## 2022-03-01 DIAGNOSIS — R2689 Other abnormalities of gait and mobility: Secondary | ICD-10-CM

## 2022-03-01 DIAGNOSIS — M6281 Muscle weakness (generalized): Secondary | ICD-10-CM

## 2022-03-01 NOTE — Therapy (Signed)
OUTPATIENT PHYSICAL THERAPY TREATMENT NOTE   Patient Name: Maurice Cruz MRN: [id number] DOB:Feb 07, 1966, 56 y.o., male Today's Date: 03/01/2022  PCP: Dr. Glendon Axe REFERRING PROVIDER: Courtney Heys, MD    PT End of Session - 03/01/22 1058     Visit Number 5    Number of Visits 17    Date for PT Re-Evaluation 04/05/22    Authorization Type [employer] (60 visit limit annually)    PT Start Time 52    PT Stop Time 1145    PT Time Calculation (min) 45 min    Equipment Utilized During Treatment Gait belt    Activity Tolerance Patient tolerated treatment well    Behavior During Therapy WFL for tasks assessed/performed              Past Medical History:  Diagnosis Date   Anxiety    Asthma    as a child   Depression    ED (erectile dysfunction)    GERD (gastroesophageal reflux disease)    Hx of spinal cord injury 03/2020   Hypertension    Neurogenic bladder    self caths   Neurogenic bowel    has to be digitially stimulated - 04/24/21   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[place])    bilateral legs   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[place])    Past Surgical History:  Procedure Laterality Date   Anorectal biospy  02/09/2021   Colon Biospy  02/09/2021   IR ANGIO INTRA EXTRACRAN SEL COM CAROTID INNOMINATE BILAT MOD SED  04/25/2021   IR ANGIO VERTEBRAL SEL SUBCLAVIAN INNOMINATE UNI L MOD SED  04/25/2021   IR ANGIO VERTEBRAL SEL VERTEBRAL UNI R MOD SED  04/25/2021   IR ANGIO/SPINAL LEFT  04/25/2021   IR ANGIO/SPINAL LEFT  04/25/2021   IR ANGIO/SPINAL LEFT  04/25/2021   IR ANGIO/SPINAL LEFT  04/25/2021   IR ANGIO/SPINAL LEFT  04/25/2021   IR ANGIO/SPINAL LEFT  04/25/2021   IR ANGIO/SPINAL LEFT  04/25/2021   IR ANGIO/SPINAL LEFT  04/25/2021   IR ANGIO/SPINAL LEFT  04/25/2021   IR ANGIO/SPINAL LEFT  04/25/2021   IR ANGIO/SPINAL LEFT  04/25/2021   IR ANGIO/SPINAL LEFT  04/25/2021   IR ANGIO/SPINAL LEFT  04/25/2021   IR ANGIO/SPINAL RIGHT  04/25/2021   IR ANGIO/SPINAL RIGHT  04/25/2021   IR ANGIO/SPINAL RIGHT  04/25/2021   IR  ANGIO/SPINAL RIGHT  04/25/2021   IR ANGIO/SPINAL RIGHT  04/25/2021   IR ANGIO/SPINAL RIGHT  04/25/2021   IR ANGIO/SPINAL RIGHT  04/25/2021   IR ANGIO/SPINAL RIGHT  04/25/2021   IR ANGIO/SPINAL RIGHT  04/25/2021   IR ANGIO/SPINAL RIGHT  04/25/2021   IR ANGIO/SPINAL RIGHT  04/25/2021   IR ANGIO/SPINAL RIGHT  04/25/2021   IR ANGIO/SPINAL RIGHT  04/25/2021   IR ANGIO/SPINAL RIGHT  04/25/2021   IR ANGIOGRAM EXTREMITY BILATERAL  04/25/2021   IR RADIOLOGIST EVAL & MGMT  03/21/2021   RADIOLOGY WITH ANESTHESIA N/A 04/25/2021   Procedure: IR WITH ANESTHESIA SPINAL ANGIOGRAM;  Surgeon: Luanne Bras, MD;  Location: [facility] OR;  Service: Radiology;  Laterality: N/A;   Patient Active Problem List   Diagnosis Date Noted   Disease of spinal cord ([place]) 05/21/2021   Anti-RNP antibodies present 05/21/2021   Weakness of both lower extremities 02/05/2021   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   Neurogenic bladder 12/08/2020   Neurogenic bowel 12/08/2020   Paraplegia following spinal cord injury ([place]) 12/08/2020   Wheelchair dependence 12/08/2020   Spasticity 12/08/2020   Chronic bilateral thoracic back pain 12/08/2020  ONSET DATE: 02/04/22 (date of referral)  REFERRING DIAG: G37.3 (ICD-10-CM) - 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[place]) G82.20 (ICD-10-CM) - Paraplegia following spinal cord injury ([place])   THERAPY DIAG:  Other abnormalities of gait and mobility  Muscle weakness (generalized)  Difficulty in walking, not elsewhere classified  Unsteadiness on feet  SUBJECTIVE:                                                                                                                                                                                              SUBJECTIVE STATEMENT: hx of HTN who developed transverse myelitis d'xd in 7/21. With neurogenic bowel and bladder and spasticity. They tried IVIG and steroids but patient didn't feel any change from it with his symptoms (this was after his diagnosis) Pt accompanied  by:  father  PERTINENT HISTORY: Transverse myelitis, neurogenic bowel and bladder  PAIN:  Are you having pain? Yes: NPRS scale: 2-3/10 Pain location: midline low back Pain description: dull Aggravating factors: standing/walking,prolonged sitting, and lying Relieving factors: reposition rest  PRECAUTIONS: None  WEIGHT BEARING RESTRICTIONS No  FALLS: Has patient fallen in last 6 months? No  LIVING ENVIRONMENT: Lives with:  mother and father Lives in: House/apartment Stairs: Yes: Internal: 12 steps; on right going up and on left going up and External: 1 steps; none, pt has chair lift at home for stairs. Has following equipment at home: Gilford Rile - 2 wheeled, Wheelchair (manual), and bed side commode  PLOF:  Independent with dressing, self care, transfer, parents help with cooking, cleaning.  PATIENT GOALS Improve strength in R leg, improve walking  OBJECTIVE:     LE ROM:     Active  Right EVAL Left EVAL  Hip flexion    Hip extension    Hip abduction    Hip adduction    Hip internal rotation    Hip external rotation    Knee flexion    Knee extension    Ankle dorsiflexion    Ankle plantarflexion    Ankle inversion    Ankle eversion     (Blank rows = not tested)  MMT:    MMT Right EAVL Left EVAL  Hip flexion 1+ 3+  Hip extension    Hip abduction 1+ 3+  Hip adduction 1+ 3+  Hip internal rotation    Hip external rotation    Knee flexion 1+ 4  Knee extension 1+ 4  Ankle dorsiflexion 1+ 3+  Ankle plantarflexion 1+ 3+  Ankle inversion    Ankle eversion    (Blank rows = not tested)  Pt uses manual wheelchair when he goes to MD appts or stores. Inside  the house he uses walker for household mobility.    FUNCTIONAL TESTs:  5 times sit to stand: 47 sec Timed up and go (TUG): 1 min with RW 10 meter walk test: 0.27 m/s with RW    TODAY'S TREATMENT:   Gait training: 1 x 230' with black sport cord and RW, sport cord applied at ASIS bil with posteriro pull to  faciliate anterior pelvis elevation during swing phase bil Seated break 1 x 115' with RW, pt reported significant fatigue but with strides after resisted walking.  PNF:L sidelying: anterior elevation and posterior depression of R pelvis with AAROM, AROM and manual resistance with slow reversals 3 x 10. Isometric anterior elevation with manually resisted hip flexion isometrics, concentric and eccentric hip flexion with con contraction of anterior pelvis elevation x 5 1 x 115' ambulation with RW, pt reported further ease of moving L LE but was limited from fatigue from session. Pt educated on trying L sidelying with hooklying position: R UE providing  AA hip flexion to R hip and then pt pushes leg into extension with UE resisting it. Pt educated to perform alternating AA flexion and resisted extension for 3 x 10 daily for HEP  PATIENT EDUCATION: Education details: Continue HEP. Person educated: Patient Education method: Explanation Education comprehension: verbalized understanding   HOME EXERCISE PROGRAM: Access Code: [email or web address] URL: [email or web address] Date: 02/20/2022 Prepared by: Markus Jarvis  Exercises - Side Stepping with Counter Support  - 1 x daily - 7 x weekly - 2 sets - 10 reps - Sit to Stand with Counter Support  - 1 x daily - 7 x weekly - 10 reps   Added on 03/01/2022  Pt educated on trying L sidelying with hooklying position: R UE providing  AA hip flexion to R hip and then pt pushes leg into extension with UE resisting it. Pt educated to perform alternating AA flexion and resisted extension for 3 x 10 daily for HEP    GOALS: Goals reviewed with patient? Yes  SHORT TERM GOALS: Target date: 03/28/2022   (Remove Blue Hyperlink)  Patient will be able to ambulate 500' with RW without needing a rest break to improve functional walking endurance. Baseline:20 meters, 115' with RW (02/20/22); 230' wit RW (02/22/22) Goal status: Progressing continue  2.  Patient will  get a shower bench to improve his tub transfers while reducing risk for fall and reduce strain on his shoulders while getting in and out of the car.  Baseline: takes shower in tub Goal status: INITIAL  3.  Pt will be able to tolerate stand/walk activities or 10 min Baseline: 5 min Goal status: INITIAL  LONG TERM GOALS: Target date: 04/25/2022   (Remove Blue Hyperlink)  Patient will demo gait speed with walker of at least 0.3m/s to improve his functional ambulation status to limited community ambulator Baseline: 0.38m/s with RW (02/08/22) Goal status: INITIAL  2.  Pt will demo TUG score of <40 sec with RW to improve functional mobility Baseline: 1 min (02/08/22) Goal status: INITIAL  3.  Patient will be able to perform 5x sit to stand with use of bil UE support in <35 seconds to improve functional strength. Baseline: 47 seconds (01/29/22) Goal status: INITIAL  ASSESSMENT:  CLINICAL IMPRESSION: Pt is reporting compliance with HEP at home. Pt reported fatigue with resisted walking but reported improved ability to swing legs forward with normal walking after resisted walking. We performed some proprioceptive neuromuscular facilitation to improve co contraction of abdominals and hip flexion during  swing phase.    OBJECTIVE IMPAIRMENTS Abnormal gait, decreased activity tolerance, decreased balance, decreased endurance, decreased mobility, difficulty walking, decreased ROM, decreased strength, hypomobility, increased fascial restrictions, impaired flexibility, impaired sensation, impaired tone, improper body mechanics, postural dysfunction, and pain.   ACTIVITY LIMITATIONS cleaning, community activity, driving, meal prep, laundry, and yard work.   PERSONAL FACTORS Age, Past/current experiences, and Time since onset of injury/illness/exacerbation are also affecting patient's functional outcome.    REHAB POTENTIAL: Good  CLINICAL DECISION MAKING: Stable/uncomplicated  EVALUATION COMPLEXITY:  Low  PLAN: PT FREQUENCY: 2x/week  PT DURATION: 8 weeks  PLANNED INTERVENTIONS: Therapeutic exercises, Therapeutic activity, Neuromuscular re-education, Balance training, Gait training, Patient/Family education, Joint mobilization, Stair training, Orthotic/Fit training, Aquatic Therapy, Spinal mobilization, Cryotherapy, Moist heat, and Manual therapy  PLAN FOR NEXT SESSION:  quadruped work for core control-try quadruped with ball roll, quadruped reach to cones forward and laterally, work on standing balance, ambulate (345' last session) with RW, tall kneeling-try static holds, perturbations, weighted ball taps to cone w/ return to tall kneeling  Kerrie Pleasure, PT, DPT 03/01/2022, 10:59 AM

## 2022-03-06 ENCOUNTER — Ambulatory Visit: Payer: BC Managed Care – PPO

## 2022-03-06 DIAGNOSIS — M6281 Muscle weakness (generalized): Secondary | ICD-10-CM

## 2022-03-06 DIAGNOSIS — R262 Difficulty in walking, not elsewhere classified: Secondary | ICD-10-CM

## 2022-03-06 DIAGNOSIS — R2689 Other abnormalities of gait and mobility: Secondary | ICD-10-CM | POA: Diagnosis not present

## 2022-03-06 DIAGNOSIS — R2681 Unsteadiness on feet: Secondary | ICD-10-CM

## 2022-03-06 NOTE — Therapy (Signed)
OUTPATIENT PHYSICAL THERAPY TREATMENT NOTE   Patient Name: Maurice Cruz MRN: [id number] DOB:1966/05/30, 56 y.o., male Today's Date: 03/06/2022  PCP: Dr. Glendon Axe REFERRING PROVIDER: Courtney Heys, MD    PT End of Session - 03/06/22 1502     Visit Number 6    Number of Visits 17    Date for PT Re-Evaluation 04/05/22    Authorization Type BCBS (60 visit limit annually)    PT Start Time 1400    PT Stop Time 1445    PT Time Calculation (min) 45 min    Equipment Utilized During Treatment Gait belt    Activity Tolerance Patient tolerated treatment well    Behavior During Therapy WFL for tasks assessed/performed               Past Medical History:  Diagnosis Date   Anxiety    Asthma    as a child   Depression    ED (erectile dysfunction)    GERD (gastroesophageal reflux disease)    Hx of spinal cord injury 03/2020   Hypertension    Neurogenic bladder    self caths   Neurogenic bowel    has to be digitially stimulated - 04/24/21   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[place])    bilateral legs   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[place])    Past Surgical History:  Procedure Laterality Date   Anorectal biospy  02/09/2021   Colon Biospy  02/09/2021   IR ANGIO INTRA EXTRACRAN SEL COM CAROTID INNOMINATE BILAT MOD SED  04/25/2021   IR ANGIO VERTEBRAL SEL SUBCLAVIAN INNOMINATE UNI L MOD SED  04/25/2021   IR ANGIO VERTEBRAL SEL VERTEBRAL UNI R MOD SED  04/25/2021   IR ANGIO/SPINAL LEFT  04/25/2021   IR ANGIO/SPINAL LEFT  04/25/2021   IR ANGIO/SPINAL LEFT  04/25/2021   IR ANGIO/SPINAL LEFT  04/25/2021   IR ANGIO/SPINAL LEFT  04/25/2021   IR ANGIO/SPINAL LEFT  04/25/2021   IR ANGIO/SPINAL LEFT  04/25/2021   IR ANGIO/SPINAL LEFT  04/25/2021   IR ANGIO/SPINAL LEFT  04/25/2021   IR ANGIO/SPINAL LEFT  04/25/2021   IR ANGIO/SPINAL LEFT  04/25/2021   IR ANGIO/SPINAL LEFT  04/25/2021   IR ANGIO/SPINAL LEFT  04/25/2021   IR ANGIO/SPINAL RIGHT  04/25/2021   IR ANGIO/SPINAL RIGHT  04/25/2021   IR ANGIO/SPINAL RIGHT  04/25/2021   IR  ANGIO/SPINAL RIGHT  04/25/2021   IR ANGIO/SPINAL RIGHT  04/25/2021   IR ANGIO/SPINAL RIGHT  04/25/2021   IR ANGIO/SPINAL RIGHT  04/25/2021   IR ANGIO/SPINAL RIGHT  04/25/2021   IR ANGIO/SPINAL RIGHT  04/25/2021   IR ANGIO/SPINAL RIGHT  04/25/2021   IR ANGIO/SPINAL RIGHT  04/25/2021   IR ANGIO/SPINAL RIGHT  04/25/2021   IR ANGIO/SPINAL RIGHT  04/25/2021   IR ANGIO/SPINAL RIGHT  04/25/2021   IR ANGIOGRAM EXTREMITY BILATERAL  04/25/2021   IR RADIOLOGIST EVAL & MGMT  03/21/2021   RADIOLOGY WITH ANESTHESIA N/A 04/25/2021   Procedure: IR WITH ANESTHESIA SPINAL ANGIOGRAM;  Surgeon: Luanne Bras, MD;  Location: [facility];  Service: Radiology;  Laterality: N/A;   Patient Active Problem List   Diagnosis Date Noted   Disease of spinal cord ([place]) 05/21/2021   Anti-RNP antibodies present 05/21/2021   Weakness of both lower extremities 02/05/2021   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   Neurogenic bladder 12/08/2020   Neurogenic bowel 12/08/2020   Paraplegia following spinal cord injury ([place]) 12/08/2020   Wheelchair dependence 12/08/2020   Spasticity 12/08/2020   Chronic bilateral thoracic back pain 12/08/2020  ONSET DATE: 02/04/22 (date of referral)  REFERRING DIAG: G37.3 (ICD-10-CM) - 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[place]) G82.20 (ICD-10-CM) - Paraplegia following spinal cord injury ([place])   THERAPY DIAG:  Other abnormalities of gait and mobility  Muscle weakness (generalized)  Difficulty in walking, not elsewhere classified  Unsteadiness on feet  SUBJECTIVE:                                                                                                                                                                                              SUBJECTIVE STATEMENT: I have been fighting stomach bug for last couple of days. Pt accompanied by:  father  PERTINENT HISTORY: Transverse myelitis, neurogenic bowel and bladder  PAIN:  Are you having pain? Yes: NPRS scale: 2-3/10 Pain location: midline low  back Pain description: dull Aggravating factors: standing/walking,prolonged sitting, and lying Relieving factors: reposition rest  PRECAUTIONS: None  WEIGHT BEARING RESTRICTIONS No  FALLS: Has patient fallen in last 6 months? No  LIVING ENVIRONMENT: Lives with:  mother and father Lives in: House/apartment Stairs: Yes: Internal: 12 steps; on right going up and on left going up and External: 1 steps; none, pt has chair lift at home for stairs. Has following equipment at home: Gilford Rile - 2 wheeled, Wheelchair (manual), and bed side commode  PLOF:  Independent with dressing, self care, transfer, parents help with cooking, cleaning.  PATIENT GOALS Improve strength in R leg, improve walking  OBJECTIVE:     LE ROM:     Active  Right EVAL Left EVAL  Hip flexion    Hip extension    Hip abduction    Hip adduction    Hip internal rotation    Hip external rotation    Knee flexion    Knee extension    Ankle dorsiflexion    Ankle plantarflexion    Ankle inversion    Ankle eversion     (Blank rows = not tested)  MMT:    MMT Right EAVL Left EVAL  Hip flexion 1+ 3+  Hip extension    Hip abduction 1+ 3+  Hip adduction 1+ 3+  Hip internal rotation    Hip external rotation    Knee flexion 1+ 4  Knee extension 1+ 4  Ankle dorsiflexion 1+ 3+  Ankle plantarflexion 1+ 3+  Ankle inversion    Ankle eversion    (Blank rows = not tested)  Pt uses manual wheelchair when he goes to MD appts or stores. Inside the house he uses walker for household mobility.    FUNCTIONAL TESTs:  5 times sit to stand: 47 sec Timed up and go (TUG): 1  min with RW 10 meter walk test: 0.27 m/s with RW    TODAY'S TREATMENT:   Q-ped lateral weight shifts: 10x R and L Q-ped knee taps on pillow in front of knee: 10x R and L Prone eccentric R knee flexion: 5x Prone bil manual resistance provided with flexion and etension of knees with ankles tied with gait belt to improve carry over of strength  from L to R with co contraction of bil LE: 10x  Prone with knees bent: wind shield wipers: 10x manual resistance R and L Prone glut squeee: 10x 10" holds Prone to L SL and R SL: 2 x 5 each way Push up plus with pt on knees on mat table and pushes up on 16' box in front: 10x Heel sitting to tall kneeling: 10x Gait training: 1 x 115', then placed yellow band around R leg to improve hip flexion during swing phase on R LE 345' with RW  PATIENT EDUCATION: Education details: Continue HEP. Person educated: Patient Education method: Explanation Education comprehension: verbalized understanding   HOME EXERCISE PROGRAM: Access Code: [email or web address] URL: [email or web address] Date: 02/20/2022 Prepared by: Markus Jarvis  Exercises - Side Stepping with Counter Support  - 1 x daily - 7 x weekly - 2 sets - 10 reps - Sit to Stand with Counter Support  - 1 x daily - 7 x weekly - 10 reps   Added on 03/01/2022  Pt educated on trying L sidelying with hooklying position: R UE providing  AA hip flexion to R hip and then pt pushes leg into extension with UE resisting it. Pt educated to perform alternating AA flexion and resisted extension for 3 x 10 daily for HEP    GOALS: Goals reviewed with patient? Yes  SHORT TERM GOALS: Target date: 03/28/2022   (Remove Blue Hyperlink)  Patient will be able to ambulate 500' with RW without needing a rest break to improve functional walking endurance. Baseline:20 meters, 115' with RW (02/20/22); 230' wit RW (02/22/22) Goal status: Progressing continue  2.  Patient will get a shower bench to improve his tub transfers while reducing risk for fall and reduce strain on his shoulders while getting in and out of the car.  Baseline: takes shower in tub Goal status: INITIAL  3.  Pt will be able to tolerate stand/walk activities or 10 min Baseline: 5 min Goal status: INITIAL  LONG TERM GOALS: Target date: 04/25/2022   (Remove Blue Hyperlink)  Patient will demo  gait speed with walker of at least 0.27m/s to improve his functional ambulation status to limited community ambulator Baseline: 0.89m/s with RW (02/08/22) Goal status: INITIAL  2.  Pt will demo TUG score of <40 sec with RW to improve functional mobility Baseline: 1 min (02/08/22) Goal status: INITIAL  3.  Patient will be able to perform 5x sit to stand with use of bil UE support in <35 seconds to improve functional strength. Baseline: 47 seconds (01/29/22) Goal status: INITIAL  ASSESSMENT:  CLINICAL IMPRESSION: Pt is reporting compliance with HEP at home. Pt reported fatigue with resisted walking but reported improved ability to swing legs forward with normal walking after resisted walking. We performed some proprioceptive neuromuscular facilitation to improve co contraction of abdominals and hip flexion during swing phase.    OBJECTIVE IMPAIRMENTS Abnormal gait, decreased activity tolerance, decreased balance, decreased endurance, decreased mobility, difficulty walking, decreased ROM, decreased strength, hypomobility, increased fascial restrictions, impaired flexibility, impaired sensation, impaired tone, improper body mechanics, postural dysfunction, and pain.   ACTIVITY  LIMITATIONS cleaning, community activity, driving, meal prep, laundry, and yard work.   PERSONAL FACTORS Age, Past/current experiences, and Time since onset of injury/illness/exacerbation are also affecting patient's functional outcome.    REHAB POTENTIAL: Good  CLINICAL DECISION MAKING: Stable/uncomplicated  EVALUATION COMPLEXITY: Low  PLAN: PT FREQUENCY: 2x/week  PT DURATION: 8 weeks  PLANNED INTERVENTIONS: Therapeutic exercises, Therapeutic activity, Neuromuscular re-education, Balance training, Gait training, Patient/Family education, Joint mobilization, Stair training, Orthotic/Fit training, Aquatic Therapy, Spinal mobilization, Cryotherapy, Moist heat, and Manual therapy  PLAN FOR NEXT SESSION:  try forearm  crutches next session  Kerrie Pleasure, PT, DPT 03/06/2022, 3:04 PM

## 2022-03-08 ENCOUNTER — Telehealth: Payer: Self-pay | Admitting: Physical Therapy

## 2022-03-08 ENCOUNTER — Ambulatory Visit: Payer: BC Managed Care – PPO | Admitting: Physical Therapy

## 2022-03-08 ENCOUNTER — Encounter: Payer: Self-pay | Admitting: Physical Therapy

## 2022-03-08 DIAGNOSIS — R2689 Other abnormalities of gait and mobility: Secondary | ICD-10-CM

## 2022-03-08 DIAGNOSIS — R2681 Unsteadiness on feet: Secondary | ICD-10-CM

## 2022-03-08 DIAGNOSIS — R262 Difficulty in walking, not elsewhere classified: Secondary | ICD-10-CM

## 2022-03-08 DIAGNOSIS — M6281 Muscle weakness (generalized): Secondary | ICD-10-CM

## 2022-03-08 NOTE — Telephone Encounter (Signed)
Dr. Dagoberto Cruz, Maurice Cruz is being treated by physical therapy for transverse myelitis.  He will benefit from use of right posterior Ottobock AFO in order to improve safety with functional mobility.    If you agree, please submit request in EPIC under MD Order, Other Orders (list right posterior Ottobock AFO in comments) or fax to [facility] at [phone].    He will also need to be seen by you for a note to justify the AFO so that [name] can begin the process.  If you would prefer his PCP see him for this please let the patient know so he can set that appointment up.  Thank you, Elease Etienne, PT, [facility] [address] [place], [place]  [address] Phone:  [phone] Fax:  [phone]

## 2022-03-08 NOTE — Therapy (Addendum)
OUTPATIENT PHYSICAL THERAPY TREATMENT NOTE   Patient Name: Maurice Cruz MRN: [id number] DOB:Nov 10, 1965, 56 y.o., male Today's Date: 03/08/2022  PCP: Dr. Glendon Axe REFERRING PROVIDER: Courtney Heys, MD    PT End of Session - 03/08/22 1200     Visit Number 7    Number of Visits 17    Date for PT Re-Evaluation 04/05/22    Authorization Type BCBS (60 visit limit annually)    PT Start Time 1107    PT Stop Time 1200    PT Time Calculation (min) 53 min    Equipment Utilized During Treatment Gait belt    Activity Tolerance Patient tolerated treatment well    Behavior During Therapy WFL for tasks assessed/performed                Past Medical History:  Diagnosis Date   Anxiety    Asthma    as a child   Depression    ED (erectile dysfunction)    GERD (gastroesophageal reflux disease)    Hx of spinal cord injury 03/2020   Hypertension    Neurogenic bladder    self caths   Neurogenic bowel    has to be digitially stimulated - 04/24/21   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[place])    bilateral legs   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[place])    Past Surgical History:  Procedure Laterality Date   Anorectal biospy  02/09/2021   Colon Biospy  02/09/2021   IR ANGIO INTRA EXTRACRAN SEL COM CAROTID INNOMINATE BILAT MOD SED  04/25/2021   IR ANGIO VERTEBRAL SEL SUBCLAVIAN INNOMINATE UNI L MOD SED  04/25/2021   IR ANGIO VERTEBRAL SEL VERTEBRAL UNI R MOD SED  04/25/2021   IR ANGIO/SPINAL LEFT  04/25/2021   IR ANGIO/SPINAL LEFT  04/25/2021   IR ANGIO/SPINAL LEFT  04/25/2021   IR ANGIO/SPINAL LEFT  04/25/2021   IR ANGIO/SPINAL LEFT  04/25/2021   IR ANGIO/SPINAL LEFT  04/25/2021   IR ANGIO/SPINAL LEFT  04/25/2021   IR ANGIO/SPINAL LEFT  04/25/2021   IR ANGIO/SPINAL LEFT  04/25/2021   IR ANGIO/SPINAL LEFT  04/25/2021   IR ANGIO/SPINAL LEFT  04/25/2021   IR ANGIO/SPINAL LEFT  04/25/2021   IR ANGIO/SPINAL LEFT  04/25/2021   IR ANGIO/SPINAL RIGHT  04/25/2021   IR ANGIO/SPINAL RIGHT  04/25/2021   IR ANGIO/SPINAL RIGHT  04/25/2021    IR ANGIO/SPINAL RIGHT  04/25/2021   IR ANGIO/SPINAL RIGHT  04/25/2021   IR ANGIO/SPINAL RIGHT  04/25/2021   IR ANGIO/SPINAL RIGHT  04/25/2021   IR ANGIO/SPINAL RIGHT  04/25/2021   IR ANGIO/SPINAL RIGHT  04/25/2021   IR ANGIO/SPINAL RIGHT  04/25/2021   IR ANGIO/SPINAL RIGHT  04/25/2021   IR ANGIO/SPINAL RIGHT  04/25/2021   IR ANGIO/SPINAL RIGHT  04/25/2021   IR ANGIO/SPINAL RIGHT  04/25/2021   IR ANGIOGRAM EXTREMITY BILATERAL  04/25/2021   IR RADIOLOGIST EVAL & MGMT  03/21/2021   RADIOLOGY WITH ANESTHESIA N/A 04/25/2021   Procedure: IR WITH ANESTHESIA SPINAL ANGIOGRAM;  Surgeon: Luanne Bras, MD;  Location: [facility];  Service: Radiology;  Laterality: N/A;   Patient Active Problem List   Diagnosis Date Noted   Disease of spinal cord ([place]) 05/21/2021   Anti-RNP antibodies present 05/21/2021   Weakness of both lower extremities 02/05/2021   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   Neurogenic bladder 12/08/2020   Neurogenic bowel 12/08/2020   Paraplegia following spinal cord injury ([place]) 12/08/2020   Wheelchair dependence 12/08/2020   Spasticity 12/08/2020   Chronic bilateral thoracic back pain 12/08/2020  ONSET DATE: 02/04/22 (date of referral)  REFERRING DIAG: G37.3 (ICD-10-CM) - 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[place]) G82.20 (ICD-10-CM) - Paraplegia following spinal cord injury ([place])   THERAPY DIAG:  Other abnormalities of gait and mobility  Muscle weakness (generalized)  Difficulty in walking, not elsewhere classified  Unsteadiness on feet  SUBJECTIVE:                                                                                                                                                                                              SUBJECTIVE STATEMENT: I have been fighting stomach bug for last couple of days. Pt accompanied by:  father  PERTINENT HISTORY: Transverse myelitis, neurogenic bowel and bladder  PAIN:  Are you having pain? Yes: NPRS scale: 2-3/10 Pain location: midline low  back Pain description: dull Aggravating factors: standing/walking,prolonged sitting, and lying Relieving factors: reposition rest  PRECAUTIONS: None  WEIGHT BEARING RESTRICTIONS No  FALLS: Has patient fallen in last 6 months? No  LIVING ENVIRONMENT: Lives with:  mother and father Lives in: House/apartment Stairs: Yes: Internal: 12 steps; on right going up and on left going up and External: 1 steps; none, pt has chair lift at home for stairs. Has following equipment at home: Gilford Rile - 2 wheeled, Wheelchair (manual), and bed side commode  PLOF:  Independent with dressing, self care, transfer, parents help with cooking, cleaning.  PATIENT GOALS Improve strength in R leg, improve walking  OBJECTIVE:     LE ROM:     Active  Right EVAL Left EVAL  Hip flexion    Hip extension    Hip abduction    Hip adduction    Hip internal rotation    Hip external rotation    Knee flexion    Knee extension    Ankle dorsiflexion    Ankle plantarflexion    Ankle inversion    Ankle eversion     (Blank rows = not tested)  MMT:    MMT Right EAVL Left EVAL  Hip flexion 1+ 3+  Hip extension    Hip abduction 1+ 3+  Hip adduction 1+ 3+  Hip internal rotation    Hip external rotation    Knee flexion 1+ 4  Knee extension 1+ 4  Ankle dorsiflexion 1+ 3+  Ankle plantarflexion 1+ 3+  Ankle inversion    Ankle eversion    (Blank rows = not tested)  Pt uses manual wheelchair when he goes to MD appts or stores. Inside the house he uses walker for household mobility.    FUNCTIONAL TESTs:  5 times sit to stand: 47 sec Timed up and go (TUG): 1  min with RW 10 meter walk test: 0.27 m/s with RW    TODAY'S TREATMENT:   GAIT: Gait pattern: step through pattern, decreased stride length, decreased hip/knee flexion- Right, decreased ankle dorsiflexion- Right, and poor foot clearance- Right Distance walked: 115' (no AFO - loftstrands) + 230' (posterior ottobock - loftstrands) + 230'  (posterior ottobock - RW) + 115' (posterior townsend - RW) + 40' (posterior townsend - loftstrands) Assistive device utilized: Environmental consultant - 2 wheeled and Crutches Level of assistance:  Supervision Comments: Assessed gait with and without posterior Ottobock and Townsend AFOs today to determine best fit for patient at home with most notable improvement in Right toe clearance when using posterior Ottobock.  Pt demonstrates intermittent fatigue which was increased with use of Loftstrand crutches and noted by inc toe drag on the right.  Pt requires min cuing to initiate sequencing w/ loftstrands, but demonstrates good maintenance of sequence throughout.  He has mild shoulder hiking during limb advancement determined to not be due to height of crutches upon reassessment.   PATIENT EDUCATION: Education details: Continue HEP.  Discussed obtaining order and MD visit to justify AFO. Person educated: Patient Education method: Explanation Education comprehension: verbalized understanding   HOME EXERCISE PROGRAM: Access Code: [email or web address] URL: [email or web address] Date: 02/20/2022 Prepared by: Markus Jarvis  Exercises - Side Stepping with Counter Support  - 1 x daily - 7 x weekly - 2 sets - 10 reps - Sit to Stand with Counter Support  - 1 x daily - 7 x weekly - 10 reps   Added on 03/01/2022  Pt educated on trying L sidelying with hooklying position: R UE providing  AA hip flexion to R hip and then pt pushes leg into extension with UE resisting it. Pt educated to perform alternating AA flexion and resisted extension for 3 x 10 daily for HEP    GOALS: Goals reviewed with patient? Yes  SHORT TERM GOALS: Target date: 03/08/2022   (Remove Blue Hyperlink)  Patient will be able to ambulate 500' with RW without needing a rest break to improve functional walking endurance. Baseline:20 meters, 115' with RW (02/20/22); 230' wit RW (02/22/22) Goal status: Progressing continue  2.  Patient will get a  shower bench to improve his tub transfers while reducing risk for fall and reduce strain on his shoulders while getting in and out of the car.  Baseline: takes shower in tub Goal status: INITIAL  3.  Pt will be able to tolerate stand/walk activities or 10 min Baseline: 5 min Goal status: INITIAL  LONG TERM GOALS: Target date: 04/05/2022   (Remove Blue Hyperlink)  Patient will demo gait speed with walker of at least 0.58m/s to improve his functional ambulation status to limited community ambulator Baseline: 0.84m/s with RW (02/08/22) Goal status: INITIAL  2.  Pt will demo TUG score of <40 sec with RW to improve functional mobility Baseline: 1 min (02/08/22) Goal status: INITIAL  3.  Patient will be able to perform 5x sit to stand with use of bil UE support in <35 seconds to improve functional strength. Baseline: 47 seconds (01/29/22) Goal status: INITIAL  ASSESSMENT:  CLINICAL IMPRESSION: Focus of skilled session entirely on gait training with and without AFO with PT to recommend posterior ottobock AFO and obtain prescription for brace.  Pt tolerates several bouts of gait with loftstrand crutches vs RW to assess safety and mechanics w/ LRAD.  At this time his endurance is better with RW, but pt demonstrates safe sequencing of loftstrands today.  Will continue to progress towards LTGs as able.   OBJECTIVE IMPAIRMENTS Abnormal gait, decreased activity tolerance, decreased balance, decreased endurance, decreased mobility, difficulty walking, decreased ROM, decreased strength, hypomobility, increased fascial restrictions, impaired flexibility, impaired sensation, impaired tone, improper body mechanics, postural dysfunction, and pain.   ACTIVITY LIMITATIONS cleaning, community activity, driving, meal prep, laundry, and yard work.   PERSONAL FACTORS Age, Past/current experiences, and Time since onset of injury/illness/exacerbation are also affecting patient's functional outcome.    REHAB  POTENTIAL: Good  CLINICAL DECISION MAKING: Stable/uncomplicated  EVALUATION COMPLEXITY: Low  PLAN: PT FREQUENCY: 2x/week  PT DURATION: 8 weeks  PLANNED INTERVENTIONS: Therapeutic exercises, Therapeutic activity, Neuromuscular re-education, Balance training, Gait training, Patient/Family education, Joint mobilization, Stair training, Orthotic/Fit training, Aquatic Therapy, Spinal mobilization, Cryotherapy, Moist heat, and Manual therapy  PLAN FOR NEXT SESSION:  Assess STGs-date was incorrect last visit for 30 day update!  Continue forearm crutches and R posterior ottobock AFO (if MD visit/note available and order in Epic- fax order to [name]), core and glut strength-tall kneel holds and perturbations, reach to cones in quadruped, ball roll in quadruped, heel to tall kneeling w/ weighted ball to chest, standing balance no UE support  Bary Richard, PT, DPT 03/08/2022, 5:02 PM

## 2022-03-13 ENCOUNTER — Encounter: Payer: Self-pay | Admitting: Physical Therapy

## 2022-03-13 ENCOUNTER — Ambulatory Visit: Payer: BC Managed Care – PPO | Admitting: Physical Therapy

## 2022-03-13 DIAGNOSIS — R2689 Other abnormalities of gait and mobility: Secondary | ICD-10-CM | POA: Diagnosis not present

## 2022-03-13 DIAGNOSIS — R2681 Unsteadiness on feet: Secondary | ICD-10-CM

## 2022-03-13 DIAGNOSIS — M6281 Muscle weakness (generalized): Secondary | ICD-10-CM

## 2022-03-13 DIAGNOSIS — R262 Difficulty in walking, not elsewhere classified: Secondary | ICD-10-CM

## 2022-03-13 NOTE — Therapy (Signed)
OUTPATIENT PHYSICAL THERAPY TREATMENT NOTE   Patient Name: Maurice Cruz MRN: [id number] DOB:Feb 19, 1966, 56 y.o., male Today's Date: 03/13/2022  PCP: Dr. Glendon Axe REFERRING PROVIDER: Courtney Heys, MD    PT End of Session - 03/13/22 1401     Visit Number 8    Number of Visits 17    Date for PT Re-Evaluation 04/05/22    Authorization Type BCBS (60 visit limit annually)    PT Start Time 57    PT Stop Time 1445    PT Time Calculation (min) 46 min    Equipment Utilized During Treatment Gait belt    Activity Tolerance Patient tolerated treatment well    Behavior During Therapy WFL for tasks assessed/performed                Past Medical History:  Diagnosis Date   Anxiety    Asthma    as a child   Depression    ED (erectile dysfunction)    GERD (gastroesophageal reflux disease)    Hx of spinal cord injury 03/2020   Hypertension    Neurogenic bladder    self caths   Neurogenic bowel    has to be digitially stimulated - 04/24/21   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[place])    bilateral legs   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[employer])    Past Surgical History:  Procedure Laterality Date   Anorectal biospy  02/09/2021   Colon Biospy  02/09/2021   IR ANGIO INTRA EXTRACRAN SEL COM CAROTID INNOMINATE BILAT MOD SED  04/25/2021   IR ANGIO VERTEBRAL SEL SUBCLAVIAN INNOMINATE UNI L MOD SED  04/25/2021   IR ANGIO VERTEBRAL SEL VERTEBRAL UNI R MOD SED  04/25/2021   IR ANGIO/SPINAL LEFT  04/25/2021   IR ANGIO/SPINAL LEFT  04/25/2021   IR ANGIO/SPINAL LEFT  04/25/2021   IR ANGIO/SPINAL LEFT  04/25/2021   IR ANGIO/SPINAL LEFT  04/25/2021   IR ANGIO/SPINAL LEFT  04/25/2021   IR ANGIO/SPINAL LEFT  04/25/2021   IR ANGIO/SPINAL LEFT  04/25/2021   IR ANGIO/SPINAL LEFT  04/25/2021   IR ANGIO/SPINAL LEFT  04/25/2021   IR ANGIO/SPINAL LEFT  04/25/2021   IR ANGIO/SPINAL LEFT  04/25/2021   IR ANGIO/SPINAL LEFT  04/25/2021   IR ANGIO/SPINAL RIGHT  04/25/2021   IR ANGIO/SPINAL RIGHT  04/25/2021   IR ANGIO/SPINAL RIGHT  04/25/2021    IR ANGIO/SPINAL RIGHT  04/25/2021   IR ANGIO/SPINAL RIGHT  04/25/2021   IR ANGIO/SPINAL RIGHT  04/25/2021   IR ANGIO/SPINAL RIGHT  04/25/2021   IR ANGIO/SPINAL RIGHT  04/25/2021   IR ANGIO/SPINAL RIGHT  04/25/2021   IR ANGIO/SPINAL RIGHT  04/25/2021   IR ANGIO/SPINAL RIGHT  04/25/2021   IR ANGIO/SPINAL RIGHT  04/25/2021   IR ANGIO/SPINAL RIGHT  04/25/2021   IR ANGIO/SPINAL RIGHT  04/25/2021   IR ANGIOGRAM EXTREMITY BILATERAL  04/25/2021   IR RADIOLOGIST EVAL & MGMT  03/21/2021   RADIOLOGY WITH ANESTHESIA N/A 04/25/2021   Procedure: IR WITH ANESTHESIA SPINAL ANGIOGRAM;  Surgeon: Luanne Bras, MD;  Location: [facility];  Service: Radiology;  Laterality: N/A;   Patient Active Problem List   Diagnosis Date Noted   Disease of spinal cord ([place]) 05/21/2021   Anti-RNP antibodies present 05/21/2021   Weakness of both lower extremities 02/05/2021   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   Neurogenic bladder 12/08/2020   Neurogenic bowel 12/08/2020   Paraplegia following spinal cord injury ([place]) 12/08/2020   Wheelchair dependence 12/08/2020   Spasticity 12/08/2020   Chronic bilateral thoracic back pain 12/08/2020  ONSET DATE: 02/04/22 (date of referral)  REFERRING DIAG: G37.3 (ICD-10-CM) - 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[place]) G82.20 (ICD-10-CM) - Paraplegia following spinal cord injury ([place])   THERAPY DIAG:  Other abnormalities of gait and mobility  Muscle weakness (generalized)  Difficulty in walking, not elsewhere classified  Unsteadiness on feet  SUBJECTIVE:                                                                                                                                                                                              SUBJECTIVE STATEMENT: He fell yesterday when walking in his front yard in the grass and he feels like his feet got caught up. Pt accompanied by:  self  PERTINENT HISTORY: Transverse myelitis, neurogenic bowel and bladder  PAIN:  Are you having pain? Yes:  NPRS scale: 2-3/10 Pain location: midline low back Pain description: dull Aggravating factors: standing/walking,prolonged sitting, and lying Relieving factors: reposition rest  PRECAUTIONS: None  WEIGHT BEARING RESTRICTIONS No  FALLS: Has patient fallen in last 6 months? No  LIVING ENVIRONMENT: Lives with:  mother and father Lives in: House/apartment Stairs: Yes: Internal: 12 steps; on right going up and on left going up and External: 1 steps; none, pt has chair lift at home for stairs. Has following equipment at home: Gilford Rile - 2 wheeled, Wheelchair (manual), and bed side commode  PLOF:  Independent with dressing, self care, transfer, parents help with cooking, cleaning.  PATIENT GOALS Improve strength in R leg, improve walking  OBJECTIVE:   LE ROM:     Active  Right EVAL Left EVAL  Hip flexion    Hip extension    Hip abduction    Hip adduction    Hip internal rotation    Hip external rotation    Knee flexion    Knee extension    Ankle dorsiflexion    Ankle plantarflexion    Ankle inversion    Ankle eversion     (Blank rows = not tested)  MMT:    MMT Right EAVL Left EVAL  Hip flexion 1+ 3+  Hip extension    Hip abduction 1+ 3+  Hip adduction 1+ 3+  Hip internal rotation    Hip external rotation    Knee flexion 1+ 4  Knee extension 1+ 4  Ankle dorsiflexion 1+ 3+  Ankle plantarflexion 1+ 3+  Ankle inversion    Ankle eversion    (Blank rows = not tested)  Pt uses manual wheelchair when he goes to MD appts or stores. Inside the house he uses walker for household mobility.  FUNCTIONAL TESTs:  5 times sit to stand: 47 sec  Timed up and go (TUG): 1 min with RW 10 meter walk test: 0.27 m/s with RW  TODAY'S TREATMENT:  Time spent providing paper shorts to patient and pt changing due to incident prior to entering PT gym w/ pt pants soaked through.  Pt states he used the restroom just prior to PT.  PT SBA just outside door as pt requests privacy stating no  assistance needed for safe completion of task.  GAIT: Gait pattern: step through pattern, decreased stride length, decreased hip/knee flexion- Right, decreased ankle dorsiflexion- Right, and poor foot clearance- Right Distance walked: 503' Assistive device utilized: Environmental consultant - 2 wheeled and Crutches Level of assistance:  Supervision Comments: Pt ambulates until fatigue this session with w/c follow.  Min cues for hip flexor and hamstring engagement during RLE swing through w/ inc difficulty maintaining as distance increased.  NMR: Performed at [place] continuously for 10 min:  Bimanual cone unstacking with overhead reach to 2 shelves and return to countertop > lateral reach outside BOS to move cones left and right w/o UE support, LOB anteriorly x1 w/ pt self-correcting without UE support > static standing x 64min no UE support > standing w/ BUE support marching w/ modified RLE ROM > standing w/o UE support head nods and turns    PATIENT EDUCATION: Education details: Continue HEP.  Discussed obtaining order and MD visit to justify AFO. Person educated: Patient Education method: Explanation Education comprehension: verbalized understanding   HOME EXERCISE PROGRAM: Access Code: [email or web address] URL: [email or web address] Date: 02/20/2022 Prepared by: Markus Jarvis  Exercises - Side Stepping with Counter Support  - 1 x daily - 7 x weekly - 2 sets - 10 reps - Sit to Stand with Counter Support  - 1 x daily - 7 x weekly - 10 reps   Added on 03/01/2022  Pt educated on trying L sidelying with hooklying position: R UE providing  AA hip flexion to R hip and then pt pushes leg into extension with UE resisting it. Pt educated to perform alternating AA flexion and resisted extension for 3 x 10 daily for HEP    GOALS: Goals reviewed with patient? Yes  SHORT TERM GOALS: Target date: 03/08/2022   (Remove Blue Hyperlink)  Patient will be able to ambulate 500' with RW without needing a rest  break to improve functional walking endurance. Baseline:20 meters, 115' with RW (02/20/22); 230' wit RW (02/22/22); 503' w/ RW 03/13/2022 Goal status: MET  2.  Patient will get a shower bench to improve his tub transfers while reducing risk for fall and reduce strain on his shoulders while getting in and out of the car.  Baseline: takes shower in tub; pt states he has obtained a shower bench and is using it Goal status: MET  3.  Pt will be able to tolerate stand/walk activities or 10 min Baseline: 5 min;  Goal status: INITIAL  LONG TERM GOALS: Target date: 04/05/2022   (Remove Blue Hyperlink)  Patient will demo gait speed with walker of at least 0.80m/s to improve his functional ambulation status to limited community ambulator Baseline: 0.53m/s with RW (02/08/22) Goal status: INITIAL  2.  Pt will demo TUG score of <40 sec with RW to improve functional mobility Baseline: 1 min (02/08/22) Goal status: INITIAL  3.  Patient will be able to perform 5x sit to stand with use of bil UE support in <35 seconds to improve functional strength. Baseline: 47 seconds (01/29/22) Goal status: INITIAL  ASSESSMENT:  CLINICAL IMPRESSION: Session focused on standing  tolerance and balance as well as endurance of gait using RW.  Upon assessing short term goals pt is able to stand and perform balance activities continuously for 10 minutes and progressed gait to just over 500' w/o AFO using RW.  He has obtained a shower bench to assist with safe transfers for hygiene.  Will continue to address deficits per POC as pt is making progress towards LTGs.   OBJECTIVE IMPAIRMENTS Abnormal gait, decreased activity tolerance, decreased balance, decreased endurance, decreased mobility, difficulty walking, decreased ROM, decreased strength, hypomobility, increased fascial restrictions, impaired flexibility, impaired sensation, impaired tone, improper body mechanics, postural dysfunction, and pain.   ACTIVITY LIMITATIONS  cleaning, community activity, driving, meal prep, laundry, and yard work.   PERSONAL FACTORS Age, Past/current experiences, and Time since onset of injury/illness/exacerbation are also affecting patient's functional outcome.    REHAB POTENTIAL: Good  CLINICAL DECISION MAKING: Stable/uncomplicated  EVALUATION COMPLEXITY: Low  PLAN: PT FREQUENCY: 2x/week  PT DURATION: 8 weeks  PLANNED INTERVENTIONS: Therapeutic exercises, Therapeutic activity, Neuromuscular re-education, Balance training, Gait training, Patient/Family education, Joint mobilization, Stair training, Orthotic/Fit training, Aquatic Therapy, Spinal mobilization, Cryotherapy, Moist heat, and Manual therapy  PLAN FOR NEXT SESSION:  Has Dr. Dagoberto Ligas placed referral for AFO?  If not, may want to route conversation to PCP.  Continue forearm crutches and R posterior [place] AFO (if MD visit/note available and order in Epic- fax order to [name]), core and glut strength-tall kneel holds and perturbations, reach to cones in quadruped, ball roll in quadruped, heel to tall kneeling w/ weighted ball to chest, standing balance no UE support  Bary Richard, PT, DPT 03/13/2022, 4:19 PM

## 2022-03-15 ENCOUNTER — Ambulatory Visit: Payer: BC Managed Care – PPO

## 2022-03-15 DIAGNOSIS — M6281 Muscle weakness (generalized): Secondary | ICD-10-CM

## 2022-03-15 DIAGNOSIS — R2689 Other abnormalities of gait and mobility: Secondary | ICD-10-CM

## 2022-03-15 DIAGNOSIS — R262 Difficulty in walking, not elsewhere classified: Secondary | ICD-10-CM

## 2022-03-15 NOTE — Therapy (Signed)
OUTPATIENT PHYSICAL THERAPY TREATMENT NOTE   Patient Name: ERCEL PEPITONE MRN: [id number] DOB:[birth year], 56 y.o., male Today's Date: 03/15/2022  PCP: Dr. Glendon Axe REFERRING PROVIDER: Courtney Heys, MD    PT End of Session - 03/15/22 1234     Visit Number 9    Number of Visits 17    Date for PT Re-Evaluation 04/05/22    Authorization Type BCBS (60 visit limit annually)    PT Start Time 36    PT Stop Time 1315    PT Time Calculation (min) 45 min    Equipment Utilized During Treatment Gait belt    Activity Tolerance Patient tolerated treatment well    Behavior During Therapy WFL for tasks assessed/performed                Past Medical History:  Diagnosis Date   Anxiety    Asthma    as a child   Depression    ED (erectile dysfunction)    GERD (gastroesophageal reflux disease)    Hx of spinal cord injury 03/2020   Hypertension    Neurogenic bladder    self caths   Neurogenic bowel    has to be digitially stimulated - 04/24/21   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[place])    bilateral legs   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[place])    Past Surgical History:  Procedure Laterality Date   Anorectal biospy  02/09/2021   Colon Biospy  02/09/2021   IR ANGIO INTRA EXTRACRAN SEL COM CAROTID INNOMINATE BILAT MOD SED  04/25/2021   IR ANGIO VERTEBRAL SEL SUBCLAVIAN INNOMINATE UNI L MOD SED  04/25/2021   IR ANGIO VERTEBRAL SEL VERTEBRAL UNI R MOD SED  04/25/2021   IR ANGIO/SPINAL LEFT  04/25/2021   IR ANGIO/SPINAL LEFT  04/25/2021   IR ANGIO/SPINAL LEFT  04/25/2021   IR ANGIO/SPINAL LEFT  04/25/2021   IR ANGIO/SPINAL LEFT  04/25/2021   IR ANGIO/SPINAL LEFT  04/25/2021   IR ANGIO/SPINAL LEFT  04/25/2021   IR ANGIO/SPINAL LEFT  04/25/2021   IR ANGIO/SPINAL LEFT  04/25/2021   IR ANGIO/SPINAL LEFT  04/25/2021   IR ANGIO/SPINAL LEFT  04/25/2021   IR ANGIO/SPINAL LEFT  04/25/2021   IR ANGIO/SPINAL LEFT  04/25/2021   IR ANGIO/SPINAL RIGHT  04/25/2021   IR ANGIO/SPINAL RIGHT  04/25/2021   IR ANGIO/SPINAL RIGHT  04/25/2021    IR ANGIO/SPINAL RIGHT  04/25/2021   IR ANGIO/SPINAL RIGHT  04/25/2021   IR ANGIO/SPINAL RIGHT  04/25/2021   IR ANGIO/SPINAL RIGHT  04/25/2021   IR ANGIO/SPINAL RIGHT  04/25/2021   IR ANGIO/SPINAL RIGHT  04/25/2021   IR ANGIO/SPINAL RIGHT  04/25/2021   IR ANGIO/SPINAL RIGHT  04/25/2021   IR ANGIO/SPINAL RIGHT  04/25/2021   IR ANGIO/SPINAL RIGHT  04/25/2021   IR ANGIO/SPINAL RIGHT  04/25/2021   IR ANGIOGRAM EXTREMITY BILATERAL  04/25/2021   IR RADIOLOGIST EVAL & MGMT  03/21/2021   RADIOLOGY WITH ANESTHESIA N/A 04/25/2021   Procedure: IR WITH ANESTHESIA SPINAL ANGIOGRAM;  Surgeon: Luanne Bras, MD;  Location: [facility];  Service: Radiology;  Laterality: N/A;   Patient Active Problem List   Diagnosis Date Noted   Disease of spinal cord ([place]) 05/21/2021   Anti-RNP antibodies present 05/21/2021   Weakness of both lower extremities 02/05/2021   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   Neurogenic bladder 12/08/2020   Neurogenic bowel 12/08/2020   Paraplegia following spinal cord injury ([place]) 12/08/2020   Wheelchair dependence 12/08/2020   Spasticity 12/08/2020   Chronic bilateral thoracic back pain 12/08/2020  ONSET DATE: 02/04/22 (date of referral)  REFERRING DIAG: G37.3 (ICD-10-CM) - 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[place]) G82.20 (ICD-10-CM) - Paraplegia following spinal cord injury ([place])   THERAPY DIAG:  Other abnormalities of gait and mobility  Muscle weakness (generalized)  Difficulty in walking, not elsewhere classified  SUBJECTIVE:                                                                                                                                                                                              SUBJECTIVE STATEMENT: No new complaints. No falls since recent fall a week ago. Pt accompanied by:  self  PERTINENT HISTORY: Transverse myelitis, neurogenic bowel and bladder  PAIN:  Are you having pain? NO  PRECAUTIONS: None  WEIGHT BEARING RESTRICTIONS No  FALLS: Has  patient fallen in last 6 months? No  LIVING ENVIRONMENT: Lives with:  mother and father Lives in: House/apartment Stairs: Yes: Internal: 12 steps; on right going up and on left going up and External: 1 steps; none, pt has chair lift at home for stairs. Has following equipment at home: Gilford Rile - 2 wheeled, Wheelchair (manual), and bed side commode  PLOF:  Independent with dressing, self care, transfer, parents help with cooking, cleaning.  PATIENT GOALS Improve strength in R leg, improve walking  OBJECTIVE:   LE ROM:     Active  Right EVAL Left EVAL  Hip flexion    Hip extension    Hip abduction    Hip adduction    Hip internal rotation    Hip external rotation    Knee flexion    Knee extension    Ankle dorsiflexion    Ankle plantarflexion    Ankle inversion    Ankle eversion     (Blank rows = not tested)  MMT:    MMT Right EAVL Left EVAL  Hip flexion 1+ 3+  Hip extension    Hip abduction 1+ 3+  Hip adduction 1+ 3+  Hip internal rotation    Hip external rotation    Knee flexion 1+ 4  Knee extension 1+ 4  Ankle dorsiflexion 1+ 3+  Ankle plantarflexion 1+ 3+  Ankle inversion    Ankle eversion    (Blank rows = not tested)  Pt uses manual wheelchair when he goes to MD appts or stores. Inside the house he uses walker for household mobility.  FUNCTIONAL TESTs:  5 times sit to stand: 47 sec Timed up and go (TUG): 1 min with RW 10 meter walk test: 0.27 m/s with RW  TODAY'S TREATMENT:  L sidelying: AA hip flexion with manually resisted leg press to R leg: 3  x 10, long axis compression to R hip provided through R femur to improve hip stability during hip flexion Practiced limits of stability: with biodex harness for safety Forward reach: R and L hand 2 x 10 each, pt educate don not relying on harness to figure out his limits of stability, practicing to bend the knees slightly when reaching forward. Catching swiss ball in multiple directions Gait training: 1 x 115'  with 2 loftstrand crutches. Discussed 2 pt gait pattern but difficuty with coordination so we did 4 point gait pattern with it. CGA and required min A one time due to mild LOB. Pt educated on keeping feet apart as he steps forward and then discussed anterior trunk lean with forward step to improve his step length and to reduce posterior lean during swing phase. Reviewed L sidelying R AA hip flexion exercise for HEP  PATIENT EDUCATION: Education details: Continue HEP.  Discussed obtaining order and MD visit to justify AFO. Person educated: Patient Education method: Explanation Education comprehension: verbalized understanding   HOME EXERCISE PROGRAM: Access Code: [email or web address] URL: [email or web address] Date: 02/20/2022 Prepared by: Markus Jarvis  Exercises - Side Stepping with Counter Support  - 1 x daily - 7 x weekly - 2 sets - 10 reps - Sit to Stand with Counter Support  - 1 x daily - 7 x weekly - 10 reps   Added on 03/01/2022  Pt educated on trying L sidelying with hooklying position: R UE providing  AA hip flexion to R hip and then pt pushes leg into extension with UE resisting it. Pt educated to perform alternating AA flexion and resisted extension for 3 x 10 daily for HEP    GOALS: Goals reviewed with patient? Yes  SHORT TERM GOALS: Target date: 03/08/2022   (Remove Blue Hyperlink)  Patient will be able to ambulate 500' with RW without needing a rest break to improve functional walking endurance. Baseline:20 meters, 115' with RW (02/20/22); 230' wit RW (02/22/22); 503' w/ RW 03/13/2022 Goal status: MET  2.  Patient will get a shower bench to improve his tub transfers while reducing risk for fall and reduce strain on his shoulders while getting in and out of the car.  Baseline: takes shower in tub; pt states he has obtained a shower bench and is using it Goal status: MET  3.  Pt will be able to tolerate stand/walk activities or 10 min Baseline: 5 min;  Goal status:  INITIAL  LONG TERM GOALS: Target date: 04/05/2022   (Remove Blue Hyperlink)  Patient will demo gait speed with walker of at least 0.73m/s to improve his functional ambulation status to limited community ambulator Baseline: 0.79m/s with RW (02/08/22) Goal status: INITIAL  2.  Pt will demo TUG score of <40 sec with RW to improve functional mobility Baseline: 1 min (02/08/22) Goal status: INITIAL  3.  Patient will be able to perform 5x sit to stand with use of bil UE support in <35 seconds to improve functional strength. Baseline: 47 seconds (01/29/22) Goal status: INITIAL  ASSESSMENT:  CLINICAL IMPRESSION: Pt relied moderately on harness with 2 LOB with forward reach when practicing limits of stability indicating poor standing dynamic balance. Pt will require further training with gait with LRAD   OBJECTIVE IMPAIRMENTS Abnormal gait, decreased activity tolerance, decreased balance, decreased endurance, decreased mobility, difficulty walking, decreased ROM, decreased strength, hypomobility, increased fascial restrictions, impaired flexibility, impaired sensation, impaired tone, improper body mechanics, postural dysfunction, and pain.   ACTIVITY LIMITATIONS cleaning, community activity, driving,  meal prep, laundry, and yard work.   PERSONAL FACTORS Age, Past/current experiences, and Time since onset of injury/illness/exacerbation are also affecting patient's functional outcome.    REHAB POTENTIAL: Good  CLINICAL DECISION MAKING: Stable/uncomplicated  EVALUATION COMPLEXITY: Low  PLAN: PT FREQUENCY: 2x/week  PT DURATION: 8 weeks  PLANNED INTERVENTIONS: Therapeutic exercises, Therapeutic activity, Neuromuscular re-education, Balance training, Gait training, Patient/Family education, Joint mobilization, Stair training, Orthotic/Fit training, Aquatic Therapy, Spinal mobilization, Cryotherapy, Moist heat, and Manual therapy  PLAN FOR NEXT SESSION:  Has Dr. Dagoberto Ligas placed referral for AFO?   Continue to work with loft strand crutches and work on standing balance as needed.  Kerrie Pleasure, PT, DPT 03/15/2022, 1:17 PM

## 2022-03-20 ENCOUNTER — Ambulatory Visit: Payer: BC Managed Care – PPO

## 2022-03-20 DIAGNOSIS — R262 Difficulty in walking, not elsewhere classified: Secondary | ICD-10-CM

## 2022-03-20 DIAGNOSIS — R2689 Other abnormalities of gait and mobility: Secondary | ICD-10-CM

## 2022-03-20 DIAGNOSIS — M6281 Muscle weakness (generalized): Secondary | ICD-10-CM

## 2022-03-20 DIAGNOSIS — R2681 Unsteadiness on feet: Secondary | ICD-10-CM

## 2022-03-20 NOTE — Therapy (Signed)
OUTPATIENT PHYSICAL THERAPY TREATMENT NOTE   Patient Name: Maurice Cruz MRN: [id number] DOB:[birth year], 56 y.o., male Today's Date: 03/20/2022  PCP: Dr. Glendon Axe REFERRING PROVIDER: Courtney Heys, MD    PT End of Session - 03/20/22 1247     Visit Number 10    Number of Visits 17    Date for PT Re-Evaluation 04/05/22    Authorization Type BC[employer] (60 visit limit annually)    PT Start Time 66    PT Stop Time 64    PT Time Calculation (min) 45 min    Equipment Utilized During Treatment Gait belt    Activity Tolerance Patient tolerated treatment well    Behavior During Therapy WFL for tasks assessed/performed                Past Medical History:  Diagnosis Date   Anxiety    Asthma    as a child   Depression    ED (erectile dysfunction)    GERD (gastroesophageal reflux disease)    Hx of spinal cord injury 03/2020   Hypertension    Neurogenic bladder    self caths   Neurogenic bowel    has to be digitially stimulated - 04/24/21   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[place])    bilateral legs   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[place])    Past Surgical History:  Procedure Laterality Date   Anorectal biospy  02/09/2021   Colon Biospy  02/09/2021   IR ANGIO INTRA EXTRACRAN SEL COM CAROTID INNOMINATE BILAT MOD SED  04/25/2021   IR ANGIO VERTEBRAL SEL SUBCLAVIAN INNOMINATE UNI L MOD SED  04/25/2021   IR ANGIO VERTEBRAL SEL VERTEBRAL UNI R MOD SED  04/25/2021   IR ANGIO/SPINAL LEFT  04/25/2021   IR ANGIO/SPINAL LEFT  04/25/2021   IR ANGIO/SPINAL LEFT  04/25/2021   IR ANGIO/SPINAL LEFT  04/25/2021   IR ANGIO/SPINAL LEFT  04/25/2021   IR ANGIO/SPINAL LEFT  04/25/2021   IR ANGIO/SPINAL LEFT  04/25/2021   IR ANGIO/SPINAL LEFT  04/25/2021   IR ANGIO/SPINAL LEFT  04/25/2021   IR ANGIO/SPINAL LEFT  04/25/2021   IR ANGIO/SPINAL LEFT  04/25/2021   IR ANGIO/SPINAL LEFT  04/25/2021   IR ANGIO/SPINAL LEFT  04/25/2021   IR ANGIO/SPINAL RIGHT  04/25/2021   IR ANGIO/SPINAL RIGHT  04/25/2021   IR ANGIO/SPINAL RIGHT  04/25/2021    IR ANGIO/SPINAL RIGHT  04/25/2021   IR ANGIO/SPINAL RIGHT  04/25/2021   IR ANGIO/SPINAL RIGHT  04/25/2021   IR ANGIO/SPINAL RIGHT  04/25/2021   IR ANGIO/SPINAL RIGHT  04/25/2021   IR ANGIO/SPINAL RIGHT  04/25/2021   IR ANGIO/SPINAL RIGHT  04/25/2021   IR ANGIO/SPINAL RIGHT  04/25/2021   IR ANGIO/SPINAL RIGHT  04/25/2021   IR ANGIO/SPINAL RIGHT  04/25/2021   IR ANGIO/SPINAL RIGHT  04/25/2021   IR ANGIOGRAM EXTREMITY BILATERAL  04/25/2021   IR RADIOLOGIST EVAL & MGMT  03/21/2021   RADIOLOGY WITH ANESTHESIA N/A 04/25/2021   Procedure: IR WITH ANESTHESIA SPINAL ANGIOGRAM;  Surgeon: Luanne Bras, MD;  Location: [facility];  Service: Radiology;  Laterality: N/A;   Patient Active Problem List   Diagnosis Date Noted   Disease of spinal cord ([place]) 05/21/2021   Anti-RNP antibodies present 05/21/2021   Weakness of both lower extremities 02/05/2021   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   Neurogenic bladder 12/08/2020   Neurogenic bowel 12/08/2020   Paraplegia following spinal cord injury ([place]) 12/08/2020   Wheelchair dependence 12/08/2020   Spasticity 12/08/2020   Chronic bilateral thoracic back pain 12/08/2020  ONSET DATE: 02/04/22 (date of referral)  REFERRING DIAG: G37.3 (ICD-10-CM) - 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[place]) G82.20 (ICD-10-CM) - Paraplegia following spinal cord injury ([place])   THERAPY DIAG:  Other abnormalities of gait and mobility  Muscle weakness (generalized)  Difficulty in walking, not elsewhere classified  Unsteadiness on feet  SUBJECTIVE:                                                                                                                                                                                              SUBJECTIVE STATEMENT: I have friends and family telling me that I am walking better.  Pt accompanied by:  self  PERTINENT HISTORY: Transverse myelitis, neurogenic bowel and bladder  PAIN:  Are you having pain? NO  PRECAUTIONS: None  WEIGHT BEARING  RESTRICTIONS No  FALLS: Has patient fallen in last 6 months? No  LIVING ENVIRONMENT: Lives with:  mother and father Lives in: House/apartment Stairs: Yes: Internal: 12 steps; on right going up and on left going up and External: 1 steps; none, pt has chair lift at home for stairs. Has following equipment at home: Gilford Rile - 2 wheeled, Wheelchair (manual), and bed side commode  PLOF:  Independent with dressing, self care, transfer, parents help with cooking, cleaning.  PATIENT GOALS Improve strength in R leg, improve walking  OBJECTIVE:   LE ROM:     Active  Right EVAL Left EVAL  Hip flexion    Hip extension    Hip abduction    Hip adduction    Hip internal rotation    Hip external rotation    Knee flexion    Knee extension    Ankle dorsiflexion    Ankle plantarflexion    Ankle inversion    Ankle eversion     (Blank rows = not tested)  MMT:    MMT Right EAVL Left EVAL  Hip flexion 1+ 3+  Hip extension    Hip abduction 1+ 3+  Hip adduction 1+ 3+  Hip internal rotation    Hip external rotation    Knee flexion 1+ 4  Knee extension 1+ 4  Ankle dorsiflexion 1+ 3+  Ankle plantarflexion 1+ 3+  Ankle inversion    Ankle eversion    (Blank rows = not tested)  Pt uses manual wheelchair when he goes to MD appts or stores. Inside the house he uses walker for household mobility.  FUNCTIONAL TESTs:  5 times sit to stand: 47 sec Timed up and go (TUG): 1 min with RW 10 meter walk test: 0.27 m/s with RW  TODAY'S TREATMENT:  Neuro re-ed: Sidelying ipsilateral elbow and knee curl  ups: manually resisted for R side and AA for L: 2 x 10 Sidelying to modified plank position: 10x R and L Supine bridge: 2 x 10 Practiced limits of stability: with biodex harness for safety Reaching to the floor by gliding hands down knees; cues to keep knees apart to engage the hips: 2 x 10 Split stance with Chest press with trunk twists with green band with bil UE: 10x R and L  PATIENT  EDUCATION: Education details: Continue HEP.  Discussed obtaining order and MD visit to justify AFO. Person educated: Patient Education method: Explanation Education comprehension: verbalized understanding   HOME EXERCISE PROGRAM: Access Code: [email or web address] URL: [email or web address] Date: 02/20/2022 Prepared by: Markus Jarvis  Exercises - Side Stepping with Counter Support  - 1 x daily - 7 x weekly - 2 sets - 10 reps - Sit to Stand with Counter Support  - 1 x daily - 7 x weekly - 10 reps   Added on 03/01/2022  Pt educated on trying L sidelying with hooklying position: R UE providing  AA hip flexion to R hip and then pt pushes leg into extension with UE resisting it. Pt educated to perform alternating AA flexion and resisted extension for 3 x 10 daily for HEP    GOALS: Goals reviewed with patient? Yes  SHORT TERM GOALS: Target date: 03/08/2022   (Remove Blue Hyperlink)  Patient will be able to ambulate 500' with RW without needing a rest break to improve functional walking endurance. Baseline:20 meters, 115' with RW (02/20/22); 230' wit RW (02/22/22); 503' w/ RW 03/13/2022 Goal status: MET  2.  Patient will get a shower bench to improve his tub transfers while reducing risk for fall and reduce strain on his shoulders while getting in and out of the car.  Baseline: takes shower in tub; pt states he has obtained a shower bench and is using it Goal status: MET  3.  Pt will be able to tolerate stand/walk activities or 10 min Baseline: 5 min;  Goal status: INITIAL  LONG TERM GOALS: Target date: 04/05/2022   (Remove Blue Hyperlink)  Patient will demo gait speed with walker of at least 0.79m/s to improve his functional ambulation status to limited community ambulator Baseline: 0.25m/s with RW (02/08/22) Goal status: INITIAL  2.  Pt will demo TUG score of <40 sec with RW to improve functional mobility Baseline: 1 min (02/08/22) Goal status: INITIAL  3.  Patient will be able to  perform 5x sit to stand with use of bil UE support in <35 seconds to improve functional strength. Baseline: 47 seconds (01/29/22) Goal status: INITIAL  ASSESSMENT:  CLINICAL IMPRESSION: Pt is reporting improving ability to ambulate with more ease. Today's session was focused on continued hip/pelvis control for stability and increasing WB on bil LE with functional movements.   OBJECTIVE IMPAIRMENTS Abnormal gait, decreased activity tolerance, decreased balance, decreased endurance, decreased mobility, difficulty walking, decreased ROM, decreased strength, hypomobility, increased fascial restrictions, impaired flexibility, impaired sensation, impaired tone, improper body mechanics, postural dysfunction, and pain.   ACTIVITY LIMITATIONS cleaning, community activity, driving, meal prep, laundry, and yard work.   PERSONAL FACTORS Age, Past/current experiences, and Time since onset of injury/illness/exacerbation are also affecting patient's functional outcome.    REHAB POTENTIAL: Good  CLINICAL DECISION MAKING: Stable/uncomplicated  EVALUATION COMPLEXITY: Low  PLAN: PT FREQUENCY: 2x/week  PT DURATION: 8 weeks  PLANNED INTERVENTIONS: Therapeutic exercises, Therapeutic activity, Neuromuscular re-education, Balance training, Gait training, Patient/Family education, Joint mobilization, Stair training, Orthotic/Fit training, Aquatic Therapy,  Spinal mobilization, Cryotherapy, Moist heat, and Manual therapy  PLAN FOR NEXT SESSION:  Has Dr. Dagoberto Ligas placed referral for AFO?  Continue to work with loft strand crutches and work on standing balance as needed.  Kerrie Pleasure, PT, DPT 03/20/2022, 1:20 PM

## 2022-03-22 ENCOUNTER — Ambulatory Visit: Payer: BC Managed Care – PPO

## 2022-03-22 DIAGNOSIS — R2681 Unsteadiness on feet: Secondary | ICD-10-CM

## 2022-03-22 DIAGNOSIS — R262 Difficulty in walking, not elsewhere classified: Secondary | ICD-10-CM

## 2022-03-22 DIAGNOSIS — R2689 Other abnormalities of gait and mobility: Secondary | ICD-10-CM | POA: Diagnosis not present

## 2022-03-22 DIAGNOSIS — M6281 Muscle weakness (generalized): Secondary | ICD-10-CM

## 2022-03-24 ENCOUNTER — Encounter: Payer: Self-pay | Admitting: Physical Medicine and Rehabilitation

## 2022-03-27 ENCOUNTER — Encounter: Payer: Self-pay | Admitting: Physical Therapy

## 2022-03-27 ENCOUNTER — Ambulatory Visit: Payer: BC Managed Care – PPO | Admitting: Physical Therapy

## 2022-03-27 DIAGNOSIS — R2681 Unsteadiness on feet: Secondary | ICD-10-CM

## 2022-03-27 DIAGNOSIS — R2689 Other abnormalities of gait and mobility: Secondary | ICD-10-CM

## 2022-03-27 DIAGNOSIS — R262 Difficulty in walking, not elsewhere classified: Secondary | ICD-10-CM

## 2022-03-27 DIAGNOSIS — M6281 Muscle weakness (generalized): Secondary | ICD-10-CM

## 2022-03-27 NOTE — Therapy (Signed)
OUTPATIENT PHYSICAL THERAPY TREATMENT NOTE   Patient Name: Maurice Cruz MRN: [id number] DOB:12-08-1965, 56 y.o., male Today's Date: 03/27/2022  PCP: Dr. Glendon Axe REFERRING PROVIDER: Courtney Heys, MD    PT End of Session - 03/27/22 1404     Visit Number 12    Number of Visits 17    Date for PT Re-Evaluation 04/05/22    Authorization Type [employer] (60 visit limit annually)    PT Start Time 50    PT Stop Time 1445    PT Time Calculation (min) 44 min    Equipment Utilized During Treatment Gait belt    Activity Tolerance Patient tolerated treatment well    Behavior During Therapy WFL for tasks assessed/performed                 Past Medical History:  Diagnosis Date   Anxiety    Asthma    as a child   Depression    ED (erectile dysfunction)    GERD (gastroesophageal reflux disease)    Hx of spinal cord injury 03/2020   Hypertension    Neurogenic bladder    self caths   Neurogenic bowel    has to be digitially stimulated - 04/24/21   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[place])    bilateral legs   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[place])    Past Surgical History:  Procedure Laterality Date   Anorectal biospy  02/09/2021   Colon Biospy  02/09/2021   IR ANGIO INTRA EXTRACRAN SEL COM CAROTID INNOMINATE BILAT MOD SED  04/25/2021   IR ANGIO VERTEBRAL SEL SUBCLAVIAN INNOMINATE UNI L MOD SED  04/25/2021   IR ANGIO VERTEBRAL SEL VERTEBRAL UNI R MOD SED  04/25/2021   IR ANGIO/SPINAL LEFT  04/25/2021   IR ANGIO/SPINAL LEFT  04/25/2021   IR ANGIO/SPINAL LEFT  04/25/2021   IR ANGIO/SPINAL LEFT  04/25/2021   IR ANGIO/SPINAL LEFT  04/25/2021   IR ANGIO/SPINAL LEFT  04/25/2021   IR ANGIO/SPINAL LEFT  04/25/2021   IR ANGIO/SPINAL LEFT  04/25/2021   IR ANGIO/SPINAL LEFT  04/25/2021   IR ANGIO/SPINAL LEFT  04/25/2021   IR ANGIO/SPINAL LEFT  04/25/2021   IR ANGIO/SPINAL LEFT  04/25/2021   IR ANGIO/SPINAL LEFT  04/25/2021   IR ANGIO/SPINAL RIGHT  04/25/2021   IR ANGIO/SPINAL RIGHT  04/25/2021   IR ANGIO/SPINAL RIGHT  04/25/2021    IR ANGIO/SPINAL RIGHT  04/25/2021   IR ANGIO/SPINAL RIGHT  04/25/2021   IR ANGIO/SPINAL RIGHT  04/25/2021   IR ANGIO/SPINAL RIGHT  04/25/2021   IR ANGIO/SPINAL RIGHT  04/25/2021   IR ANGIO/SPINAL RIGHT  04/25/2021   IR ANGIO/SPINAL RIGHT  04/25/2021   IR ANGIO/SPINAL RIGHT  04/25/2021   IR ANGIO/SPINAL RIGHT  04/25/2021   IR ANGIO/SPINAL RIGHT  04/25/2021   IR ANGIO/SPINAL RIGHT  04/25/2021   IR ANGIOGRAM EXTREMITY BILATERAL  04/25/2021   IR RADIOLOGIST EVAL & MGMT  03/21/2021   RADIOLOGY WITH ANESTHESIA N/A 04/25/2021   Procedure: IR WITH ANESTHESIA SPINAL ANGIOGRAM;  Surgeon: Luanne Bras, MD;  Location: [facility];  Service: Radiology;  Laterality: N/A;   Patient Active Problem List   Diagnosis Date Noted   Disease of spinal cord ([place]) 05/21/2021   Anti-RNP antibodies present 05/21/2021   Weakness of both lower extremities 02/05/2021   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   Neurogenic bladder 12/08/2020   Neurogenic bowel 12/08/2020   Paraplegia following spinal cord injury ([place]) 12/08/2020   Wheelchair dependence 12/08/2020   Spasticity 12/08/2020   Chronic bilateral thoracic back pain  12/08/2020    ONSET DATE: 02/04/22 (date of referral)  REFERRING DIAG: G37.3 (ICD-10-CM) - 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[place]) G82.20 (ICD-10-CM) - Paraplegia following spinal cord injury ([place])   THERAPY DIAG:  Other abnormalities of gait and mobility  Muscle weakness (generalized)  Difficulty in walking, not elsewhere classified  Unsteadiness on feet  SUBJECTIVE:                                                                                                                                                                                              SUBJECTIVE STATEMENT: He has not purchased loftstrand crutches yet as he is still deciding if he wants to.  He has not fallen at home and continues to work on standing balance and walking with the walker at home.  Pt accompanied by: family member and self  -  dad is present today  PERTINENT HISTORY: Transverse myelitis, neurogenic bowel and bladder  PAIN:  Are you having pain? NO  PRECAUTIONS: None  WEIGHT BEARING RESTRICTIONS No  FALLS: Has patient fallen in last 6 months? No  LIVING ENVIRONMENT: Lives with:  mother and father Lives in: House/apartment Stairs: Yes: Internal: 12 steps; on right going up and on left going up and External: 1 steps; none, pt has chair lift at home for stairs. Has following equipment at home: Gilford Rile - 2 wheeled, Wheelchair (manual), and bed side commode  PLOF:  Independent with dressing, self care, transfer, parents help with cooking, cleaning.  PATIENT GOALS Improve strength in R leg, improve walking  OBJECTIVE:   LE ROM:     Active  Right EVAL Left EVAL  Hip flexion    Hip extension    Hip abduction    Hip adduction    Hip internal rotation    Hip external rotation    Knee flexion    Knee extension    Ankle dorsiflexion    Ankle plantarflexion    Ankle inversion    Ankle eversion     (Blank rows = not tested)  MMT:    MMT Right EAVL Left EVAL  Hip flexion 1+ 3+  Hip extension    Hip abduction 1+ 3+  Hip adduction 1+ 3+  Hip internal rotation    Hip external rotation    Knee flexion 1+ 4  Knee extension 1+ 4  Ankle dorsiflexion 1+ 3+  Ankle plantarflexion 1+ 3+  Ankle inversion    Ankle eversion    (Blank rows = not tested)  Pt uses manual wheelchair when he goes to MD appts or stores. Inside the house he uses walker for household mobility.  FUNCTIONAL TESTs:  5 times sit to stand: 47 sec Timed up and go (TUG): 1 min with RW 10 meter walk test: 0.27 m/s with RW  TODAY'S TREATMENT:  Standing at counter x2 min no UE support Standing at counter in tandem on firm surface 2x45 sec alt LE no UE support Standing at EOM in tandem on foam 2x45 sec alt LE no UE support Standing EOM on foam feet together EO x45 sec > EC x45 sec > head turns/nods x45 sec each In // bars:  standing on tilt board holding level x79min no UE support > forward and backwards rocking x1 min no UE support w/in shortened ROM > step ups using BUE support onto soft BOSU x5 RLE w/ inc time to engage hip and knee flexors and x 8 on LLE Pt ambulates in // bars x40' continuously w/o UE support progressed from minA to SBA + 15' back to mat w/ SBA no AD  PATIENT EDUCATION: Education details: Continue HEP.  Provided business card for [employer]. Person educated: Patient Education method: Explanation Education comprehension: verbalized understanding   HOME EXERCISE PROGRAM: Access Code: [email or web address] URL: [email or web address] Date: 02/20/2022 Prepared by: Markus Jarvis  Exercises - Side Stepping with Counter Support  - 1 x daily - 7 x weekly - 2 sets - 10 reps - Sit to Stand with Counter Support  - 1 x daily - 7 x weekly - 10 reps   Added on 03/01/2022  Pt educated on trying L sidelying with hooklying position: R UE providing  AA hip flexion to R hip and then pt pushes leg into extension with UE resisting it. Pt educated to perform alternating AA flexion and resisted extension for 3 x 10 daily for HEP    GOALS: Goals reviewed with patient? Yes  SHORT TERM GOALS: Target date: 03/08/2022   (Remove Blue Hyperlink)  Patient will be able to ambulate 500' with RW without needing a rest break to improve functional walking endurance. Baseline:20 meters, 115' with RW (02/20/22); 230' wit RW (02/22/22); 503' w/ RW 03/13/2022 Goal status: MET  2.  Patient will get a shower bench to improve his tub transfers while reducing risk for fall and reduce strain on his shoulders while getting in and out of the car.  Baseline: takes shower in tub; pt states he has obtained a shower bench and is using it Goal status: MET  3.  Pt will be able to tolerate stand/walk activities or 10 min Baseline: 5 min; see treatment note from 03/08/22 Goal status: MET  LONG TERM GOALS: Target date: 04/05/2022   (Remove  Blue Hyperlink)  Patient will demo gait speed with walker of at least 0.17m/s to improve his functional ambulation status to limited community ambulator Baseline: 0.42m/s with RW (02/08/22) Goal status: INITIAL  2.  Pt will demo TUG score of <40 sec with RW to improve functional mobility Baseline: 1 min (02/08/22) Goal status: INITIAL  3.  Patient will be able to perform 5x sit to stand with use of bil UE support in <35 seconds to improve functional strength. Baseline: 47 seconds (01/29/22) Goal status: INITIAL  ASSESSMENT:  CLINICAL IMPRESSION: Focus of skilled session on continuing to challenge static balance without UE support on compliant surfaces.  Initiated gait training w/o AD and progressed from minA to facilitate weight shift to SBA.  Pt is progressing well towards LTGs and continues to benefit from skilled PT to optimize functional outcomes.   OBJECTIVE IMPAIRMENTS Abnormal gait, decreased activity tolerance, decreased balance, decreased endurance, decreased  mobility, difficulty walking, decreased ROM, decreased strength, hypomobility, increased fascial restrictions, impaired flexibility, impaired sensation, impaired tone, improper body mechanics, postural dysfunction, and pain.   ACTIVITY LIMITATIONS cleaning, community activity, driving, meal prep, laundry, and yard work.   PERSONAL FACTORS Age, Past/current experiences, and Time since onset of injury/illness/exacerbation are also affecting patient's functional outcome.    REHAB POTENTIAL: Good  CLINICAL DECISION MAKING: Stable/uncomplicated  EVALUATION COMPLEXITY: Low  PLAN: PT FREQUENCY: 2x/week  PT DURATION: 8 weeks  PLANNED INTERVENTIONS: Therapeutic exercises, Therapeutic activity, Neuromuscular re-education, Balance training, Gait training, Patient/Family education, Joint mobilization, Stair training, Orthotic/Fit training, Aquatic Therapy, Spinal mobilization, Cryotherapy, Moist heat, and Manual therapy  PLAN FOR  NEXT SESSION:  Progress HEP.  Continue to work with loft strand crutches and work on standing balance as needed.  Progress gait w/o AD.  Bary Richard, PT, DPT 03/27/2022, 4:18 PM

## 2022-03-29 ENCOUNTER — Ambulatory Visit: Payer: BC Managed Care – PPO | Admitting: Physical Therapy

## 2022-03-29 ENCOUNTER — Encounter: Payer: Self-pay | Admitting: Physical Therapy

## 2022-03-29 DIAGNOSIS — R262 Difficulty in walking, not elsewhere classified: Secondary | ICD-10-CM

## 2022-03-29 DIAGNOSIS — R293 Abnormal posture: Secondary | ICD-10-CM

## 2022-03-29 DIAGNOSIS — R2681 Unsteadiness on feet: Secondary | ICD-10-CM

## 2022-03-29 DIAGNOSIS — M6281 Muscle weakness (generalized): Secondary | ICD-10-CM

## 2022-03-29 DIAGNOSIS — R2689 Other abnormalities of gait and mobility: Secondary | ICD-10-CM

## 2022-03-29 DIAGNOSIS — G373 Acute transverse myelitis in demyelinating disease of central nervous system: Secondary | ICD-10-CM

## 2022-03-29 NOTE — Therapy (Signed)
OUTPATIENT PHYSICAL THERAPY TREATMENT NOTE   Patient Name: Maurice Cruz MRN: [id number] DOB:08-Aug-1966, 56 y.o., male Today's Date: 03/29/2022  PCP: Dr. Glendon Axe REFERRING PROVIDER: Courtney Heys, MD    PT End of Session - 03/29/22 1406     Visit Number 13    Number of Visits 17    Date for PT Re-Evaluation 04/05/22    Authorization Type BCBS (60 visit limit annually)    PT Start Time 58    PT Stop Time 1445    PT Time Calculation (min) 43 min    Equipment Utilized During Treatment Gait belt    Activity Tolerance Patient tolerated treatment well    Behavior During Therapy WFL for tasks assessed/performed                 Past Medical History:  Diagnosis Date   Anxiety    Asthma    as a child   Depression    ED (erectile dysfunction)    GERD (gastroesophageal reflux disease)    Hx of spinal cord injury 03/2020   Hypertension    Neurogenic bladder    self caths   Neurogenic bowel    has to be digitially stimulated - 04/24/21   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[place])    bilateral legs   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[place])    Past Surgical History:  Procedure Laterality Date   Anorectal biospy  02/09/2021   Colon Biospy  02/09/2021   IR ANGIO INTRA EXTRACRAN SEL COM CAROTID INNOMINATE BILAT MOD SED  04/25/2021   IR ANGIO VERTEBRAL SEL SUBCLAVIAN INNOMINATE UNI L MOD SED  04/25/2021   IR ANGIO VERTEBRAL SEL VERTEBRAL UNI R MOD SED  04/25/2021   IR ANGIO/SPINAL LEFT  04/25/2021   IR ANGIO/SPINAL LEFT  04/25/2021   IR ANGIO/SPINAL LEFT  04/25/2021   IR ANGIO/SPINAL LEFT  04/25/2021   IR ANGIO/SPINAL LEFT  04/25/2021   IR ANGIO/SPINAL LEFT  04/25/2021   IR ANGIO/SPINAL LEFT  04/25/2021   IR ANGIO/SPINAL LEFT  04/25/2021   IR ANGIO/SPINAL LEFT  04/25/2021   IR ANGIO/SPINAL LEFT  04/25/2021   IR ANGIO/SPINAL LEFT  04/25/2021   IR ANGIO/SPINAL LEFT  04/25/2021   IR ANGIO/SPINAL LEFT  04/25/2021   IR ANGIO/SPINAL RIGHT  04/25/2021   IR ANGIO/SPINAL RIGHT  04/25/2021   IR ANGIO/SPINAL RIGHT  04/25/2021    IR ANGIO/SPINAL RIGHT  04/25/2021   IR ANGIO/SPINAL RIGHT  04/25/2021   IR ANGIO/SPINAL RIGHT  04/25/2021   IR ANGIO/SPINAL RIGHT  04/25/2021   IR ANGIO/SPINAL RIGHT  04/25/2021   IR ANGIO/SPINAL RIGHT  04/25/2021   IR ANGIO/SPINAL RIGHT  04/25/2021   IR ANGIO/SPINAL RIGHT  04/25/2021   IR ANGIO/SPINAL RIGHT  04/25/2021   IR ANGIO/SPINAL RIGHT  04/25/2021   IR ANGIO/SPINAL RIGHT  04/25/2021   IR ANGIOGRAM EXTREMITY BILATERAL  04/25/2021   IR RADIOLOGIST EVAL & MGMT  03/21/2021   RADIOLOGY WITH ANESTHESIA N/A 04/25/2021   Procedure: IR WITH ANESTHESIA SPINAL ANGIOGRAM;  Surgeon: Luanne Bras, MD;  Location: [facility] OR;  Service: Radiology;  Laterality: N/A;   Patient Active Problem List   Diagnosis Date Noted   Disease of spinal cord ([place]) 05/21/2021   Anti-RNP antibodies present 05/21/2021   Weakness of both lower extremities 02/05/2021   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   Neurogenic bladder 12/08/2020   Neurogenic bowel 12/08/2020   Paraplegia following spinal cord injury ([place]) 12/08/2020   Wheelchair dependence 12/08/2020   Spasticity 12/08/2020   Chronic bilateral thoracic back pain  12/08/2020    ONSET DATE: 02/04/22 (date of referral)  REFERRING DIAG: G37.3 (ICD-10-CM) - 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[place]) G82.20 (ICD-10-CM) - Paraplegia following spinal cord injury ([place])   THERAPY DIAG:  Other abnormalities of gait and mobility  Muscle weakness (generalized)  Difficulty in walking, not elsewhere classified  Unsteadiness on feet  Abnormal posture  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[facility])  SUBJECTIVE:                                                                                                                                                                                              SUBJECTIVE STATEMENT: Continues to walk using RW at home.  He has not heard from [place] yet.  Pt accompanied by: family member and self  - dad is present today  PERTINENT HISTORY: Transverse myelitis,  neurogenic bowel and bladder  PAIN:  Are you having pain? YES 2/10 Low back Ache  PRECAUTIONS: None  WEIGHT BEARING RESTRICTIONS No  FALLS: Has patient fallen in last 6 months? No  LIVING ENVIRONMENT: Lives with:  mother and father Lives in: House/apartment Stairs: Yes: Internal: 12 steps; on right going up and on left going up and External: 1 steps; none, pt has chair lift at home for stairs. Has following equipment at home: Gilford Rile - 2 wheeled, Wheelchair (manual), and bed side commode  PLOF:  Independent with dressing, self care, transfer, parents help with cooking, cleaning.  PATIENT GOALS Improve strength in R leg, improve walking  OBJECTIVE:   LE ROM:     Active  Right EVAL Left EVAL  Hip flexion    Hip extension    Hip abduction    Hip adduction    Hip internal rotation    Hip external rotation    Knee flexion    Knee extension    Ankle dorsiflexion    Ankle plantarflexion    Ankle inversion    Ankle eversion     (Blank rows = not tested)  MMT:    MMT Right EAVL Left EVAL  Hip flexion 1+ 3+  Hip extension    Hip abduction 1+ 3+  Hip adduction 1+ 3+  Hip internal rotation    Hip external rotation    Knee flexion 1+ 4  Knee extension 1+ 4  Ankle dorsiflexion 1+ 3+  Ankle plantarflexion 1+ 3+  Ankle inversion    Ankle eversion    (Blank rows = not tested)  Pt uses manual wheelchair when he goes to MD appts or stores. Inside the house he uses walker for household mobility.  FUNCTIONAL TESTs:  5 times sit to stand: 47 sec Timed up and  go (TUG): 1 min with RW 10 meter walk test: 0.27 m/s with RW  TODAY'S TREATMENT:   GAIT: Gait pattern: step to pattern, step through pattern, decreased hip/knee flexion- Right, decreased hip/knee flexion- Left, decreased ankle dorsiflexion- Right, decreased ankle dorsiflexion- Left, and poor foot clearance- Right Distance walked: 162' (no AFO) + 115' (R posterior [place]) Assistive device utilized:  Right  posterior [place] + loftstrand crutches Level of assistance: SBA and CGA Comments: Pt demonstrates mild RLE scuff during limb advancement w/o AFO.  Intermittent CGA provided due to fatigue.  Progressed from ambulating in // bars w/o UE support to around gym on level floor.  Pt demonstrates some fatigue in the RLE during second bout of gait w/ AFO resulting in need for UE support via loftstrand crutches. Pt practices donning and doffing R posterior [place] AFO at [facility] w/ min cuing from therapist to promote ability to perform task at home once he receives personal AFO.  Pt stands at bottom stair w/ BUE support on rails performing step taps x10 each LE w/ therapist facilitating RLE knee flexion. Progressed to maintained closed chain step ups x10 each LE cued for unlocking of RLE prior to lowering LLE from step.  PATIENT EDUCATION: Education details: Continue HEP.   Person educated: Patient Education method: Explanation Education comprehension: verbalized understanding   HOME EXERCISE PROGRAM: Access Code: [email or web address] URL: [email or web address] Date: 02/20/2022 Prepared by: Markus Jarvis  Exercises - Side Stepping with Counter Support  - 1 x daily - 7 x weekly - 2 sets - 10 reps - Sit to Stand with Counter Support  - 1 x daily - 7 x weekly - 10 reps   Added on 03/01/2022  Pt educated on trying L sidelying with hooklying position: R UE providing  AA hip flexion to R hip and then pt pushes leg into extension with UE resisting it. Pt educated to perform alternating AA flexion and resisted extension for 3 x 10 daily for HEP    GOALS: Goals reviewed with patient? Yes  SHORT TERM GOALS: Target date: 03/08/2022   (Remove Blue Hyperlink)  Patient will be able to ambulate 500' with RW without needing a rest break to improve functional walking endurance. Baseline:20 meters, 115' with RW (02/20/22); 230' wit RW (02/22/22); 503' w/ RW 03/13/2022 Goal status: MET  2.  Patient will get a  shower bench to improve his tub transfers while reducing risk for fall and reduce strain on his shoulders while getting in and out of the car.  Baseline: takes shower in tub; pt states he has obtained a shower bench and is using it Goal status: MET  3.  Pt will be able to tolerate stand/walk activities or 10 min Baseline: 5 min; see treatment note from 03/08/22 Goal status: MET  LONG TERM GOALS: Target date: 04/05/2022   (Remove Blue Hyperlink)  Patient will demo gait speed with walker of at least 0.47m/s to improve his functional ambulation status to limited community ambulator Baseline: 0.66m/s with RW (02/08/22) Goal status: INITIAL  2.  Pt will demo TUG score of <40 sec with RW to improve functional mobility Baseline: 1 min (02/08/22) Goal status: INITIAL  3.  Patient will be able to perform 5x sit to stand with use of bil UE support in <35 seconds to improve functional strength. Baseline: 47 seconds (01/29/22) Goal status: INITIAL  ASSESSMENT:  CLINICAL IMPRESSION: Focus of skilled session on gait training w/o AD w/ pt demonstrating improved endurance from last session.  Will continue to  assess gait with LRAD at next session for use on unlevel surfaces and increased distances.  Pt continues to progress towards LTGs w/ skilled PT intervention.   OBJECTIVE IMPAIRMENTS Abnormal gait, decreased activity tolerance, decreased balance, decreased endurance, decreased mobility, difficulty walking, decreased ROM, decreased strength, hypomobility, increased fascial restrictions, impaired flexibility, impaired sensation, impaired tone, improper body mechanics, postural dysfunction, and pain.   ACTIVITY LIMITATIONS cleaning, community activity, driving, meal prep, laundry, and yard work.   PERSONAL FACTORS Age, Past/current experiences, and Time since onset of injury/illness/exacerbation are also affecting patient's functional outcome.    REHAB POTENTIAL: Good  CLINICAL DECISION MAKING:  Stable/uncomplicated  EVALUATION COMPLEXITY: Low  PLAN: PT FREQUENCY: 2x/week  PT DURATION: 8 weeks  PLANNED INTERVENTIONS: Therapeutic exercises, Therapeutic activity, Neuromuscular re-education, Balance training, Gait training, Patient/Family education, Joint mobilization, Stair training, Orthotic/Fit training, Aquatic Therapy, Spinal mobilization, Cryotherapy, Moist heat, and Manual therapy  PLAN FOR NEXT SESSION:  Plan to re-cert in 2 visits for 2x/wk for 4 wks to get pt using his own AFO and allow time for loftstrand crutch training if pt gets his own.  Progress HEP.  Continue to work with loft strand crutches and work on standing balance as needed.  Progress gait w/o AD vs cane.  Gait training w/ LRAD over unlevel surfaces.  Bary Richard, PT, DPT 03/29/2022, 3:04 PM

## 2022-04-01 ENCOUNTER — Encounter: Payer: Self-pay | Admitting: Physical Therapy

## 2022-04-01 ENCOUNTER — Ambulatory Visit: Payer: BC Managed Care – PPO | Attending: Physical Medicine and Rehabilitation | Admitting: Physical Therapy

## 2022-04-01 DIAGNOSIS — R2689 Other abnormalities of gait and mobility: Secondary | ICD-10-CM | POA: Diagnosis not present

## 2022-04-01 DIAGNOSIS — M6281 Muscle weakness (generalized): Secondary | ICD-10-CM | POA: Diagnosis present

## 2022-04-01 DIAGNOSIS — G373 Acute transverse myelitis in demyelinating disease of central nervous system: Secondary | ICD-10-CM | POA: Insufficient documentation

## 2022-04-01 DIAGNOSIS — R2681 Unsteadiness on feet: Secondary | ICD-10-CM | POA: Insufficient documentation

## 2022-04-01 DIAGNOSIS — R293 Abnormal posture: Secondary | ICD-10-CM | POA: Insufficient documentation

## 2022-04-01 NOTE — Therapy (Signed)
OUTPATIENT PHYSICAL THERAPY TREATMENT NOTE   Patient Name: Maurice Cruz MRN: [id number] DOB:[birth year], 56 y.o., male Today's Date: 04/01/2022  PCP: Dr. Glendon Axe REFERRING PROVIDER: Courtney Heys, MD    PT End of Session - 04/01/22 1448     Visit Number 14    Number of Visits 17    Date for PT Re-Evaluation 04/05/22    Authorization Type BCBS (60 visit limit annually)    PT Start Time 40    PT Stop Time 1529    PT Time Calculation (min) 43 min    Equipment Utilized During Treatment Gait belt    Activity Tolerance Patient tolerated treatment well    Behavior During Therapy WFL for tasks assessed/performed                 Past Medical History:  Diagnosis Date   Anxiety    Asthma    as a child   Depression    ED (erectile dysfunction)    GERD (gastroesophageal reflux disease)    Hx of spinal cord injury 03/2020   Hypertension    Neurogenic bladder    self caths   Neurogenic bowel    has to be digitially stimulated - 04/24/21   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[place])    bilateral legs   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[place])    Past Surgical History:  Procedure Laterality Date   Anorectal biospy  02/09/2021   Colon Biospy  02/09/2021   IR ANGIO INTRA EXTRACRAN SEL COM CAROTID INNOMINATE BILAT MOD SED  04/25/2021   IR ANGIO VERTEBRAL SEL SUBCLAVIAN INNOMINATE UNI L MOD SED  04/25/2021   IR ANGIO VERTEBRAL SEL VERTEBRAL UNI R MOD SED  04/25/2021   IR ANGIO/SPINAL LEFT  04/25/2021   IR ANGIO/SPINAL LEFT  04/25/2021   IR ANGIO/SPINAL LEFT  04/25/2021   IR ANGIO/SPINAL LEFT  04/25/2021   IR ANGIO/SPINAL LEFT  04/25/2021   IR ANGIO/SPINAL LEFT  04/25/2021   IR ANGIO/SPINAL LEFT  04/25/2021   IR ANGIO/SPINAL LEFT  04/25/2021   IR ANGIO/SPINAL LEFT  04/25/2021   IR ANGIO/SPINAL LEFT  04/25/2021   IR ANGIO/SPINAL LEFT  04/25/2021   IR ANGIO/SPINAL LEFT  04/25/2021   IR ANGIO/SPINAL LEFT  04/25/2021   IR ANGIO/SPINAL RIGHT  04/25/2021   IR ANGIO/SPINAL RIGHT  04/25/2021   IR ANGIO/SPINAL RIGHT  04/25/2021    IR ANGIO/SPINAL RIGHT  04/25/2021   IR ANGIO/SPINAL RIGHT  04/25/2021   IR ANGIO/SPINAL RIGHT  04/25/2021   IR ANGIO/SPINAL RIGHT  04/25/2021   IR ANGIO/SPINAL RIGHT  04/25/2021   IR ANGIO/SPINAL RIGHT  04/25/2021   IR ANGIO/SPINAL RIGHT  04/25/2021   IR ANGIO/SPINAL RIGHT  04/25/2021   IR ANGIO/SPINAL RIGHT  04/25/2021   IR ANGIO/SPINAL RIGHT  04/25/2021   IR ANGIO/SPINAL RIGHT  04/25/2021   IR ANGIOGRAM EXTREMITY BILATERAL  04/25/2021   IR RADIOLOGIST EVAL & MGMT  03/21/2021   RADIOLOGY WITH ANESTHESIA N/A 04/25/2021   Procedure: IR WITH ANESTHESIA SPINAL ANGIOGRAM;  Surgeon: Luanne Bras, MD;  Location: [facility] OR;  Service: Radiology;  Laterality: N/A;   Patient Active Problem List   Diagnosis Date Noted   Disease of spinal cord ([place]) 05/21/2021   Anti-RNP antibodies present 05/21/2021   Weakness of both lower extremities 02/05/2021   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   Neurogenic bladder 12/08/2020   Neurogenic bowel 12/08/2020   Paraplegia following spinal cord injury ([place]) 12/08/2020   Wheelchair dependence 12/08/2020   Spasticity 12/08/2020   Chronic bilateral thoracic back pain  12/08/2020    ONSET DATE: 02/04/22 (date of referral)  REFERRING DIAG: G37.3 (ICD-10-CM) - 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[place]) G82.20 (ICD-10-CM) - Paraplegia following spinal cord injury ([place])   THERAPY DIAG:  Other abnormalities of gait and mobility  Muscle weakness (generalized)  Unsteadiness on feet  SUBJECTIVE:                                                                                                                                                                                              SUBJECTIVE STATEMENT: Called [name] and he is going to schedule an appt for the 1st of August when he returns from his trip.  He will be out of town from 7/17-7/31.  Pt accompanied by: family member and self  - dad is present today  PERTINENT HISTORY: Transverse myelitis, neurogenic bowel and  bladder  PAIN:  Are you having pain? YES 2/10 Low back Ache  PRECAUTIONS: None  WEIGHT BEARING RESTRICTIONS No  FALLS: Has patient fallen in last 6 months? No  LIVING ENVIRONMENT: Lives with:  mother and father Lives in: House/apartment Stairs: Yes: Internal: 12 steps; on right going up and on left going up and External: 1 steps; none, pt has chair lift at home for stairs. Has following equipment at home: Gilford Rile - 2 wheeled, Wheelchair (manual), and bed side commode  PLOF:  Independent with dressing, self care, transfer, parents help with cooking, cleaning.  PATIENT GOALS Improve strength in R leg, improve walking  OBJECTIVE:   LE ROM:     Active  Right EVAL Left EVAL  Hip flexion    Hip extension    Hip abduction    Hip adduction    Hip internal rotation    Hip external rotation    Knee flexion    Knee extension    Ankle dorsiflexion    Ankle plantarflexion    Ankle inversion    Ankle eversion     (Blank rows = not tested)  MMT:    MMT Right EAVL Left EVAL  Hip flexion 1+ 3+  Hip extension    Hip abduction 1+ 3+  Hip adduction 1+ 3+  Hip internal rotation    Hip external rotation    Knee flexion 1+ 4  Knee extension 1+ 4  Ankle dorsiflexion 1+ 3+  Ankle plantarflexion 1+ 3+  Ankle inversion    Ankle eversion    (Blank rows = not tested)  Pt uses manual wheelchair when he goes to MD appts or stores. Inside the house he uses walker for household mobility.  FUNCTIONAL TESTs:  5 times sit to stand: 47 sec Timed up  and go (TUG): 1 min with RW 10 meter walk test: 0.27 m/s with RW  TODAY'S TREATMENT:  Time spent discussing scheduling and re-cert d/t pt being out of town for 2 weeks beginning 7/17-7/31.  Modified HEP.  See bolded below for details.  PATIENT EDUCATION: Education details: Continue HEP.   Person educated: Patient Education method: Explanation Education comprehension: verbalized understanding   HOME EXERCISE PROGRAM: Access  Code: [email or web address] URL: [email or web address] Date: 04/01/2022 Prepared by: Elease Etienne  Exercises - Sit to Stand with Resistance Around Legs  - 1 x daily - 5 x weekly - 2 sets - 8 reps - Side Stepping with Resistance at Thighs and Counter Support  - 1 x daily - 5 x weekly - 3 sets - 10 reps - Standing Single Leg Stance with Counter Support  - 1 x daily - 5 x weekly - 1 sets - 5 reps - work to 30 seconds hold - Side-lying Knee Flexion  - 1 x daily - 5 x weekly - 3 sets - 3-5 reps - Walking with Counter Support  - 1 x daily - 5 x weekly - 3 sets - 10 reps-w/ red theraband at thighs   Added on 03/01/2022  Pt educated on trying L sidelying with hooklying position: R UE providing  AA hip flexion to R hip and then pt pushes leg into extension with UE resisting it. Pt educated to perform alternating AA flexion and resisted extension for 3 x 10 daily for HEP    GOALS: Goals reviewed with patient? Yes  SHORT TERM GOALS: Target date: 03/08/2022   (Remove Blue Hyperlink)  Patient will be able to ambulate 500' with RW without needing a rest break to improve functional walking endurance. Baseline:20 meters, 115' with RW (02/20/22); 230' wit RW (02/22/22); 503' w/ RW 03/13/2022 Goal status: MET  2.  Patient will get a shower bench to improve his tub transfers while reducing risk for fall and reduce strain on his shoulders while getting in and out of the car.  Baseline: takes shower in tub; pt states he has obtained a shower bench and is using it Goal status: MET  3.  Pt will be able to tolerate stand/walk activities or 10 min Baseline: 5 min; see treatment note from 03/08/22 Goal status: MET  LONG TERM GOALS: Target date: 04/05/2022   (Remove Blue Hyperlink)  Patient will demo gait speed with walker of at least 0.12ms to improve his functional ambulation status to limited community ambulator Baseline: 0.253m with RW (02/08/22) Goal status: INITIAL  2.  Pt will demo TUG score of <40  sec with RW to improve functional mobility Baseline: 1 min (02/08/22) Goal status: INITIAL  3.  Patient will be able to perform 5x sit to stand with use of bil UE support in <35 seconds to improve functional strength. Baseline: 47 seconds (01/29/22) Goal status: INITIAL  ASSESSMENT:  CLINICAL IMPRESSION: Focus of skilled session today on advancement of HEP to reflect and challenge current functional status.  Pt remains challenged by single leg stance on the RLE and initiation of right knee flexion.  Will continue to address these deficits and others per POC in upcoming sessions following re-cert.   OBJECTIVE IMPAIRMENTS Abnormal gait, decreased activity tolerance, decreased balance, decreased endurance, decreased mobility, difficulty walking, decreased ROM, decreased strength, hypomobility, increased fascial restrictions, impaired flexibility, impaired sensation, impaired tone, improper body mechanics, postural dysfunction, and pain.   ACTIVITY LIMITATIONS cleaning, community activity, driving, meal prep, laundry, and yard work.  PERSONAL FACTORS Age, Past/current experiences, and Time since onset of injury/illness/exacerbation are also affecting patient's functional outcome.    REHAB POTENTIAL: Good  CLINICAL DECISION MAKING: Stable/uncomplicated  EVALUATION COMPLEXITY: Low  PLAN: PT FREQUENCY: 2x/week  PT DURATION: 8 weeks  PLANNED INTERVENTIONS: Therapeutic exercises, Therapeutic activity, Neuromuscular re-education, Balance training, Gait training, Patient/Family education, Joint mobilization, Stair training, Orthotic/Fit training, Aquatic Therapy, Spinal mobilization, Cryotherapy, Moist heat, and Manual therapy  PLAN FOR NEXT SESSION:  ASSESS LTG! Plan to re-cert on 7/5 for 2x/wk for 5 wks to get pt using his own AFO and allow time for loftstrand crutch training if pt gets his own.  Add corner balance to HEP.  Continue to work with loft strand crutches and work on standing  balance as needed.  Progress gait w/o AD vs cane.  Gait training w/ LRAD over unlevel surfaces.  Initiate stair training as able (step taps > step ups w/ BUE support)  Bary Richard, PT, DPT 04/01/2022, 3:39 PM

## 2022-04-01 NOTE — Patient Instructions (Signed)
Access Code: [email or web address] URL: [email or web address] Date: 04/01/2022 Prepared by: Elease Etienne  Exercises - Sit to Stand with Resistance Around Legs  - 1 x daily - 5 x weekly - 2 sets - 8 reps - Side Stepping with Resistance at Thighs and Counter Support  - 1 x daily - 5 x weekly - 3 sets - 10 reps - Standing Single Leg Stance with Counter Support  - 1 x daily - 5 x weekly - 1 sets - 5 reps - work to 30 seconds hold - Side-lying Knee Flexion  - 1 x daily - 5 x weekly - 3 sets - 3-5 reps - Walking with Counter Support  - 1 x daily - 5 x weekly - 3 sets - 10 reps- w/ red theraband at thighs

## 2022-04-03 ENCOUNTER — Encounter: Payer: Self-pay | Admitting: Physical Therapy

## 2022-04-03 ENCOUNTER — Ambulatory Visit: Payer: BC Managed Care – PPO | Admitting: Physical Therapy

## 2022-04-03 DIAGNOSIS — M6281 Muscle weakness (generalized): Secondary | ICD-10-CM

## 2022-04-03 DIAGNOSIS — R2689 Other abnormalities of gait and mobility: Secondary | ICD-10-CM | POA: Diagnosis not present

## 2022-04-03 DIAGNOSIS — R2681 Unsteadiness on feet: Secondary | ICD-10-CM

## 2022-04-03 DIAGNOSIS — R293 Abnormal posture: Secondary | ICD-10-CM

## 2022-04-03 DIAGNOSIS — G373 Acute transverse myelitis in demyelinating disease of central nervous system: Secondary | ICD-10-CM

## 2022-04-03 NOTE — Therapy (Signed)
OUTPATIENT PHYSICAL THERAPY TREATMENT NOTE   Patient Name: Maurice Cruz MRN: [id number] DOB:[birth year], 56 y.o., male Today's Date: 04/07/2022  PCP: Dr. Glendon Axe REFERRING PROVIDER: Courtney Heys, MD    04/03/22 1534  PT Visits / Re-Eval  Visit Number 15  Number of Visits 27  Date for PT Re-Evaluation 05/31/22  Authorization  Authorization Type [employer] (60 visit limit annually)  PT Time Calculation  PT Start Time 1532  PT Stop Time 1617  PT Time Calculation (min) 45 min  PT - End of Session  Equipment Utilized During Treatment Gait belt  Activity Tolerance Patient tolerated treatment well  Behavior During Therapy WFL for tasks assessed/performed     Past Medical History:  Diagnosis Date   Anxiety    Asthma    as a child   Depression    ED (erectile dysfunction)    GERD (gastroesophageal reflux disease)    Hx of spinal cord injury 03/2020   Hypertension    Neurogenic bladder    self caths   Neurogenic bowel    has to be digitially stimulated - 04/24/21   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[place])    bilateral legs   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[place])    Past Surgical History:  Procedure Laterality Date   Anorectal biospy  02/09/2021   Colon Biospy  02/09/2021   IR ANGIO INTRA EXTRACRAN SEL COM CAROTID INNOMINATE BILAT MOD SED  04/25/2021   IR ANGIO VERTEBRAL SEL SUBCLAVIAN INNOMINATE UNI L MOD SED  04/25/2021   IR ANGIO VERTEBRAL SEL VERTEBRAL UNI R MOD SED  04/25/2021   IR ANGIO/SPINAL LEFT  04/25/2021   IR ANGIO/SPINAL LEFT  04/25/2021   IR ANGIO/SPINAL LEFT  04/25/2021   IR ANGIO/SPINAL LEFT  04/25/2021   IR ANGIO/SPINAL LEFT  04/25/2021   IR ANGIO/SPINAL LEFT  04/25/2021   IR ANGIO/SPINAL LEFT  04/25/2021   IR ANGIO/SPINAL LEFT  04/25/2021   IR ANGIO/SPINAL LEFT  04/25/2021   IR ANGIO/SPINAL LEFT  04/25/2021   IR ANGIO/SPINAL LEFT  04/25/2021   IR ANGIO/SPINAL LEFT  04/25/2021   IR ANGIO/SPINAL LEFT  04/25/2021   IR ANGIO/SPINAL RIGHT  04/25/2021   IR ANGIO/SPINAL RIGHT  04/25/2021   IR  ANGIO/SPINAL RIGHT  04/25/2021   IR ANGIO/SPINAL RIGHT  04/25/2021   IR ANGIO/SPINAL RIGHT  04/25/2021   IR ANGIO/SPINAL RIGHT  04/25/2021   IR ANGIO/SPINAL RIGHT  04/25/2021   IR ANGIO/SPINAL RIGHT  04/25/2021   IR ANGIO/SPINAL RIGHT  04/25/2021   IR ANGIO/SPINAL RIGHT  04/25/2021   IR ANGIO/SPINAL RIGHT  04/25/2021   IR ANGIO/SPINAL RIGHT  04/25/2021   IR ANGIO/SPINAL RIGHT  04/25/2021   IR ANGIO/SPINAL RIGHT  04/25/2021   IR ANGIOGRAM EXTREMITY BILATERAL  04/25/2021   IR RADIOLOGIST EVAL & MGMT  03/21/2021   RADIOLOGY WITH ANESTHESIA N/A 04/25/2021   Procedure: IR WITH ANESTHESIA SPINAL ANGIOGRAM;  Surgeon: Luanne Bras, MD;  Location: [facility];  Service: Radiology;  Laterality: N/A;   Patient Active Problem List   Diagnosis Date Noted   Disease of spinal cord ([place]) 05/21/2021   Anti-RNP antibodies present 05/21/2021   Weakness of both lower extremities 02/05/2021   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   Neurogenic bladder 12/08/2020   Neurogenic bowel 12/08/2020   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   Wheelchair dependence 12/08/2020   Spasticity 12/08/2020   Chronic bilateral thoracic back pain 12/08/2020    ONSET DATE: 02/04/22 (date of referral)  REFERRING DIAG: G37.3 (ICD-10-CM) - 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[place]) G82.20 (ICD-10-CM) -  Paraplegia following spinal cord injury ([place])   THERAPY DIAG:  Other abnormalities of gait and mobility - Plan: PT plan of care cert/re-cert  Muscle weakness (generalized) - Plan: PT plan of care cert/re-cert  Unsteadiness on feet - Plan: PT plan of care cert/re-cert  Abnormal posture - Plan: PT plan of care cert/re-cert  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[place]) - Plan: PT plan of care cert/re-cert  SUBJECTIVE:                                                                                                                                                                                              SUBJECTIVE STATEMENT: He is still planning to  call [name] and get an appt scheduled for as close to the date of his return from vacation as possible.  He denies falls or other acute issues.  Pt accompanied by: family member and self  - dad is present today  PERTINENT HISTORY: Transverse myelitis, neurogenic bowel and bladder  PAIN:  He endorses same pain from prior session. Are you having pain? YES 2/10 Low back Ache  PRECAUTIONS: None  WEIGHT BEARING RESTRICTIONS No  FALLS: Has patient fallen in last 6 months? No  LIVING ENVIRONMENT: Lives with:  mother and father Lives in: House/apartment Stairs: Yes: Internal: 12 steps; on right going up and on left going up and External: 1 steps; none, pt has chair lift at home for stairs. Has following equipment at home: Gilford Rile - 2 wheeled, Wheelchair (manual), and bed side commode  PLOF:  Independent with dressing, self care, transfer, parents help with cooking, cleaning.  PATIENT GOALS Improve strength in R leg, improve walking  OBJECTIVE:   LE ROM:     Active  Right EVAL Left EVAL  Hip flexion    Hip extension    Hip abduction    Hip adduction    Hip internal rotation    Hip external rotation    Knee flexion    Knee extension    Ankle dorsiflexion    Ankle plantarflexion    Ankle inversion    Ankle eversion     (Blank rows = not tested)  MMT:    MMT Right EAVL Left EVAL  Hip flexion 1+ 3+  Hip extension    Hip abduction 1+ 3+  Hip adduction 1+ 3+  Hip internal rotation    Hip external rotation    Knee flexion 1+ 4  Knee extension 1+ 4  Ankle dorsiflexion 1+ 3+  Ankle plantarflexion 1+ 3+  Ankle inversion    Ankle eversion    (Blank rows = not tested)  Pt uses manual wheelchair  when he goes to MD appts or stores. Inside the house he uses walker for household mobility.  FUNCTIONAL TESTs:  5 times sit to stand: 47 sec Timed up and go (TUG): 1 min with RW 10 meter walk test: 0.27 m/s with RW  TODAY'S TREATMENT:  Assessed 10MWT w/ RW:  18.06 sec =  0.55 m/sec OR 1.83 ft/sec Assessed TUG w/ RW:  22.56 sec Assessed 5xSTS w/ BUE support:  27 sec STS x25 progressing from BUE support to one hand on walker and one on arm rest to single UE support; edu to practice STS w/ one UE.  GAIT: Gait pattern: step through pattern, decreased arm swing- Right, decreased arm swing- Left, decreased step length- Left, decreased stance time- Right, decreased stride length, decreased hip/knee flexion- Right, and decreased ankle dorsiflexion- Right Distance walked: 25' x4 Assistive device utilized: Lobbyist and Hurricane-tripod cane Level of assistance: SBA and CGA Comments: Pt demos increased instability with Hurricane vs small base quad cane.  Trialed both canes on both sides due to pt comfort on right side, functionally more stable with assistive device on left.  He demonstrates good gait pattern with use of both canes, but would likely benefit more from use of small base quad cane for short distances.  Needs further assessment with AFO vs loftstrands vs RW for endurance benefit and contextual use benefits.   PATIENT EDUCATION: Education details: Continue HEP.  Continue use of RW especially on unlevel surfaces and for long distances.  Process for re-cert and time spent scheduling and discussing scheduling as pt out-of-town for 2 weeks in July. Person educated: Patient Education method: Explanation Education comprehension: verbalized understanding   HOME EXERCISE PROGRAM: Access Code: [email or web address] URL: [email or web address] Date: 04/01/2022 Prepared by: Elease Etienne  Exercises - Sit to Stand with Resistance Around Legs  - 1 x daily - 5 x weekly - 2 sets - 8 reps - Side Stepping with Resistance at Thighs and Counter Support  - 1 x daily - 5 x weekly - 3 sets - 10 reps - Standing Single Leg Stance with Counter Support  - 1 x daily - 5 x weekly - 1 sets - 5 reps - work to 30 seconds hold - Side-lying Knee Flexion  - 1 x daily - 5  x weekly - 3 sets - 3-5 reps - Walking with Counter Support  - 1 x daily - 5 x weekly - 3 sets - 10 reps-w/ red theraband at thighs   Added on 03/01/2022  Pt educated on trying L sidelying with hooklying position: R UE providing  AA hip flexion to R hip and then pt pushes leg into extension with UE resisting it. Pt educated to perform alternating AA flexion and resisted extension for 3 x 10 daily for HEP    OLD GOALS: Goals reviewed with patient? Yes  SHORT TERM GOALS: Target date: 03/08/2022   (Remove Blue Hyperlink)  Patient will be able to ambulate 500' with RW without needing a rest break to improve functional walking endurance. Baseline:20 meters, 115' with RW (02/20/22); 230' wit RW (02/22/22); 503' w/ RW 03/13/2022 Goal status: MET  2.  Patient will get a shower bench to improve his tub transfers while reducing risk for fall and reduce strain on his shoulders while getting in and out of the car.  Baseline: takes shower in tub; pt states he has obtained a shower bench and is using it Goal status: MET  3.  Pt will be able to  tolerate stand/walk activities or 10 min Baseline: 5 min; see treatment note from 03/08/22 Goal status: MET  LONG TERM GOALS: Target date: 04/05/2022   (Remove Blue Hyperlink)  Patient will demo gait speed with walker of at least 0.71m/s to improve his functional ambulation status to limited community ambulator Baseline: 0.12m/s with RW (02/08/22); 0.55 m/sec w/ RW (04/03/2022) Goal status: MET  2.  Pt will demo TUG score of <40 sec with RW to improve functional mobility Baseline: 1 min (02/08/22); 22.56 sec w/ RW (04/03/2022) Goal status: MET  3.  Patient will be able to perform 5x sit to stand with use of bil UE support in <35 seconds to improve functional strength. Baseline: 47 seconds (01/29/22); 27 sec w/ BUE support (04/03/2022) Goal status: MET  UPDATED GOALS: STGs=LONG TERM GOALS: Target date: 05/03/2022 (assess and update every 30 days)   Patient will demo gait  speed with LRAD of at least 0.28m/s to improve his functional independence. Baseline: 0.58m/s with RW (02/08/22); 0.55 m/sec w/ RW (04/03/2022) Goal status: REVISED  2.  Pt will demo TUG score of <20 sec with LRAD to improve functional mobility. Baseline: 1 min (02/08/22); 22.56 sec w/ RW (04/03/2022) Goal status: REVISED  3.  Patient will be able to perform 5x sit to stand with use of bil UE support in <20 seconds to improve functional LE strength and power. Baseline: 47 seconds (01/29/22); 27 sec w/ BUE support (04/03/2022) Goal status: REVISED  4.  Pt will ambulate 53' w/ LRAD and right posterior [place] AFO at supervision level to improve functional mobility to limited community distances.  Baseline:  230' w/ RW SBA/CGA  Goal status:  INITIAL  5.  Stair training will be initiated with caregiver training for guarding safety to improve safe independence with access to home environment.  Baseline:  not initiated.  Goal status:  INITIAL  ASSESSMENT:  CLINICAL IMPRESSION: Assessed LTGs this session with pt meeting all 3 goals.  His gait speed with a RW improved to 0.55 m/sec vs 0.27 m/sec on evaluation.  He performed the TUG with use of a RW in 22.56 sec vs 1 minute from evaluation.  He is unable to perform sit-to-stand transfers without use of UE support, but with UE support he performs 5xSTS in 27 seconds vs 47 seconds from evaluation.  His gait training continues to progress and he demonstrates potential for functional use of less restrictive assistive device than current RW for primarily household or limited community distances.  Will continue to assess and progress as able to optimize functional outcomes with re-cert period.   OBJECTIVE IMPAIRMENTS Abnormal gait, decreased activity tolerance, decreased balance, decreased endurance, decreased mobility, difficulty walking, decreased ROM, decreased strength, hypomobility, increased fascial restrictions, impaired flexibility, impaired sensation,  impaired tone, improper body mechanics, postural dysfunction, and pain.   ACTIVITY LIMITATIONS cleaning, community activity, driving, meal prep, laundry, and yard work.   PERSONAL FACTORS Age, Past/current experiences, and Time since onset of injury/illness/exacerbation are also affecting patient's functional outcome.    REHAB POTENTIAL: Good  CLINICAL DECISION MAKING: Stable/uncomplicated  EVALUATION COMPLEXITY: Low  PLAN: PT FREQUENCY: 2x/week  PT DURATION: other: 5 weeks  (re-cert date to accommodate pt out-of-town for 2 weeks and potential delay in scheduling)  PLANNED INTERVENTIONS: Therapeutic exercises, Therapeutic activity, Neuromuscular re-education, Balance training, Gait training, Patient/Family education, Joint mobilization, Stair training, Orthotic/Fit training, Aquatic Therapy, Spinal mobilization, Cryotherapy, Moist heat, and Manual therapy  PLAN FOR NEXT SESSION:  Delete old goals!  Add corner balance to  HEP.  Single leg on leg press.  SciFit.  Treadmill training-harness?  Continue to work with loft strand crutches and work on standing balance as needed.  Progress gait w/o AD vs cane-use of R posterior [place].  Gait training w/ LRAD over unlevel surfaces.  Initiate stair training as able (step taps > step ups w/ BUE support)  Bary Richard, PT, DPT 04/07/2022, 10:17 PM

## 2022-04-10 ENCOUNTER — Ambulatory Visit: Payer: BC Managed Care – PPO | Admitting: Physical Therapy

## 2022-04-10 ENCOUNTER — Encounter: Payer: Self-pay | Admitting: Physical Therapy

## 2022-04-10 DIAGNOSIS — M6281 Muscle weakness (generalized): Secondary | ICD-10-CM

## 2022-04-10 DIAGNOSIS — G373 Acute transverse myelitis in demyelinating disease of central nervous system: Secondary | ICD-10-CM

## 2022-04-10 DIAGNOSIS — R2689 Other abnormalities of gait and mobility: Secondary | ICD-10-CM | POA: Diagnosis not present

## 2022-04-10 DIAGNOSIS — R293 Abnormal posture: Secondary | ICD-10-CM

## 2022-04-10 DIAGNOSIS — R2681 Unsteadiness on feet: Secondary | ICD-10-CM

## 2022-04-10 NOTE — Patient Instructions (Signed)
Access Code: [email or web address] URL: [email or web address] Date: 04/01/2022 Prepared by: Elease Etienne  Exercises - Sit to Stand with Resistance Around Legs  - 1 x daily - 5 x weekly - 2 sets - 8 reps - Side Stepping with Resistance at Thighs and Counter Support  - 1 x daily - 5 x weekly - 3 sets - 10 reps - Standing Single Leg Stance with Counter Support  - 1 x daily - 5 x weekly - 1 sets - 5 reps - work to 30 seconds hold - Side-lying Knee Flexion  - 1 x daily - 5 x weekly - 3 sets - 3-5 reps - Walking with Counter Support  - 1 x daily - 5 x weekly - 3 sets - 10 reps-w/ red theraband at thighs - [place]berg Stance on Foam Pad with Head Rotation  - 1 x daily - 5 x weekly - 1 sets - 3-4 reps - 30 seconds hold - Tandem Stance on Foam Pad with Eyes Open  - 1 x daily - 5 x weekly - 1 sets - 3-4 reps - 30 seconds hold - Tandem Stance on Foam Pad with Eyes Closed  - 1 x daily - 5 x weekly - 1 sets - 3-4 reps - 30 seconds hold  Added on 03/01/2022  Pt educated on trying L sidelying with hooklying position: R UE providing  AA hip flexion to R hip and then pt pushes leg into extension with UE resisting it. Pt educated to perform alternating AA flexion and resisted extension for 3 x 10 daily for HEP

## 2022-04-10 NOTE — Therapy (Signed)
OUTPATIENT PHYSICAL THERAPY TREATMENT NOTE   Patient Name: Maurice Cruz MRN: [id number] DOB:June 06, 1966, 56 y.o., male Today's Date: 04/10/2022  PCP: Dr. Glendon Axe REFERRING PROVIDER: Courtney Heys, MD    PT End of Session - 04/10/22 1414     Visit Number 16    Number of Visits 27    Date for PT Re-Evaluation 05/31/22    Authorization Type BCBS (71 visit limit annually)    PT Start Time 1410    PT Stop Time 1456    PT Time Calculation (min) 46 min    Equipment Utilized During Treatment Gait belt    Activity Tolerance Patient tolerated treatment well    Behavior During Therapy WFL for tasks assessed/performed             Past Medical History:  Diagnosis Date   Anxiety    Asthma    as a child   Depression    ED (erectile dysfunction)    GERD (gastroesophageal reflux disease)    Hx of spinal cord injury 03/2020   Hypertension    Neurogenic bladder    self caths   Neurogenic bowel    has to be digitially stimulated - 04/24/21   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[place])    bilateral legs   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[place])    Past Surgical History:  Procedure Laterality Date   Anorectal biospy  02/09/2021   Colon Biospy  02/09/2021   IR ANGIO INTRA EXTRACRAN SEL COM CAROTID INNOMINATE BILAT MOD SED  04/25/2021   IR ANGIO VERTEBRAL SEL SUBCLAVIAN INNOMINATE UNI L MOD SED  04/25/2021   IR ANGIO VERTEBRAL SEL VERTEBRAL UNI R MOD SED  04/25/2021   IR ANGIO/SPINAL LEFT  04/25/2021   IR ANGIO/SPINAL LEFT  04/25/2021   IR ANGIO/SPINAL LEFT  04/25/2021   IR ANGIO/SPINAL LEFT  04/25/2021   IR ANGIO/SPINAL LEFT  04/25/2021   IR ANGIO/SPINAL LEFT  04/25/2021   IR ANGIO/SPINAL LEFT  04/25/2021   IR ANGIO/SPINAL LEFT  04/25/2021   IR ANGIO/SPINAL LEFT  04/25/2021   IR ANGIO/SPINAL LEFT  04/25/2021   IR ANGIO/SPINAL LEFT  04/25/2021   IR ANGIO/SPINAL LEFT  04/25/2021   IR ANGIO/SPINAL LEFT  04/25/2021   IR ANGIO/SPINAL RIGHT  04/25/2021   IR ANGIO/SPINAL RIGHT  04/25/2021   IR ANGIO/SPINAL RIGHT  04/25/2021   IR  ANGIO/SPINAL RIGHT  04/25/2021   IR ANGIO/SPINAL RIGHT  04/25/2021   IR ANGIO/SPINAL RIGHT  04/25/2021   IR ANGIO/SPINAL RIGHT  04/25/2021   IR ANGIO/SPINAL RIGHT  04/25/2021   IR ANGIO/SPINAL RIGHT  04/25/2021   IR ANGIO/SPINAL RIGHT  04/25/2021   IR ANGIO/SPINAL RIGHT  04/25/2021   IR ANGIO/SPINAL RIGHT  04/25/2021   IR ANGIO/SPINAL RIGHT  04/25/2021   IR ANGIO/SPINAL RIGHT  04/25/2021   IR ANGIOGRAM EXTREMITY BILATERAL  04/25/2021   IR RADIOLOGIST EVAL & MGMT  03/21/2021   RADIOLOGY WITH ANESTHESIA N/A 04/25/2021   Procedure: IR WITH ANESTHESIA SPINAL ANGIOGRAM;  Surgeon: Luanne Bras, MD;  Location: [facility];  Service: Radiology;  Laterality: N/A;   Patient Active Problem List   Diagnosis Date Noted   Disease of spinal cord ([place]) 05/21/2021   Anti-RNP antibodies present 05/21/2021   Weakness of both lower extremities 02/05/2021   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   Neurogenic bladder 12/08/2020   Neurogenic bowel 12/08/2020   Paraplegia following spinal cord injury ([place]) 12/08/2020   Wheelchair dependence 12/08/2020   Spasticity 12/08/2020   Chronic bilateral thoracic back pain 12/08/2020  ONSET DATE: 02/04/22 (date of referral)  REFERRING DIAG: G37.3 (ICD-10-CM) - 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[place]) G82.20 (ICD-10-CM) - Paraplegia following spinal cord injury ([place])   THERAPY DIAG:  Other abnormalities of gait and mobility  Muscle weakness (generalized)  Unsteadiness on feet  Abnormal posture  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[facility])  SUBJECTIVE:                                                                                                                                                                                              SUBJECTIVE STATEMENT: Has appt with [name] in early August, cannot remember exact date.  Is preparing to go out of town.  He denies falls or other issues at home.  Pt accompanied by: family member and self  - dad is present today  PERTINENT HISTORY: Transverse  myelitis, neurogenic bowel and bladder  PAIN:  He endorses same pain from prior session. Are you having pain? YES 2/10 Low back Ache  PRECAUTIONS: None  WEIGHT BEARING RESTRICTIONS No  FALLS: Has patient fallen in last 6 months? No  LIVING ENVIRONMENT: Lives with:  mother and father Lives in: House/apartment Stairs: Yes: Internal: 12 steps; on right going up and on left going up and External: 1 steps; none, pt has chair lift at home for stairs. Has following equipment at home: Gilford Rile - 2 wheeled, Wheelchair (manual), and bed side commode  PLOF:  Independent with dressing, self care, transfer, parents help with cooking, cleaning.  PATIENT GOALS Improve strength in R leg, improve walking  OBJECTIVE:   MMT Right EAVL Left EVAL  Hip flexion 1+ 3+  Hip extension    Hip abduction 1+ 3+  Hip adduction 1+ 3+  Hip internal rotation    Hip external rotation    Knee flexion 1+ 4  Knee extension 1+ 4  Ankle dorsiflexion 1+ 3+  Ankle plantarflexion 1+ 3+  Ankle inversion    Ankle eversion    (Blank rows = not tested)  Pt uses manual wheelchair when he goes to MD appts or stores. Inside the house he uses walker for household mobility.  FUNCTIONAL TESTs:  5 times sit to stand: 47 sec Timed up and go (TUG): 1 min with RW 10 meter walk test: 0.27 m/s with RW  TODAY'S TREATMENT:  -Made additions to HEP.  See below for details. -Corner balance: feet together firm surface x30 sec EO/EC, tandem on firm x30 sec EO each LE, feet together on pillows EC x30sec > head turns 2x30 sec w/ inc sway noted, alt tandem EC progressed to single finger support on back of chair 2x30 sec each LE -Bilateral leg  press x10 reps at 50, 60, and 70 # > unilateral leg press x10 RLE at 20 >30 # and LLE 2x10 at 40 # -SciFit x4.5 mins at L 3.0 using BLE only for LE strength and reciprocal motion. -Pt ambulates CGA varying clinic distances on level surface w/o AD w/o overt LOB.  PATIENT  EDUCATION: Education details: Continue HEP w/ additions.  Continue use of RW especially on unlevel surfaces and for long distances. Person educated: Patient Education method: Explanation Education comprehension: verbalized understanding   HOME EXERCISE PROGRAM: Access Code: [email or web address] URL: [email or web address] Date: 04/01/2022 Prepared by: Elease Etienne  Exercises - Sit to Stand with Resistance Around Legs  - 1 x daily - 5 x weekly - 2 sets - 8 reps - Side Stepping with Resistance at Thighs and Counter Support  - 1 x daily - 5 x weekly - 3 sets - 10 reps - Standing Single Leg Stance with Counter Support  - 1 x daily - 5 x weekly - 1 sets - 5 reps - work to 30 seconds hold - Side-lying Knee Flexion  - 1 x daily - 5 x weekly - 3 sets - 3-5 reps - Walking with Counter Support  - 1 x daily - 5 x weekly - 3 sets - 10 reps-w/ red theraband at thighs - Romberg Stance on Foam Pad with Head Rotation  - 1 x daily - 5 x weekly - 1 sets - 3-4 reps - 30 seconds hold - Tandem Stance on Foam Pad with Eyes Open  - 1 x daily - 5 x weekly - 1 sets - 3-4 reps - 30 seconds hold - Tandem Stance on Foam Pad with Eyes Closed  - 1 x daily - 5 x weekly - 1 sets - 3-4 reps - 30 seconds hold  Added on 03/01/2022  Pt educated on trying L sidelying with hooklying position: R UE providing  AA hip flexion to R hip and then pt pushes leg into extension with UE resisting it. Pt educated to perform alternating AA flexion and resisted extension for 3 x 10 daily for HEP  Goals reviewed with patient? Yes UPDATED GOALS: STGs=LONG TERM GOALS: Target date: 05/03/2022 (assess and update every 30 days)   Patient will demo gait speed with LRAD of at least 0.48m/s to improve his functional independence. Baseline: 0.76m/s with RW (02/08/22); 0.55 m/sec w/ RW (04/03/2022) Goal status: REVISED  2.  Pt will demo TUG score of <20 sec with LRAD to improve functional mobility. Baseline: 1 min (02/08/22); 22.56 sec w/ RW  (04/03/2022) Goal status: REVISED  3.  Patient will be able to perform 5x sit to stand with use of bil UE support in <20 seconds to improve functional LE strength and power. Baseline: 47 seconds (01/29/22); 27 sec w/ BUE support (04/03/2022) Goal status: REVISED  4.  Pt will ambulate 25' w/ LRAD and right posterior [place] AFO at supervision level to improve functional mobility to limited community distances.  Baseline:  230' w/ RW SBA/CGA  Goal status:  INITIAL  5.  Stair training will be initiated with caregiver training for guarding safety to improve safe independence with access to home environment.  Baseline:  not initiated.  Goal status:  INITIAL  ASSESSMENT:  CLINICAL IMPRESSION: Focus of skilled PT session on making static balance additions to HEP to progress without UE support.  Progressed strength training with use of leg press and SciFit today with pt demonstrating improved tolerance to all activity especially standing for prolonged  periods w/o UE support.  Will continue to progress towards LTGs as able when pt returns from vacation.   OBJECTIVE IMPAIRMENTS Abnormal gait, decreased activity tolerance, decreased balance, decreased endurance, decreased mobility, difficulty walking, decreased ROM, decreased strength, hypomobility, increased fascial restrictions, impaired flexibility, impaired sensation, impaired tone, improper body mechanics, postural dysfunction, and pain.   ACTIVITY LIMITATIONS cleaning, community activity, driving, meal prep, laundry, and yard work.   PERSONAL FACTORS Age, Past/current experiences, and Time since onset of injury/illness/exacerbation are also affecting patient's functional outcome.    REHAB POTENTIAL: Good  CLINICAL DECISION MAKING: Stable/uncomplicated  EVALUATION COMPLEXITY: Low  PLAN: PT FREQUENCY: 2x/week  PT DURATION: other: 5 weeks  (re-cert date to accommodate pt out-of-town for 2 weeks and potential delay in scheduling)  PLANNED  INTERVENTIONS: Therapeutic exercises, Therapeutic activity, Neuromuscular re-education, Balance training, Gait training, Patient/Family education, Joint mobilization, Stair training, Orthotic/Fit training, Aquatic Therapy, Spinal mobilization, Cryotherapy, Moist heat, and Manual therapy  PLAN FOR NEXT SESSION:  Single leg on leg press.  SciFit.  Treadmill training-harness?  Continue to work with loft strand crutches and work on standing balance as needed.  Progress gait w/o AD vs cane-use of R posterior [place].  Gait training w/ LRAD over unlevel surfaces.  Initiate stair training as able (step taps > step ups w/ BUE support)  Bary Richard, PT, DPT 04/10/2022, 3:20 PM

## 2022-05-01 ENCOUNTER — Ambulatory Visit: Payer: BC Managed Care – PPO | Admitting: Physical Therapy

## 2022-05-03 ENCOUNTER — Ambulatory Visit: Payer: BC Managed Care – PPO | Admitting: Physical Therapy

## 2022-05-06 ENCOUNTER — Ambulatory Visit: Payer: BC Managed Care – PPO | Attending: Physical Medicine and Rehabilitation | Admitting: Physical Therapy

## 2022-05-06 ENCOUNTER — Encounter: Payer: Self-pay | Admitting: Physical Therapy

## 2022-05-06 DIAGNOSIS — R293 Abnormal posture: Secondary | ICD-10-CM | POA: Insufficient documentation

## 2022-05-06 DIAGNOSIS — R2689 Other abnormalities of gait and mobility: Secondary | ICD-10-CM | POA: Diagnosis present

## 2022-05-06 DIAGNOSIS — R2681 Unsteadiness on feet: Secondary | ICD-10-CM | POA: Diagnosis present

## 2022-05-06 DIAGNOSIS — G373 Acute transverse myelitis in demyelinating disease of central nervous system: Secondary | ICD-10-CM | POA: Insufficient documentation

## 2022-05-06 DIAGNOSIS — M6281 Muscle weakness (generalized): Secondary | ICD-10-CM | POA: Insufficient documentation

## 2022-05-06 NOTE — Therapy (Unsigned)
OUTPATIENT PHYSICAL THERAPY TREATMENT NOTE   Patient Name: MURRELL DOME MRN: [id number] DOB:11/09/1965, 56 y.o., male Today's Date: 05/06/2022  PCP: Dr. Glendon Axe REFERRING PROVIDER: Courtney Heys, MD    PT End of Session - 05/06/22 1450     Visit Number 17    Number of Visits 27    Date for PT Re-Evaluation 05/31/22    Authorization Type BCBS (60 visit limit annually)    PT Start Time 1448    PT Stop Time 1529    PT Time Calculation (min) 41 min    Equipment Utilized During Treatment Gait belt    Activity Tolerance Patient tolerated treatment well    Behavior During Therapy WFL for tasks assessed/performed             Past Medical History:  Diagnosis Date   Anxiety    Asthma    as a child   Depression    ED (erectile dysfunction)    GERD (gastroesophageal reflux disease)    Hx of spinal cord injury 03/2020   Hypertension    Neurogenic bladder    self caths   Neurogenic bowel    has to be digitially stimulated - 04/24/21   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[place])    bilateral legs   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[place])    Past Surgical History:  Procedure Laterality Date   Anorectal biospy  02/09/2021   Colon Biospy  02/09/2021   IR ANGIO INTRA EXTRACRAN SEL COM CAROTID INNOMINATE BILAT MOD SED  04/25/2021   IR ANGIO VERTEBRAL SEL SUBCLAVIAN INNOMINATE UNI L MOD SED  04/25/2021   IR ANGIO VERTEBRAL SEL VERTEBRAL UNI R MOD SED  04/25/2021   IR ANGIO/SPINAL LEFT  04/25/2021   IR ANGIO/SPINAL LEFT  04/25/2021   IR ANGIO/SPINAL LEFT  04/25/2021   IR ANGIO/SPINAL LEFT  04/25/2021   IR ANGIO/SPINAL LEFT  04/25/2021   IR ANGIO/SPINAL LEFT  04/25/2021   IR ANGIO/SPINAL LEFT  04/25/2021   IR ANGIO/SPINAL LEFT  04/25/2021   IR ANGIO/SPINAL LEFT  04/25/2021   IR ANGIO/SPINAL LEFT  04/25/2021   IR ANGIO/SPINAL LEFT  04/25/2021   IR ANGIO/SPINAL LEFT  04/25/2021   IR ANGIO/SPINAL LEFT  04/25/2021   IR ANGIO/SPINAL RIGHT  04/25/2021   IR ANGIO/SPINAL RIGHT  04/25/2021   IR ANGIO/SPINAL RIGHT  04/25/2021   IR  ANGIO/SPINAL RIGHT  04/25/2021   IR ANGIO/SPINAL RIGHT  04/25/2021   IR ANGIO/SPINAL RIGHT  04/25/2021   IR ANGIO/SPINAL RIGHT  04/25/2021   IR ANGIO/SPINAL RIGHT  04/25/2021   IR ANGIO/SPINAL RIGHT  04/25/2021   IR ANGIO/SPINAL RIGHT  04/25/2021   IR ANGIO/SPINAL RIGHT  04/25/2021   IR ANGIO/SPINAL RIGHT  04/25/2021   IR ANGIO/SPINAL RIGHT  04/25/2021   IR ANGIO/SPINAL RIGHT  04/25/2021   IR ANGIOGRAM EXTREMITY BILATERAL  04/25/2021   IR RADIOLOGIST EVAL & MGMT  03/21/2021   RADIOLOGY WITH ANESTHESIA N/A 04/25/2021   Procedure: IR WITH ANESTHESIA SPINAL ANGIOGRAM;  Surgeon: Luanne Bras, MD;  Location[facility];  Service: Radiology;  Laterality: N/A;   Patient Active Problem List   Diagnosis Date Noted   Disease of spinal cord ([place]) 05/21/2021   Anti-RNP antibodies present 05/21/2021   Weakness of both lower extremities 02/05/2021   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   Neurogenic bladder 12/08/2020   Neurogenic bowel 12/08/2020   Paraplegia following spinal cord injury ([place]) 12/08/2020   Wheelchair dependence 12/08/2020   Spasticity 12/08/2020   Chronic bilateral thoracic back pain 12/08/2020  ONSET DATE: 02/04/22 (date of referral)  REFERRING DIAG: G37.3 (ICD-10-CM) - 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[place]) G82.20 (ICD-10-CM) - Paraplegia following spinal cord injury ([place])   THERAPY DIAG:  Other abnormalities of gait and mobility  Muscle weakness (generalized)  Unsteadiness on feet  Abnormal posture  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[facility])  SUBJECTIVE:                                                                                                                                                                                              SUBJECTIVE STATEMENT: Had appt with [name] on last Friday and has AFO ordered, should get it in 2 weeks.  Had a good trip.  He is trying to walk without AD with supervision more in home with parents.  He states he is using the RW 80% of the time to get around and w/c  for longer community distances like in the mall.  Pt accompanied by: family member and self  - dad is present today  PERTINENT HISTORY: Transverse myelitis, neurogenic bowel and bladder  PAIN:  Are you having pain? NO  PRECAUTIONS: None  WEIGHT BEARING RESTRICTIONS No  FALLS: Has patient fallen in last 6 months? No  LIVING ENVIRONMENT: Lives with:  mother and father Lives in: House/apartment Stairs: Yes: Internal: 12 steps; on right going up and on left going up and External: 1 steps; none, pt has chair lift at home for stairs. Has following equipment at home: Gilford Rile - 2 wheeled, Wheelchair (manual), and bed side commode  PLOF:  Independent with dressing, self care, transfer, parents help with cooking, cleaning.  PATIENT GOALS Improve strength in R leg, improve walking  OBJECTIVE:   MMT Right EAVL Left EVAL  Hip flexion 1+ 3+  Hip extension    Hip abduction 1+ 3+  Hip adduction 1+ 3+  Hip internal rotation    Hip external rotation    Knee flexion 1+ 4  Knee extension 1+ 4  Ankle dorsiflexion 1+ 3+  Ankle plantarflexion 1+ 3+  Ankle inversion    Ankle eversion    (Blank rows = not tested)  Pt uses manual wheelchair when he goes to MD appts or stores. Inside the house he uses walker for household mobility.  FUNCTIONAL TESTs:  5 times sit to stand: 47 sec Timed up and go (TUG): 1 min with RW 10 meter walk test: 0.27 m/s with RW  TODAY'S TREATMENT:  Assessed goals for 30 day update: -10MWT w/ RW and R posterior [place] AFO:  21.60 sec = 0.46 m/sec OR 1.53 ft/sec -TUG w/ RW:  24.13 sec -5xSTS w/ BUE support: 26.57 sec -Ambulates  71' w/ RW and R posterior [place] AFO, RLE toe drag due to fatigue on last 100' w/ pt compensating with inc knee flexion.  No LOB.  Supervision level.  PATIENT EDUCATION: Education details: Continue HEP.   Person educated: Patient Education method: Explanation Education comprehension: verbalized understanding   HOME EXERCISE  PROGRAM: Access Code: [email or web address] URL: [email or web address] Date: 04/01/2022 Prepared by: Elease Etienne  Exercises - Sit to Stand with Resistance Around Legs  - 1 x daily - 5 x weekly - 2 sets - 8 reps - Side Stepping with Resistance at Thighs and Counter Support  - 1 x daily - 5 x weekly - 3 sets - 10 reps - Standing Single Leg Stance with Counter Support  - 1 x daily - 5 x weekly - 1 sets - 5 reps - work to 30 seconds hold - Side-lying Knee Flexion  - 1 x daily - 5 x weekly - 3 sets - 3-5 reps - Walking with Counter Support  - 1 x daily - 5 x weekly - 3 sets - 10 reps-w/ red theraband at thighs - Romberg Stance on Foam Pad with Head Rotation  - 1 x daily - 5 x weekly - 1 sets - 3-4 reps - 30 seconds hold - Tandem Stance on Foam Pad with Eyes Open  - 1 x daily - 5 x weekly - 1 sets - 3-4 reps - 30 seconds hold - Tandem Stance on Foam Pad with Eyes Closed  - 1 x daily - 5 x weekly - 1 sets - 3-4 reps - 30 seconds hold  Added on 03/01/2022  Pt educated on trying L sidelying with hooklying position: R UE providing  AA hip flexion to R hip and then pt pushes leg into extension with UE resisting it. Pt educated to perform alternating AA flexion and resisted extension for 3 x 10 daily for HEP  Goals reviewed with patient? Yes UPDATED GOALS: STGs=LONG TERM GOALS: Target date: 05/03/2022 (assess and update every 30 days)   Patient will demo gait speed with LRAD of at least 0.18m/s to improve his functional independence. Baseline: 0.35m/s with RW (02/08/22); 0.55 m/sec w/ RW (04/03/2022); 0.46 m/sec w/ RW and right posterior [place] AFO (05/06/2022) Goal status: ONGOING  2.  Pt will demo TUG score of <20 sec with LRAD to improve functional mobility. Baseline: 1 min (02/08/22); 22.56 sec w/ RW (04/03/2022); 24.13 sec w/ RW (05/06/2022) Goal status: ONGOING  3.  Patient will be able to perform 5x sit to stand with use of bil UE support in <20 seconds to improve functional LE strength and  power. Baseline: 47 seconds (01/29/22); 27 sec w/ BUE support (04/03/2022); 26.57 sec (05/06/2022) Goal status: ONGOING  4.  Pt will ambulate 500' w/ LRAD and right posterior [place] AFO at supervision level to improve functional mobility to limited community distances.  Baseline:  37' w/ RW SBA/CGA; 432' w/ RW and right posterior [place] AFO supervision (05/06/2022)  Goal status:  REVISED  5.  Stair training will be initiated with caregiver training for guarding safety to improve safe independence with access to home environment.  Baseline:  not initiated.  Goal status:  INITIAL  ASSESSMENT:  CLINICAL IMPRESSION:    OBJECTIVE IMPAIRMENTS Abnormal gait, decreased activity tolerance, decreased balance, decreased endurance, decreased mobility, difficulty walking, decreased ROM, decreased strength, hypomobility, increased fascial restrictions, impaired flexibility, impaired sensation, impaired tone, improper body mechanics, postural dysfunction, and pain.   ACTIVITY LIMITATIONS cleaning, community activity, driving, meal prep, laundry,  and yard work.   PERSONAL FACTORS Age, Past/current experiences, and Time since onset of injury/illness/exacerbation are also affecting patient's functional outcome.    REHAB POTENTIAL: Good  CLINICAL DECISION MAKING: Stable/uncomplicated  EVALUATION COMPLEXITY: Low  PLAN: PT FREQUENCY: 2x/week  PT DURATION: other: 5 weeks  (re-cert date to accommodate pt out-of-town for 2 weeks and potential delay in scheduling)  PLANNED INTERVENTIONS: Therapeutic exercises, Therapeutic activity, Neuromuscular re-education, Balance training, Gait training, Patient/Family education, Joint mobilization, Stair training, Orthotic/Fit training, Aquatic Therapy, Spinal mobilization, Cryotherapy, Moist heat, and Manual therapy  PLAN FOR NEXT SESSION:  Single leg on leg press.  SciFit.  Treadmill training-harness?  Continue to work with loft strand crutches and work on standing  balance as needed.  Progress gait w/o AD vs cane-use of R posterior [place].  Gait training w/ LRAD over unlevel surfaces.  Initiate stair training as able (step taps > step ups w/ BUE support)  Bary Richard, PT, DPT 05/06/2022, 3:30 PM

## 2022-05-08 ENCOUNTER — Encounter: Payer: Self-pay | Admitting: Physical Therapy

## 2022-05-08 ENCOUNTER — Ambulatory Visit: Payer: BC Managed Care – PPO | Admitting: Physical Therapy

## 2022-05-08 DIAGNOSIS — R2689 Other abnormalities of gait and mobility: Secondary | ICD-10-CM

## 2022-05-08 DIAGNOSIS — R293 Abnormal posture: Secondary | ICD-10-CM

## 2022-05-08 DIAGNOSIS — M6281 Muscle weakness (generalized): Secondary | ICD-10-CM

## 2022-05-08 DIAGNOSIS — R2681 Unsteadiness on feet: Secondary | ICD-10-CM

## 2022-05-08 DIAGNOSIS — G373 Acute transverse myelitis in demyelinating disease of central nervous system: Secondary | ICD-10-CM

## 2022-05-08 NOTE — Therapy (Signed)
OUTPATIENT PHYSICAL THERAPY TREATMENT NOTE   Patient Name: Maurice Cruz MRN: [id number] DOB:1966/08/30, 55 y.o., male Today's Date: 05/08/2022  PCP: Dr. Glendon Axe REFERRING PROVIDER: Courtney Heys, MD    PT End of Session - 05/08/22 1454     Visit Number 18    Number of Visits 27    Date for PT Re-Evaluation 05/31/22    Authorization Type BCBS (60 visit limit annually)    PT Start Time 1450    PT Stop Time 1536    PT Time Calculation (min) 46 min    Equipment Utilized During Treatment Gait belt    Activity Tolerance Patient tolerated treatment well    Behavior During Therapy WFL for tasks assessed/performed             Past Medical History:  Diagnosis Date   Anxiety    Asthma    as a child   Depression    ED (erectile dysfunction)    GERD (gastroesophageal reflux disease)    Hx of spinal cord injury 03/2020   Hypertension    Neurogenic bladder    self caths   Neurogenic bowel    has to be digitially stimulated - 04/24/21   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[place])    bilateral legs   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[place])    Past Surgical History:  Procedure Laterality Date   Anorectal biospy  02/09/2021   Colon Biospy  02/09/2021   IR ANGIO INTRA EXTRACRAN SEL COM CAROTID INNOMINATE BILAT MOD SED  04/25/2021   IR ANGIO VERTEBRAL SEL SUBCLAVIAN INNOMINATE UNI L MOD SED  04/25/2021   IR ANGIO VERTEBRAL SEL VERTEBRAL UNI R MOD SED  04/25/2021   IR ANGIO/SPINAL LEFT  04/25/2021   IR ANGIO/SPINAL LEFT  04/25/2021   IR ANGIO/SPINAL LEFT  04/25/2021   IR ANGIO/SPINAL LEFT  04/25/2021   IR ANGIO/SPINAL LEFT  04/25/2021   IR ANGIO/SPINAL LEFT  04/25/2021   IR ANGIO/SPINAL LEFT  04/25/2021   IR ANGIO/SPINAL LEFT  04/25/2021   IR ANGIO/SPINAL LEFT  04/25/2021   IR ANGIO/SPINAL LEFT  04/25/2021   IR ANGIO/SPINAL LEFT  04/25/2021   IR ANGIO/SPINAL LEFT  04/25/2021   IR ANGIO/SPINAL LEFT  04/25/2021   IR ANGIO/SPINAL RIGHT  04/25/2021   IR ANGIO/SPINAL RIGHT  04/25/2021   IR ANGIO/SPINAL RIGHT  04/25/2021   IR  ANGIO/SPINAL RIGHT  04/25/2021   IR ANGIO/SPINAL RIGHT  04/25/2021   IR ANGIO/SPINAL RIGHT  04/25/2021   IR ANGIO/SPINAL RIGHT  04/25/2021   IR ANGIO/SPINAL RIGHT  04/25/2021   IR ANGIO/SPINAL RIGHT  04/25/2021   IR ANGIO/SPINAL RIGHT  04/25/2021   IR ANGIO/SPINAL RIGHT  04/25/2021   IR ANGIO/SPINAL RIGHT  04/25/2021   IR ANGIO/SPINAL RIGHT  04/25/2021   IR ANGIO/SPINAL RIGHT  04/25/2021   IR ANGIOGRAM EXTREMITY BILATERAL  04/25/2021   IR RADIOLOGIST EVAL & MGMT  03/21/2021   RADIOLOGY WITH ANESTHESIA N/A 04/25/2021   Procedure: IR WITH ANESTHESIA SPINAL ANGIOGRAM;  Surgeon: Luanne Bras, MD;  Location: [facility];  Service: Radiology;  Laterality: N/A;   Patient Active Problem List   Diagnosis Date Noted   Disease of spinal cord ([place]) 05/21/2021   Anti-RNP antibodies present 05/21/2021   Weakness of both lower extremities 02/05/2021   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) 12/08/2020   Wheelchair dependence 12/08/2020   Spasticity 12/08/2020   Chronic bilateral thoracic back pain 12/08/2020  ONSET DATE: 02/04/22 (date of referral)  REFERRING DIAG: G37.3 (ICD-10-CM) - 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[place]) G82.20 (ICD-10-CM) - Paraplegia following spinal cord injury ([place])   THERAPY DIAG:  Other abnormalities of gait and mobility  Muscle weakness (generalized)  Unsteadiness on feet  Abnormal posture  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[facility])  SUBJECTIVE:                                                                                                                                                                                              SUBJECTIVE STATEMENT: Pt denies falls or other acute issues.  He is working on [employer].  Pt accompanied by: family member and self  - dad is present today  PERTINENT HISTORY: Transverse myelitis, neurogenic bowel and bladder  PAIN:  Are you having pain?  NO  PRECAUTIONS: None  WEIGHT BEARING RESTRICTIONS No  FALLS: Has patient fallen in last 6 months? No  LIVING ENVIRONMENT: Lives with:  mother and father Lives in: House/apartment Stairs: Yes: Internal: 12 steps; on right going up and on left going up and External: 1 steps; none, pt has chair lift at home for stairs. Has following equipment at home: Gilford Rile - 2 wheeled, Wheelchair (manual), and bed side commode  PLOF:  Independent with dressing, self care, transfer, parents help with cooking, cleaning.  PATIENT GOALS Improve strength in R leg, improve walking  OBJECTIVE:   MMT Right EAVL Left EVAL  Hip flexion 1+ 3+  Hip extension    Hip abduction 1+ 3+  Hip adduction 1+ 3+  Hip internal rotation    Hip external rotation    Knee flexion 1+ 4  Knee extension 1+ 4  Ankle dorsiflexion 1+ 3+  Ankle plantarflexion 1+ 3+  Ankle inversion    Ankle eversion    (Blank rows = not tested)  Pt uses manual wheelchair when he goes to MD appts or stores. Inside the house he uses walker for household mobility.  FUNCTIONAL TESTs:  5 times sit to stand: 47 sec Timed up and go (TUG): 1 min with RW 10 meter walk test: 0.27 m/s with RW  TODAY'S TREATMENT:  Leg press BLE 80# 2x10 reps > LLE 10x40# > RLE 10x30#  STAIRS:  Level of Assistance: SBA and CGA  Stair Negotiation Technique: Step to Pattern Forwards with Single Rail on Left  Number of Stairs: 8   Height of Stairs: 6"  Comments: Pt uses bilateral UE on left rail w/ feet facing forward, he demonstrates quick fatigue on RLE with difficulty clearing RLE during descent.  Cued for weight shift to improve ease of RLE clearance.  He would benefit  from further trials of stair training to build tolerance and safe independence with use of at least one rail.  GAIT: Gait pattern: step through pattern and decreased ankle dorsiflexion- Right Distance walked: 115' (left loftstrand) + 115' (right loftstrand-pt preference) + 230' (bilateral  loftstrands) Assistive device utilized: Crutches-loftstrands Level of assistance: Modified independence and SBA Comments: Pt ambulates w/ inc pace using bilateral loftstrands and good stride.  His balance remains intact w/ single loftstrand w/ intermittent right crossover step, but endurance becomes a challenge.   PATIENT EDUCATION: Education details: Continue HEP. Person educated: Patient Education method: Explanation Education comprehension: verbalized understanding   HOME EXERCISE PROGRAM: Access Code: [email or web address] URL: [email or web address] Date: 04/01/2022 Prepared by: Elease Etienne  Exercises - Sit to Stand with Resistance Around Legs  - 1 x daily - 5 x weekly - 2 sets - 8 reps - Side Stepping with Resistance at Thighs and Counter Support  - 1 x daily - 5 x weekly - 3 sets - 10 reps - Standing Single Leg Stance with Counter Support  - 1 x daily - 5 x weekly - 1 sets - 5 reps - work to 30 seconds hold - Side-lying Knee Flexion  - 1 x daily - 5 x weekly - 3 sets - 3-5 reps - Walking with Counter Support  - 1 x daily - 5 x weekly - 3 sets - 10 reps-w/ red theraband at thighs - Romberg Stance on Foam Pad with Head Rotation  - 1 x daily - 5 x weekly - 1 sets - 3-4 reps - 30 seconds hold - Tandem Stance on Foam Pad with Eyes Open  - 1 x daily - 5 x weekly - 1 sets - 3-4 reps - 30 seconds hold - Tandem Stance on Foam Pad with Eyes Closed  - 1 x daily - 5 x weekly - 1 sets - 3-4 reps - 30 seconds hold  Added on 03/01/2022  Pt educated on trying L sidelying with hooklying position: R UE providing  AA hip flexion to R hip and then pt pushes leg into extension with UE resisting it. Pt educated to perform alternating AA flexion and resisted extension for 3 x 10 daily for HEP  Goals reviewed with patient? Yes UPDATED GOALS: STGs=LONG TERM GOALS: Target date: 05/03/2022 (assess and update every 30 days)   Patient will demo gait speed with LRAD of at least 0.21m/s to improve his  functional independence. Baseline: 0.31m/s with RW (02/08/22); 0.55 m/sec w/ RW (04/03/2022); 0.46 m/sec w/ RW and right posterior [place] AFO (05/06/2022) Goal status: ONGOING  2.  Pt will demo TUG score of <20 sec with LRAD to improve functional mobility. Baseline: 1 min (02/08/22); 22.56 sec w/ RW (04/03/2022); 24.13 sec w/ RW (05/06/2022) Goal status: ONGOING  3.  Patient will be able to perform 5x sit to stand with use of bil UE support in <20 seconds to improve functional LE strength and power. Baseline: 47 seconds (01/29/22); 27 sec w/ BUE support (04/03/2022); 26.57 sec (05/06/2022) Goal status: ONGOING  4.  Pt will ambulate 500' w/ LRAD and right posterior [place] AFO at supervision level to improve functional mobility to limited community distances.  Baseline:  26' w/ RW SBA/CGA; 432' w/ RW and right posterior [place] AFO supervision (05/06/2022)  Goal status:  REVISED  5.  Stair training will be initiated with caregiver training for guarding safety to improve safe independence with access to home environment.  Baseline:  To be initiated next visit.  Goal  status:  ONGOING  ASSESSMENT:  CLINICAL IMPRESSION: Focus of session on gait training with use of loftstrand crutches.  Pt demonstrates good balance and sequencing with loftstrands, but improved endurance with bilateral vs single crutch.  Initiated stair training this session with RLE fatiguing quickly with difficulty clearing during descent.  Will continue working on stair negotiation and gait training in addition to generalized BLE strength in coming sessions.   OBJECTIVE IMPAIRMENTS Abnormal gait, decreased activity tolerance, decreased balance, decreased endurance, decreased mobility, difficulty walking, decreased ROM, decreased strength, hypomobility, increased fascial restrictions, impaired flexibility, impaired sensation, impaired tone, improper body mechanics, postural dysfunction, and pain.   ACTIVITY LIMITATIONS cleaning, community  activity, driving, meal prep, laundry, and yard work.   PERSONAL FACTORS Age, Past/current experiences, and Time since onset of injury/illness/exacerbation are also affecting patient's functional outcome.    REHAB POTENTIAL: Good  CLINICAL DECISION MAKING: Stable/uncomplicated  EVALUATION COMPLEXITY: Low  PLAN: PT FREQUENCY: 2x/week  PT DURATION: other: 5 weeks  (re-cert date to accommodate pt out-of-town for 2 weeks and potential delay in scheduling)  PLANNED INTERVENTIONS: Therapeutic exercises, Therapeutic activity, Neuromuscular re-education, Balance training, Gait training, Patient/Family education, Joint mobilization, Stair training, Orthotic/Fit training, Aquatic Therapy, Spinal mobilization, Cryotherapy, Moist heat, and Manual therapy  PLAN FOR NEXT SESSION:  Review/modify HEP.  Single leg on leg press.  SciFit.  Treadmill training-harness?  Continue to work with loft strand crutches and work on standing balance as needed.  Progress gait w/o AD vs cane-use of R posterior [place].  Gait training w/ LRAD over unlevel surfaces.  Continue stair training (step taps > step ups w/ BUE support)  Bary Richard, PT, DPT 05/08/2022, 3:44 PM

## 2022-05-13 ENCOUNTER — Ambulatory Visit: Payer: BC Managed Care – PPO | Admitting: Physical Therapy

## 2022-05-13 ENCOUNTER — Encounter: Payer: Self-pay | Admitting: Physical Therapy

## 2022-05-13 DIAGNOSIS — R2689 Other abnormalities of gait and mobility: Secondary | ICD-10-CM

## 2022-05-13 DIAGNOSIS — M6281 Muscle weakness (generalized): Secondary | ICD-10-CM

## 2022-05-13 DIAGNOSIS — R293 Abnormal posture: Secondary | ICD-10-CM

## 2022-05-13 DIAGNOSIS — R2681 Unsteadiness on feet: Secondary | ICD-10-CM

## 2022-05-13 NOTE — Therapy (Signed)
OUTPATIENT PHYSICAL THERAPY TREATMENT NOTE   Patient Name: Maurice Cruz MRN: [id number] DOB:[birth year], 56 y.o., male Today's Date: 05/13/2022  PCP: Dr. Glendon Axe REFERRING PROVIDER: Courtney Heys, MD    PT End of Session - 05/13/22 1454     Visit Number 19    Number of Visits 27    Date for PT Re-Evaluation 05/31/22    Authorization Type [employer] (60 visit limit annually)    PT Start Time 1450   pt ran over with pt prior   PT Stop Time 1529    PT Time Calculation (min) 39 min    Equipment Utilized During Treatment Gait belt    Activity Tolerance Patient tolerated treatment well    Behavior During Therapy WFL for tasks assessed/performed             Past Medical History:  Diagnosis Date   Anxiety    Asthma    as a child   Depression    ED (erectile dysfunction)    GERD (gastroesophageal reflux disease)    Hx of spinal cord injury 03/2020   Hypertension    Neurogenic bladder    self caths   Neurogenic bowel    has to be digitially stimulated - 04/24/21   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[place])    bilateral legs   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[place])    Past Surgical History:  Procedure Laterality Date   Anorectal biospy  02/09/2021   Colon Biospy  02/09/2021   IR ANGIO INTRA EXTRACRAN SEL COM CAROTID INNOMINATE BILAT MOD SED  04/25/2021   IR ANGIO VERTEBRAL SEL SUBCLAVIAN INNOMINATE UNI L MOD SED  04/25/2021   IR ANGIO VERTEBRAL SEL VERTEBRAL UNI R MOD SED  04/25/2021   IR ANGIO/SPINAL LEFT  04/25/2021   IR ANGIO/SPINAL LEFT  04/25/2021   IR ANGIO/SPINAL LEFT  04/25/2021   IR ANGIO/SPINAL LEFT  04/25/2021   IR ANGIO/SPINAL LEFT  04/25/2021   IR ANGIO/SPINAL LEFT  04/25/2021   IR ANGIO/SPINAL LEFT  04/25/2021   IR ANGIO/SPINAL LEFT  04/25/2021   IR ANGIO/SPINAL LEFT  04/25/2021   IR ANGIO/SPINAL LEFT  04/25/2021   IR ANGIO/SPINAL LEFT  04/25/2021   IR ANGIO/SPINAL LEFT  04/25/2021   IR ANGIO/SPINAL LEFT  04/25/2021   IR ANGIO/SPINAL RIGHT  04/25/2021   IR ANGIO/SPINAL RIGHT  04/25/2021   IR  ANGIO/SPINAL RIGHT  04/25/2021   IR ANGIO/SPINAL RIGHT  04/25/2021   IR ANGIO/SPINAL RIGHT  04/25/2021   IR ANGIO/SPINAL RIGHT  04/25/2021   IR ANGIO/SPINAL RIGHT  04/25/2021   IR ANGIO/SPINAL RIGHT  04/25/2021   IR ANGIO/SPINAL RIGHT  04/25/2021   IR ANGIO/SPINAL RIGHT  04/25/2021   IR ANGIO/SPINAL RIGHT  04/25/2021   IR ANGIO/SPINAL RIGHT  04/25/2021   IR ANGIO/SPINAL RIGHT  04/25/2021   IR ANGIO/SPINAL RIGHT  04/25/2021   IR ANGIOGRAM EXTREMITY BILATERAL  04/25/2021   IR RADIOLOGIST EVAL & MGMT  03/21/2021   RADIOLOGY WITH ANESTHESIA N/A 04/25/2021   Procedure: IR WITH ANESTHESIA SPINAL ANGIOGRAM;  Surgeon: Luanne Bras, MD;  Location: [facility];  Service: Radiology;  Laterality: N/A;   Patient Active Problem List   Diagnosis Date Noted   Disease of spinal cord ([place]) 05/21/2021   Anti-RNP antibodies present 05/21/2021   Weakness of both lower extremities 02/05/2021   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   Neurogenic bladder 12/08/2020   Neurogenic bowel 12/08/2020   Paraplegia following spinal cord injury ([place]) 12/08/2020   Wheelchair dependence 12/08/2020   Spasticity 12/08/2020   Chronic bilateral  thoracic back pain 12/08/2020    ONSET DATE: 02/04/22 (date of referral)  REFERRING DIAG: G37.3 (ICD-10-CM) - 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[place]) G82.20 (ICD-10-CM) - Paraplegia following spinal cord injury ([place])   THERAPY DIAG:  Other abnormalities of gait and mobility  Muscle weakness (generalized)  Unsteadiness on feet  Abnormal posture  SUBJECTIVE:                                                                                                                                                                                              SUBJECTIVE STATEMENT: Pt denies falls or near falls.  He has been walking more than anything as he left his exercise printoff in the airbnb in [place].  Pt accompanied by: family member and self  - dad is present today  PERTINENT HISTORY: Transverse  myelitis, neurogenic bowel and bladder  PAIN:  PAIN:  Are you having pain? Yes: NPRS scale: 2/10 Pain location: low back Pain description: sore, achy Aggravating factors: walking Relieving factors: nothing   PRECAUTIONS: None  WEIGHT BEARING RESTRICTIONS No  FALLS: Has patient fallen in last 6 months? No  LIVING ENVIRONMENT: Lives with:  mother and father Lives in: House/apartment Stairs: Yes: Internal: 12 steps; on right going up and on left going up and External: 1 steps; none, pt has chair lift at home for stairs. Has following equipment at home: Gilford Rile - 2 wheeled, Wheelchair (manual), and bed side commode  PLOF:  Independent with dressing, self care, transfer, parents help with cooking, cleaning.  PATIENT GOALS Improve strength in R leg, improve walking  OBJECTIVE:   MMT Right EAVL Left EVAL  Hip flexion 1+ 3+  Hip extension    Hip abduction 1+ 3+  Hip adduction 1+ 3+  Hip internal rotation    Hip external rotation    Knee flexion 1+ 4  Knee extension 1+ 4  Ankle dorsiflexion 1+ 3+  Ankle plantarflexion 1+ 3+  Ankle inversion    Ankle eversion    (Blank rows = not tested)  Pt uses manual wheelchair when he goes to MD appts or stores. Inside the house he uses walker for household mobility.  FUNCTIONAL TESTs:  5 times sit to stand: 47 sec Timed up and go (TUG): 1 min with RW 10 meter walk test: 0.27 m/s with RW  TODAY'S TREATMENT:  Reviewed HEP: -STS w/ green theraband around thighs x10 using UE -side-lying knee flexion x5 each side, added pillow for hip positioning -side stepping w/ green theraband at thighs > ankles 4x10', pt is most appropriate for use of red band around ankles or green band at thighs -SLS w/  BUE on counter, using toe to prop RLE behind, 2x30sec each LE -Walking at countertop distant supervision 6x10' -tandem on pillows w/ EO >EC x30 sec each, requires single finger support on back of chair with EC -rhomberg stance on pillows w/ EO  head rotation x30sec-->no sway noted, removed from HEP -AAROM hip flexion/extension in side-lying  PATIENT EDUCATION: Education details: Continue with HEP modifications.  Walk w/ supervision frequently. Person educated: Patient Education method: Explanation Education comprehension: verbalized understanding   HOME EXERCISE PROGRAM: Access Code: [email or web address] URL: [email or web address] Date: 05/13/2022 Prepared by: Elease Etienne  Exercises - Sit to Stand with Resistance Around Legs  - 1 x daily - 5 x weekly - 2 sets - 8 reps - Standing Single Leg Stance with Counter Support  - 1 x daily - 5 x weekly - 1 sets - 5 reps - work to 30 seconds hold - Side-lying Knee Flexion  - 1 x daily - 4-5 x weekly - 2-3 sets - 3-5 reps - Walking with Counter Support  - 1 x daily - 7 x weekly - 4-5 sets - 10 reps - Tandem Stance on Foam Pad with Eyes Open  - 1 x daily - 5 x weekly - 1 sets - 3-4 reps - 30 seconds hold - Tandem Stance on Foam Pad with Eyes Closed  - 1 x daily - 5 x weekly - 1 sets - 3-4 reps - 30 seconds hold - Side Stepping with Resistance at Ankles and Counter Support  - 1 x daily - 7 x weekly - 3 sets - 10 reps  Added on 03/01/2022  Pt educated on trying L sidelying with hooklying position: R UE providing  AA hip flexion to R hip and then pt pushes leg into extension with UE resisting it. Pt educated to perform alternating AA flexion and resisted extension for 3 x 10 daily for HEP  Goals reviewed with patient? Yes UPDATED GOALS: STGs=LONG TERM GOALS: Target date: 05/03/2022 (assess and update every 30 days)   Patient will demo gait speed with LRAD of at least 0.27m/s to improve his functional independence. Baseline: 0.79m/s with RW (02/08/22); 0.55 m/sec w/ RW (04/03/2022); 0.46 m/sec w/ RW and right posterior [place] AFO (05/06/2022) Goal status: ONGOING  2.  Pt will demo TUG score of <20 sec with LRAD to improve functional mobility. Baseline: 1 min (02/08/22); 22.56 sec w/ RW  (04/03/2022); 24.13 sec w/ RW (05/06/2022) Goal status: ONGOING  3.  Patient will be able to perform 5x sit to stand with use of bil UE support in <20 seconds to improve functional LE strength and power. Baseline: 47 seconds (01/29/22); 27 sec w/ BUE support (04/03/2022); 26.57 sec (05/06/2022) Goal status: ONGOING  4.  Pt will ambulate 500' w/ LRAD and right posterior [place] AFO at supervision level to improve functional mobility to limited community distances.  Baseline:  58' w/ RW SBA/CGA; 432' w/ RW and right posterior [place] AFO supervision (05/06/2022)  Goal status:  REVISED  5.  Stair training will be initiated with caregiver training for guarding safety to improve safe independence with access to home environment.  Baseline:  To be initiated next visit.  Goal status:  ONGOING  ASSESSMENT:  CLINICAL IMPRESSION: Session aimed at condensing HEP with review to ensure current exercises meet patient's current functional level.  His overall static balance continues to progress w/ patient better able to stabilize in tandem and with narrowed BOS.  His gait continues to progress with patient likely to benefit from  continued upper trunk, UE, and core exercise to promote tolerance of potential use of less restrictive AD like loftstrand crutches.  Will address these areas next session per skilled PT POC.   OBJECTIVE IMPAIRMENTS Abnormal gait, decreased activity tolerance, decreased balance, decreased endurance, decreased mobility, difficulty walking, decreased ROM, decreased strength, hypomobility, increased fascial restrictions, impaired flexibility, impaired sensation, impaired tone, improper body mechanics, postural dysfunction, and pain.   ACTIVITY LIMITATIONS cleaning, community activity, driving, meal prep, laundry, and yard work.   PERSONAL FACTORS Age, Past/current experiences, and Time since onset of injury/illness/exacerbation are also affecting patient's functional outcome.    REHAB  POTENTIAL: Good  CLINICAL DECISION MAKING: Stable/uncomplicated  EVALUATION COMPLEXITY: Low  PLAN: PT FREQUENCY: 2x/week  PT DURATION: other: 5 weeks  (re-cert date to accommodate pt out-of-town for 2 weeks and potential delay in scheduling)  PLANNED INTERVENTIONS: Therapeutic exercises, Therapeutic activity, Neuromuscular re-education, Balance training, Gait training, Patient/Family education, Joint mobilization, Stair training, Orthotic/Fit training, Aquatic Therapy, Spinal mobilization, Cryotherapy, Moist heat, and Manual therapy  PLAN FOR NEXT SESSION:  UE/Core/upper trunk therex.  Review/modify HEP prn.  Single leg on leg press.  SciFit.  Treadmill training-harness?  Continue to work with loft strand crutches and work on standing balance as needed.  Progress gait w/o AD vs cane-use of R posterior [place].  Gait training w/ LRAD over unlevel surfaces.  Continue stair training  Bary Richard, PT, DPT 05/13/2022, 4:30 PM

## 2022-05-13 NOTE — Patient Instructions (Addendum)
Access Code: [email or web address] URL: [email or web address] Date: 05/13/2022 Prepared by: Elease Etienne  Exercises - Sit to Stand with Resistance Around Legs  - 1 x daily - 5 x weekly - 2 sets - 8 reps - Standing Single Leg Stance with Counter Support  - 1 x daily - 5 x weekly - 1 sets - 5 reps - work to 30 seconds hold - Side-lying Knee Flexion  - 1 x daily - 4-5 x weekly - 2-3 sets - 3-5 reps - Walking with Counter Support  - 1 x daily - 7 x weekly - 4-5 sets - 10 reps - Tandem Stance on Foam Pad with Eyes Open  - 1 x daily - 5 x weekly - 1 sets - 3-4 reps - 30 seconds hold - Tandem Stance on Foam Pad with Eyes Closed  - 1 x daily - 5 x weekly - 1 sets - 3-4 reps - 30 seconds hold - Side Stepping with Resistance at Ankles and Counter Support  - 1 x daily - 7 x weekly - 3 sets - 10 reps  Added on 03/01/2022  Pt educated on trying L sidelying with hooklying position: R UE providing  AA hip flexion to R hip and then pt pushes leg into extension with UE resisting it. Pt educated to perform alternating AA flexion and resisted extension for 3 x 10 daily for HEP

## 2022-05-15 ENCOUNTER — Encounter: Payer: Self-pay | Admitting: Physical Therapy

## 2022-05-15 ENCOUNTER — Ambulatory Visit: Payer: BC Managed Care – PPO | Admitting: Physical Therapy

## 2022-05-15 VITALS — Wt 176.0 lb

## 2022-05-15 DIAGNOSIS — R2689 Other abnormalities of gait and mobility: Secondary | ICD-10-CM | POA: Diagnosis not present

## 2022-05-15 DIAGNOSIS — R2681 Unsteadiness on feet: Secondary | ICD-10-CM

## 2022-05-15 DIAGNOSIS — R293 Abnormal posture: Secondary | ICD-10-CM

## 2022-05-15 DIAGNOSIS — M6281 Muscle weakness (generalized): Secondary | ICD-10-CM

## 2022-05-15 NOTE — Therapy (Signed)
OUTPATIENT PHYSICAL THERAPY TREATMENT NOTE   Patient Name: Maurice Cruz MRN: [id number] DOB:04/23/1966, 56 y.o., male Today's Date: 05/15/2022  PCP: Dr. Glendon Axe REFERRING PROVIDER: Courtney Heys, MD    PT End of Session - 05/15/22 1451     Visit Number 20    Number of Visits 27    Date for PT Re-Evaluation 05/31/22    Authorization Type BCBS (60 visit limit annually)    PT Start Time 1448    PT Stop Time 1536    PT Time Calculation (min) 48 min    Equipment Utilized During Treatment Gait belt    Activity Tolerance Patient tolerated treatment well    Behavior During Therapy WFL for tasks assessed/performed             Past Medical History:  Diagnosis Date   Anxiety    Asthma    as a child   Depression    ED (erectile dysfunction)    GERD (gastroesophageal reflux disease)    Hx of spinal cord injury 03/2020   Hypertension    Neurogenic bladder    self caths   Neurogenic bowel    has to be digitially stimulated - 04/24/21   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[place])    bilateral legs   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[place])    Past Surgical History:  Procedure Laterality Date   Anorectal biospy  02/09/2021   Colon Biospy  02/09/2021   IR ANGIO INTRA EXTRACRAN SEL COM CAROTID INNOMINATE BILAT MOD SED  04/25/2021   IR ANGIO VERTEBRAL SEL SUBCLAVIAN INNOMINATE UNI L MOD SED  04/25/2021   IR ANGIO VERTEBRAL SEL VERTEBRAL UNI R MOD SED  04/25/2021   IR ANGIO/SPINAL LEFT  04/25/2021   IR ANGIO/SPINAL LEFT  04/25/2021   IR ANGIO/SPINAL LEFT  04/25/2021   IR ANGIO/SPINAL LEFT  04/25/2021   IR ANGIO/SPINAL LEFT  04/25/2021   IR ANGIO/SPINAL LEFT  04/25/2021   IR ANGIO/SPINAL LEFT  04/25/2021   IR ANGIO/SPINAL LEFT  04/25/2021   IR ANGIO/SPINAL LEFT  04/25/2021   IR ANGIO/SPINAL LEFT  04/25/2021   IR ANGIO/SPINAL LEFT  04/25/2021   IR ANGIO/SPINAL LEFT  04/25/2021   IR ANGIO/SPINAL LEFT  04/25/2021   IR ANGIO/SPINAL RIGHT  04/25/2021   IR ANGIO/SPINAL RIGHT  04/25/2021   IR ANGIO/SPINAL RIGHT  04/25/2021   IR  ANGIO/SPINAL RIGHT  04/25/2021   IR ANGIO/SPINAL RIGHT  04/25/2021   IR ANGIO/SPINAL RIGHT  04/25/2021   IR ANGIO/SPINAL RIGHT  04/25/2021   IR ANGIO/SPINAL RIGHT  04/25/2021   IR ANGIO/SPINAL RIGHT  04/25/2021   IR ANGIO/SPINAL RIGHT  04/25/2021   IR ANGIO/SPINAL RIGHT  04/25/2021   IR ANGIO/SPINAL RIGHT  04/25/2021   IR ANGIO/SPINAL RIGHT  04/25/2021   IR ANGIO/SPINAL RIGHT  04/25/2021   IR ANGIOGRAM EXTREMITY BILATERAL  04/25/2021   IR RADIOLOGIST EVAL & MGMT  03/21/2021   RADIOLOGY WITH ANESTHESIA N/A 04/25/2021   Procedure: IR WITH ANESTHESIA SPINAL ANGIOGRAM;  Surgeon: Luanne Bras, MD;  Location: [facility] OR;  Service: Radiology;  Laterality: N/A;   Patient Active Problem List   Diagnosis Date Noted   Disease of spinal cord ([place]) 05/21/2021   Anti-RNP antibodies present 05/21/2021   Weakness of both lower extremities 02/05/2021   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   Neurogenic bladder 12/08/2020   Neurogenic bowel 12/08/2020   Paraplegia following spinal cord injury ([place]) 12/08/2020   Wheelchair dependence 12/08/2020   Spasticity 12/08/2020   Chronic bilateral thoracic back pain 12/08/2020  ONSET DATE: 02/04/22 (date of referral)  REFERRING DIAG: G37.3 (ICD-10-CM) - 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[place]) G82.20 (ICD-10-CM) - Paraplegia following spinal cord injury ([place])   THERAPY DIAG:  Other abnormalities of gait and mobility  Muscle weakness (generalized)  Unsteadiness on feet  Abnormal posture  SUBJECTIVE:                                                                                                                                                                                              SUBJECTIVE STATEMENT: Pt denies falls or other acute issues.  He states he saw a version of hand controls that he might be interested in for his vehicle if his MD thinks that's still a good option in the future.  Pt accompanied by: family member and self  - dad and mom are present today outside  in parking lot  PERTINENT HISTORY: Transverse myelitis, neurogenic bowel and bladder  PAIN:  PAIN:  Are you having pain? Yes: NPRS scale: 1.5/10 Pain location: low back Pain description: sore, achy Aggravating factors: walking Relieving factors: nothing   PRECAUTIONS: None  WEIGHT BEARING RESTRICTIONS No  FALLS: Has patient fallen in last 6 months? No  LIVING ENVIRONMENT: Lives with:  mother and father Lives in: House/apartment Stairs: Yes: Internal: 12 steps; on right going up and on left going up and External: 1 steps; none, pt has chair lift at home for stairs. Has following equipment at home: Gilford Rile - 2 wheeled, Wheelchair (manual), and bed side commode  PLOF:  Independent with dressing, self care, transfer, parents help with cooking, cleaning.  PATIENT GOALS Improve strength in R leg, improve walking  OBJECTIVE:   MMT Right EAVL Left EVAL  Hip flexion 1+ 3+  Hip extension    Hip abduction 1+ 3+  Hip adduction 1+ 3+  Hip internal rotation    Hip external rotation    Knee flexion 1+ 4  Knee extension 1+ 4  Ankle dorsiflexion 1+ 3+  Ankle plantarflexion 1+ 3+  Ankle inversion    Ankle eversion    (Blank rows = not tested)  Pt uses manual wheelchair when he goes to MD appts or stores. Inside the house he uses walker for household mobility.  FUNCTIONAL TESTs:  5 times sit to stand: 47 sec Timed up and go (TUG): 1 min with RW 10 meter walk test: 0.27 m/s with RW  TODAY'S TREATMENT:  Weighed pt today (per pt request-in standing no UE support): Today's Vitals   05/15/22 1517  Weight: 176 lb (79.8 kg)   Body mass index is 25.25 kg/m. -Bil Leg Press 2x10 x80# > RLE 2x10  x30# > LLE 1x10 x40#, 1x10 x50# Pt sits EOM supervision level on green dynadisc for core engagement: -scap squeezes x15 Added airex under bil LE for inc stability challenge: -Shoulder abd x15 w/ 3# dumbbells, cued to prevent shoulder hike and inc challenge in beginning ROM (dropping weight  to sides of LE) -Shoulder flexion x15 w/ 3# dumbbells -Seated row w/ red theraband x12 -3.3lb ball reach to alt L and R cones x10  PATIENT EDUCATION: Education details: Continue with HEP modifications.  Walk w/ supervision frequently.  Discussed progressing to community walking with family slowly and limitations of RLE and fatigue.  Provided edu on returning to using recumbent bike and leg press with supervision at gym and obtaining membership to promote continued gains and maintain strength gains. Person educated: Patient Education method: Explanation Education comprehension: verbalized understanding   HOME EXERCISE PROGRAM: Access Code: [email or web address] URL: [email or web address] Date: 05/13/2022 Prepared by: Elease Etienne  Exercises - Sit to Stand with Resistance Around Legs  - 1 x daily - 5 x weekly - 2 sets - 8 reps - Standing Single Leg Stance with Counter Support  - 1 x daily - 5 x weekly - 1 sets - 5 reps - work to 30 seconds hold - Side-lying Knee Flexion  - 1 x daily - 4-5 x weekly - 2-3 sets - 3-5 reps - Walking with Counter Support  - 1 x daily - 7 x weekly - 4-5 sets - 10 reps - Tandem Stance on Foam Pad with Eyes Open  - 1 x daily - 5 x weekly - 1 sets - 3-4 reps - 30 seconds hold - Tandem Stance on Foam Pad with Eyes Closed  - 1 x daily - 5 x weekly - 1 sets - 3-4 reps - 30 seconds hold - Side Stepping with Resistance at Ankles and Counter Support  - 1 x daily - 7 x weekly - 3 sets - 10 reps  Added on 03/01/2022  Pt educated on trying L sidelying with hooklying position: R UE providing  AA hip flexion to R hip and then pt pushes leg into extension with UE resisting it. Pt educated to perform alternating AA flexion and resisted extension for 3 x 10 daily for HEP  Goals reviewed with patient? Yes UPDATED GOALS: STGs=LONG TERM GOALS: Target date: 05/03/2022 (assess and update every 30 days)   Patient will demo gait speed with LRAD of at least 0.86m/s to improve his  functional independence. Baseline: 0.2m/s with RW (02/08/22); 0.55 m/sec w/ RW (04/03/2022); 0.46 m/sec w/ RW and right posterior [place] AFO (05/06/2022) Goal status: ONGOING  2.  Pt will demo TUG score of <20 sec with LRAD to improve functional mobility. Baseline: 1 min (02/08/22); 22.56 sec w/ RW (04/03/2022); 24.13 sec w/ RW (05/06/2022) Goal status: ONGOING  3.  Patient will be able to perform 5x sit to stand with use of bil UE support in <20 seconds to improve functional LE strength and power. Baseline: 47 seconds (01/29/22); 27 sec w/ BUE support (04/03/2022); 26.57 sec (05/06/2022) Goal status: ONGOING  4.  Pt will ambulate 500' w/ LRAD and right posterior [place] AFO at supervision level to improve functional mobility to limited community distances.  Baseline:  72' w/ RW SBA/CGA; 432' w/ RW and right posterior [place] AFO supervision (05/06/2022)  Goal status:  REVISED  5.  Stair training will be initiated with caregiver training for guarding safety to improve safe independence with access to home environment.  Baseline:  To be  initiated next visit.  Goal status:  ONGOING  ASSESSMENT:  CLINICAL IMPRESSION: Focus of skilled session on addressing shoulder and periscapular stability to promote confidence and safety with LRAD as well as transfers.  Continued progressing LE and cored strength using leg press and dynadisc.  Pt is making good progress towards LTGs and is still awaiting personal AFO.   OBJECTIVE IMPAIRMENTS Abnormal gait, decreased activity tolerance, decreased balance, decreased endurance, decreased mobility, difficulty walking, decreased ROM, decreased strength, hypomobility, increased fascial restrictions, impaired flexibility, impaired sensation, impaired tone, improper body mechanics, postural dysfunction, and pain.   ACTIVITY LIMITATIONS cleaning, community activity, driving, meal prep, laundry, and yard work.   PERSONAL FACTORS Age, Past/current experiences, and Time since  onset of injury/illness/exacerbation are also affecting patient's functional outcome.    REHAB POTENTIAL: Good  CLINICAL DECISION MAKING: Stable/uncomplicated  EVALUATION COMPLEXITY: Low  PLAN: PT FREQUENCY: 2x/week  PT DURATION: other: 5 weeks  (re-cert date to accommodate pt out-of-town for 2 weeks and potential delay in scheduling)  PLANNED INTERVENTIONS: Therapeutic exercises, Therapeutic activity, Neuromuscular re-education, Balance training, Gait training, Patient/Family education, Joint mobilization, Stair training, Orthotic/Fit training, Aquatic Therapy, Spinal mobilization, Cryotherapy, Moist heat, and Manual therapy  PLAN FOR NEXT SESSION:  UE/Core/upper trunk therex.  Review/modify HEP prn.  Single leg on leg press.  SciFit.  Treadmill training-harness?  Continue to work with loft strand crutches and work on standing balance as needed.  Progress gait w/o AD vs cane-use of R posterior [place].  Gait training w/ LRAD over unlevel surfaces.  Continue stair training/curb training w/ RW vs loftstrands  Bary Richard, PT, DPT 05/15/2022, 3:45 PM

## 2022-05-20 ENCOUNTER — Ambulatory Visit: Payer: BC Managed Care – PPO | Admitting: Physical Therapy

## 2022-05-20 ENCOUNTER — Encounter: Payer: Self-pay | Admitting: Physical Therapy

## 2022-05-20 DIAGNOSIS — R293 Abnormal posture: Secondary | ICD-10-CM

## 2022-05-20 DIAGNOSIS — R2681 Unsteadiness on feet: Secondary | ICD-10-CM

## 2022-05-20 DIAGNOSIS — R2689 Other abnormalities of gait and mobility: Secondary | ICD-10-CM | POA: Diagnosis not present

## 2022-05-20 DIAGNOSIS — M6281 Muscle weakness (generalized): Secondary | ICD-10-CM

## 2022-05-20 NOTE — Therapy (Signed)
OUTPATIENT PHYSICAL THERAPY TREATMENT NOTE   Patient Name: Maurice VANDERHOOF MRN: [id number] DOB:October 19, 1965, 56 y.o., male Today's Date: 05/20/2022  PCP: Dr. Glendon Axe REFERRING PROVIDER: Courtney Heys, MD    PT End of Session - 05/20/22 1451     Visit Number 21    Number of Visits 27    Date for PT Re-Evaluation 05/31/22    Authorization Type BCBS (60 visit limit annually)    PT Start Time 22    PT Stop Time 1529    PT Time Calculation (min) 40 min    Equipment Utilized During Treatment Gait belt    Activity Tolerance Patient tolerated treatment well    Behavior During Therapy WFL for tasks assessed/performed             Past Medical History:  Diagnosis Date   Anxiety    Asthma    as a child   Depression    ED (erectile dysfunction)    GERD (gastroesophageal reflux disease)    Hx of spinal cord injury 03/2020   Hypertension    Neurogenic bladder    self caths   Neurogenic bowel    has to be digitially stimulated - 04/24/21   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[place])    bilateral legs   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[place])    Past Surgical History:  Procedure Laterality Date   Anorectal biospy  02/09/2021   Colon Biospy  02/09/2021   IR ANGIO INTRA EXTRACRAN SEL COM CAROTID INNOMINATE BILAT MOD SED  04/25/2021   IR ANGIO VERTEBRAL SEL SUBCLAVIAN INNOMINATE UNI L MOD SED  04/25/2021   IR ANGIO VERTEBRAL SEL VERTEBRAL UNI R MOD SED  04/25/2021   IR ANGIO/SPINAL LEFT  04/25/2021   IR ANGIO/SPINAL LEFT  04/25/2021   IR ANGIO/SPINAL LEFT  04/25/2021   IR ANGIO/SPINAL LEFT  04/25/2021   IR ANGIO/SPINAL LEFT  04/25/2021   IR ANGIO/SPINAL LEFT  04/25/2021   IR ANGIO/SPINAL LEFT  04/25/2021   IR ANGIO/SPINAL LEFT  04/25/2021   IR ANGIO/SPINAL LEFT  04/25/2021   IR ANGIO/SPINAL LEFT  04/25/2021   IR ANGIO/SPINAL LEFT  04/25/2021   IR ANGIO/SPINAL LEFT  04/25/2021   IR ANGIO/SPINAL LEFT  04/25/2021   IR ANGIO/SPINAL RIGHT  04/25/2021   IR ANGIO/SPINAL RIGHT  04/25/2021   IR ANGIO/SPINAL RIGHT  04/25/2021   IR  ANGIO/SPINAL RIGHT  04/25/2021   IR ANGIO/SPINAL RIGHT  04/25/2021   IR ANGIO/SPINAL RIGHT  04/25/2021   IR ANGIO/SPINAL RIGHT  04/25/2021   IR ANGIO/SPINAL RIGHT  04/25/2021   IR ANGIO/SPINAL RIGHT  04/25/2021   IR ANGIO/SPINAL RIGHT  04/25/2021   IR ANGIO/SPINAL RIGHT  04/25/2021   IR ANGIO/SPINAL RIGHT  04/25/2021   IR ANGIO/SPINAL RIGHT  04/25/2021   IR ANGIO/SPINAL RIGHT  04/25/2021   IR ANGIOGRAM EXTREMITY BILATERAL  04/25/2021   IR RADIOLOGIST EVAL & MGMT  03/21/2021   RADIOLOGY WITH ANESTHESIA N/A 04/25/2021   Procedure: IR WITH ANESTHESIA SPINAL ANGIOGRAM;  Surgeon: Luanne Bras, MD;  Location: [facility] OR;  Service: Radiology;  Laterality: N/A;   Patient Active Problem List   Diagnosis Date Noted   Disease of spinal cord ([place]) 05/21/2021   Anti-RNP antibodies present 05/21/2021   Weakness of both lower extremities 02/05/2021   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   Neurogenic bladder 12/08/2020   Neurogenic bowel 12/08/2020   Paraplegia following spinal cord injury ([place]) 12/08/2020   Wheelchair dependence 12/08/2020   Spasticity 12/08/2020   Chronic bilateral thoracic back pain 12/08/2020  ONSET DATE: 02/04/22 (date of referral)  REFERRING DIAG: G37.3 (ICD-10-CM) - 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[place]) G82.20 (ICD-10-CM) - Paraplegia following spinal cord injury ([place])   THERAPY DIAG:  Other abnormalities of gait and mobility  Muscle weakness (generalized)  Unsteadiness on feet  Abnormal posture  SUBJECTIVE:                                                                                                                                                                                              SUBJECTIVE STATEMENT: Pt denies recent falls or other acute changes.  He states his AFO has not been delivered yet so he will call [name] to see if they need to reschedule his fitting appt to trim the AFO if needed.  Pt accompanied by: family member and self  - dad  PERTINENT HISTORY:  Transverse myelitis, neurogenic bowel and bladder  PAIN:  PAIN:  Are you having pain? Yes: NPRS scale: 2/10 Pain location: low back Pain description: sore, achy Aggravating factors: walking Relieving factors: nothing  PRECAUTIONS: None  WEIGHT BEARING RESTRICTIONS No  FALLS: Has patient fallen in last 6 months? No  LIVING ENVIRONMENT: Lives with:  mother and father Lives in: House/apartment Stairs: Yes: Internal: 12 steps; on right going up and on left going up and External: 1 steps; none, pt has chair lift at home for stairs. Has following equipment at home: Gilford Rile - 2 wheeled, Wheelchair (manual), and bed side commode  PLOF:  Independent with dressing, self care, transfer, parents help with cooking, cleaning.  PATIENT GOALS Improve strength in R leg, improve walking  OBJECTIVE:   MMT Right EAVL Left EVAL  Hip flexion 1+ 3+  Hip extension    Hip abduction 1+ 3+  Hip adduction 1+ 3+  Hip internal rotation    Hip external rotation    Knee flexion 1+ 4  Knee extension 1+ 4  Ankle dorsiflexion 1+ 3+  Ankle plantarflexion 1+ 3+  Ankle inversion    Ankle eversion    (Blank rows = not tested)  Pt uses manual wheelchair when he goes to MD appts or stores. Inside the house he uses walker for household mobility.  FUNCTIONAL TESTs:  5 times sit to stand: 47 sec Timed up and go (TUG): 1 min with RW 10 meter walk test: 0.27 m/s with RW  TODAY'S TREATMENT:  Time spent scheduling 2 additional appts to allow time for pt to receive AFO and complete fitting with gait training as needed.    GAIT: Gait pattern: step through pattern, decreased stride length, and poor foot clearance- Right Distance walked: 230' Assistive device utilized: Crutches-loftstrand; R  posterior Ott[place]ock AFO Level of assistance: Modified independence and SBA Comments: Single instance of SBA due to left LOB with misplaced crutch.  Pt self-corrects without issue.  RLE clearance becomes difficult w/ inc  distance.  Sitting on physioball pt performs alt LE lift and hold x5sec x30 w/ inc posterior lean, multimodal cues to improve upright midline w/ difficulty maintaining correction BLE on floor performing alt hip weight shifts L & R > 4-way 3.3 lb wt ball lifts x10 > paddling w/ wt alt UE x12 each side to incorporate weight shift  PATIENT EDUCATION: Education details: Continue with HEP modifications.  Walk w/ supervision frequently.  Purchase loftstrands if still interested and bring to session for adjustment.  Person educated: Patient Education method: Explanation Education comprehension: verbalized understanding   HOME EXERCISE PROGRAM: Access Code: [email or web address] URL: [email or web address] Date: 05/13/2022 Prepared by: Elease Etienne  Exercises - Sit to Stand with Resistance Around Legs  - 1 x daily - 5 x weekly - 2 sets - 8 reps - Standing Single Leg Stance with Counter Support  - 1 x daily - 5 x weekly - 1 sets - 5 reps - work to 30 seconds hold - Side-lying Knee Flexion  - 1 x daily - 4-5 x weekly - 2-3 sets - 3-5 reps - Walking with Counter Support  - 1 x daily - 7 x weekly - 4-5 sets - 10 reps - Tandem Stance on Foam Pad with Eyes Open  - 1 x daily - 5 x weekly - 1 sets - 3-4 reps - 30 seconds hold - Tandem Stance on Foam Pad with Eyes Closed  - 1 x daily - 5 x weekly - 1 sets - 3-4 reps - 30 seconds hold - Side Stepping with Resistance at Ankles and Counter Support  - 1 x daily - 7 x weekly - 3 sets - 10 reps  Added on 03/01/2022  Pt educated on trying L sidelying with hooklying position: R UE providing  AA hip flexion to R hip and then pt pushes leg into extension with UE resisting it. Pt educated to perform alternating AA flexion and resisted extension for 3 x 10 daily for HEP  Goals reviewed with patient? Yes UPDATED GOALS: STGs=LONG TERM GOALS: Target date: 05/31/2022 (assess and update every 30 days)   Patient will demo gait speed with LRAD of at least 0.70m/s to  improve his functional independence. Baseline: 0.62m/s with RW (02/08/22); 0.55 m/sec w/ RW (04/03/2022); 0.46 m/sec w/ RW and right posterior [place] AFO (05/06/2022) Goal status: ONGOING  2.  Pt will demo TUG score of <20 sec with LRAD to improve functional mobility. Baseline: 1 min (02/08/22); 22.56 sec w/ RW (04/03/2022); 24.13 sec w/ RW (05/06/2022) Goal status: ONGOING  3.  Patient will be able to perform 5x sit to stand with use of bil UE support in <20 seconds to improve functional LE strength and power. Baseline: 47 seconds (01/29/22); 27 sec w/ BUE support (04/03/2022); 26.57 sec (05/06/2022) Goal status: ONGOING  4.  Pt will ambulate 500' w/ LRAD and right posterior [place] AFO at supervision level to improve functional mobility to limited community distances.  Baseline:  69' w/ RW SBA/CGA; 432' w/ RW and right posterior [place] AFO supervision (05/06/2022)  Goal status:  REVISED  5.  Stair training will be initiated with caregiver training for guarding safety to improve safe independence with access to home environment.  Baseline:  To be initiated next visit.  Goal status:  ONGOING  ASSESSMENT:  CLINICAL IMPRESSION: Pt does well with progression of core and UE strengthening this session with some noted core instability.  He continues to progress his independence with use of loftstrand crutches with discussion of purchasing own set and bringing to future session to adjust.  He continues to benefit from skilled PT POC to address gait independence and core instability.   OBJECTIVE IMPAIRMENTS Abnormal gait, decreased activity tolerance, decreased balance, decreased endurance, decreased mobility, difficulty walking, decreased ROM, decreased strength, hypomobility, increased fascial restrictions, impaired flexibility, impaired sensation, impaired tone, improper body mechanics, postural dysfunction, and pain.   ACTIVITY LIMITATIONS cleaning, community activity, driving, meal prep, laundry, and  yard work.   PERSONAL FACTORS Age, Past/current experiences, and Time since onset of injury/illness/exacerbation are also affecting patient's functional outcome.    REHAB POTENTIAL: Good  CLINICAL DECISION MAKING: Stable/uncomplicated  EVALUATION COMPLEXITY: Low  PLAN: PT FREQUENCY: 2x/week  PT DURATION: other: 5 weeks  (re-cert date to accommodate pt out-of-town for 2 weeks and potential delay in scheduling)  PLANNED INTERVENTIONS: Therapeutic exercises, Therapeutic activity, Neuromuscular re-education, Balance training, Gait training, Patient/Family education, Joint mobilization, Stair training, Orthotic/Fit training, Aquatic Therapy, Spinal mobilization, Cryotherapy, Moist heat, and Manual therapy  PLAN FOR NEXT SESSION:  UE/Core/upper trunk therex.  Review/modify HEP prn.  Single leg on leg press.  SciFit.  Treadmill training-harness?  Continue to work with loft strand crutches and work on standing balance as needed.  Progress gait w/o AD vs cane-use of R posterior [place].  Gait training w/ LRAD over unlevel surfaces.  Continue stair training/curb training w/ RW vs loftstrands  Bary Richard, PT, DPT 05/20/2022, 3:38 PM

## 2022-05-22 ENCOUNTER — Ambulatory Visit: Payer: BC Managed Care – PPO | Admitting: Physical Therapy

## 2022-05-22 ENCOUNTER — Encounter: Payer: Self-pay | Admitting: Physical Therapy

## 2022-05-22 DIAGNOSIS — R2681 Unsteadiness on feet: Secondary | ICD-10-CM

## 2022-05-22 DIAGNOSIS — R2689 Other abnormalities of gait and mobility: Secondary | ICD-10-CM

## 2022-05-22 DIAGNOSIS — R293 Abnormal posture: Secondary | ICD-10-CM

## 2022-05-22 DIAGNOSIS — M6281 Muscle weakness (generalized): Secondary | ICD-10-CM

## 2022-05-22 NOTE — Therapy (Addendum)
OUTPATIENT PHYSICAL THERAPY TREATMENT NOTE   Patient Name: Maurice Cruz MRN: [id number] DOB:1966-01-17, 56 y.o., male Today's Date: 05/22/2022  PCP: Dr. Glendon Axe REFERRING PROVIDER: Courtney Heys, MD    PT End of Session - 05/22/22 1455     Visit Number 22    Number of Visits 27    Date for PT Re-Evaluation 05/31/22    Authorization Type [employer] (60 visit limit annually)    PT Start Time 70   PT ran over with pt prior   PT Stop Time 27    PT Time Calculation (min) 38 min    Equipment Utilized During Treatment Gait belt    Activity Tolerance Patient tolerated treatment well    Behavior During Therapy WFL for tasks assessed/performed             Past Medical History:  Diagnosis Date   Anxiety    Asthma    as a child   Depression    ED (erectile dysfunction)    GERD (gastroesophageal reflux disease)    Hx of spinal cord injury 03/2020   Hypertension    Neurogenic bladder    self caths   Neurogenic bowel    has to be digitially stimulated - 04/24/21   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[place])    bilateral legs   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[place])    Past Surgical History:  Procedure Laterality Date   Anorectal biospy  02/09/2021   Colon Biospy  02/09/2021   IR ANGIO INTRA EXTRACRAN SEL COM CAROTID INNOMINATE BILAT MOD SED  04/25/2021   IR ANGIO VERTEBRAL SEL SUBCLAVIAN INNOMINATE UNI L MOD SED  04/25/2021   IR ANGIO VERTEBRAL SEL VERTEBRAL UNI R MOD SED  04/25/2021   IR ANGIO/SPINAL LEFT  04/25/2021   IR ANGIO/SPINAL LEFT  04/25/2021   IR ANGIO/SPINAL LEFT  04/25/2021   IR ANGIO/SPINAL LEFT  04/25/2021   IR ANGIO/SPINAL LEFT  04/25/2021   IR ANGIO/SPINAL LEFT  04/25/2021   IR ANGIO/SPINAL LEFT  04/25/2021   IR ANGIO/SPINAL LEFT  04/25/2021   IR ANGIO/SPINAL LEFT  04/25/2021   IR ANGIO/SPINAL LEFT  04/25/2021   IR ANGIO/SPINAL LEFT  04/25/2021   IR ANGIO/SPINAL LEFT  04/25/2021   IR ANGIO/SPINAL LEFT  04/25/2021   IR ANGIO/SPINAL RIGHT  04/25/2021   IR ANGIO/SPINAL RIGHT  04/25/2021   IR  ANGIO/SPINAL RIGHT  04/25/2021   IR ANGIO/SPINAL RIGHT  04/25/2021   IR ANGIO/SPINAL RIGHT  04/25/2021   IR ANGIO/SPINAL RIGHT  04/25/2021   IR ANGIO/SPINAL RIGHT  04/25/2021   IR ANGIO/SPINAL RIGHT  04/25/2021   IR ANGIO/SPINAL RIGHT  04/25/2021   IR ANGIO/SPINAL RIGHT  04/25/2021   IR ANGIO/SPINAL RIGHT  04/25/2021   IR ANGIO/SPINAL RIGHT  04/25/2021   IR ANGIO/SPINAL RIGHT  04/25/2021   IR ANGIO/SPINAL RIGHT  04/25/2021   IR ANGIOGRAM EXTREMITY BILATERAL  04/25/2021   IR RADIOLOGIST EVAL & MGMT  03/21/2021   RADIOLOGY WITH ANESTHESIA N/A 04/25/2021   Procedure: IR WITH ANESTHESIA SPINAL ANGIOGRAM;  Surgeon: Luanne Bras, MD;  Location: [facility];  Service: Radiology;  Laterality: N/A;   Patient Active Problem List   Diagnosis Date Noted   Disease of spinal cord ([place]) 05/21/2021   Anti-RNP antibodies present 05/21/2021   Weakness of both lower extremities 02/05/2021   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   Neurogenic bladder 12/08/2020   Neurogenic bowel 12/08/2020   Paraplegia following spinal cord injury ([place]) 12/08/2020   Wheelchair dependence 12/08/2020   Spasticity 12/08/2020   Chronic bilateral  thoracic back pain 12/08/2020    ONSET DATE: 02/04/22 (date of referral)  REFERRING DIAG: G37.3 (ICD-10-CM) - 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[place]) G82.20 (ICD-10-CM) - Paraplegia following spinal cord injury ([place])   THERAPY DIAG:  Other abnormalities of gait and mobility  Muscle weakness (generalized)  Unsteadiness on feet  Abnormal posture  SUBJECTIVE:                                                                                                                                                                                              SUBJECTIVE STATEMENT: Pt denies recent falls or other acute changes.  He states his AFO has not been delivered yet so he will call [name] to reschedule appt for Friday.  Pt accompanied by: family member and self  - dad  PERTINENT HISTORY: Transverse  myelitis, neurogenic bowel and bladder  PAIN:  PAIN:  Pt endorses same pain as prior. Are you having pain? Yes: NPRS scale: 2/10 Pain location: low back Pain description: sore, achy Aggravating factors: walking Relieving factors: nothing  PRECAUTIONS: None  WEIGHT BEARING RESTRICTIONS No  FALLS: Has patient fallen in last 6 months? No  LIVING ENVIRONMENT: Lives with:  mother and father Lives in: House/apartment Stairs: Yes: Internal: 12 steps; on right going up and on left going up and External: 1 steps; none, pt has chair lift at home for stairs. Has following equipment at home: Gilford Rile - 2 wheeled, Wheelchair (manual), and bed side commode  PLOF:  Independent with dressing, self care, transfer, parents help with cooking, cleaning.  PATIENT GOALS Improve strength in R leg, improve walking  OBJECTIVE:   MMT Right EAVL Left EVAL  Hip flexion 1+ 3+  Hip extension    Hip abduction 1+ 3+  Hip adduction 1+ 3+  Hip internal rotation    Hip external rotation    Knee flexion 1+ 4  Knee extension 1+ 4  Ankle dorsiflexion 1+ 3+  Ankle plantarflexion 1+ 3+  Ankle inversion    Ankle eversion    (Blank rows = not tested)  Pt uses manual wheelchair when he goes to MD appts or stores. Inside the house he uses walker for household mobility.  FUNCTIONAL TESTs:  5 times sit to stand: 47 sec Timed up and go (TUG): 1 min with RW 10 meter walk test: 0.27 m/s with RW  TODAY'S TREATMENT:  -STS from 24.5" surface w/o UE support x8 w/ good ecc lower -STS from 19.5" surface (contoured cushion of w/c) x10, pt demonstrates improved push and release into standing relying more on glut strength to complete tall posture, immediate standing balance is steady, he attempts  ecc lower w/ good control until approx. 120 degs of knee flexion requiring BUE support  STAIRS:  Level of Assistance:  distant supervision  Stair Negotiation Technique: Step to Pattern Forwards With use of AD: R posterior  [place] AFO  with Single Rail on Left  Number of Stairs: 8   Height of Stairs: 6"  Comments: Simulated home environment with dad supervising, communicated to dad cues provided to patient to ensure step clearance and proper placement of RLE on descent.  Pt is not able to face rail and ascend or descend stairs laterally due to left rail and right LE weakness.  Discussed difficulty of doing butt bump vs using 2 hands on left rail facing feet forward so LLE can ascend first.  Pt in agreement as goal is to be able to go up stairs standing even if requires rest due to potential loss of chair lift use if power goes out.  -Leg press bilateral #70 2x15, #80 x6 w/ inc fatigue and difficulty controlling left knee into extension -NuStep x3.5 minutes using BLE only for neural priming, endurance, and LE strengthening on L5 w/ step goal of 45, achieved avg 40.  PATIENT EDUCATION: Education details: Re-iterated:  Continue with HEP modifications.  Walk w/ supervision frequently.  Purchase loftstrands if still interested and bring to session for adjustment.  Person educated: Patient Education method: Explanation Education comprehension: verbalized understanding   HOME EXERCISE PROGRAM: Access Code: [email or web address] URL: [email or web address] Date: 05/13/2022 Prepared by: Elease Etienne  Exercises - Sit to Stand with Resistance Around Legs  - 1 x daily - 5 x weekly - 2 sets - 8 reps - Standing Single Leg Stance with Counter Support  - 1 x daily - 5 x weekly - 1 sets - 5 reps - work to 30 seconds hold - Side-lying Knee Flexion  - 1 x daily - 4-5 x weekly - 2-3 sets - 3-5 reps - Walking with Counter Support  - 1 x daily - 7 x weekly - 4-5 sets - 10 reps - Tandem Stance on Foam Pad with Eyes Open  - 1 x daily - 5 x weekly - 1 sets - 3-4 reps - 30 seconds hold - Tandem Stance on Foam Pad with Eyes Closed  - 1 x daily - 5 x weekly - 1 sets - 3-4 reps - 30 seconds hold - Side Stepping with Resistance at  Ankles and Counter Support  - 1 x daily - 7 x weekly - 3 sets - 10 reps  Added on 03/01/2022  Pt educated on trying L sidelying with hooklying position: R UE providing  AA hip flexion to R hip and then pt pushes leg into extension with UE resisting it. Pt educated to perform alternating AA flexion and resisted extension for 3 x 10 daily for HEP  Goals reviewed with patient? Yes UPDATED GOALS: STGs=LONG TERM GOALS: Target date: 05/31/2022 (assess and update every 30 days)   Patient will demo gait speed with LRAD of at least 0.31m/s to improve his functional independence. Baseline: 0.38m/s with RW (02/08/22); 0.55 m/sec w/ RW (04/03/2022); 0.46 m/sec w/ RW and right posterior [place] AFO (05/06/2022) Goal status: ONGOING  2.  Pt will demo TUG score of <20 sec with LRAD to improve functional mobility. Baseline: 1 min (02/08/22); 22.56 sec w/ RW (04/03/2022); 24.13 sec w/ RW (05/06/2022) Goal status: ONGOING  3.  Patient will be able to perform 5x sit to stand with use of bil UE support in <20 seconds to  improve functional LE strength and power. Baseline: 47 seconds (01/29/22); 27 sec w/ BUE support (04/03/2022); 26.57 sec (05/06/2022) Goal status: ONGOING  4.  Pt will ambulate 500' w/ LRAD and right posterior [place] AFO at supervision level to improve functional mobility to limited community distances.  Baseline:  43' w/ RW SBA/CGA; 432' w/ RW and right posterior [place] AFO supervision (05/06/2022)  Goal status:  REVISED  5.  Stair training will be initiated with caregiver training for guarding safety to improve safe independence with access to home environment.  Baseline:  To be initiated next visit.  Goal status:  ONGOING  ASSESSMENT:  CLINICAL IMPRESSION: Pt is progressing well towards his LTGs demonstrating steady maintenance of weight tolerated on BLE in standing and seated positions.  He is continuing to do well with stair training and upon discussing safe supervision with father this session was  cleared to trial stairs at home using left rail setup and standing rest breaks as need to manage LE fatigue.  Will continue to address deficits in remaining sessions as outlined in ongoing PT POC.   OBJECTIVE IMPAIRMENTS Abnormal gait, decreased activity tolerance, decreased balance, decreased endurance, decreased mobility, difficulty walking, decreased ROM, decreased strength, hypomobility, increased fascial restrictions, impaired flexibility, impaired sensation, impaired tone, improper body mechanics, postural dysfunction, and pain.   ACTIVITY LIMITATIONS cleaning, community activity, driving, meal prep, laundry, and yard work.   PERSONAL FACTORS Age, Past/current experiences, and Time since onset of injury/illness/exacerbation are also affecting patient's functional outcome.    REHAB POTENTIAL: Good  CLINICAL DECISION MAKING: Stable/uncomplicated  EVALUATION COMPLEXITY: Low  PLAN: PT FREQUENCY: 2x/week  PT DURATION: other: 5 weeks  (re-cert date to accommodate pt out-of-town for 2 weeks and potential delay in scheduling)  PLANNED INTERVENTIONS: Therapeutic exercises, Therapeutic activity, Neuromuscular re-education, Balance training, Gait training, Patient/Family education, Joint mobilization, Stair training, Orthotic/Fit training, Aquatic Therapy, Spinal mobilization, Cryotherapy, Moist heat, and Manual therapy  PLAN FOR NEXT SESSION:   Review/modify HEP.  Single leg on leg press.  SciFit.  Gait training w/ LRAD over unlevel surfaces.  Continue stair training/curb training w/ RW vs loftstrands  Bary Richard, PT, DPT 05/22/2022, 3:41 PM

## 2022-05-23 ENCOUNTER — Ambulatory Visit (INDEPENDENT_AMBULATORY_CARE_PROVIDER_SITE_OTHER): Payer: BC Managed Care – PPO | Admitting: Neurology

## 2022-05-23 ENCOUNTER — Encounter: Payer: Self-pay | Admitting: Neurology

## 2022-05-23 VITALS — BP 142/80 | HR 63 | Ht 70.0 in | Wt 177.5 lb

## 2022-05-23 DIAGNOSIS — R29898 Other symptoms and signs involving the musculoskeletal system: Secondary | ICD-10-CM

## 2022-05-23 DIAGNOSIS — K592 Neurogenic bowel, not elsewhere classified: Secondary | ICD-10-CM

## 2022-05-23 DIAGNOSIS — G9519 Other vascular myelopathies: Secondary | ICD-10-CM

## 2022-05-23 DIAGNOSIS — N319 Neuromuscular dysfunction of bladder, unspecified: Secondary | ICD-10-CM | POA: Diagnosis not present

## 2022-05-23 DIAGNOSIS — R252 Cramp and spasm: Secondary | ICD-10-CM

## 2022-05-23 NOTE — Progress Notes (Signed)
[facility]  PATIENT: Maurice Cruz DOB: 02-03-1966  REFERRING DOCTOR OR PCP: Chales Salmon, MD (neurology); Glendon Axe, MD (PCP) SOURCE: Patient, notes from Dr. Jannifer Franklin, imaging and laboratory reports.  _________________________________   HISTORICAL  CHIEF COMPLAINT:  Chief Complaint  Patient presents with   Follow-up    Rm 1, alone. Here for yearly f/u for spinal cord hemorrhage. Pt reports doing well today.     HISTORY OF PRESENT ILLNESS:   Maurice Cruz is a 56 y.o. man with a spinal cord syndrome.  Update 05/23/2022: He is doing better and able to use a walker around the house and short distances outside and a manual wheelchair for longer distance.    He can go at least 300 feet with his walker.    Left leg is much stronger than the left.   He denies much numbness.   For spasticity he takes tizanidine with benefit.  We had tried dalfampridine but he did not get any benefit and he stopped.  He still has bowel and bladder issues, about the same as last year.    Bladder function is poor and he needs to self catheterize.    He feels the urge but is unable to void more than just a little bit and needs to self cath x 4 times most days (occ 3 times)   FLomax had not helped.      I discussed with him that I believe that the issue with the spinal cord was a small hemorrhage.  The etiology is uncertain but the MRI does show changes most consistent with hemorrhage.  Fortunately, the spinal angiogram did not show evidence of a dural AV fistula or other vascular abnormality.  I think the edema and enhancement seen in the spinal cord was reactive to the hemorrhage not a separate inflammatory transverse myelitis.    The repeat MRI of the thoracic spine 03/14/2021 shows a small focus of hypointensity adjacent to T9 on the T2 weighted images which could potentially be hemosiderin.  It is not hyperintense as would be expected from a transverse myelitis scar.    History of the  spinal cord syndrome: He presented with low back pain followed by leg weakness and numbness in July 2021.  Symptoms developed over minutes to an hour.   By the time the paramedics arrived, he could not move his legs.   The numbness ws not loss of sensation but his legs felt different.  His local hospital could not do MRI (CT was fine) so he was sent to [facility] in [place].  He had MRIs and lumbar punctures.  He was treated with 5 days of IV Solu-Medrol and IVIG but did not have benefit.  He was then discharged to [facility] rehab and then spent a month there.  He felt he improved some.      He moved to [place] in November.  He was referred to Dr. Jannifer Franklin on 10/20/2020.  She has referred him to see me for a second opinion regarding the possibility of MS.  At the time of his initial visit with me 02/05/2021, his left leg was only mildly weak (70% strength) but the right leg is still very weak (25%).  Sensation improved but not to baseline.   He denies painful dysesthesias currently though he will sometimes experience these in the thoracic spine.Marland Kitchen   He is doing PT at [facility].   With a walker, he can walk > 100 feet without a rest.  He has spasticity and he noted mild benefit but had lightheadedness.  He has needed to self-cath since the onset of symptoms.  Bowel function is poor even with stool softeners and Miralax and lactulose.   Due to continued bowel issues, he sometimes needs Dulcolax or Mg Citrate.    He is taking tizanidine for spasticity (new) but had difficulty tolerating baclofen at the same time as escitalopram.     He notes some depression and is on Prozac (was on Lexapro).    He sleeps ok when he takes a muscle relaxant).     He was working in [employer] for [employer] but haven't been able to return due to inability to sit prolonged periods of time.     Imaging reports  MRI of the thoracic spine without contrast 04/15/2020 showed segmental hyperintensity within the spinal cord at  T6-T7 through T9 and from T9-T10 through T11.  MRI of the thoracic spine with contrast 04/16/2020 showed enhancement that corresponded with the hyperintense changes seen on the 04/15/2020 MRI.  There was also a spot of hypointense changes at T9.  MRI of the cervical spine 04/16/2020 was essentially normal.  MRI of the brain 04/16/2020 was normal.  MRI of the lumbar spine 04/15/2020 showed mild degenerative changes but the conus medullaris appears normal.  MRI of the thoracic spine 03/14/2021 shows lower thoracic spinal cord atrophy.  There is a punctate hyperintensity within the spinal cord at T9.  There was no abnormal enhancement.  There is no longer T2/flair hyperintensity.  Laboratory: CSF 04/17/2020 showed greater than 5 bands that were all observed in both CSF and serum (seen with systemic not intracerebral synthesis of gammaglobulins).  CSF protein was slightly elevated at 47.  Other laboratory test 04/15/2020 showed negative HIV, negative RPR, normal vitamin B12.  Hemoglobin A1c was 6.1.  REVIEW OF SYSTEMS: Constitutional: No fevers, chills, sweats, or change in appetite Eyes: No visual changes, double vision, eye pain Ear, nose and throat: No hearing loss, ear pain, nasal congestion, sore throat Cardiovascular: No chest pain, palpitations Respiratory:  No shortness of breath at rest or with exertion.   No wheezes GastrointestinaI: Severe constipation. Genitourinary: Urinary retention. Musculoskeletal:  No neck pain, back pain Integumentary: No rash, pruritus, skin lesions Neurological: as above Psychiatric: No depression at this time.  No anxiety Endocrine: No palpitations, diaphoresis, change in appetite, change in weigh or increased thirst Hematologic/Lymphatic:  No anemia, purpura, petechiae. Allergic/Immunologic: No itchy/runny eyes, nasal congestion, recent allergic reactions, rashes  ALLERGIES: No Known Allergies  HOME MEDICATIONS:  Current Outpatient Medications:     amLODipine (NORVASC) 5 MG tablet, Take 5 mg by mouth daily., Disp: , Rfl:    diazepam (VALIUM) 2 MG tablet, Take 1 tablet (2 mg total) by mouth in the morning and at bedtime., Disp: 60 tablet, Rfl: 5   docusate sodium (ENEMEEZ) 283 MG enema, Place 1 enema (283 mg total) rectally daily. For bowel program- for paraplegia, Disp: 30 each, Rfl: 5   FLUoxetine (PROZAC) 10 MG capsule, Take 1 capsule (10 mg total) by mouth daily. For mood, Disp: 30 capsule, Rfl: 5   labetalol (NORMODYNE) 100 MG tablet, Take 100 mg by mouth 2 (two) times daily., Disp: , Rfl:    lactulose (CHRONULAC) 10 GM/15ML solution, Take 20 g by mouth daily., Disp: , Rfl:    losartan (COZAAR) 50 MG tablet, Take 50 mg by mouth daily., Disp: , Rfl:    Magnesium 250 MG TABS, Take 400 mg by mouth daily.,  Disp: , Rfl:    magnesium hydroxide (DULCOLAX) 400 MG/5ML suspension, Take by mouth daily as needed for mild constipation., Disp: , Rfl:    Multiple Vitamins-Minerals (CENTRUM MEN PO), Take 1 tablet by mouth daily., Disp: , Rfl:    pantoprazole (PROTONIX) 40 MG tablet, Take 40 mg by mouth daily., Disp: , Rfl:    polycarbophil (FIBERCON) 625 MG tablet, Take 1,250 mg by mouth daily., Disp: , Rfl:    rosuvastatin (CRESTOR) 5 MG tablet, Take 5 mg by mouth daily., Disp: , Rfl:    senna (SENOKOT) 8.6 MG TABS tablet, Take 2 tablets by mouth daily., Disp: , Rfl:    sildenafil (VIAGRA) 100 MG tablet, Take 100 mg by mouth See admin instructions. Take 100 mg every 3 days as needed for erectile dysfunction, Disp: , Rfl:    tiZANidine (ZANAFLEX) 4 MG tablet, TAKE 1/2 TO 1 (ONE-HALF TO ONE) TABLET BY MOUTH TWICE DAILY, Disp: 60 tablet, Rfl: 5   traMADol (ULTRAM) 50 MG tablet, Take 1 tablet (50 mg total) by mouth every 6 (six) hours as needed., Disp: 75 tablet, Rfl: 5   triamterene-hydrochlorothiazide (MAXZIDE-25) 37.5-25 MG tablet, Take 1 tablet by mouth daily., Disp: , Rfl:   PAST MEDICAL HISTORY: Past Medical History:  Diagnosis Date   Anxiety     Asthma    as a child   Depression    ED (erectile dysfunction)    GERD (gastroesophageal reflux disease)    Hx of spinal cord injury 03/2020   Hypertension    Neurogenic bladder    self caths   Neurogenic bowel    has to be digitially stimulated - 04/24/21   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[place])     PAST SURGICAL HISTORY: Past Surgical History:  Procedure Laterality Date   Anorectal biospy  02/09/2021   Colon Biospy  02/09/2021   IR ANGIO INTRA EXTRACRAN SEL COM CAROTID INNOMINATE BILAT MOD SED  04/25/2021   IR ANGIO VERTEBRAL SEL SUBCLAVIAN INNOMINATE UNI L MOD SED  04/25/2021   IR ANGIO VERTEBRAL SEL VERTEBRAL UNI R MOD SED  04/25/2021   IR ANGIO/SPINAL LEFT  04/25/2021   IR ANGIO/SPINAL LEFT  04/25/2021   IR ANGIO/SPINAL LEFT  04/25/2021   IR ANGIO/SPINAL LEFT  04/25/2021   IR ANGIO/SPINAL LEFT  04/25/2021   IR ANGIO/SPINAL LEFT  04/25/2021   IR ANGIO/SPINAL LEFT  04/25/2021   IR ANGIO/SPINAL LEFT  04/25/2021   IR ANGIO/SPINAL LEFT  04/25/2021   IR ANGIO/SPINAL LEFT  04/25/2021   IR ANGIO/SPINAL LEFT  04/25/2021   IR ANGIO/SPINAL LEFT  04/25/2021   IR ANGIO/SPINAL LEFT  04/25/2021   IR ANGIO/SPINAL RIGHT  04/25/2021   IR ANGIO/SPINAL RIGHT  04/25/2021   IR ANGIO/SPINAL RIGHT  04/25/2021   IR ANGIO/SPINAL RIGHT  04/25/2021   IR ANGIO/SPINAL RIGHT  04/25/2021   IR ANGIO/SPINAL RIGHT  04/25/2021   IR ANGIO/SPINAL RIGHT  04/25/2021   IR ANGIO/SPINAL RIGHT  04/25/2021   IR ANGIO/SPINAL RIGHT  04/25/2021   IR ANGIO/SPINAL RIGHT  04/25/2021   IR ANGIO/SPINAL RIGHT  04/25/2021   IR ANGIO/SPINAL RIGHT  04/25/2021   IR ANGIO/SPINAL RIGHT  04/25/2021   IR ANGIO/SPINAL RIGHT  04/25/2021   IR ANGIOGRAM EXTREMITY BILATERAL  04/25/2021   IR RADIOLOGIST EVAL & MGMT  03/21/2021   RADIOLOGY WITH ANESTHESIA N/A 04/25/2021   Procedure: IR WITH ANESTHESIA SPINAL ANGIOGRAM;  Surgeon: Luanne Bras, MD;  Location: [facility];  Service: Radiology;  Laterality: N/A;    FAMILY  HISTORY: Family History  Problem Relation Age of Onset   Cancer Maternal Grandmother     SOCIAL HISTORY:  Social History   Socioeconomic History   Marital status: Single    Spouse name: Not on file   Number of children: 0   Years of education: HS   Highest education level: Not on file  Occupational History   Occupation: On disability  Tobacco Use   Smoking status: Never   Smokeless tobacco: Never  Vaping Use   Vaping Use: Never used  Substance and Sexual Activity   Alcohol use: Not Currently   Drug use: Never   Sexual activity: Not Currently  Other Topics Concern   Not on file  Social History Narrative   Right handed   1 glass tea per day   Coffee sometimes    No Soda   Lives with parents.   Social Determinants of Health   Financial Resource Strain: Not on file  Food Insecurity: Not on file  Transportation Needs: Not on file  Physical Activity: Not on file  Stress: Not on file  Social Connections: Not on file  Intimate Partner Violence: Not on file     PHYSICAL EXAM  Vitals:   05/23/22 1429  BP: (!) 142/80  Pulse: 63  Weight: 177 lb 8 oz (80.5 kg)  Height: 5\' 10"  (1.778 m)    Body mass index is 25.47 kg/m.   General: The patient is well-developed and well-nourished and in no acute distress  HEENT:  Head is [place]/AT.  Sclera are anicteric.    Neck:  the neck is nontender.  Skin: Extremities are without rash or  edema.  Neurologic Exam  Mental status: The patient is alert and oriented x 3 at the time of the examination. The patient has apparent normal recent and remote memory, with an apparently normal attention span and concentration ability.   Speech is normal.  Cranial nerves: Extraocular movements are full.  Facial strength and sensation was normal.  No obvious hearing deficits are noted.  Motor:  Muscle bulk is normal.   Tone is normal in the arms and increased in the right leg.. Strength is  5 / 5 in the arms, 4/5 in the left leg.  Strength  was 1/5 in the right iliopsoas and 2 -/5 to 2/5 elsewhere in the right leg.  Sensory: Sensory testing is intact to pinprick, soft touch and vibration sensation in the arms.  He has reduced sensation to touch/temperature below T9/T10 on the right worse .  Normal vibration sensation bilateral   Coordination: Cerebellar testing reveals good finger-nose-finger and unable to do a right leg) left leg.  Gait and station: Station is normal.  He needs support to stand.  Reflexes: Deep tendon reflexes are increased in the legs, clonus at ankles, right > left      ASSESSMENT AND PLAN  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HCC)  Weakness of both lower extremities  Neurogenic bowel  Neurogenic bladder  Spasticity      I believe spinal cord issue is more likely from a small hemorrhage rather than an inflammatory transverse myelitis.     Spinal Angiogram showed no dural AV fistula.     Continue tizanidine.   Dysesthesias currently are mild but if they worsen consider gabapentin or lamotrigine. 3.    He will return to see me in as needed if there are new or worsening neurologic symptoms.   Fynlee Rowlands A. [name], MD, [facility] 05/23/2022,  5:84 PM Certified in Neurology, Clinical Neurophysiology, Sleep Medicine and Neuroimaging  [facility] [address] [place], [place] [address] [phone]

## 2022-05-24 ENCOUNTER — Encounter: Payer: Self-pay | Admitting: Physical Medicine and Rehabilitation

## 2022-05-24 ENCOUNTER — Encounter
Payer: BC Managed Care – PPO | Attending: Physical Medicine and Rehabilitation | Admitting: Physical Medicine and Rehabilitation

## 2022-05-24 VITALS — BP 153/89 | HR 60 | Ht 70.0 in | Wt 177.0 lb

## 2022-05-24 DIAGNOSIS — G8929 Other chronic pain: Secondary | ICD-10-CM | POA: Diagnosis present

## 2022-05-24 DIAGNOSIS — M546 Pain in thoracic spine: Secondary | ICD-10-CM | POA: Insufficient documentation

## 2022-05-24 DIAGNOSIS — R252 Cramp and spasm: Secondary | ICD-10-CM

## 2022-05-24 DIAGNOSIS — Z993 Dependence on wheelchair: Secondary | ICD-10-CM | POA: Diagnosis present

## 2022-05-24 DIAGNOSIS — G8220 Paraplegia, unspecified: Secondary | ICD-10-CM | POA: Diagnosis present

## 2022-05-24 DIAGNOSIS — G373 Acute transverse myelitis in demyelinating disease of central nervous system: Secondary | ICD-10-CM

## 2022-05-24 NOTE — Progress Notes (Signed)
Subjective:    Patient ID: Maurice Cruz, male    DOB: [birth year], 56 y.o.   MRN: [id number]  HPI Pt is a 56 yr old R handed male with hx of HTN who developed transverse myelitis d'xd in 7/21. With neurogenic bowel and bladder and spasticity-   S/P steroid IV and IVIG- no plasmapheresis. now on meds for BP.  Here for f/u on transverse myelitis/paraplegia. Also has spasticity of RLE and depression  Looking into L foot pedal and hand controls.   Wants to be on own and get own place. Drives him nuts.    Pain 2-3/10 - worse after therapy. Takes Tramadol or Valium every night.  If it's therapy, takes Tramadol, and Valium other days.  Therapy 2x/week- next week is last 2 visits.   Great- got AFO today- on R foot.   Spasticity- has gotten better- continues to improve!  LLE feels more numb than R side and R side more normal, although not perfectly normal.     Pain Inventory Average Pain 3 Pain Right Now 2 My pain is constant and aching  LOCATION OF PAIN  back, right greater toe  BOWEL Number of stools per week: 2-3 Oral laxative use Yes  Type of laxative Miralax Enema or suppository use Yes    BLADDER  In and out cath, frequency 4-5 times daily Able to self cath Yes  Difficulty starting stream Yes  Incomplete bladder emptying Yes    Mobility walk with assistance use a walker how many minutes can you walk? 5-10 mins do you drive?  no use a wheelchair transfers alone Do you have any goals in this area?  yes  Function disabled: date disabled 04/13/2020 I need assistance with the following:  meal prep, household duties, and shopping Do you have any goals in this area?  yes  Neuro/Psych weakness trouble walking spasms confusion  Prior Studies Any changes since last visit?  no  Physicians involved in your care Any changes since last visit?  no   Family History  Problem Relation Age of Onset   Cancer Maternal Grandmother    Social History    Socioeconomic History   Marital status: Single    Spouse name: Not on file   Number of children: 0   Years of education: HS   Highest education level: Not on file  Occupational History   Occupation: On disability  Tobacco Use   Smoking status: Never   Smokeless tobacco: Never  Vaping Use   Vaping Use: Never used  Substance and Sexual Activity   Alcohol use: Not Currently   Drug use: Never   Sexual activity: Not Currently  Other Topics Concern   Not on file  Social History Narrative   Right handed   1 glass tea per day   Coffee sometimes    No Soda   Lives with parents.   Social Determinants of Health   Financial Resource Strain: Not on file  Food Insecurity: Not on file  Transportation Needs: Not on file  Physical Activity: Not on file  Stress: Not on file  Social Connections: Not on file   Past Surgical History:  Procedure Laterality Date   Anorectal biospy  02/09/2021   Colon Biospy  02/09/2021   IR ANGIO INTRA EXTRACRAN SEL COM CAROTID INNOMINATE BILAT MOD SED  04/25/2021   IR ANGIO VERTEBRAL SEL SUBCLAVIAN INNOMINATE UNI L MOD SED  04/25/2021   IR ANGIO VERTEBRAL SEL VERTEBRAL UNI R MOD SED  04/25/2021  IR ANGIO/SPINAL LEFT  04/25/2021   IR ANGIO/SPINAL LEFT  04/25/2021   IR ANGIO/SPINAL LEFT  04/25/2021   IR ANGIO/SPINAL LEFT  04/25/2021   IR ANGIO/SPINAL LEFT  04/25/2021   IR ANGIO/SPINAL LEFT  04/25/2021   IR ANGIO/SPINAL LEFT  04/25/2021   IR ANGIO/SPINAL LEFT  04/25/2021   IR ANGIO/SPINAL LEFT  04/25/2021   IR ANGIO/SPINAL LEFT  04/25/2021   IR ANGIO/SPINAL LEFT  04/25/2021   IR ANGIO/SPINAL LEFT  04/25/2021   IR ANGIO/SPINAL LEFT  04/25/2021   IR ANGIO/SPINAL RIGHT  04/25/2021   IR ANGIO/SPINAL RIGHT  04/25/2021   IR ANGIO/SPINAL RIGHT  04/25/2021   IR ANGIO/SPINAL RIGHT  04/25/2021   IR ANGIO/SPINAL RIGHT  04/25/2021   IR ANGIO/SPINAL RIGHT  04/25/2021   IR ANGIO/SPINAL RIGHT  04/25/2021   IR ANGIO/SPINAL RIGHT  04/25/2021   IR ANGIO/SPINAL RIGHT  04/25/2021    IR ANGIO/SPINAL RIGHT  04/25/2021   IR ANGIO/SPINAL RIGHT  04/25/2021   IR ANGIO/SPINAL RIGHT  04/25/2021   IR ANGIO/SPINAL RIGHT  04/25/2021   IR ANGIO/SPINAL RIGHT  04/25/2021   IR ANGIOGRAM EXTREMITY BILATERAL  04/25/2021   IR RADIOLOGIST EVAL & MGMT  03/21/2021   RADIOLOGY WITH ANESTHESIA N/A 04/25/2021   Procedure: IR WITH ANESTHESIA SPINAL ANGIOGRAM;  Surgeon: Luanne Bras, MD;  Location: [facility];  Service: Radiology;  Laterality: N/A;   Past Medical History:  Diagnosis Date   Anxiety    Asthma    as a child   Depression    ED (erectile dysfunction)    GERD (gastroesophageal reflux disease)    Hx of spinal cord injury 03/2020   Hypertension    Neurogenic bladder    self caths   Neurogenic bowel    has to be digitially stimulated - 04/24/21   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HCC)    BP (!) 153/89   Pulse 60   Ht 5\' 10"  (1.778 m)   Wt 177 lb (80.3 kg)   SpO2 95%   BMI 25.40 kg/m   Opioid Risk Score:   Fall Risk Score:  `1  Depression screen [facility] 2/9     02/04/2022    2:32 PM 10/26/2021    1:39 PM 12/08/2020   10:17 AM  Depression screen PHQ 2/9  Decreased Interest 0 0 3  Down, Depressed, Hopeless 0 0 3  PHQ - 2 Score 0 0 6  Altered sleeping   1  Tired, decreased energy   1  Change in appetite   2  Feeling bad or failure about yourself    3  Trouble concentrating   0  Moving slowly or fidgety/restless   0  Suicidal thoughts   0  PHQ-9 Score   13  Difficult doing work/chores   Very difficult    Review of Systems  Gastrointestinal:  Positive for abdominal pain and constipation.  Musculoskeletal:  Positive for gait problem.       Spasms  Neurological:  Positive for weakness.  Psychiatric/Behavioral:         Depression  All other systems reviewed and are negative.      Objective:   Physical Exam Awake alert, frustrated; in manual w/c, NAD  Neuro: Ue's intact to light touch RLE's Decreased is around 40% ( of sensation LLE-  reduced to 60^ (used to be 70%) but also have tingling Spasticity- MAS of 1+ to 2 in RLE and almost normal in LLE Sustained clonus on RLE  and 2-3 beats on LLE  MS: LLE- HF 4+/5; KE 4+/5; DF and PF 5-/5 RLE- HF 2-/5; KE 2/5; DF 2-/5 and PF 2-/5  Can stand in front of w/c with R AFO- but holds on intermittently.        Assessment & Plan:   Pt is a 56 yr old R handed male with hx of HTN who developed transverse myelitis d'xd in 7/21. With neurogenic bowel and bladder and spasticity-   S/P steroid IV and IVIG- no plasmapheresis. now on meds for BP.  Here for f/u on transverse myelitis/paraplegia. Also has spasticity of RLE and depression   [facility] support group- [id number]- 6-7 pm- last Thursday of the month-   2.  On disability- Medicaid to start in January- so then can order CAP.    3. Call [facility] to get on wait list.    4.  Con't Zanaflex/Tizanidine and Valium prn- takes Zanaflex nightly. Doesn's tneed refills.    5. Con't Tramadol prn- doesn't need refills   6. F/U in 4 months- double appt- [facility]   I spent a total of 24   minutes on total care today- >50% coordination of care- due to  discussion of getting his own place- spasticity and progression of strength.

## 2022-05-24 NOTE — Patient Instructions (Signed)
Pt is a 56 yr old R handed male with hx of HTN who developed transverse myelitis d'xd in 7/21. With neurogenic bowel and bladder and spasticity-   S/P steroid IV and IVIG- no plasmapheresis. now on meds for BP.  Here for f/u on transverse myelitis/paraplegia. Also has spasticity of RLE and depression   [facility] support group- [id number]- 6-7 pm- last Thursday of the month-   2.  On disability- Medicaid to start in January- so then can order CAP.    3. Call [facility] to get on wait list.    4.  Con't Zanaflex/Tizanidine and Valium prn- takes Zanaflex nightly. Doesn's tneed refills.    5. Con't Tramadol prn- doesn't need refills   6. F/U in 4 months- double appt- [facility]

## 2022-05-29 ENCOUNTER — Encounter: Payer: Self-pay | Admitting: Physical Therapy

## 2022-05-29 ENCOUNTER — Ambulatory Visit: Payer: BC Managed Care – PPO | Admitting: Physical Therapy

## 2022-05-29 DIAGNOSIS — R2681 Unsteadiness on feet: Secondary | ICD-10-CM

## 2022-05-29 DIAGNOSIS — R2689 Other abnormalities of gait and mobility: Secondary | ICD-10-CM

## 2022-05-29 DIAGNOSIS — M6281 Muscle weakness (generalized): Secondary | ICD-10-CM

## 2022-05-29 DIAGNOSIS — R293 Abnormal posture: Secondary | ICD-10-CM

## 2022-05-29 NOTE — Therapy (Signed)
OUTPATIENT PHYSICAL THERAPY TREATMENT NOTE   Patient Name: Maurice Cruz MRN: [id number] DOB:10-23-1965, 56 y.o., male Today's Date: 05/29/2022  PCP: Dr. Glendon Axe REFERRING PROVIDER: Courtney Heys, MD    PT End of Session - 05/29/22 1459     Visit Number 23    Number of Visits 27    Date for PT Re-Evaluation 05/31/22    Authorization Type BCBS (60 visit limit annually)    PT Start Time 33    PT Stop Time 1538    PT Time Calculation (min) 46 min    Equipment Utilized During Treatment Gait belt    Activity Tolerance Patient tolerated treatment well    Behavior During Therapy WFL for tasks assessed/performed             Past Medical History:  Diagnosis Date   Anxiety    Asthma    as a child   Depression    ED (erectile dysfunction)    GERD (gastroesophageal reflux disease)    Hx of spinal cord injury 03/2020   Hypertension    Neurogenic bladder    self caths   Neurogenic bowel    has to be digitially stimulated - 04/24/21   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[place])    bilateral legs   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[place])    Past Surgical History:  Procedure Laterality Date   Anorectal biospy  02/09/2021   Colon Biospy  02/09/2021   IR ANGIO INTRA EXTRACRAN SEL COM CAROTID INNOMINATE BILAT MOD SED  04/25/2021   IR ANGIO VERTEBRAL SEL SUBCLAVIAN INNOMINATE UNI L MOD SED  04/25/2021   IR ANGIO VERTEBRAL SEL VERTEBRAL UNI R MOD SED  04/25/2021   IR ANGIO/SPINAL LEFT  04/25/2021   IR ANGIO/SPINAL LEFT  04/25/2021   IR ANGIO/SPINAL LEFT  04/25/2021   IR ANGIO/SPINAL LEFT  04/25/2021   IR ANGIO/SPINAL LEFT  04/25/2021   IR ANGIO/SPINAL LEFT  04/25/2021   IR ANGIO/SPINAL LEFT  04/25/2021   IR ANGIO/SPINAL LEFT  04/25/2021   IR ANGIO/SPINAL LEFT  04/25/2021   IR ANGIO/SPINAL LEFT  04/25/2021   IR ANGIO/SPINAL LEFT  04/25/2021   IR ANGIO/SPINAL LEFT  04/25/2021   IR ANGIO/SPINAL LEFT  04/25/2021   IR ANGIO/SPINAL RIGHT  04/25/2021   IR ANGIO/SPINAL RIGHT  04/25/2021   IR ANGIO/SPINAL RIGHT  04/25/2021   IR  ANGIO/SPINAL RIGHT  04/25/2021   IR ANGIO/SPINAL RIGHT  04/25/2021   IR ANGIO/SPINAL RIGHT  04/25/2021   IR ANGIO/SPINAL RIGHT  04/25/2021   IR ANGIO/SPINAL RIGHT  04/25/2021   IR ANGIO/SPINAL RIGHT  04/25/2021   IR ANGIO/SPINAL RIGHT  04/25/2021   IR ANGIO/SPINAL RIGHT  04/25/2021   IR ANGIO/SPINAL RIGHT  04/25/2021   IR ANGIO/SPINAL RIGHT  04/25/2021   IR ANGIO/SPINAL RIGHT  04/25/2021   IR ANGIOGRAM EXTREMITY BILATERAL  04/25/2021   IR RADIOLOGIST EVAL & MGMT  03/21/2021   RADIOLOGY WITH ANESTHESIA N/A 04/25/2021   Procedure: IR WITH ANESTHESIA SPINAL ANGIOGRAM;  Surgeon: Luanne Bras, MD;  Location: [facility];  Service: Radiology;  Laterality: N/A;   Patient Active Problem List   Diagnosis Date Noted   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[place]) 05/21/2021   Anti-RNP antibodies present 05/21/2021   Weakness of both lower extremities 02/05/2021   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   Neurogenic bladder 12/08/2020   Neurogenic bowel 12/08/2020   Paraplegia following spinal cord injury ([place]) 12/08/2020   Wheelchair dependence 12/08/2020   Spasticity 12/08/2020   Chronic bilateral thoracic back pain 12/08/2020    ONSET  DATE: 02/04/22 (date of referral)  REFERRING DIAG: G37.3 (ICD-10-CM) - 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[place]) G82.20 (ICD-10-CM) - Paraplegia following spinal cord injury ([place])   THERAPY DIAG:  Other abnormalities of gait and mobility  Muscle weakness (generalized)  Unsteadiness on feet  Abnormal posture  SUBJECTIVE:                                                                                                                                                                                              SUBJECTIVE STATEMENT: Pt received AFO, had follow-up with Dr. Kerman Passey and [place], nothing to report.  No acute changes or falls.  Still thinking about modified driving class and using loftstrands.  Pt accompanied by: family member and self  - dad  PERTINENT HISTORY: Transverse myelitis,  neurogenic bowel and bladder  PAIN:  PAIN:  Are you having pain? Yes: NPRS scale: 2.5-3/10 Pain location: low back Pain description: sore, achy Aggravating factors: walking Relieving factors: nothing  PRECAUTIONS: None  WEIGHT BEARING RESTRICTIONS No  FALLS: Has patient fallen in last 6 months? No  LIVING ENVIRONMENT: Lives with:  mother and father Lives in: House/apartment Stairs: Yes: Internal: 12 steps; on right going up and on left going up and External: 1 steps; none, pt has chair lift at home for stairs. Has following equipment at home: Gilford Rile - 2 wheeled, Wheelchair (manual), and bed side commode  PLOF:  Independent with dressing, self care, transfer, parents help with cooking, cleaning.  PATIENT GOALS Improve strength in R leg, improve walking  OBJECTIVE:   MMT Right EAVL Left EVAL  Hip flexion 1+ 3+  Hip extension    Hip abduction 1+ 3+  Hip adduction 1+ 3+  Hip internal rotation    Hip external rotation    Knee flexion 1+ 4  Knee extension 1+ 4  Ankle dorsiflexion 1+ 3+  Ankle plantarflexion 1+ 3+  Ankle inversion    Ankle eversion    (Blank rows = not tested)  Pt uses manual wheelchair when he goes to MD appts or stores. Inside the house he uses walker for household mobility.  FUNCTIONAL TESTs:  5 times sit to stand: 47 sec Timed up and go (TUG): 1 min with RW 10 meter walk test: 0.27 m/s with RW  TODAY'S TREATMENT:  Reviewed HEP: -Tandem on pillow EO > EC 2x30sec each (pt has not been able to try these at home) -verbally reviewed all others with pt compliant and independent in safe performance per report with endorsement by father  Discussed safety with trial of stairs at home with pt and dad.  Discussed purchasing loftstrands and returning to  PT if functionality changes for better or worse with new referral.  Brief assessment of pt independently donning and doffing AFO, he has removable insoles to protect plantar aspect of foot now vs prior shoes,  new shoes are similar in design to prior.  GAIT: Gait pattern:  mildly dec stance of RLE, step through pattern, decreased arm swing- Right, decreased arm swing- Left, decreased step length- Right, decreased step length- Left, and decreased stride length Distance walked: various clinic distances between other activities Assistive device utilized: None Level of assistance: CGA and HHA Comments: Pt demonstrates improved bil foot clearance without use of AFO.  He intermittently compensates on RLE w/ inc hip and knee flexion.  -Leg press bilateral #80 2x15 -NuStep x8 minutes using BLE only for neural priming, endurance, and LE strengthening on L5 w/ step goal of 65, achieved total steps 648.  PATIENT EDUCATION: Education details: Continue HEP and walking. Person educated: Patient Education method: Explanation Education comprehension: verbalized understanding   HOME EXERCISE PROGRAM: Access Code: [email or web address] URL: [email or web address] Date: 05/13/2022 Prepared by: Elease Etienne  Exercises - Sit to Stand with Resistance Around Legs  - 1 x daily - 5 x weekly - 2 sets - 8 reps - Standing Single Leg Stance with Counter Support  - 1 x daily - 5 x weekly - 1 sets - 5 reps - work to 30 seconds hold - Side-lying Knee Flexion  - 1 x daily - 4-5 x weekly - 2-3 sets - 3-5 reps - Walking with Counter Support  - 1 x daily - 7 x weekly - 4-5 sets - 10 reps - Tandem Stance on Foam Pad with Eyes Open  - 1 x daily - 5 x weekly - 1 sets - 3-4 reps - 30 seconds hold - Tandem Stance on Foam Pad with Eyes Closed  - 1 x daily - 5 x weekly - 1 sets - 3-4 reps - 30 seconds hold - Side Stepping with Resistance at Ankles and Counter Support  - 1 x daily - 7 x weekly - 3 sets - 10 reps  Added on 03/01/2022  Pt educated on trying L sidelying with hooklying position: R UE providing  AA hip flexion to R hip and then pt pushes leg into extension with UE resisting it. Pt educated to perform alternating AA  flexion and resisted extension for 3 x 10 daily for HEP  Goals reviewed with patient? Yes UPDATED GOALS: STGs=LONG TERM GOALS: Target date: 05/31/2022 (assess and update every 30 days)   Patient will demo gait speed with LRAD of at least 0.86m/s to improve his functional independence. Baseline: 0.50m/s with RW (02/08/22); 0.55 m/sec w/ RW (04/03/2022); 0.46 m/sec w/ RW and right posterior [place] AFO (05/06/2022) Goal status: ONGOING  2.  Pt will demo TUG score of <20 sec with LRAD to improve functional mobility. Baseline: 1 min (02/08/22); 22.56 sec w/ RW (04/03/2022); 24.13 sec w/ RW (05/06/2022) Goal status: ONGOING  3.  Patient will be able to perform 5x sit to stand with use of bil UE support in <20 seconds to improve functional LE strength and power. Baseline: 47 seconds (01/29/22); 27 sec w/ BUE support (04/03/2022); 26.57 sec (05/06/2022) Goal status: ONGOING  4.  Pt will ambulate 500' w/ LRAD and right posterior [place] AFO at supervision level to improve functional mobility to limited community distances.  Baseline:  25' w/ RW SBA/CGA; 432' w/ RW and right posterior [place] AFO supervision (05/06/2022)  Goal status:  REVISED  5.  Stair training will be initiated with caregiver training for guarding safety to improve safe independence with access to home environment.  Baseline:  To be initiated next visit.  Goal status:  ONGOING  ASSESSMENT:  CLINICAL IMPRESSION: Continued to progress LE strengthening this session with discussion of continued activity in accessible gym with partner for safety and instruction on machinery appropriate for level as used in therapy.  He is independent with AFO management and use and continues to benefit from walking at home with LRAD.  He is still unsure about progression to loftstrands so instructed to continue w/ RW and walking at counter.  Will assess LTGs next session with plan to discharge which pt is in agreement to at current.   OBJECTIVE IMPAIRMENTS  Abnormal gait, decreased activity tolerance, decreased balance, decreased endurance, decreased mobility, difficulty walking, decreased ROM, decreased strength, hypomobility, increased fascial restrictions, impaired flexibility, impaired sensation, impaired tone, improper body mechanics, postural dysfunction, and pain.   ACTIVITY LIMITATIONS cleaning, community activity, driving, meal prep, laundry, and yard work.   PERSONAL FACTORS Age, Past/current experiences, and Time since onset of injury/illness/exacerbation are also affecting patient's functional outcome.    REHAB POTENTIAL: Good  CLINICAL DECISION MAKING: Stable/uncomplicated  EVALUATION COMPLEXITY: Low  PLAN: PT FREQUENCY: 2x/week  PT DURATION: other: 5 weeks  (re-cert date to accommodate pt out-of-town for 2 weeks and potential delay in scheduling)  PLANNED INTERVENTIONS: Therapeutic exercises, Therapeutic activity, Neuromuscular re-education, Balance training, Gait training, Patient/Family education, Joint mobilization, Stair training, Orthotic/Fit training, Aquatic Therapy, Spinal mobilization, Cryotherapy, Moist heat, and Manual therapy  PLAN FOR NEXT SESSION:  Assess LTGs-D/C!  Bary Richard, PT, DPT 05/29/2022, 3:49 PM

## 2022-05-31 ENCOUNTER — Encounter: Payer: Self-pay | Admitting: Physical Therapy

## 2022-05-31 ENCOUNTER — Ambulatory Visit: Payer: BC Managed Care – PPO | Attending: Physical Medicine and Rehabilitation | Admitting: Physical Therapy

## 2022-05-31 DIAGNOSIS — R2689 Other abnormalities of gait and mobility: Secondary | ICD-10-CM | POA: Insufficient documentation

## 2022-05-31 DIAGNOSIS — R2681 Unsteadiness on feet: Secondary | ICD-10-CM | POA: Insufficient documentation

## 2022-05-31 DIAGNOSIS — M6281 Muscle weakness (generalized): Secondary | ICD-10-CM | POA: Insufficient documentation

## 2022-05-31 NOTE — Therapy (Signed)
OUTPATIENT PHYSICAL THERAPY TREATMENT NOTE/DISCHARGE SUMMARY   Patient Name: Maurice Cruz MRN: [id number] DOB:Oct 07, 1965, 56 y.o., male Today's Date: 05/31/2022  PCP: Dr. Glendon Axe REFERRING PROVIDER: Courtney Heys, MD   PHYSICAL THERAPY DISCHARGE SUMMARY  Visits from Start of Care: 24  Current functional level related to goals / functional outcomes: See clinical impression statement.   Remaining deficits: Right foot drop requiring use of AFO for inc ambulatory distances, requires supervision and step to for safe stair navigation, uses RW as primary AD   Education / Equipment: Discharge plan, continue discussion with neurologist about adaptive driving requirements, continue HEP, purchase loftstrands to practice walking at home.  Patient agrees to discharge. Patient goals were partially met. Patient is being discharged due to maximized rehab potential.     PT End of Session - 05/31/22 1024     Visit Number 24    Number of Visits 27    Date for PT Re-Evaluation 05/31/22    Authorization Type [employer]BS (60 visit limit annually)    PT Start Time 1022    PT Stop Time 1100    PT Time Calculation (min) 38 min    Equipment Utilized During Treatment Gait belt    Activity Tolerance Patient tolerated treatment well    Behavior During Therapy WFL for tasks assessed/performed             Past Medical History:  Diagnosis Date   Anxiety    Asthma    as a child   Depression    ED (erectile dysfunction)    GERD (gastroesophageal reflux disease)    Hx of spinal cord injury 03/2020   Hypertension    Neurogenic bladder    self caths   Neurogenic bowel    has to be digitially stimulated - 04/24/21   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[place])    bilateral legs   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[place])    Past Surgical History:  Procedure Laterality Date   Anorectal biospy  02/09/2021   Colon Biospy  02/09/2021   IR ANGIO INTRA EXTRACRAN SEL COM CAROTID INNOMINATE BILAT MOD SED  04/25/2021   IR ANGIO VERTEBRAL SEL  SUBCLAVIAN INNOMINATE UNI L MOD SED  04/25/2021   IR ANGIO VERTEBRAL SEL VERTEBRAL UNI R MOD SED  04/25/2021   IR ANGIO/SPINAL LEFT  04/25/2021   IR ANGIO/SPINAL LEFT  04/25/2021   IR ANGIO/SPINAL LEFT  04/25/2021   IR ANGIO/SPINAL LEFT  04/25/2021   IR ANGIO/SPINAL LEFT  04/25/2021   IR ANGIO/SPINAL LEFT  04/25/2021   IR ANGIO/SPINAL LEFT  04/25/2021   IR ANGIO/SPINAL LEFT  04/25/2021   IR ANGIO/SPINAL LEFT  04/25/2021   IR ANGIO/SPINAL LEFT  04/25/2021   IR ANGIO/SPINAL LEFT  04/25/2021   IR ANGIO/SPINAL LEFT  04/25/2021   IR ANGIO/SPINAL LEFT  04/25/2021   IR ANGIO/SPINAL RIGHT  04/25/2021   IR ANGIO/SPINAL RIGHT  04/25/2021   IR ANGIO/SPINAL RIGHT  04/25/2021   IR ANGIO/SPINAL RIGHT  04/25/2021   IR ANGIO/SPINAL RIGHT  04/25/2021   IR ANGIO/SPINAL RIGHT  04/25/2021   IR ANGIO/SPINAL RIGHT  04/25/2021   IR ANGIO/SPINAL RIGHT  04/25/2021   IR ANGIO/SPINAL RIGHT  04/25/2021   IR ANGIO/SPINAL RIGHT  04/25/2021   IR ANGIO/SPINAL RIGHT  04/25/2021   IR ANGIO/SPINAL RIGHT  04/25/2021   IR ANGIO/SPINAL RIGHT  04/25/2021   IR ANGIO/SPINAL RIGHT  04/25/2021   IR ANGIOGRAM EXTREMITY BILATERAL  04/25/2021   IR RADIOLOGIST EVAL & MGMT  03/21/2021   RADIOLOGY WITH ANESTHESIA N/A 04/25/2021  Procedure: IR WITH ANESTHESIA SPINAL ANGIOGRAM;  Surgeon: Luanne Bras, MD;  Location: [facility];  Service: Radiology;  Laterality: N/A;   Patient Active Problem List   Diagnosis Date Noted   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[place]) 05/21/2021   Anti-RNP antibodies present 05/21/2021   Weakness of both lower extremities 02/05/2021   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   Neurogenic bladder 12/08/2020   Neurogenic bowel 12/08/2020   Paraplegia following spinal cord injury ([place]) 12/08/2020   Wheelchair dependence 12/08/2020   Spasticity 12/08/2020   Chronic bilateral thoracic back pain 12/08/2020    ONSET DATE: 02/04/22 (date of referral)  REFERRING DIAG: G37.3 (ICD-10-CM) - 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[place]) G82.20 (ICD-10-CM) - Paraplegia  following spinal cord injury ([place])   THERAPY DIAG:  Other abnormalities of gait and mobility  Muscle weakness (generalized)  Unsteadiness on feet  SUBJECTIVE:                                                                                                                                                                                              SUBJECTIVE STATEMENT: Pt is getting used to his AFO.  He is wearing AFO only when walking and he has the shoes with the removable inserts.  He denies falls or other acute changes.  Pt accompanied by: family member and self  - dad  PERTINENT HISTORY: Transverse myelitis, neurogenic bowel and bladder  PAIN:  PAIN:  Are you having pain? Yes: NPRS scale: 2/10 Pain location: low back Pain description: sore, achy Aggravating factors: walking Relieving factors: nothing  PRECAUTIONS: None  WEIGHT BEARING RESTRICTIONS No  FALLS: Has patient fallen in last 6 months? No  LIVING ENVIRONMENT: Lives with:  mother and father Lives in: House/apartment Stairs: Yes: Internal: 12 steps; on right going up and on left going up and External: 1 steps; none, pt has chair lift at home for stairs. Has following equipment at home: Gilford Rile - 2 wheeled, Wheelchair (manual), and bed side commode  PLOF:  Independent with dressing, self care, transfer, parents help with cooking, cleaning.  PATIENT GOALS Improve strength in R leg, improve walking  OBJECTIVE:   MMT Right EAVL Left EVAL  Hip flexion 1+ 3+  Hip extension    Hip abduction 1+ 3+  Hip adduction 1+ 3+  Hip internal rotation    Hip external rotation    Knee flexion 1+ 4  Knee extension 1+ 4  Ankle dorsiflexion 1+ 3+  Ankle plantarflexion 1+ 3+  Ankle inversion    Ankle eversion    (Blank rows = not tested)  Pt uses manual wheelchair when he goes to MD appts or stores.  Inside the house he uses walker for household mobility.  FUNCTIONAL TESTs:  5 times sit to stand: 47 sec Timed up and  go (TUG): 1 min with RW 10 meter walk test: 0.27 m/s with RW  TODAY'S TREATMENT:  -5xSTS:  25.16 sec w/ BUE support -TUG:  22.81 sec w/ RW -10MWT:  18.47 sec w/ RW & R AFO = 1.79 ft/sec OR 0.54 m/sec -Reviewed gait pattern for stairs and safe supervision with pt and father. GAIT: Gait pattern: step through pattern, decreased hip/knee flexion- Right, and poor foot clearance- Right Distance walked: 83' + 115' + 115' Assistive device utilized: Environmental consultant - 2 wheeled and bilateral loftstrands, R posterior [place] AFO, and rubber tip quad cane on Left Level of assistance: SBA-supervision Comments: First bout completed w/ RW for observation of ambulatory tolerance and remaining gait deficits without cuing.  Trialed bilateral loftstrands w/ min cues for sequencing and change of stability from RW.  Pt states he plans to purchase these.  Discussed use of single on left as trialed in visit prior.  Trialed rubber tip quad cane with pt demonstrating good stability throughout on level surface and with turns.  Continues to be limited by decreased right LE knee and hip flexion during swing.  PATIENT EDUCATION: Education details: Continue HEP and walking.  Progress towards goals and to return to PT if functional change occurs or wishes to trial further gait training with loftstrands or cane as he progresses. Person educated: Patient Education method: Explanation Education comprehension: verbalized understanding   HOME EXERCISE PROGRAM: Access Code: [email or web address] URL: [email or web address] Date: 05/13/2022 Prepared by: Elease Etienne  Exercises - Sit to Stand with Resistance Around Legs  - 1 x daily - 5 x weekly - 2 sets - 8 reps - Standing Single Leg Stance with Counter Support  - 1 x daily - 5 x weekly - 1 sets - 5 reps - work to 30 seconds hold - Side-lying Knee Flexion  - 1 x daily - 4-5 x weekly - 2-3 sets - 3-5 reps - Walking with Counter Support  - 1 x daily - 7 x weekly - 4-5 sets -  10 reps - Tandem Stance on Foam Pad with Eyes Open  - 1 x daily - 5 x weekly - 1 sets - 3-4 reps - 30 seconds hold - Tandem Stance on Foam Pad with Eyes Closed  - 1 x daily - 5 x weekly - 1 sets - 3-4 reps - 30 seconds hold - Side Stepping with Resistance at Ankles and Counter Support  - 1 x daily - 7 x weekly - 3 sets - 10 reps  Added on 03/01/2022  Pt educated on trying L sidelying with hooklying position: R UE providing  AA hip flexion to R hip and then pt pushes leg into extension with UE resisting it. Pt educated to perform alternating AA flexion and resisted extension for 3 x 10 daily for HEP  Goals reviewed with patient? Yes UPDATED GOALS: STGs=LONG TERM GOALS: Target date: 05/31/2022 (assess and update every 30 days)   Patient will demo gait speed with LRAD of at least 0.28m/s to improve his functional independence. Baseline: 0.19m/s with RW (02/08/22); 0.55 m/sec w/ RW (04/03/2022); 0.46 m/sec w/ RW and right posterior [place] AFO (05/06/2022); 05/31/2022 0.54 m/sec w/ RW and R AFO Goal status: NOT MET  2.  Pt will demo TUG score of <20 sec with LRAD to improve functional mobility. Baseline: 1 min (02/08/22); 22.56 sec w/ RW (04/03/2022); 24.13  sec w/ RW (05/06/2022); 05/31/2022 22.81sec w/ RW Goal status: NOT MET  3.  Patient will be able to perform 5x sit to stand with use of bil UE support in <20 seconds to improve functional LE strength and power. Baseline: 47 seconds (01/29/22); 27 sec w/ BUE support (04/03/2022); 26.57 sec (05/06/2022); 05/31/2022 25.16 sec w/ BUE support Goal status: NOT MET  4.  Pt will ambulate 500' w/ LRAD and right posterior [place] AFO at supervision level to improve functional mobility to limited community distances.  Baseline:  230' w/ RW SBA/CGA; 93' w/ RW and right posterior [place] AFO supervision (05/06/2022); 05/31/2022 529' S* w/ R AFO  Goal status:  MET  5.  Stair training will be initiated with caregiver training for guarding safety to improve safe independence with  access to home environment.  Baseline:  To be initiated next visit.  05/31/2022 Has been initiated prior with pt not attempting at home, but caregiver reports confidence with supervision.  Goal status:  MET  ASSESSMENT:  CLINICAL IMPRESSION: Pt has partially met goals at time of discharge today.  He has made significant progress with all goals since initial evaluation for this episode of care despite not meeting all goal levels.  His gait speed has improved to 0.54 m/sec and his 5xSTS has improved to 25.16 sec w/ BUE support.  He can ambulate 529' at supervision level using his RW which is his current preferred AD.  He has not attempted stairs at home yet in favor of using his chair lift, but he has completed stair training with a caregiver present for instruction in guarding and supervision as needed.  His TUG time has decreased to 22.81 seconds just shy of goal level.  Overall he has made great progress this episode of care and is appropriate for self-maintenance of progression at home.  Pt and dad in agreement to discharge plan today.   OBJECTIVE IMPAIRMENTS Abnormal gait, decreased activity tolerance, decreased balance, decreased endurance, decreased mobility, difficulty walking, decreased ROM, decreased strength, hypomobility, increased fascial restrictions, impaired flexibility, impaired sensation, impaired tone, improper body mechanics, postural dysfunction, and pain.   ACTIVITY LIMITATIONS cleaning, community activity, driving, meal prep, laundry, and yard work.   PERSONAL FACTORS Age, Past/current experiences, and Time since onset of injury/illness/exacerbation are also affecting patient's functional outcome.    REHAB POTENTIAL: Good  CLINICAL DECISION MAKING: Stable/uncomplicated  EVALUATION COMPLEXITY: Low  PLAN: PT FREQUENCY: 2x/week  PT DURATION: other: 5 weeks  (re-cert date to accommodate pt out-of-town for 2 weeks and potential delay in scheduling)  PLANNED INTERVENTIONS:  Therapeutic exercises, Therapeutic activity, Neuromuscular re-education, Balance training, Gait training, Patient/Family education, Joint mobilization, Stair training, Orthotic/Fit training, Aquatic Therapy, Spinal mobilization, Cryotherapy, Moist heat, and Manual therapy  PLAN FOR NEXT SESSION:  N/A  Bary Richard, PT, DPT 05/31/2022, 12:30 PM

## 2022-06-19 IMAGING — XA IR ANGIO/ADRENAL/UNI*R*
11 of 23 series · 11 of 24 positions shown · IV contrast (IODINE)
Comparison: Outside MRI of the thoracic spine March 11, 2021, and
from an outside institution.

CLINICAL DATA: History of paraplegia, right leg weaker than the
left associated with a central level at about T8.

Abnormal MRI scan of the thoracolumbar spine with prominent
vasculature. Differential diagnosis of spinal vascular abnormality
to the evaluated.
EXAM:
BILATERAL COMMON CAROTID AND INNOMINATE ANGIOGRAPHY
TECHNIQUE: Informed written consent was obtained from the patient after a
thorough discussion of the procedural risks, benefits and
alternatives. All questions were addressed. Maximal Sterile Barrier
Technique was utilized including caps, mask, sterile gowns, sterile
gloves, sterile drape, hand hygiene and skin antiseptic. A timeout
was performed prior to the initiation of the procedure.

[Series 2: cerebral · 2 acquisitions, 1 frame shown (1 of 10)]
[im 1/2]
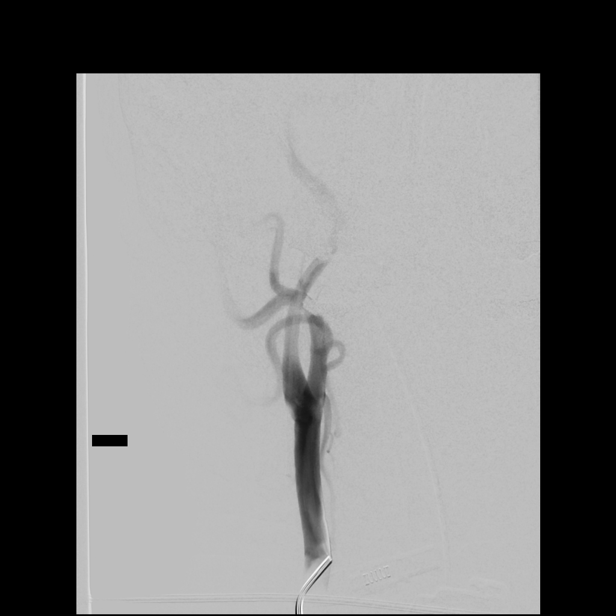

[Series 3: cerebral · 2 acquisitions, 1 frame shown (2 of 10)]
[im 1/2]
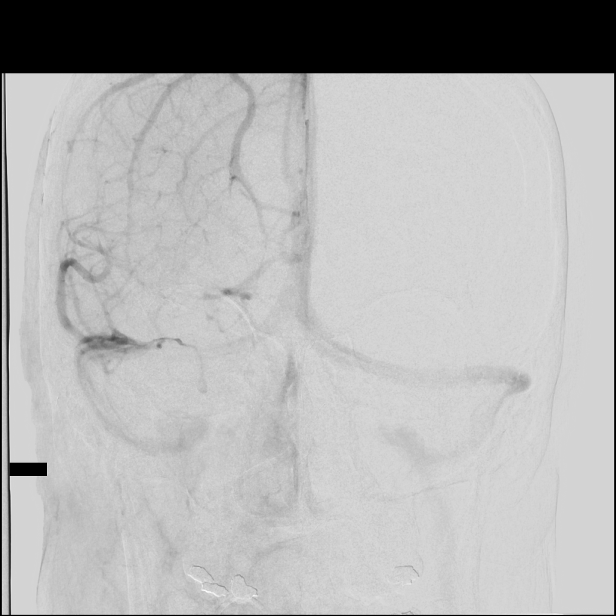

[Series 5: cerebral · 2 acquisitions, 1 frame shown (3 of 10)]
[im 1/2]
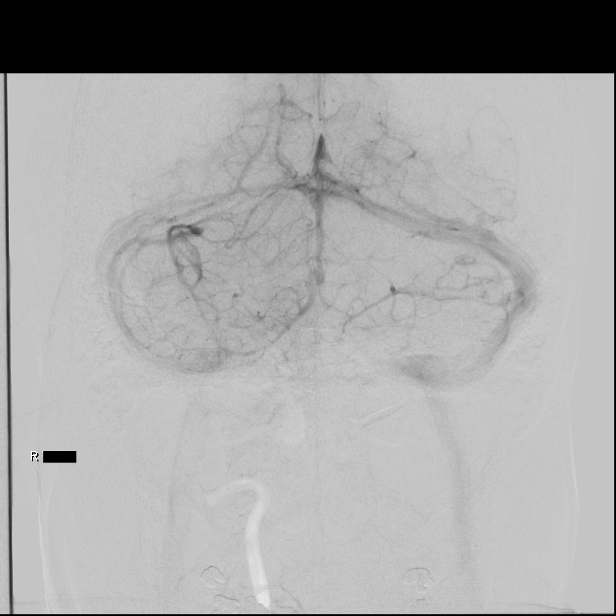

[Series 7: cerebral · 2 acquisitions, 1 frame shown (4 of 10)]
[im 1/2]
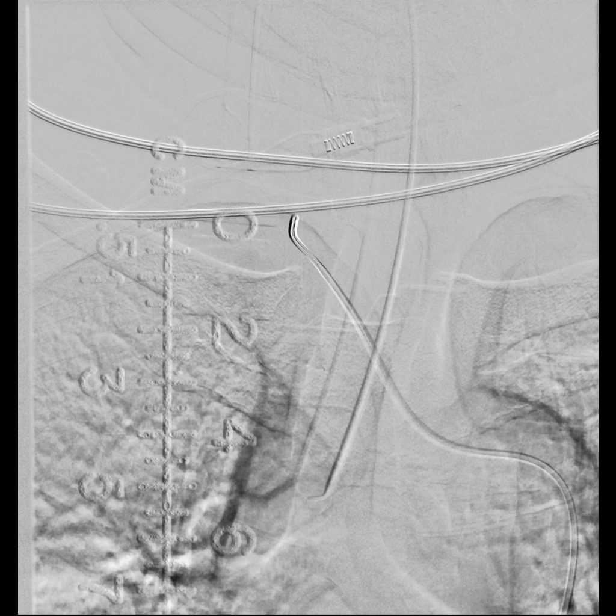

[Series 9: cerebral · 2 acquisitions, 1 frame shown (5 of 10)]
[im 1/2]
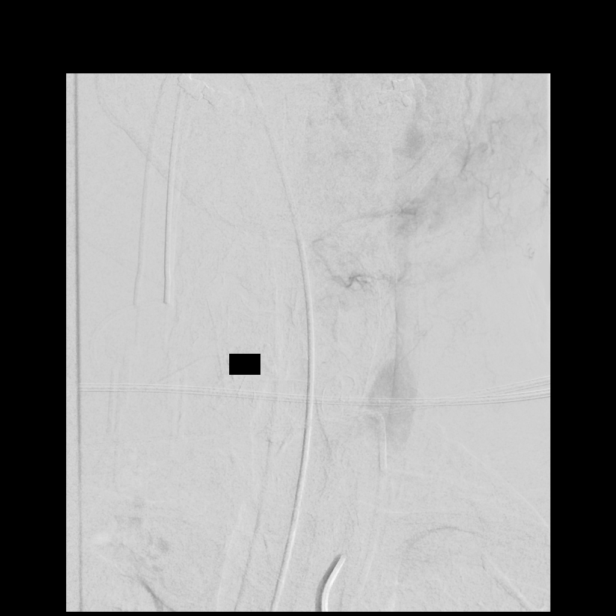

[Series 13: cerebral · 1 of 13 frames shown (6 of 10)]
[frame 7/13]
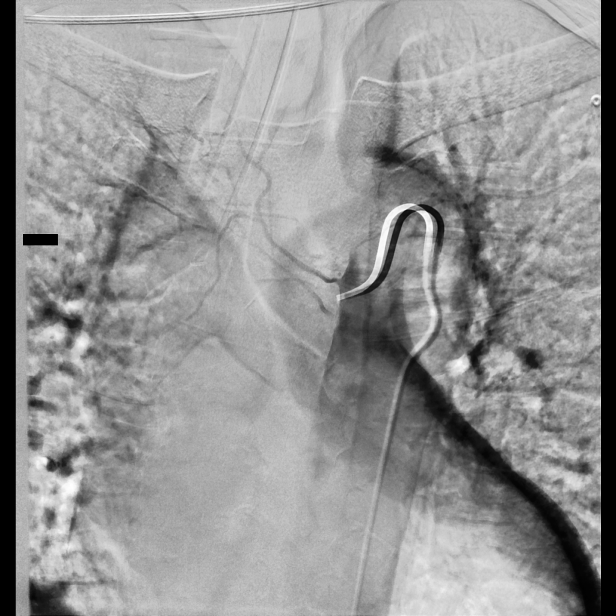

[Series 15: cerebral · 1 of 14 frames shown (7 of 10)]
[frame 8/14]
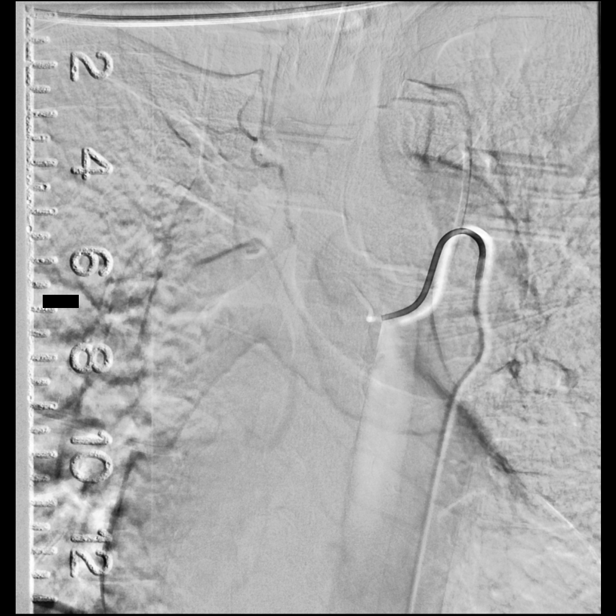

[Series 17: cerebral · 1 of 14 frames shown (8 of 10)]
[frame 8/14]
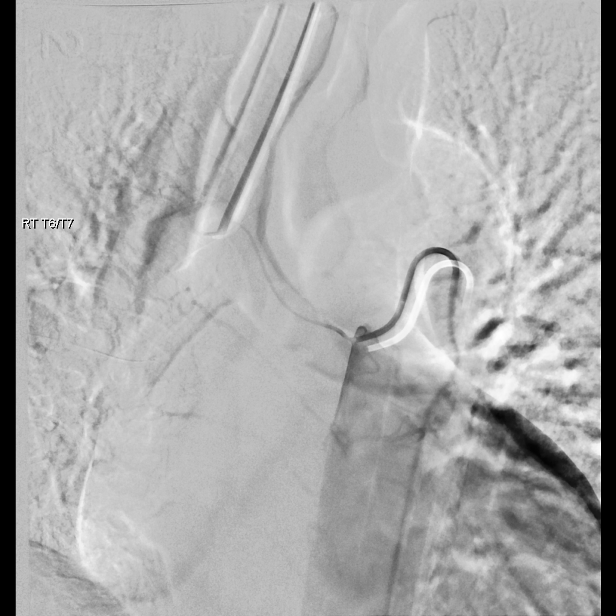

[Series 19: cerebral · 1 of 14 frames shown (9 of 10)]
[frame 3/14]
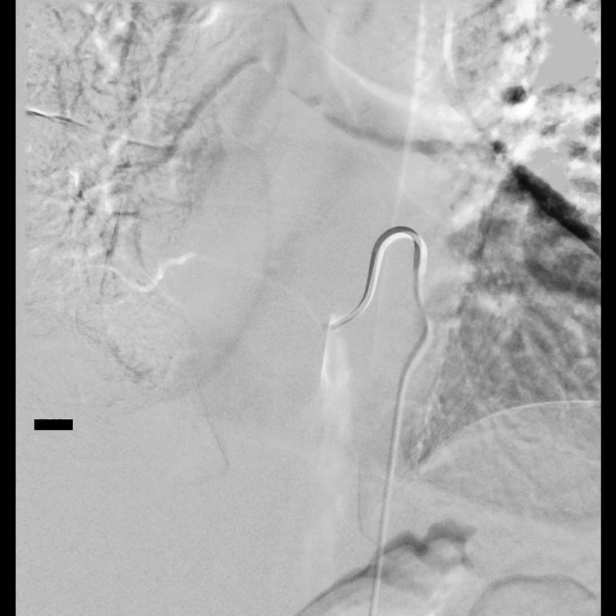

[Series 21: cerebral · 1 of 17 frames shown (10 of 10)]
[frame 3/17]
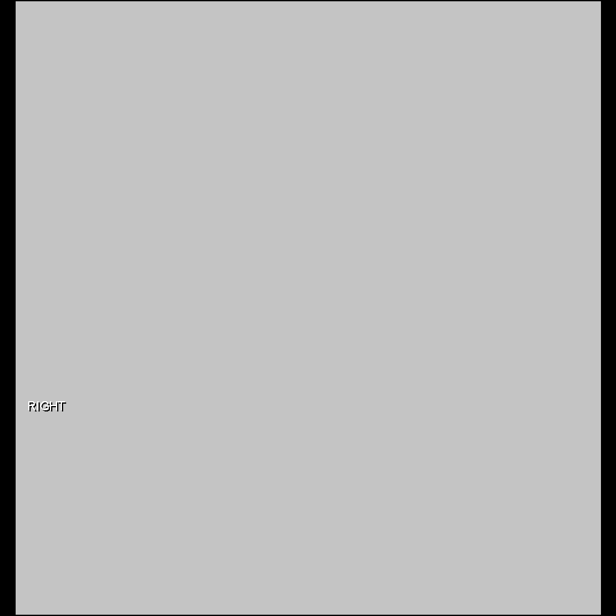

[Series 23: single · 1 of 1 slices shown]
[im 1/1]
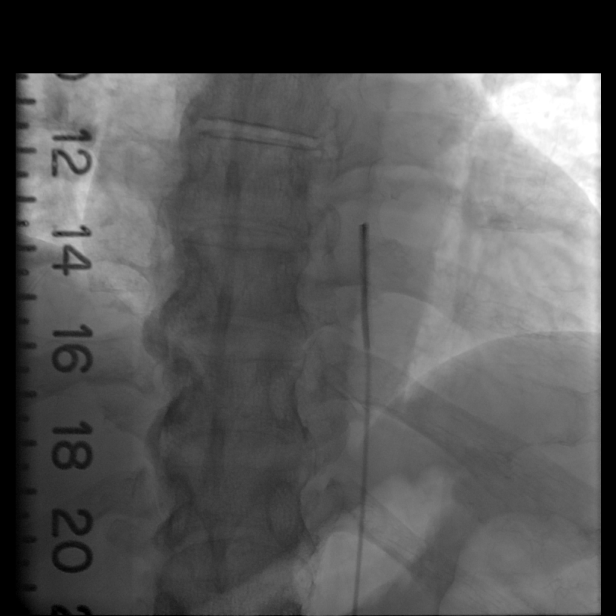

[11 of 24 positions shown; findings below may reference images not displayed]

MEDICATIONS:
Heparin 3,000 units IV; Ancef 2 g IV antibiotic was administered
within 1 hour of the procedure.

ANESTHESIA/SEDATION:
General anesthesia.

CONTRAST:  Omnipaque 300 approximately 400 mL.

FLUOROSCOPY TIME:  Fluoroscopy Time: 48 minutes 24 seconds (1534
mGy).

COMPLICATIONS:
None immediate.
The right groin was prepped and draped in the usual sterile fashion.
Thereafter using modified Seldinger technique, transfemoral access
into the right common femoral artery was obtained without
difficulty. Over a 0.035 inch guidewire, a 5 French Pinnacle sheath
was inserted. Through this, and also over 0.035 inch guidewire, a 5
French JB 1 catheter was advanced to the aortic arch region and
selectively positioned in the right common carotid artery, the right
external carotid artery, the right vertebral artery, the left common
carotid artery and the left vertebral artery.

COMPLETE SPINAL ARTERIOGRAM.

Yuchen Vasques 5 French diagnostic catheter was then used for selective
arteriograms of the thoracic intercostal, and lumbar arteries.

The following vessels were super selectively engaged for
arteriograms at T3 on the right, and from T4 to L4 bilaterally.

Common iliac arteriograms were also performed bilaterally.
FINDINGS: The right common carotid arteriogram demonstrates the right external
carotid artery and its major branches to be widely patent.

The right internal carotid artery at the bulb to the cranial skull
base is widely patent with mild fusiform dilatation at the level of
C1 C2.

The petrous, cavernous and the supraclinoid segments are widely
patent.

The right middle cerebral artery and the right anterior cerebral
artery opacify into the capillary and venous phases.

The right vertebral artery origin is widely patent.

The vessel is seen to opacify with mild tortuosity distally to the
cranial skull base. Patency is seen of the right vertebrobasilar
junction and the right posterior-inferior cerebellar artery. The
right posteroinferior cerebellar artery demonstrates mild focal
areas of caliber irregularity proximally, and moderately to
moderately severe involving the distal left PICA distribution
suggestive of intracranial arteriosclerosis. The basilar artery, the
posterior cerebral arteries, the superior cerebellar arteries and
the anterior-inferior cerebellar arteries opacify into the capillary
and venous phases. Mild narrowing is seen of the left posterior
cerebral artery proximally, and moderately of the P2 segment.

Retrograde opacification the left vertebrobasilar junction is seen
from the right vertebral arteriogram.

The right subclavian arteriogram demonstrates the ascending cervical
branch of the thyrocervical trunk to be widely patent.

Right internal mammary artery is also seen to opacify to the
paramedian area without evidence of arteriovenous shunting.

The left common carotid arteriogram demonstrates the left external
carotid artery and its major branches to be widely patent.

The left internal carotid artery at the bulb to the cranial skull
base is widely patent.

The petrous, cavernous and the supraclinoid segment is seen to be
patent.

A small infundibulum is seen at the origin of the left posterior
communicating artery.

The left middle cerebral artery and the left anterior cerebral
artery opacify into the capillary and venous phases.

Mild caliber irregularity is seen of the left anterior cerebral
artery A3 segment. Left vertebral artery origin is widely patent.

The vessel is seen to opacify to the cranial skull base. Patency is
seen of the left vertebrobasilar junction and the left
posterior-inferior cerebellar artery. The opacified portions of the
basilar artery, the posterior cerebral arteries, the superior
cerebellar arteries and the anterior-inferior cerebellar arteries
demonstrate normal patency.

The left subclavian arteriogram demonstrates opacification of the
ascending cervical branch of the thyrocervical trunk, and the
internal mammary artery on the left to be normally patent.

Super selective arteriograms were performed at T3 intercostal artery
on the right, and intercostal arteries at T4 bilaterally, T5
bilaterally, T6 bilaterally, T7 bilaterally, T8 bilaterally, T9
bilaterally, T10 bilaterally, T11 bilaterally, T12 bilaterally, and
lumbar 1 bilaterally, lumbar 2 bilaterally, lumbar 3 bilaterally and
lumbar 4 bilaterally demonstrate normal opacification without
evidence of abnormal hypertrophy, or arteriovenous shunting, or
aneurysms, or of arteriovenous malformations. No abnormal
enhancements of the vertebral bodies in the thoracic region are
seen. Also no abnormal early venous drainage with prominence of
venous structures is evident on the arteriograms.

The artery of [place] arises from the left T9 intercostal
artery. There is cranial and caudal linear enhancement in the
midline into the delayed arterial phase.

Bilateral common iliac arteriograms demonstrate no abnormal
pathological arterial feeders or early pathological venous shunting.
IMPRESSION: Intracranially no evidence of dural AV fistula, arteriovenous
shunting, or of aneurysms or dissections noted.

Total spine arteriogram as described above demonstrates no
pathologic enhancement, arterial or venous shunting, or prominence
of the arteriovenous structures in the intra dural, perimeullary or
of the intramedullary compartments.

PLAN:
As per referring MANISH.

## 2022-08-14 ENCOUNTER — Other Ambulatory Visit: Payer: Self-pay | Admitting: Physical Medicine and Rehabilitation

## 2022-08-21 ENCOUNTER — Telehealth: Payer: Self-pay | Admitting: *Deleted

## 2022-08-21 NOTE — Telephone Encounter (Signed)
Gave completed/signed [employer] form back to MR to process for pt.

## 2022-08-25 ENCOUNTER — Emergency Department (HOSPITAL_COMMUNITY): Payer: BC Managed Care – PPO

## 2022-08-25 ENCOUNTER — Encounter (HOSPITAL_COMMUNITY): Payer: Self-pay

## 2022-08-25 ENCOUNTER — Inpatient Hospital Stay (HOSPITAL_COMMUNITY)
Admission: EM | Admit: 2022-08-25 | Discharge: 2022-08-29 | DRG: 682 | Disposition: A | Discharge status: Rehabilitation Facility | Payer: BC Managed Care – PPO | Attending: Internal Medicine | Admitting: Internal Medicine

## 2022-08-25 ENCOUNTER — Other Ambulatory Visit: Payer: Self-pay

## 2022-08-25 DIAGNOSIS — Z993 Dependence on wheelchair: Secondary | ICD-10-CM

## 2022-08-25 DIAGNOSIS — Z87828 Personal history of other (healed) physical injury and trauma: Secondary | ICD-10-CM

## 2022-08-25 DIAGNOSIS — K592 Neurogenic bowel, not elsewhere classified: Secondary | ICD-10-CM | POA: Diagnosis present

## 2022-08-25 DIAGNOSIS — G373 Acute transverse myelitis in demyelinating disease of central nervous system: Secondary | ICD-10-CM | POA: Diagnosis present

## 2022-08-25 DIAGNOSIS — D631 Anemia in chronic kidney disease: Secondary | ICD-10-CM | POA: Diagnosis present

## 2022-08-25 DIAGNOSIS — R29898 Other symptoms and signs involving the musculoskeletal system: Secondary | ICD-10-CM

## 2022-08-25 DIAGNOSIS — N319 Neuromuscular dysfunction of bladder, unspecified: Secondary | ICD-10-CM | POA: Diagnosis present

## 2022-08-25 DIAGNOSIS — N3289 Other specified disorders of bladder: Secondary | ICD-10-CM | POA: Diagnosis present

## 2022-08-25 DIAGNOSIS — N179 Acute kidney failure, unspecified: Secondary | ICD-10-CM | POA: Diagnosis not present

## 2022-08-25 DIAGNOSIS — F419 Anxiety disorder, unspecified: Secondary | ICD-10-CM | POA: Diagnosis present

## 2022-08-25 DIAGNOSIS — D649 Anemia, unspecified: Secondary | ICD-10-CM | POA: Insufficient documentation

## 2022-08-25 DIAGNOSIS — E785 Hyperlipidemia, unspecified: Secondary | ICD-10-CM | POA: Diagnosis present

## 2022-08-25 DIAGNOSIS — G8220 Paraplegia, unspecified: Secondary | ICD-10-CM | POA: Diagnosis present

## 2022-08-25 DIAGNOSIS — R32 Unspecified urinary incontinence: Secondary | ICD-10-CM | POA: Diagnosis present

## 2022-08-25 DIAGNOSIS — J90 Pleural effusion, not elsewhere classified: Secondary | ICD-10-CM | POA: Diagnosis present

## 2022-08-25 DIAGNOSIS — I10 Essential (primary) hypertension: Secondary | ICD-10-CM | POA: Insufficient documentation

## 2022-08-25 DIAGNOSIS — M109 Gout, unspecified: Secondary | ICD-10-CM | POA: Diagnosis present

## 2022-08-25 DIAGNOSIS — Z79899 Other long term (current) drug therapy: Secondary | ICD-10-CM

## 2022-08-25 DIAGNOSIS — R5381 Other malaise: Secondary | ICD-10-CM | POA: Diagnosis present

## 2022-08-25 DIAGNOSIS — R159 Full incontinence of feces: Secondary | ICD-10-CM | POA: Diagnosis present

## 2022-08-25 DIAGNOSIS — N184 Chronic kidney disease, stage 4 (severe): Secondary | ICD-10-CM | POA: Diagnosis present

## 2022-08-25 DIAGNOSIS — I129 Hypertensive chronic kidney disease with stage 1 through stage 4 chronic kidney disease, or unspecified chronic kidney disease: Secondary | ICD-10-CM | POA: Diagnosis present

## 2022-08-25 DIAGNOSIS — Z1152 Encounter for screening for COVID-19: Secondary | ICD-10-CM

## 2022-08-25 DIAGNOSIS — F32A Depression, unspecified: Secondary | ICD-10-CM | POA: Diagnosis present

## 2022-08-25 DIAGNOSIS — M79605 Pain in left leg: Secondary | ICD-10-CM | POA: Insufficient documentation

## 2022-08-25 DIAGNOSIS — J189 Pneumonia, unspecified organism: Secondary | ICD-10-CM | POA: Diagnosis present

## 2022-08-25 DIAGNOSIS — K219 Gastro-esophageal reflux disease without esophagitis: Secondary | ICD-10-CM | POA: Diagnosis present

## 2022-08-25 DIAGNOSIS — R197 Diarrhea, unspecified: Secondary | ICD-10-CM | POA: Diagnosis present

## 2022-08-25 LAB — BASIC METABOLIC PANEL
Anion gap: 13 (ref 5–15)
BUN: 32 mg/dL — ABNORMAL HIGH (ref 6–20)
CO2: 24 mmol/L (ref 22–32)
Calcium: 9.4 mg/dL (ref 8.9–10.3)
Chloride: 99 mmol/L (ref 98–111)
Creatinine, Ser: 2.75 mg/dL — ABNORMAL HIGH (ref 0.61–1.24)
GFR, Estimated: 27 mL/min — ABNORMAL LOW (ref >60)
Glucose, Bld: 103 mg/dL — ABNORMAL HIGH (ref 70–99)
Potassium: 5.1 mmol/L (ref 3.5–5.1)
Sodium: 136 mmol/L (ref 135–145)

## 2022-08-25 LAB — URINALYSIS, ROUTINE W REFLEX MICROSCOPIC
Bilirubin Urine: NEGATIVE
Glucose, UA: NEGATIVE mg/dL
Hgb urine dipstick: NEGATIVE
Ketones, ur: NEGATIVE mg/dL
Nitrite: NEGATIVE
Protein, ur: NEGATIVE mg/dL
Specific Gravity, Urine: 1.008 (ref 1.005–1.030)
pH: 5 (ref 5.0–8.0)

## 2022-08-25 LAB — CBC WITH DIFFERENTIAL/PLATELET
Abs Immature Granulocytes: 0.05 10*3/uL (ref 0.00–0.07)
Basophils Absolute: 0 10*3/uL (ref 0.0–0.1)
Basophils Relative: 0 %
Eosinophils Absolute: 0.4 10*3/uL (ref 0.0–0.5)
Eosinophils Relative: 4 %
HCT: 35.1 % — ABNORMAL LOW (ref 39.0–52.0)
Hemoglobin: 11.5 g/dL — ABNORMAL LOW (ref 13.0–17.0)
Immature Granulocytes: 1 %
Lymphocytes Relative: 21 %
Lymphs Abs: 2.1 10*3/uL (ref 0.7–4.0)
MCH: 27.8 pg (ref 26.0–34.0)
MCHC: 32.8 g/dL (ref 30.0–36.0)
MCV: 85 fL (ref 80.0–100.0)
Monocytes Absolute: 1 10*3/uL (ref 0.1–1.0)
Monocytes Relative: 11 %
Neutro Abs: 6.4 10*3/uL (ref 1.7–7.7)
Neutrophils Relative %: 63 %
Platelets: 337 10*3/uL (ref 150–400)
RBC: 4.13 MIL/uL — ABNORMAL LOW (ref 4.22–5.81)
RDW: 13.3 % (ref 11.5–15.5)
WBC: 10 10*3/uL (ref 4.0–10.5)
nRBC: 0 % (ref 0.0–0.2)

## 2022-08-25 LAB — SEDIMENTATION RATE: Sed Rate: 98 mm/hr — ABNORMAL HIGH (ref 0–16)

## 2022-08-25 LAB — C-REACTIVE PROTEIN: CRP: 9.5 mg/dL — ABNORMAL HIGH (ref <1.0)

## 2022-08-25 MED ORDER — LORAZEPAM 2 MG/ML IJ SOLN
1.0000 mg | Freq: Once | INTRAMUSCULAR | Status: AC
  Administered 2022-08-25: 1 mg via INTRAVENOUS
  Filled 2022-08-25: qty 1

## 2022-08-25 MED ORDER — GADOBUTROL 1 MMOL/ML IV SOLN
7.5000 mL | Freq: Once | INTRAVENOUS | Status: AC | PRN
  Administered 2022-08-25: 7.5 mL via INTRAVENOUS

## 2022-08-25 MED ORDER — SODIUM CHLORIDE 0.9 % IV BOLUS
1000.0000 mL | Freq: Once | INTRAVENOUS | Status: AC
  Administered 2022-08-25: 1000 mL via INTRAVENOUS

## 2022-08-25 NOTE — H&P (Incomplete)
History and Physical    Maurice Cruz Maurice Cruz DOB: 02/21/1966 DOA: 08/25/2022  PCP: Glendon Axe, MD  Patient coming from: Home  Chief Complaint: Left leg weakness  HPI: Maurice Cruz is a 56 y.o. male with medical history significant of transverse myelitis first diagnosed in 2020 and followed by Dr. [name] with neurology as outpatient since 2021.  He has completed extensive rehab with documented deficits including bilateral lower extremity weakness, neurogenic bladder does self-catheterization, neurogenic bowel, and use of walking cane or walker as needed.  Also has history of anxiety, asthma, depression, GERD, hypertension. He presents to the ED complaining of worsening left leg weakness x 2 days.  Labs showing hemoglobin 11.5, MCV 85.0, BUN 32, creatinine 2.7, ESR 98, CRP 9.5.  MRI of cervical and thoracic spine w wo contrast showing: "IMPRESSION: 1. 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 No abnormal enhancement or increased T2 hyperintense signal on the current study; abnormal signal in the adjacent cord on the prior exam could have reflected edema related to the hemorrhage. 2. Diffusely decreased marrow signal, which is nonspecific but can be seen in the setting of anemia, obesity or smoking. 3. Small right-greater-than-left pleural effusions. 4. No spinal canal stenosis or neural foraminal narrowing in the cervical or thoracic spine." "ADDENDUM: Additional comparison is made to MRI thoracic spine 03/14/2021. The T1 and T2 hypointense signal in the spinal cord posterior to T9 appears unchanged compared to 2022."  MRI of lumbar spine w wo contrast showing: "IMPRESSION: No spinal canal stenosis or neural foraminal narrowing. No abnormal enhancement. No evidence of cauda  equina syndrome."  Patient complained of left lower leg pain.  Venous Doppler ordered by ED provider to rule out DVT and currently pending.  Neurosurgery reviewed the patient's MRI scans and felt that there is no indication for acute neurosurgical intervention.  Felt that the changes look chronic but cannot rule out repeat hemorrhage.  Neurology consulted (Dr. Lorrin Goodell).  Patient was given Ativan and 1 L normal saline bolus in the ED.  TRH called to admit.  ED Course: ***  Review of Systems:  ROS  Past Medical History:  Diagnosis Date  . Anxiety   . Asthma    as a child  . Depression   . ED (erectile dysfunction)   . GERD (gastroesophageal reflux disease)   . Hx of spinal cord injury 03/2020  . Hypertension   . Neurogenic bladder    self caths  . Neurogenic bowel    has to be digitially stimulated - 04/24/21  . 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 Transverse myelitis [facility])     Past Surgical History:  Procedure Laterality Date  . Anorectal biospy  02/09/2021  . Colon Biospy  02/09/2021  . IR ANGIO INTRA EXTRACRAN SEL COM CAROTID INNOMINATE BILAT MOD SED  04/25/2021  . IR ANGIO VERTEBRAL SEL SUBCLAVIAN INNOMINATE UNI L MOD SED  04/25/2021  . IR ANGIO VERTEBRAL SEL VERTEBRAL UNI R MOD SED  04/25/2021  . IR ANGIO/SPINAL LEFT  04/25/2021  . IR ANGIO/SPINAL LEFT  04/25/2021  . IR ANGIO/SPINAL LEFT  04/25/2021  . IR ANGIO/SPINAL LEFT  04/25/2021  . IR ANGIO/SPINAL LEFT  04/25/2021  . IR ANGIO/SPINAL LEFT  04/25/2021  . IR ANGIO/SPINAL LEFT  04/25/2021  . IR ANGIO/SPINAL  LEFT  04/25/2021  . IR ANGIO/SPINAL LEFT  04/25/2021  . IR ANGIO/SPINAL LEFT  04/25/2021  . IR ANGIO/SPINAL LEFT  04/25/2021  . IR ANGIO/SPINAL LEFT  04/25/2021  . IR ANGIO/SPINAL LEFT  04/25/2021  . IR ANGIO/SPINAL RIGHT  04/25/2021  . IR ANGIO/SPINAL RIGHT  04/25/2021  . IR ANGIO/SPINAL RIGHT  04/25/2021  . IR ANGIO/SPINAL RIGHT  04/25/2021  . IR ANGIO/SPINAL RIGHT  04/25/2021  . IR ANGIO/SPINAL RIGHT  04/25/2021  . IR  ANGIO/SPINAL RIGHT  04/25/2021  . IR ANGIO/SPINAL RIGHT  04/25/2021  . IR ANGIO/SPINAL RIGHT  04/25/2021  . IR ANGIO/SPINAL RIGHT  04/25/2021  . IR ANGIO/SPINAL RIGHT  04/25/2021  . IR ANGIO/SPINAL RIGHT  04/25/2021  . IR ANGIO/SPINAL RIGHT  04/25/2021  . IR ANGIO/SPINAL RIGHT  04/25/2021  . IR ANGIOGRAM EXTREMITY BILATERAL  04/25/2021  . IR RADIOLOGIST EVAL & MGMT  03/21/2021  . RADIOLOGY WITH ANESTHESIA N/A 04/25/2021   Procedure: IR WITH ANESTHESIA SPINAL ANGIOGRAM;  Surgeon: Luanne Bras, MD;  Location: [facility];  Service: Radiology;  Laterality: N/A;     reports that he has never smoked. He has never used smokeless tobacco. He reports that he does not currently use alcohol. He reports that he does not use drugs.  No Known Allergies  Family History  Problem Relation Age of Onset  . Cancer Maternal Grandmother     Prior to Admission medications   Medication Sig Start Date End Date Taking? Authorizing Provider  amLODipine (NORVASC) 5 MG tablet Take 5 mg by mouth daily.    [provider]  diazepam (VALIUM) 2 MG tablet TAKE 1 TABLET BY MOUTH IN THE MORNING AND AT BEDTIME 08/15/22   Lovorn, Jinny Blossom, MD  docusate sodium (ENEMEEZ) 283 MG enema Place 1 enema (283 mg total) rectally daily. For bowel program- for paraplegia 10/26/21   Lovorn, Jinny Blossom, MD  FLUoxetine (PROZAC) 10 MG capsule Take 1 capsule (10 mg total) by mouth daily. For mood 04/09/21   Lovorn, Jinny Blossom, MD  labetalol (NORMODYNE) 100 MG tablet Take 100 mg by mouth 2 (two) times daily.    [provider]  lactulose (CHRONULAC) 10 GM/15ML solution Take 20 g by mouth daily.    [provider]  losartan (COZAAR) 50 MG tablet Take 50 mg by mouth daily.    [provider]  Magnesium 250 MG TABS Take 400 mg by mouth daily.    [provider]  magnesium hydroxide (DULCOLAX) 400 MG/5ML suspension Take by mouth daily as needed for mild constipation.    [provider]  Multiple  Vitamins-Minerals (CENTRUM MEN PO) Take 1 tablet by mouth daily.    [provider]  pantoprazole (PROTONIX) 40 MG tablet Take 40 mg by mouth daily.    [provider]  polycarbophil (FIBERCON) 625 MG tablet Take 1,250 mg by mouth daily.    [provider]  rosuvastatin (CRESTOR) 5 MG tablet Take 5 mg by mouth daily.    [provider]  senna (SENOKOT) 8.6 MG TABS tablet Take 2 tablets by mouth daily.    [provider]  sildenafil (VIAGRA) 100 MG tablet Take 100 mg by mouth See admin instructions. Take 100 mg every 3 days as needed for erectile dysfunction 02/15/21   [provider]  tiZANidine (ZANAFLEX) 4 MG tablet TAKE 1/2 TO 1 (ONE-HALF TO ONE) TABLET BY MOUTH TWICE DAILY 02/18/22   Lovorn, Jinny Blossom, MD  traMADol (ULTRAM) 50 MG tablet Take 1 tablet (50 mg total) by  mouth every 6 (six) hours as needed. 02/04/22   Lovorn, Jinny Blossom, MD  triamterene-hydrochlorothiazide (MAXZIDE-25) 37.5-25 MG tablet Take 1 tablet by mouth daily.    [provider]    Physical Exam: Vitals:   08/25/22 1600 08/25/22 1700 08/25/22 2101 08/25/22 2230  BP: 119/72 114/69 118/83 124/70  Pulse: (!) 58 (!) 55 64 68  Resp: _0 Temp:   99.1 F (37.3 C)   TempSrc:   Oral   SpO2: 100% 100% 99% 100%    Physical Exam  Labs on Admission: I have personally reviewed following labs and imaging studies  CBC: Recent Labs  Lab 08/25/22 1625  WBC 10.0  NEUTROABS 6.4  HGB 11.5*  HCT 35.1*  MCV 85.0  PLT 297   Basic Metabolic Panel: Recent Labs  Lab 08/25/22 1625  NA 136  K 5.1  CL 99  CO2 24  GLUCOSE 103*  BUN 32*  CREATININE 2.75*  CALCIUM 9.4   GFR: CrCl cannot be calculated (Unknown ideal weight.). Liver Function Tests: No results for input(s): "AST", "ALT", "ALKPHOS", "BILITOT", "PROT", "ALBUMIN" in the last 168 hours. No results for input(s): "LIPASE", "AMYLASE" in the last 168 hours. No results for input(s): "AMMONIA" in the last 168  hours. Coagulation Profile: No results for input(s): "INR", "PROTIME" in the last 168 hours. Cardiac Enzymes: No results for input(s): "CKTOTAL", "CKMB", "CKMBINDEX", "TROPONINI" in the last 168 hours. BNP (last 3 results) No results for input(s): "PROBNP" in the last 8760 hours. HbA1C: No results for input(s): "HGBA1C" in the last 72 hours. CBG: No results for input(s): "GLUCAP" in the last 168 hours. Lipid Profile: No results for input(s): "CHOL", "HDL", "LDLCALC", "TRIG", "CHOLHDL", "LDLDIRECT" in the last 72 hours. Thyroid Function Tests: No results for input(s): "TSH", "T4TOTAL", "FREET4", "T3FREE", "THYROIDAB" in the last 72 hours. Anemia Panel: No results for input(s): "VITAMINB12", "FOLATE", "FERRITIN", "TIBC", "IRON", "RETICCTPCT" in the last 72 hours. Urine analysis:    Component Value Date/Time   COLORURINE YELLOW 08/25/2022 2118   APPEARANCEUR CLEAR 08/25/2022 2118   LABSPEC 1.008 08/25/2022 2118   PHURINE 5.0 08/25/2022 2118   GLUCOSEU NEGATIVE 08/25/2022 2118   HGBUR NEGATIVE 08/25/2022 2118   BILIRUBINUR NEGATIVE 08/25/2022 2118   KETONESUR NEGATIVE 08/25/2022 2118   PROTEINUR NEGATIVE 08/25/2022 2118   NITRITE NEGATIVE 08/25/2022 2118   LEUKOCYTESUR TRACE (A) 08/25/2022 2118    Radiological Exams on Admission: MR Cervical Spine W or Wo Contrast  Addendum Date: 08/25/2022   ADDENDUM REPORT: 08/25/2022 23:30 ADDENDUM: Additional comparison is made to MRI thoracic spine 03/14/2021. The T1 and T2 hypointense signal in the spinal cord posterior to T9 appears unchanged compared to 2022. These findings were discussed by telephone on 08/25/2022 at 11:19 pm with provider Khaliqdina. Electronically Signed   By: Merilyn Baba M.D.   On: 08/25/2022 23:30   Result Date: 08/25/2022 CLINICAL DATA:  Ataxia, cervical or thoracic pathology suspected EXAM: MRI CERVICAL AND THORACIC SPINE WITHOUT AND WITH CONTRAST TECHNIQUE: Multiplanar and multiecho pulse sequences of the  cervical spine, to include the craniocervical junction and cervicothoracic junction, and the thoracic spine, were obtained without and with intravenous contrast. CONTRAST:  7.64m GADAVIST GADOBUTROL 1 MMOL/ML IV SOLN COMPARISON:  MRI cervical and thoracic spine 04/15/2020 FINDINGS: MRI CERVICAL SPINE FINDINGS Alignment: Straightening of the normal cervical lordosis. Mild dextrocurvature. No listhesis. Vertebrae: No fracture, evidence of discitis, or bone lesion. Diffusely decreased marrow signal. No abnormal enhancement. Cord: Normal signal and morphology.  No abnormal enhancement. Posterior  Fossa, vertebral arteries, paraspinal tissues: Negative. Disc levels: Preserved disc hydration without significant disc bulge. No spinal canal stenosis or neural foraminal narrowing in the cervical spine. MRI THORACIC SPINE FINDINGS Alignment: S shaped curvature of the thoracolumbar spine. No significant listhesis. Vertebrae: No fracture, evidence of discitis, or bone lesion. No abnormal enhancement. Cord: The cord is normal in morphology. On susceptibility weighted imaging, there is hypointense signal in the anterior spinal cord at T8-T9 to T9-T10 (series 14, images 25-30). This is associated with hypointense signal on T1 and T2 weighted sequences, most likely chronic hemorrhage, which correlates with an area of enhancement on the prior MRI. No abnormal enhancement or increased T2 hyperintense signal on the current study. Paraspinal and other soft tissues: Small right-greater-than-left pleural effusions. Disc levels: No significant spinal canal stenosis or neural foraminal narrowing. IMPRESSION: 1. 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 No abnormal enhancement or increased T2 hyperintense signal on the current study; abnormal  signal in the adjacent cord on the prior exam could have reflected edema related to the hemorrhage. 2. Diffusely decreased marrow signal, which is nonspecific but can be seen in the setting of anemia, obesity or smoking. 3. Small right-greater-than-left pleural effusions. 4. No spinal canal stenosis or neural foraminal narrowing in the cervical or thoracic spine. Electronically Signed: By: Merilyn Baba M.D. On: 08/25/2022 21:23   MR THORACIC SPINE W WO CONTRAST  Addendum Date: 08/25/2022   ADDENDUM REPORT: 08/25/2022 23:30 ADDENDUM: Additional comparison is made to MRI thoracic spine 03/14/2021. The T1 and T2 hypointense signal in the spinal cord posterior to T9 appears unchanged compared to 2022. These findings were discussed by telephone on 08/25/2022 at 11:19 pm with provider Khaliqdina. Electronically Signed   By: Merilyn Baba M.D.   On: 08/25/2022 23:30   Result Date: 08/25/2022 CLINICAL DATA:  Ataxia, cervical or thoracic pathology suspected EXAM: MRI CERVICAL AND THORACIC SPINE WITHOUT AND WITH CONTRAST TECHNIQUE: Multiplanar and multiecho pulse sequences of the cervical spine, to include the craniocervical junction and cervicothoracic junction, and the thoracic spine, were obtained without and with intravenous contrast. CONTRAST:  7.67m GADAVIST GADOBUTROL 1 MMOL/ML IV SOLN COMPARISON:  MRI cervical and thoracic spine 04/15/2020 FINDINGS: MRI CERVICAL SPINE FINDINGS Alignment: Straightening of the normal cervical lordosis. Mild dextrocurvature. No listhesis. Vertebrae: No fracture, evidence of discitis, or bone lesion. Diffusely decreased marrow signal. No abnormal enhancement. Cord: Normal signal and morphology.  No abnormal enhancement. Posterior Fossa, vertebral arteries, paraspinal tissues: Negative. Disc levels: Preserved disc hydration without significant disc bulge. No spinal canal stenosis or neural foraminal narrowing in the cervical spine. MRI THORACIC SPINE FINDINGS Alignment: S shaped  curvature of the thoracolumbar spine. No significant listhesis. Vertebrae: No fracture, evidence of discitis, or bone lesion. No abnormal enhancement. Cord: The cord is normal in morphology. On susceptibility weighted imaging, there is hypointense signal in the anterior spinal cord at T8-T9 to T9-T10 (series 14, images 25-30). This is associated with hypointense signal on T1 and T2 weighted sequences, most likely chronic hemorrhage, which correlates with an area of enhancement on the prior MRI. No abnormal enhancement or increased T2 hyperintense signal on the current study. Paraspinal and other soft tissues: Small right-greater-than-left pleural effusions. Disc levels: No significant spinal canal stenosis or neural foraminal narrowing. IMPRESSION: 1. Findings concerning for chronic hemorrhage in  the anterior spinal cord from T8-T9 to T9-T10, which correlates with an area of enhancement and hypointense signal on the 2021 MRI, which was concerning at the time for a small amount of blood. No abnormal enhancement or increased T2 hyperintense signal on the current study; abnormal signal in the adjacent cord on the prior exam could have reflected edema related to the hemorrhage. 2. Diffusely decreased marrow signal, which is nonspecific but can be seen in the setting of anemia, obesity or smoking. 3. Small right-greater-than-left pleural effusions. 4. No spinal canal stenosis or neural foraminal narrowing in the cervical or thoracic spine. Electronically Signed: By: Merilyn Baba M.D. On: 08/25/2022 21:23   MR Lumbar Spine W Wo Contrast  Result Date: 08/25/2022 CLINICAL DATA:  Low back pain, cauda equina syndrome suspected EXAM: MRI LUMBAR SPINE WITHOUT AND WITH CONTRAST TECHNIQUE: Multiplanar and multiecho pulse sequences of the lumbar spine were obtained without and with intravenous contrast. CONTRAST:  7.11m GADAVIST GADOBUTROL 1 MMOL/ML IV SOLN COMPARISON:  MRI lumbar spine 04/15/2020 FINDINGS: Segmentation: 5  lumbar type vertebral bodies. Partial sacralization of S1, with the last fully formed disc space at S1-S2. Alignment:  Mild dextrocurvature.  No listhesis. Vertebrae: No fracture, evidence of discitis, or bone lesion. No abnormal enhancement. Conus medullaris and cauda equina: Conus extends to the L1-L2 level. Conus and cauda equina appear normal. No abnormal enhancement. Paraspinal and other soft tissues: Small renal cysts, for which no follow-up is indicated. Normal muscle bulk and signal. Disc levels: T12-L1: No significant disc bulge. No spinal canal stenosis or neural foraminal narrowing. L1-L2: No significant disc bulge. No spinal canal stenosis or neural foraminal narrowing. L2-L3: No significant disc bulge. No spinal canal stenosis or neural foraminal narrowing. L3-L4: No significant disc bulge. No spinal canal stenosis or neural foraminal narrowing. L4-L5: No significant disc bulge. No spinal canal stenosis or neural foraminal narrowing. L5-S1: Minimal disc bulge. Mild facet arthropathy. No spinal canal stenosis or neural foraminal narrowing. IMPRESSION: No spinal canal stenosis or neural foraminal narrowing. No abnormal enhancement. No evidence of cauda equina syndrome. Electronically Signed   By: AMerilyn BabaM.D.   On: 08/25/2022 20:29    Assessment and Plan  Left leg weakness -Patient with history of transverse myelitis first diagnosed in 2020 and followed by Dr. SFelecia Shellingwith neurology as outpatient since 2021.  He has completed extensive rehab with documented deficits including bilateral lower extremity weakness, neurogenic bladder does self-catheterization, neurogenic bowel, and use of walking cane or walker as needed.   -He presents with worsening left leg weakness x 2 days.  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 No abnormal enhancement or increased T2 hyperintense signal on the current study; abnormal signal in the adjacent cord on the prior exam could have reflected edema related to the hemorrhage. Diffusely decreased marrow signal, which is nonspecific but can be seen in the setting of anemia, obesity or smoking. No spinal canal stenosis or neural foraminal narrowing in the cervical or thoracic spine. Additional comparison is made to MRI thoracic spine 03/14/2021. The T1 and T2 hypointense signal in the spinal cord posterior to T9 appears unchanged compared to 2022. -MRI of lumbar spine w wo contrast read by radiologist as: No spinal canal  stenosis or neural foraminal narrowing. No abnormal enhancement. No evidence of cauda equina syndrome. -Neurosurgery reviewed the patient's MRI scans and felt that there is no indication for acute neurosurgical intervention.  Felt that the changes look chronic but cannot rule out repeat hemorrhage.  Neurology has been consulted.  AKI Creatinine 2.7, was 1.2 in July 2022. Pre-renal etiology? -  Left lower leg pain -Venous Doppler ordered by ED provider to rule out DVT and currently pending.     Also has history of anxiety, asthma, depression, GERD, hypertension. He presents to the ED complaining of worsening left leg weakness x 2 days.  Labs showing hemoglobin 11.5, MCV 85.0, BUN 32, creatinine 2.7, ESR 98, CRP 9.5.  2. . 3. Small right-greater-than-left pleural effusions. 4. " "ADDENDUM: "      (Dr. Lorrin Goodell).  Patient was given Ativan and 1 L normal saline bolus in the ED.  TRH called to admit.  DVT prophylaxis: {Blank single:19197::"Lovenox","SQ Heparin","IV heparin gtt","Xarelto","Eliquis","Coumadin","SCDs","***"} Code Status: {Blank single:19197::"Full Code","DNR","DNR/DNI","Comfort Care","***"} Family Communication: ***  Consults called: ***  Level of  care: {Blank single:19197::"Med-Surg","Telemetry bed","Progressive Care Unit","Step Down Unit"} Admission status: ***  Shela Leff MD [facility]  If 7PM-7AM, please contact night-coverage [email or web address]  08/25/2022, 11:40 PM

## 2022-08-25 NOTE — ED Provider Notes (Signed)
[facility] Provider Note   CSN: [id number] Arrival date & time: 08/25/22  1202     History  No chief complaint on file.   Maurice Cruz is a 56 y.o. male.  Patient with history of transverse myelitis presents today with complaints of leg weakness.  Patient was diagnosed with transverse myelitis in 2021 and had extensive rehab and physical therapy for his lower leg weakness.  States that when he was diagnosed it was his right leg that was weaker. States that for the past several months he has gotten to where he can ambulate with a walker. States that over the past 2 days he has had progressive weakness in his left lower leg similar to symptoms he had predominantly in his right lower leg 2 years ago. States that he is now fully wheelchair bound. Also states that he normally has full control of his bowels and bladder, however he is now having to take several rounds of laxatives to use the bathroom and self cath in order to urinate. Patient denies any falls or injury. Denies fevers or chills. States that he does have sensation in his lower extremities however he has noticed some tingling. Endorses some lower back soreness as well. Denies headache, blurred vision, upper extremity weakness, slurred speech, nausea, vomiting, or abdominal pain. Also endorses some mild tenderness to the left lower calf which he just noticed today. He is not anticoagulated.  The history is provided by the patient. No language interpreter was used.       Home Medications Prior to Admission medications   Medication Sig Start Date End Date Taking? Authorizing Provider  amLODipine (NORVASC) 5 MG tablet Take 5 mg by mouth daily.    [provider]  diazepam (VALIUM) 2 MG tablet TAKE 1 TABLET BY MOUTH IN THE MORNING AND AT BEDTIME 08/15/22   Lovorn, Jinny Blossom, MD  docusate sodium (ENEMEEZ) 283 MG enema Place 1 enema (283 mg total) rectally daily. For bowel program- for  paraplegia 10/26/21   Lovorn, Jinny Blossom, MD  FLUoxetine (PROZAC) 10 MG capsule Take 1 capsule (10 mg total) by mouth daily. For mood 04/09/21   Lovorn, Jinny Blossom, MD  labetalol (NORMODYNE) 100 MG tablet Take 100 mg by mouth 2 (two) times daily.    [provider]  lactulose (CHRONULAC) 10 GM/15ML solution Take 20 g by mouth daily.    [provider]  losartan (COZAAR) 50 MG tablet Take 50 mg by mouth daily.    [provider]  Magnesium 250 MG TABS Take 400 mg by mouth daily.    [provider]  magnesium hydroxide (DULCOLAX) 400 MG/5ML suspension Take by mouth daily as needed for mild constipation.    [provider]  Multiple Vitamins-Minerals (CENTRUM MEN PO) Take 1 tablet by mouth daily.    [provider]  pantoprazole (PROTONIX) 40 MG tablet Take 40 mg by mouth daily.    [provider]  polycarbophil (FIBERCON) 625 MG tablet Take 1,250 mg by mouth daily.    [provider]  rosuvastatin (CRESTOR) 5 MG tablet Take 5 mg by mouth daily.    [provider]  senna (SENOKOT) 8.6 MG TABS tablet Take 2 tablets by mouth daily.    [provider]  sildenafil (VIAGRA) 100 MG tablet Take 100 mg by mouth See admin instructions. Take 100 mg every 3 days as needed for erectile dysfunction 02/15/21   [provider]  tiZANidine (ZANAFLEX) 4 MG tablet TAKE  1/2 TO 1 (ONE-HALF TO ONE) TABLET BY MOUTH TWICE DAILY 02/18/22   Lovorn, Jinny Blossom, MD  traMADol (ULTRAM) 50 MG tablet Take 1 tablet (50 mg total) by mouth every 6 (six) hours as needed. 02/04/22   Lovorn, Jinny Blossom, MD  triamterene-hydrochlorothiazide (MAXZIDE-25) 37.5-25 MG tablet Take 1 tablet by mouth daily.    [provider]      Allergies    Patient has no known allergies.    Review of Systems   Review of Systems  Neurological:  Positive for weakness.  All other systems reviewed and are negative.   Physical Exam Updated Vital Signs BP 119/72   Pulse  (!) 58   Temp 98 F (36.7 C) (Oral)   Resp 18   SpO2 100%  Physical Exam Vitals and nursing note reviewed.  Constitutional:      General: He is not in acute distress.    Appearance: Normal appearance. He is normal weight. He is not ill-appearing, toxic-appearing or diaphoretic.  HENT:     Head: Normocephalic and atraumatic.  Eyes:     Extraocular Movements: Extraocular movements intact.     Pupils: Pupils are equal, round, and reactive to light.  Cardiovascular:     Rate and Rhythm: Normal rate.  Pulmonary:     Effort: Pulmonary effort is normal. No respiratory distress.  Abdominal:     General: Abdomen is flat.     Palpations: Abdomen is soft.  Musculoskeletal:        General: Normal range of motion.     Cervical back: Normal range of motion.     Comments: T spine tenderness to palpation, no overlying erythema or warmth. No fluctuance or induration. DP and PT pulses intact and 2+.  Mild medial calf tenderness to palpation in the LLE.   Skin:    General: Skin is warm and dry.     Capillary Refill: Capillary refill takes less than 2 seconds.  Neurological:     General: No focal deficit present.     Mental Status: He is alert and oriented to person, place, and time.     GCS: GCS eye subscore is 4. GCS verbal subscore is 5. GCS motor subscore is 6.     Sensory: Sensation is intact.     Motor: Motor function is intact.     Coordination: Coordination is intact.     Gait: Gait is intact.     Comments: Alert and oriented to self, place, time and event.    Speech is fluent, clear without dysarthria or dysphasia.    Strength 5/5 in upper extremities, 4/5 in bilateral lower extremities Sensation intact in upper extremities, mild bilateral subjective numbness in bilateral lower extremities   CN I not tested  CN II grossly intact visual fields bilaterally. Did not visualize posterior eye.  CN III, IV, VI PERRLA and EOMs intact bilaterally  CN V Intact sensation to sharp and light  touch to the face  CN VII facial movements symmetric  CN VIII not tested  CN IX, X no uvula deviation, symmetric rise of soft palate  CN XI 5/5 SCM and trapezius strength bilaterally  CN XII Midline tongue protrusion, symmetric L/R movements   Psychiatric:        Mood and Affect: Mood normal.        Behavior: Behavior normal.     ED Results / Procedures / Treatments   Labs (all labs ordered are listed, but only abnormal results are displayed) Labs Reviewed  CBC  WITH DIFFERENTIAL/PLATELET - Abnormal; Notable for the following components:      Result Value   RBC 4.13 (*)    Hemoglobin 11.5 (*)    HCT 35.1 (*)    All other components within normal limits  BASIC METABOLIC PANEL - Abnormal; Notable for the following components:   Glucose, Bld 103 (*)    BUN 32 (*)    Creatinine, Ser 2.75 (*)    GFR, Estimated 27 (*)    All other components within normal limits  SEDIMENTATION RATE - Abnormal; Notable for the following components:   Sed Rate 98 (*)    All other components within normal limits  C-REACTIVE PROTEIN - Abnormal; Notable for the following components:   CRP 9.5 (*)    All other components within normal limits  URINALYSIS, ROUTINE W REFLEX MICROSCOPIC    EKG None  Radiology MR Cervical Spine W or Wo Contrast  Result Date: 08/25/2022 CLINICAL DATA:  Ataxia, cervical or thoracic pathology suspected EXAM: MRI CERVICAL AND THORACIC SPINE WITHOUT AND WITH CONTRAST TECHNIQUE: Multiplanar and multiecho pulse sequences of the cervical spine, to include the craniocervical junction and cervicothoracic junction, and the thoracic spine, were obtained without and with intravenous contrast. CONTRAST:  7.22m GADAVIST GADOBUTROL 1 MMOL/ML IV SOLN COMPARISON:  MRI cervical and thoracic spine 04/15/2020 FINDINGS: MRI CERVICAL SPINE FINDINGS Alignment: Straightening of the normal cervical lordosis. Mild dextrocurvature. No listhesis. Vertebrae: No fracture, evidence of discitis, or bone  lesion. Diffusely decreased marrow signal. No abnormal enhancement. Cord: Normal signal and morphology.  No abnormal enhancement. Posterior Fossa, vertebral arteries, paraspinal tissues: Negative. Disc levels: Preserved disc hydration without significant disc bulge. No spinal canal stenosis or neural foraminal narrowing in the cervical spine. MRI THORACIC SPINE FINDINGS Alignment: S shaped curvature of the thoracolumbar spine. No significant listhesis. Vertebrae: No fracture, evidence of discitis, or bone lesion. No abnormal enhancement. Cord: The cord is normal in morphology. On susceptibility weighted imaging, there is hypointense signal in the anterior spinal cord at T8-T9 to T9-T10 (series 14, images 25-30). This is associated with hypointense signal on T1 and T2 weighted sequences, most likely chronic hemorrhage, which correlates with an area of enhancement on the prior MRI. No abnormal enhancement or increased T2 hyperintense signal on the current study. Paraspinal and other soft tissues: Small right-greater-than-left pleural effusions. Disc levels: No significant spinal canal stenosis or neural foraminal narrowing. IMPRESSION: 1. 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 No abnormal enhancement or increased T2 hyperintense signal on the current study; abnormal signal in the adjacent cord on the prior exam could have reflected edema related to the hemorrhage. 2. Diffusely decreased marrow signal, which is nonspecific but can be seen in the setting of anemia, obesity or smoking. 3. Small right-greater-than-left pleural effusions. 4. No spinal canal stenosis or neural foraminal narrowing in the cervical or thoracic spine. Electronically Signed   By: AMerilyn BabaM.D.   On: 08/25/2022 21:23   MR THORACIC SPINE W WO  CONTRAST  Result Date: 08/25/2022 CLINICAL DATA:  Ataxia, cervical or thoracic pathology suspected EXAM: MRI CERVICAL AND THORACIC SPINE WITHOUT AND WITH CONTRAST TECHNIQUE: Multiplanar and multiecho pulse sequences of the cervical spine, to include the craniocervical junction and cervicothoracic junction, and the thoracic spine, were obtained without and with intravenous contrast. CONTRAST:  7.566m  GADAVIST GADOBUTROL 1 MMOL/ML IV SOLN COMPARISON:  MRI cervical and thoracic spine 04/15/2020 FINDINGS: MRI CERVICAL SPINE FINDINGS Alignment: Straightening of the normal cervical lordosis. Mild dextrocurvature. No listhesis. Vertebrae: No fracture, evidence of discitis, or bone lesion. Diffusely decreased marrow signal. No abnormal enhancement. Cord: Normal signal and morphology.  No abnormal enhancement. Posterior Fossa, vertebral arteries, paraspinal tissues: Negative. Disc levels: Preserved disc hydration without significant disc bulge. No spinal canal stenosis or neural foraminal narrowing in the cervical spine. MRI THORACIC SPINE FINDINGS Alignment: S shaped curvature of the thoracolumbar spine. No significant listhesis. Vertebrae: No fracture, evidence of discitis, or bone lesion. No abnormal enhancement. Cord: The cord is normal in morphology. On susceptibility weighted imaging, there is hypointense signal in the anterior spinal cord at T8-T9 to T9-T10 (series 14, images 25-30). This is associated with hypointense signal on T1 and T2 weighted sequences, most likely chronic hemorrhage, which correlates with an area of enhancement on the prior MRI. No abnormal enhancement or increased T2 hyperintense signal on the current study. Paraspinal and other soft tissues: Small right-greater-than-left pleural effusions. Disc levels: No significant spinal canal stenosis or neural foraminal narrowing. IMPRESSION: 1. 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 No abnormal enhancement or increased T2 hyperintense signal on the current study; abnormal signal in the adjacent cord on the prior exam could have reflected edema related to the hemorrhage. 2. Diffusely decreased marrow signal, which is nonspecific but can be seen in the setting of anemia, obesity or smoking. 3. Small right-greater-than-left pleural effusions. 4. No spinal canal stenosis or neural foraminal narrowing in the cervical or thoracic spine. Electronically Signed   By: Merilyn Baba M.D.   On: 08/25/2022 21:23   MR Lumbar Spine W Wo Contrast  Result Date: 08/25/2022 CLINICAL DATA:  Low back pain, cauda equina syndrome suspected EXAM: MRI LUMBAR SPINE WITHOUT AND WITH CONTRAST TECHNIQUE: Multiplanar and multiecho pulse sequences of the lumbar spine were obtained without and with intravenous contrast. CONTRAST:  7.83m GADAVIST GADOBUTROL 1 MMOL/ML IV SOLN COMPARISON:  MRI lumbar spine 04/15/2020 FINDINGS: Segmentation: 5 lumbar type vertebral bodies. Partial sacralization of S1, with the last fully formed disc space at S1-S2. Alignment:  Mild dextrocurvature.  No listhesis. Vertebrae: No fracture, evidence of discitis, or bone lesion. No abnormal enhancement. Conus medullaris and cauda equina: Conus extends to the L1-L2 level. Conus and cauda equina appear normal. No abnormal enhancement. Paraspinal and other soft tissues: Small renal cysts, for which no follow-up is indicated. Normal muscle bulk and signal. Disc levels: T12-L1: No significant disc bulge. No spinal canal stenosis or neural foraminal narrowing. L1-L2: No significant disc bulge. No spinal canal stenosis or neural foraminal narrowing. L2-L3: No significant disc bulge. No spinal canal stenosis or neural foraminal narrowing. L3-L4: No significant disc bulge. No spinal canal stenosis or neural foraminal narrowing.  L4-L5: No significant disc bulge. No spinal canal stenosis or neural foraminal narrowing. L5-S1: Minimal disc bulge. Mild facet arthropathy. No spinal canal stenosis or neural foraminal narrowing. IMPRESSION: No spinal canal stenosis or neural foraminal narrowing. No abnormal enhancement. No evidence of cauda equina syndrome. Electronically Signed   By: AMerilyn BabaM.D.   On: 08/25/2022 20:29    Procedures Procedures    Medications Ordered in ED Medications - No data to display  ED Course/  Medical Decision Making/ A&P                           Medical Decision Making Amount and/or Complexity of Data Reviewed Labs: ordered. Radiology: ordered.  Risk Prescription drug management.   This patient is a 56 y.o. male who presents to the ED for concern of lower extremity weakness, this involves an extensive number of treatment options, and is a complaint that carries with it a high risk of complications and morbidity. The emergent differential diagnosis prior to evaluation includes, but is not limited to,  Neurologic causes (GBS, CVA, MS, ALS, transverse myelitis, spinal cord injury); ACS, Arrhythmia, syncope, orthostatic hypotension, sepsis, hypoglycemia, electrolyte disturbance, hypothyroidism, respiratory failure, anemia, dehydration, heat injury, polypharmacy, malignancy.   This is not an exhaustive differential.   Past Medical History / Co-morbidities / Social History: Hx transverse myelitis in 2021   Physical Exam: Physical exam performed. The pertinent findings include: bilateral lower extremity weakness  Lab Tests: I ordered, and personally interpreted labs.  The pertinent results include:  no leukocytosis, creatinine 2.75 --> 1.28 a year ago, ESR 98, CRP 9.5   Imaging Studies: I ordered imaging studies including MRI cervical, thoracic, and lumbar spine. I independently visualized and interpreted imaging which showed   1. Findings concerning for chronic hemorrhage in the  anterior spinal cord from T8-T9 to T9-T10, which correlates with an area of enhancement and hypointense signal on the 2021 MRI, which was concerning at the time for a small amount of blood. No abnormal enhancement or increased T2 hyperintense signal on the current study; abnormal signal in the adjacent cord on the prior exam could have reflected edema related to the hemorrhage. 2. Diffusely decreased marrow signal, which is nonspecific but can be seen in the setting of anemia, obesity or smoking. 3. Small right-greater-than-left pleural effusions. 4. No spinal canal stenosis or neural foraminal narrowing in the cervical or thoracic spine.   I agree with the radiologist interpretation.    Medications: I ordered medication including ativan for MRI, fluids for AKI. Reevaluation of the patient after these medicines showed that the patient stayed the same. I have reviewed the patients home medicines and have made adjustments as needed.  Consultations Obtained: I requested consultation with the neurosurgery on call Glenford Peers NP,  and discussed lab and imaging findings as well as pertinent plan - they recommend: they reviewed the MRIs and state that this is not a neurosurgical case, consult neurology. Discussed with neurology Dr. Lorrin Goodell who will see the patient.   Disposition:  Patient presents today with new leg weakness. He is afebrile, non-toxic appearing, and in no acute distress with reassuring vital signs. Physical exam reveals bilateral lower extremity weakness and subjective numbness. Discussed with neurology on call who will see the patient. Patient does have an AKI as well and will require admission. Patient is understanding and amenable with plan.   Discussed patient with hospitalist who agrees to see the patient  Findings and plan of care discussed with supervising physician Dr. Maylon Peppers who is in agreement.    Final Clinical Impression(s) / ED Diagnoses Final  diagnoses:  AKI (acute kidney injury) ([place])  Leg weakness, bilateral    Rx / DC Orders ED Discharge Orders     None         Bud Face, PA-C 08/25/22 2358    Leanord Asal K, DO 08/28/22 1510

## 2022-08-25 NOTE — H&P (Signed)
History and Physical    Maurice Cruz MCE:[id number] DOB: 1966-01-25 DOA: 08/25/2022  PCP: Glendon Axe, MD  Patient coming from: Home  Chief Complaint: Left leg weakness  HPI: Maurice Cruz is a 56 y.o. male with medical history significant of transverse myelitis and followed by Dr. [name] with neurology as outpatient since 2021.  He has completed extensive rehab with documented deficits including bilateral lower extremity weakness, neurogenic bladder does self-catheterization, neurogenic bowel, and use of walking cane or walker as needed.  Also has history of anxiety, depression, GERD, hypertension. He presents to the ED complaining of worsening left leg weakness x 2 days.  Labs showing hemoglobin 11.5, MCV 85.0, BUN 32, creatinine 2.7, ESR 98, CRP 9.5.  MRI of cervical and thoracic spine w wo contrast showing: "IMPRESSION: 1. 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 No abnormal enhancement or increased T2 hyperintense signal on the current study; abnormal signal in the adjacent cord on the prior exam could have reflected edema related to the hemorrhage. 2. Diffusely decreased marrow signal, which is nonspecific but can be seen in the setting of anemia, obesity or smoking. 3. Small right-greater-than-left pleural effusions. 4. No spinal canal stenosis or neural foraminal narrowing in the cervical or thoracic spine." "ADDENDUM: Additional comparison is made to MRI thoracic spine 03/14/2021. The T1 and T2 hypointense signal in the spinal cord posterior to T9 appears unchanged compared to 2022."  MRI of lumbar spine w wo contrast showing: "IMPRESSION: No spinal canal stenosis or neural foraminal narrowing. No abnormal enhancement. No evidence of cauda equina syndrome."  Patient  complained of left lower leg pain.  Venous Doppler ordered by ED provider to rule out DVT and currently pending.  Neurosurgery reviewed the patient's MRI scans and felt that there is no indication for acute neurosurgical intervention.  Felt that the changes look chronic but cannot rule out repeat hemorrhage.  Neurology consulted (Dr. Lorrin Goodell).  Patient was given Ativan and 1 L normal saline bolus in the ED.  [facility] called to admit.  Patient reports history of transverse myelitis and being managed by Dr. [name] with neurology since 2021.  States he has undergone extensive physical therapy and has regained his lower extremity strength to the point where he is able to ambulate using a cane or walker.  However, for the past 2 days he has had worsening weakness in his left leg to the point that now he is not able to walk on his own and has to use a wheelchair.  Denies any numbness in his leg.  He reports chronic bowel and bladder dysfunction and does not think this has changed.  He does self-catheterization at home and takes laxatives for constipation.  He does report history of kidney problems and states he was seen by urology back in September and told that his creatinine was 2.3-2.4.  States urology had ordered a renal ultrasound which was done recently but he has not heard about the results yet.  States his appetite is good and he has not had any vomiting or diarrhea.  No other complaints.  Denies fevers, cough, shortness of breath, or chest pain.  Review of Systems:  Review of Systems  All other systems reviewed and are negative.   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   Transverse  myelitis ([place])     Past Surgical History:  Procedure Laterality Date   Anorectal biospy  02/09/2021   Colon Biospy  02/09/2021   IR ANGIO INTRA EXTRACRAN SEL COM CAROTID INNOMINATE BILAT MOD SED  04/25/2021   IR ANGIO VERTEBRAL SEL SUBCLAVIAN INNOMINATE UNI L MOD SED  04/25/2021   IR ANGIO VERTEBRAL SEL VERTEBRAL UNI R MOD SED  04/25/2021   IR ANGIO/SPINAL LEFT  04/25/2021   IR ANGIO/SPINAL LEFT  04/25/2021   IR ANGIO/SPINAL LEFT  04/25/2021   IR ANGIO/SPINAL LEFT  04/25/2021   IR ANGIO/SPINAL LEFT  04/25/2021   IR ANGIO/SPINAL LEFT  04/25/2021   IR ANGIO/SPINAL LEFT  04/25/2021   IR ANGIO/SPINAL LEFT  04/25/2021   IR ANGIO/SPINAL LEFT  04/25/2021   IR ANGIO/SPINAL LEFT  04/25/2021   IR ANGIO/SPINAL LEFT  04/25/2021   IR ANGIO/SPINAL LEFT  04/25/2021   IR ANGIO/SPINAL LEFT  04/25/2021   IR ANGIO/SPINAL RIGHT  04/25/2021   IR ANGIO/SPINAL RIGHT  04/25/2021   IR ANGIO/SPINAL RIGHT  04/25/2021   IR ANGIO/SPINAL RIGHT  04/25/2021   IR ANGIO/SPINAL RIGHT  04/25/2021   IR ANGIO/SPINAL RIGHT  04/25/2021   IR ANGIO/SPINAL RIGHT  04/25/2021   IR ANGIO/SPINAL RIGHT  04/25/2021   IR ANGIO/SPINAL RIGHT  04/25/2021   IR ANGIO/SPINAL RIGHT  04/25/2021   IR ANGIO/SPINAL RIGHT  04/25/2021   IR ANGIO/SPINAL RIGHT  04/25/2021   IR ANGIO/SPINAL RIGHT  04/25/2021   IR ANGIO/SPINAL RIGHT  04/25/2021   IR ANGIOGRAM EXTREMITY BILATERAL  04/25/2021   IR RADIOLOGIST EVAL & MGMT  03/21/2021   RADIOLOGY WITH ANESTHESIA N/A 04/25/2021   Procedure: IR WITH ANESTHESIA SPINAL ANGIOGRAM;  Surgeon: Luanne Bras, MD;  Location: [facility];  Service: Radiology;  Laterality: N/A;     reports that he has never smoked. He has never used smokeless tobacco. He reports that he does not currently use alcohol. He reports that he does not use drugs.  No Known Allergies  Family History  Problem Relation Age of Onset   Cancer Maternal Grandmother     Prior to Admission medications   Medication Sig Start Date End Date Taking? Authorizing  Provider  amLODipine (NORVASC) 5 MG tablet Take 5 mg by mouth daily.    [provider]  diazepam (VALIUM) 2 MG tablet TAKE 1 TABLET BY MOUTH IN THE MORNING AND AT BEDTIME 08/15/22   Lovorn, Jinny Blossom, MD  docusate sodium (ENEMEEZ) 283 MG enema Place 1 enema (283 mg total) rectally daily. For bowel program- for paraplegia 10/26/21   Lovorn, Jinny Blossom, MD  FLUoxetine (PROZAC) 10 MG capsule Take 1 capsule (10 mg total) by mouth daily. For mood 04/09/21   Lovorn, Jinny Blossom, MD  labetalol (NORMODYNE) 100 MG tablet Take 100 mg by mouth 2 (two) times daily.    [provider]  lactulose (CHRONULAC) 10 GM/15ML solution Take 20 g by mouth daily.    [provider]  losartan (COZAAR) 50 MG tablet Take 50 mg by mouth daily.    [provider]  Magnesium 250 MG TABS Take 400  mg by mouth daily.    [provider]  magnesium hydroxide (DULCOLAX) 400 MG/5ML suspension Take by mouth daily as needed for mild constipation.    [provider]  Multiple Vitamins-Minerals (CENTRUM MEN PO) Take 1 tablet by mouth daily.    [provider]  pantoprazole (PROTONIX) 40 MG tablet Take 40 mg by mouth daily.    [provider]  polycarbophil (FIBERCON) 625 MG tablet Take 1,250 mg by mouth daily.    [provider]  rosuvastatin (CRESTOR) 5 MG tablet Take 5 mg by mouth daily.    [provider]  senna (SENOKOT) 8.6 MG TABS tablet Take 2 tablets by mouth daily.    [provider]  sildenafil (VIAGRA) 100 MG tablet Take 100 mg by mouth See admin instructions. Take 100 mg every 3 days as needed for erectile dysfunction 02/15/21   [provider]  tiZANidine (ZANAFLEX) 4 MG tablet TAKE 1/2 TO 1 (ONE-HALF TO ONE) TABLET BY MOUTH TWICE DAILY 02/18/22   Lovorn, Jinny Blossom, MD  traMADol (ULTRAM) 50 MG tablet Take 1 tablet (50 mg total) by mouth every 6 (six) hours as needed. 02/04/22   Lovorn, Jinny Blossom, MD  triamterene-hydrochlorothiazide (MAXZIDE-25)  37.5-25 MG tablet Take 1 tablet by mouth daily.    [provider]    Physical Exam: Vitals:   08/25/22 1600 08/25/22 1700 08/25/22 2101 08/25/22 2230  BP: 119/72 114/69 118/83 124/70  Pulse: (!) 58 (!) 55 64 68  Resp: _0 Temp:   99.1 F (37.3 C)   TempSrc:   Oral   SpO2: 100% 100% 99% 100%    Physical Exam Vitals reviewed.  Constitutional:      General: He is not in acute distress. HENT:     Head: Normocephalic and atraumatic.  Eyes:     Extraocular Movements: Extraocular movements intact.  Cardiovascular:     Rate and Rhythm: Normal rate and regular rhythm.     Pulses: Normal pulses.  Pulmonary:     Effort: Pulmonary effort is normal. No respiratory distress.     Breath sounds: Normal breath sounds. No wheezing or rales.  Abdominal:     General: Bowel sounds are normal. There is no distension.     Palpations: Abdomen is soft.     Tenderness: There is no abdominal tenderness.  Musculoskeletal:     Cervical back: Normal range of motion.     Right lower leg: No edema.     Left lower leg: No edema.  Skin:    General: Skin is warm and dry.  Neurological:     Mental Status: He is alert and oriented to person, place, and time.     Cranial Nerves: No cranial nerve deficit.     Comments: He is able to move both of his lower extremities and appears to have equal strength.  Sensation to light touch intact.  No upper extremity weakness.     Labs on Admission: I have personally reviewed following labs and imaging studies  CBC: Recent Labs  Lab 08/25/22 1625  WBC 10.0  NEUTROABS 6.4  HGB 11.5*  HCT 35.1*  MCV 85.0  PLT 557   Basic Metabolic Panel: Recent Labs  Lab 08/25/22 1625  NA 136  K 5.1  CL 99  CO2 24  GLUCOSE 103*  BUN 32*  CREATININE 2.75*  CALCIUM 9.4   GFR: CrCl cannot be calculated (Unknown ideal weight.). Liver Function Tests: No results for input(s): "AST", "ALT", "ALKPHOS", "  BILITOT", "PROT", "ALBUMIN" in the last 168  hours. No results for input(s): "LIPASE", "AMYLASE" in the last 168 hours. No results for input(s): "AMMONIA" in the last 168 hours. Coagulation Profile: No results for input(s): "INR", "PROTIME" in the last 168 hours. Cardiac Enzymes: No results for input(s): "CKTOTAL", "CKMB", "CKMBINDEX", "TROPONINI" in the last 168 hours. BNP (last 3 results) No results for input(s): "PROBNP" in the last 8760 hours. HbA1C: No results for input(s): "HGBA1C" in the last 72 hours. CBG: No results for input(s): "GLUCAP" in the last 168 hours. Lipid Profile: No results for input(s): "CHOL", "HDL", "LDLCALC", "TRIG", "CHOLHDL", "LDLDIRECT" in the last 72 hours. Thyroid Function Tests: No results for input(s): "TSH", "T4TOTAL", "FREET4", "T3FREE", "THYROIDAB" in the last 72 hours. Anemia Panel: No results for input(s): "VITAMINB12", "FOLATE", "FERRITIN", "TIBC", "IRON", "RETICCTPCT" in the last 72 hours. Urine analysis:    Component Value Date/Time   COLORURINE YELLOW 08/25/2022 2118   APPEARANCEUR CLEAR 08/25/2022 2118   LABSPEC 1.008 08/25/2022 2118   PHURINE 5.0 08/25/2022 2118   GLUCOSEU NEGATIVE 08/25/2022 2118   HGBUR NEGATIVE 08/25/2022 2118   BILIRUBINUR NEGATIVE 08/25/2022 2118   KETONESUR NEGATIVE 08/25/2022 2118   PROTEINUR NEGATIVE 08/25/2022 2118   NITRITE NEGATIVE 08/25/2022 2118   LEUKOCYTESUR TRACE (A) 08/25/2022 2118    Radiological Exams on Admission: MR Cervical Spine W or Wo Contrast  Addendum Date: 08/25/2022   ADDENDUM REPORT: 08/25/2022 23:30 ADDENDUM: Additional comparison is made to MRI thoracic spine 03/14/2021. The T1 and T2 hypointense signal in the spinal cord posterior to T9 appears unchanged compared to 2022. These findings were discussed by telephone on 08/25/2022 at 11:19 pm with provider Khaliqdina. Electronically Signed   By: Merilyn Baba M.D.   On: 08/25/2022 23:30   Result Date: 08/25/2022 CLINICAL DATA:  Ataxia, cervical or thoracic pathology suspected  EXAM: MRI CERVICAL AND THORACIC SPINE WITHOUT AND WITH CONTRAST TECHNIQUE: Multiplanar and multiecho pulse sequences of the cervical spine, to include the craniocervical junction and cervicothoracic junction, and the thoracic spine, were obtained without and with intravenous contrast. CONTRAST:  7.76m GADAVIST GADOBUTROL 1 MMOL/ML IV SOLN COMPARISON:  MRI cervical and thoracic spine 04/15/2020 FINDINGS: MRI CERVICAL SPINE FINDINGS Alignment: Straightening of the normal cervical lordosis. Mild dextrocurvature. No listhesis. Vertebrae: No fracture, evidence of discitis, or bone lesion. Diffusely decreased marrow signal. No abnormal enhancement. Cord: Normal signal and morphology.  No abnormal enhancement. Posterior Fossa, vertebral arteries, paraspinal tissues: Negative. Disc levels: Preserved disc hydration without significant disc bulge. No spinal canal stenosis or neural foraminal narrowing in the cervical spine. MRI THORACIC SPINE FINDINGS Alignment: S shaped curvature of the thoracolumbar spine. No significant listhesis. Vertebrae: No fracture, evidence of discitis, or bone lesion. No abnormal enhancement. Cord: The cord is normal in morphology. On susceptibility weighted imaging, there is hypointense signal in the anterior spinal cord at T8-T9 to T9-T10 (series 14, images 25-30). This is associated with hypointense signal on T1 and T2 weighted sequences, most likely chronic hemorrhage, which correlates with an area of enhancement on the prior MRI. No abnormal enhancement or increased T2 hyperintense signal on the current study. Paraspinal and other soft tissues: Small right-greater-than-left pleural effusions. Disc levels: No significant spinal canal stenosis or neural foraminal narrowing. IMPRESSION: 1. 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 No abnormal enhancement  or increased T2 hyperintense signal on the current study; abnormal signal in the adjacent cord on the prior exam could have reflected edema related to the hemorrhage. 2. Diffusely decreased marrow signal, which is nonspecific but can be seen in the setting of anemia, obesity or smoking. 3. Small right-greater-than-left pleural effusions. 4. No spinal canal stenosis or neural foraminal narrowing in the cervical or thoracic spine. Electronically Signed: By: Merilyn Baba M.D. On: 08/25/2022 21:23   MR THORACIC SPINE W WO CONTRAST  Addendum Date: 08/25/2022   ADDENDUM REPORT: 08/25/2022 23:30 ADDENDUM: Additional comparison is made to MRI thoracic spine 03/14/2021. The T1 and T2 hypointense signal in the spinal cord posterior to T9 appears unchanged compared to 2022. These findings were discussed by telephone on 08/25/2022 at 11:19 pm with provider Khaliqdina. Electronically Signed   By: Merilyn Baba M.D.   On: 08/25/2022 23:30   Result Date: 08/25/2022 CLINICAL DATA:  Ataxia, cervical or thoracic pathology suspected EXAM: MRI CERVICAL AND THORACIC SPINE WITHOUT AND WITH CONTRAST TECHNIQUE: Multiplanar and multiecho pulse sequences of the cervical spine, to include the craniocervical junction and cervicothoracic junction, and the thoracic spine, were obtained without and with intravenous contrast. CONTRAST:  7.70m GADAVIST GADOBUTROL 1 MMOL/ML IV SOLN COMPARISON:  MRI cervical and thoracic spine 04/15/2020 FINDINGS: MRI CERVICAL SPINE FINDINGS Alignment: Straightening of the normal cervical lordosis. Mild dextrocurvature. No listhesis. Vertebrae: No fracture, evidence of discitis, or bone lesion. Diffusely decreased marrow signal. No abnormal enhancement. Cord: Normal signal and morphology.  No abnormal enhancement. Posterior Fossa, vertebral arteries, paraspinal tissues: Negative. Disc levels: Preserved disc hydration without significant disc bulge. No  spinal canal stenosis or neural foraminal narrowing in the cervical spine. MRI THORACIC SPINE FINDINGS Alignment: S shaped curvature of the thoracolumbar spine. No significant listhesis. Vertebrae: No fracture, evidence of discitis, or bone lesion. No abnormal enhancement. Cord: The cord is normal in morphology. On susceptibility weighted imaging, there is hypointense signal in the anterior spinal cord at T8-T9 to T9-T10 (series 14, images 25-30). This is associated with hypointense signal on T1 and T2 weighted sequences, most likely chronic hemorrhage, which correlates with an area of enhancement on the prior MRI. No abnormal enhancement or increased T2 hyperintense signal on the current study. Paraspinal and other soft tissues: Small right-greater-than-left pleural effusions. Disc levels: No significant spinal canal stenosis or neural foraminal narrowing. IMPRESSION: 1. 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 No abnormal enhancement or increased T2 hyperintense signal on the current study; abnormal signal in the adjacent cord on the prior exam could have reflected edema related to the hemorrhage. 2. Diffusely decreased marrow signal, which is nonspecific but can be seen in the setting of anemia, obesity or smoking. 3. Small right-greater-than-left pleural effusions. 4. No spinal canal stenosis or neural foraminal narrowing in the cervical or thoracic spine. Electronically Signed: By: AMerilyn BabaM.D. On: 08/25/2022 21:23   MR Lumbar Spine W Wo Contrast  Result Date: 08/25/2022 CLINICAL DATA:  Low back pain, cauda equina syndrome suspected EXAM: MRI LUMBAR SPINE WITHOUT AND WITH CONTRAST TECHNIQUE: Multiplanar and multiecho pulse sequences of the lumbar spine were obtained without and with intravenous contrast.  CONTRAST:  7.519mGADAVIST GADOBUTROL 1 MMOL/ML IV SOLN COMPARISON:  MRI lumbar spine 04/15/2020 FINDINGS: Segmentation: 5 lumbar type vertebral bodies. Partial sacralization of  S1, with the last fully formed disc space at S1-S2. Alignment:  Mild dextrocurvature.  No listhesis. Vertebrae: No fracture, evidence of discitis, or bone lesion. No abnormal enhancement. Conus medullaris and cauda equina: Conus extends to the L1-L2 level. Conus and cauda equina appear normal. No abnormal enhancement. Paraspinal and other soft tissues: Small renal cysts, for which no follow-up is indicated. Normal muscle bulk and signal. Disc levels: T12-L1: No significant disc bulge. No spinal canal stenosis or neural foraminal narrowing. L1-L2: No significant disc bulge. No spinal canal stenosis or neural foraminal narrowing. L2-L3: No significant disc bulge. No spinal canal stenosis or neural foraminal narrowing. L3-L4: No significant disc bulge. No spinal canal stenosis or neural foraminal narrowing. L4-L5: No significant disc bulge. No spinal canal stenosis or neural foraminal narrowing. L5-S1: Minimal disc bulge. Mild facet arthropathy. No spinal canal stenosis or neural foraminal narrowing. IMPRESSION: No spinal canal stenosis or neural foraminal narrowing. No abnormal enhancement. No evidence of cauda equina syndrome. Electronically Signed   By: Merilyn Baba M.D.   On: 08/25/2022 20:29    Assessment and Plan  Left leg weakness -Patient with history of transverse myelitis and followed by Dr. [name] with neurology as outpatient since 2021.  He has completed extensive rehab with documented deficits including bilateral lower extremity weakness, neurogenic bladder does self-catheterization, neurogenic bowel, and use of walking cane or walker as needed.   -He presents with worsening left leg weakness x 2 days and now wheelchair bound.  -ESR 98, CRP 9.5. 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 No abnormal enhancement or increased T2 hyperintense signal on the current study; abnormal signal in the adjacent cord on the prior exam could have reflected edema related to the hemorrhage. Diffusely decreased marrow signal, which is nonspecific but can be seen in the setting of anemia, obesity or smoking. No spinal canal stenosis or neural foraminal narrowing in the cervical or thoracic spine. Additional comparison is made to MRI thoracic spine 03/14/2021. The T1 and T2 hypointense signal in the spinal cord posterior to T9 appears unchanged compared to 2022. -MRI of lumbar spine w wo contrast read by radiologist as: No spinal canal stenosis or neural foraminal narrowing. No abnormal enhancement. No evidence of cauda equina syndrome. -Neurosurgery reviewed the patient's MRI scans and felt that there is no indication for acute neurosurgical intervention.  Felt that the changes look chronic but cannot rule out repeat hemorrhage.  Neurology has been consulted.  AKI BUN 32, creatinine 2.7 (was 1.2 in July 2022).  Patient states he was seen by urology in September and at that time his creatinine was 2.3-2.4. -Continue IV fluid hydration with normal saline at 100 cc/h -Renal ultrasound ordered -Avoid nephrotoxic agents/hold home torsemide and valsartan -Monitor renal function closely  Normocytic anemia Hemoglobin 11.5, was 13.0 in July 2022.  Not endorsing any symptoms of GI bleed. -Anemia panel  Left lower leg pain -Venous Doppler ordered by ED provider to rule out DVT  Pleural effusions MRI showing small R>L pleural effusions.  No documented history of CHF or prior echo results in the chart. -Check BNP  Anxiety -Continue Valium  GERD -Continue  Protonix  Hypertension Stable. -Continue amlodipine and Coreg -Hold torsemide and valsartan given AKI  Hyperlipidemia -Continue Crestor  History of transverse myelitis Chronic neurogenic bladder/bowel -Continue self-catheterization -Laxative as needed for constipation  DVT prophylaxis: No chemical DVT prophylaxis.  No SCDs at this time as venous Doppler pending to rule out DVT. Code Status: Full Code Family Communication: No family available at this time. Consults called: Neurosurgery, neurology Level of care: Med-Surg Admission status: It is my clinical opinion that referral for OBSERVATION is reasonable and necessary in this patient based on the above information provided. The aforementioned taken together are felt to place the patient at high risk for further clinical deterioration. However, it is anticipated that the patient may be medically stable for discharge from the hospital within 24 to 48 hours.   Shela Leff MD [facility]  If 7PM-7AM, please contact night-coverage [email or web address]  08/25/2022, 11:40 PM

## 2022-08-25 NOTE — ED Notes (Signed)
Patient is being transported to MRI.

## 2022-08-25 NOTE — ED Triage Notes (Signed)
Patient here with left leg weakness x 2 days. Had transverse myelitis and was originally in [facility] Now with rehab up and moving with walker. Now x 2 days back to WC. Left lower leg warm , no redness but mild swelling noted

## 2022-08-25 NOTE — Progress Notes (Signed)
Patient ID: Maurice Cruz, male   DOB: 12-21-1965, 56 y.o.   MRN: [id number] We were called to look at the MRI on this 56 yo male with a h/o "transverse myelitis" first diagnosed while living in [place] n 2020. Has been followed by Dr [name] of neurology as outpt since 2021. Felt to have a possible anterior cord hemorrhage by sater (related to TM or vascular lesion), so spinal angio completed 7/22 that showed no spinal AVM/AVF. Has completed extensive rehab with documented deficits including B LE weakness, self cath, neurogenic bowel, and use of WC or walker as needed.   New MRI shows small area of possible CHRONIC changes or hemorrhage in the anterior cord T8-T10 without acute edema or enhancement. I do not see acute hemorrhage or edema or T2 hyperintense signal change or cord mass effect or stenosis that requires neurosurgical intervention.  No indication for acute neurosurgical intervention. Changes look chronic but cannot r/o repeat hemorrhage in the interim between the MRIs. Etiology? Does he need repeat spinal angio? Recommend neurology consult and f/u with Dr [name].

## 2022-08-25 NOTE — ED Provider Triage Note (Signed)
Emergency Medicine Provider Triage Evaluation Note  Maurice Cruz , a 56 y.o. male  was evaluated in triage.  Pt complains of left leg weakness.  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  Diagnosed in 2021.  Had extensive rehab for his lower extremity weakness.  States that his new baseline as of several months ago as he is able to walk with a cane.  In the last couple days he noticed left leg weakness making it difficult to bear weight and walk.  Now patient is wheelchair-bound.  Denies facial droop, slurred speech, or any other focal neurodeficits.  States he is not on blood thinners.  Review of Systems  Positive: See above Negative: See above  Physical Exam  There were no vitals taken for this visit. Gen:   Awake, no distress   Resp:  Normal effort  MSK:   Moves extremities without difficulty  Other:  Strength and sensation of left and right leg are symmetrical  Medical Decision Making  Medically screening exam initiated at 12:12 PM.  Appropriate orders placed.  Maurice Cruz was informed that the remainder of the evaluation will be completed by another provider, this initial triage assessment does not replace that evaluation, and the importance of remaining in the ED until their evaluation is complete.  Work up initiated   Harriet Pho, PA-C 08/25/22 1216

## 2022-08-25 NOTE — Progress Notes (Signed)
VASCULAR LAB    Attempted LEV, however patient was in MRI.  Touched base with Edison Nasuti in MRI who said the patient would be there for at least another hour.  I spoke with Dr. Maylon Peppers and let her know that the study would have to be done tomorrow if the patient was still here/admitted, or we can see him as an outpatient.     Bing Duffey, RVT 08/25/2022, 7:51 PM

## 2022-08-26 ENCOUNTER — Observation Stay (HOSPITAL_COMMUNITY): Payer: BC Managed Care – PPO

## 2022-08-26 ENCOUNTER — Observation Stay (HOSPITAL_BASED_OUTPATIENT_CLINIC_OR_DEPARTMENT_OTHER): Payer: BC Managed Care – PPO

## 2022-08-26 DIAGNOSIS — R52 Pain, unspecified: Secondary | ICD-10-CM | POA: Diagnosis not present

## 2022-08-26 DIAGNOSIS — I10 Essential (primary) hypertension: Secondary | ICD-10-CM | POA: Insufficient documentation

## 2022-08-26 DIAGNOSIS — N179 Acute kidney failure, unspecified: Principal | ICD-10-CM

## 2022-08-26 DIAGNOSIS — J90 Pleural effusion, not elsewhere classified: Secondary | ICD-10-CM | POA: Insufficient documentation

## 2022-08-26 DIAGNOSIS — D649 Anemia, unspecified: Secondary | ICD-10-CM | POA: Insufficient documentation

## 2022-08-26 DIAGNOSIS — G9519 Other vascular myelopathies: Secondary | ICD-10-CM

## 2022-08-26 DIAGNOSIS — K219 Gastro-esophageal reflux disease without esophagitis: Secondary | ICD-10-CM | POA: Insufficient documentation

## 2022-08-26 DIAGNOSIS — E785 Hyperlipidemia, unspecified: Secondary | ICD-10-CM | POA: Insufficient documentation

## 2022-08-26 DIAGNOSIS — M79605 Pain in left leg: Secondary | ICD-10-CM | POA: Insufficient documentation

## 2022-08-26 DIAGNOSIS — F419 Anxiety disorder, unspecified: Secondary | ICD-10-CM | POA: Insufficient documentation

## 2022-08-26 LAB — IRON AND TIBC
Iron: 46 ug/dL (ref 45–182)
Saturation Ratios: 21 % (ref 17.9–39.5)
TIBC: 223 ug/dL — ABNORMAL LOW (ref 250–450)
UIBC: 177 ug/dL

## 2022-08-26 LAB — BASIC METABOLIC PANEL
Anion gap: 11 (ref 5–15)
BUN: 33 mg/dL — ABNORMAL HIGH (ref 6–20)
CO2: 21 mmol/L — ABNORMAL LOW (ref 22–32)
Calcium: 8.6 mg/dL — ABNORMAL LOW (ref 8.9–10.3)
Chloride: 107 mmol/L (ref 98–111)
Creatinine, Ser: 2.63 mg/dL — ABNORMAL HIGH (ref 0.61–1.24)
GFR, Estimated: 28 mL/min — ABNORMAL LOW (ref >60)
Glucose, Bld: 125 mg/dL — ABNORMAL HIGH (ref 70–99)
Potassium: 4.8 mmol/L (ref 3.5–5.1)
Sodium: 139 mmol/L (ref 135–145)

## 2022-08-26 LAB — HIV ANTIBODY (ROUTINE TESTING W REFLEX): HIV Screen 4th Generation wRfx: NONREACTIVE

## 2022-08-26 LAB — RETICULOCYTES
Immature Retic Fract: 11.7 % (ref 2.3–15.9)
RBC.: 3.43 MIL/uL — ABNORMAL LOW (ref 4.22–5.81)
Retic Count, Absolute: 38.4 10*3/uL (ref 19.0–186.0)
Retic Ct Pct: 1.1 % (ref 0.4–3.1)

## 2022-08-26 LAB — RPR: RPR Ser Ql: NONREACTIVE

## 2022-08-26 LAB — FOLATE: Folate: 29.9 ng/mL (ref >5.9)

## 2022-08-26 LAB — TSH: TSH: 0.872 u[IU]/mL (ref 0.350–4.500)

## 2022-08-26 LAB — VITAMIN B12: Vitamin B-12: 1004 pg/mL — ABNORMAL HIGH (ref 180–914)

## 2022-08-26 LAB — BRAIN NATRIURETIC PEPTIDE: B Natriuretic Peptide: 11.1 pg/mL (ref 0.0–100.0)

## 2022-08-26 LAB — FERRITIN: Ferritin: 117 ng/mL (ref 24–336)

## 2022-08-26 MED ORDER — TIZANIDINE HCL 4 MG PO TABS
2.0000 mg | ORAL_TABLET | Freq: Every day | ORAL | Status: DC
  Administered 2022-08-26 – 2022-08-28 (×4): 2 mg via ORAL
  Filled 2022-08-26 (×4): qty 1

## 2022-08-26 MED ORDER — DIAZEPAM 2 MG PO TABS
2.0000 mg | ORAL_TABLET | Freq: Every day | ORAL | Status: DC
  Administered 2022-08-26 – 2022-08-28 (×4): 2 mg via ORAL
  Filled 2022-08-26 (×4): qty 1

## 2022-08-26 MED ORDER — ACETAMINOPHEN 325 MG PO TABS
650.0000 mg | ORAL_TABLET | Freq: Four times a day (QID) | ORAL | Status: DC | PRN
  Administered 2022-08-27: 650 mg via ORAL
  Filled 2022-08-26: qty 2

## 2022-08-26 MED ORDER — ACETAMINOPHEN 650 MG RE SUPP: 650.0000 mg | Freq: Four times a day (QID) | RECTAL | Status: DC | PRN

## 2022-08-26 MED ORDER — SODIUM CHLORIDE 0.9 % IV SOLN: INTRAVENOUS | Status: DC

## 2022-08-26 MED ORDER — POLYETHYLENE GLYCOL 3350 17 G PO PACK: 17.0000 g | PACK | Freq: Every day | ORAL | Status: DC | PRN

## 2022-08-26 MED ORDER — ROSUVASTATIN CALCIUM 5 MG PO TABS
5.0000 mg | ORAL_TABLET | Freq: Every day | ORAL | Status: DC
  Administered 2022-08-26 – 2022-08-29 (×4): 5 mg via ORAL
  Filled 2022-08-26 (×4): qty 1

## 2022-08-26 MED ORDER — PANTOPRAZOLE SODIUM 40 MG PO TBEC
40.0000 mg | DELAYED_RELEASE_TABLET | Freq: Every day | ORAL | Status: DC
  Administered 2022-08-26 – 2022-08-29 (×4): 40 mg via ORAL
  Filled 2022-08-26 (×4): qty 1

## 2022-08-26 MED ORDER — CARVEDILOL 25 MG PO TABS
25.0000 mg | ORAL_TABLET | Freq: Two times a day (BID) | ORAL | Status: DC
  Administered 2022-08-27 – 2022-08-29 (×5): 25 mg via ORAL
  Filled 2022-08-26 (×5): qty 1

## 2022-08-26 MED ORDER — AMLODIPINE BESYLATE 10 MG PO TABS
10.0000 mg | ORAL_TABLET | Freq: Every day | ORAL | Status: DC
  Administered 2022-08-26 – 2022-08-29 (×4): 10 mg via ORAL
  Filled 2022-08-26: qty 2
  Filled 2022-08-26 (×3): qty 1

## 2022-08-26 MED ORDER — SODIUM CHLORIDE 0.9 % IV SOLN: INTRAVENOUS | Status: AC

## 2022-08-26 NOTE — Consult Note (Addendum)
NEUROLOGY CONSULTATION NOTE   Date of service: August 26, 2022 Patient Name: Maurice Cruz MRN:  [id number] DOB:  1966-03-09 Reason for consult: "worsening BL lower extremity weakness" Requesting Provider: Shela Leff, MD _ _ _   _ __   _ __ _ _  __ __   _ __   __ _  History of Present Illness  Maurice Cruz is a 56 y.o. male with PMH significant for GERD, HTN, history of spinal cord hemorrhage in 2021 and not transverse myelitis with resultant BL lower ext weakness with right worse than left and BL lower ext numbness from T8-9 below and neurogenic bladder requiring straight cath at home.  He presents today with 4 day hx of progressive worsening BL lower ext weakness. RMI C, T, L spine w + w/o C was obtained which was negative for any cord compression, no enhancing lesions, noted cord hemorrhage is stable compared to prior from July 2022.  He also has a mild AKI, neurosurgery team did not see the patient but reviewed the images and no need for any intervention.  Reports that he noticed that his legs felt a little numb on Wednesday and also all day Thursday. Did not think too much about it. Friday, felt weak. Was able to walk short distance in house but did not go outside that day. Saturday, weakness had worsened and uable to walk. Spentthe entire day at home. Came in to the ED when his symptoms did not I'mprove on Sunday.  Recent URI 2 weeks ago with some diarrhea last week. Reports that he has noticed that urine has been more cloudy over the last month and so called his urologist but did not think too much of it.  He initially had BL lower ext weakness, numbness and urinary incontinence about 2 years ago. It was felt to be potentially inflammatory and was given steroids and IVIG with no improvement. He had a spinal angiogram with no malformation, no lesion. He had repeat imaging in 2022 which was concerning for this being a potential spinal hemorrhage, no new spinal lesions noted.  He  does feel that he is a little improved since coming to the ED but not quite back to his baseline.   ROS   Constitutional Denies weight loss, fever and chills.   HEENT Denies changes in vision and hearing.   Respiratory Denies SOB and cough.   CV Denies palpitations and CP   GI Denies abdominal pain, nausea, vomiting and diarrhea.   GU Denies dysuria and urinary frequency.   MSK Denies myalgia and joint pain.   Skin Denies rash and pruritus.   Neurological Denies headache and syncope.   Psychiatric Denies recent changes in mood. Denies anxiety and depression.    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[place])    Past Surgical History:  Procedure Laterality Date   Anorectal biospy  02/09/2021   Colon Biospy  02/09/2021   IR ANGIO INTRA EXTRACRAN SEL COM CAROTID INNOMINATE BILAT MOD SED  04/25/2021  IR ANGIO VERTEBRAL SEL SUBCLAVIAN INNOMINATE UNI L MOD SED  04/25/2021   IR ANGIO VERTEBRAL SEL VERTEBRAL UNI R MOD SED  04/25/2021   IR ANGIO/SPINAL LEFT  04/25/2021   IR ANGIO/SPINAL LEFT  04/25/2021   IR ANGIO/SPINAL LEFT  04/25/2021   IR ANGIO/SPINAL LEFT  04/25/2021   IR ANGIO/SPINAL LEFT  04/25/2021   IR ANGIO/SPINAL LEFT  04/25/2021   IR ANGIO/SPINAL LEFT  04/25/2021   IR ANGIO/SPINAL LEFT  04/25/2021   IR ANGIO/SPINAL LEFT  04/25/2021   IR ANGIO/SPINAL LEFT  04/25/2021   IR ANGIO/SPINAL LEFT  04/25/2021   IR ANGIO/SPINAL LEFT  04/25/2021   IR ANGIO/SPINAL LEFT  04/25/2021   IR ANGIO/SPINAL RIGHT  04/25/2021   IR ANGIO/SPINAL RIGHT  04/25/2021   IR ANGIO/SPINAL RIGHT  04/25/2021   IR ANGIO/SPINAL RIGHT  04/25/2021   IR ANGIO/SPINAL RIGHT   04/25/2021   IR ANGIO/SPINAL RIGHT  04/25/2021   IR ANGIO/SPINAL RIGHT  04/25/2021   IR ANGIO/SPINAL RIGHT  04/25/2021   IR ANGIO/SPINAL RIGHT  04/25/2021   IR ANGIO/SPINAL RIGHT  04/25/2021   IR ANGIO/SPINAL RIGHT  04/25/2021   IR ANGIO/SPINAL RIGHT  04/25/2021   IR ANGIO/SPINAL RIGHT  04/25/2021   IR ANGIO/SPINAL RIGHT  04/25/2021   IR ANGIOGRAM EXTREMITY BILATERAL  04/25/2021   IR RADIOLOGIST EVAL & MGMT  03/21/2021   RADIOLOGY WITH ANESTHESIA N/A 04/25/2021   Procedure: IR WITH ANESTHESIA SPINAL ANGIOGRAM;  Surgeon: Luanne Bras, MD;  Location: [facility];  Service: Radiology;  Laterality: N/A;   Family History  Problem Relation Age of Onset   Cancer Maternal Grandmother    Social History   Socioeconomic History   Marital status: Single    Spouse name: Not on file   Number of children: 0   Years of education: HS   Highest education level: Not on file  Occupational History   Occupation: On disability  Tobacco Use   Smoking status: Never   Smokeless tobacco: Never  Vaping Use   Vaping Use: Never used  Substance and Sexual Activity   Alcohol use: Not Currently   Drug use: Never   Sexual activity: Not Currently  Other Topics Concern   Not on file  Social History Narrative   Right handed   1 glass tea per day   Coffee sometimes    No Soda   Lives with parents.   Social Determinants of Health   Financial Resource Strain: Not on file  Food Insecurity: Not on file  Transportation Needs: Not on file  Physical Activity: Not on file  Stress: Not on file  Social Connections: Not on file   No Known Allergies  Medications  (Not in a hospital admission)    Vitals   Vitals:   08/25/22 1600 08/25/22 1700 08/25/22 2101 08/25/22 2230  BP: 119/72 114/69 118/83 124/70  Pulse: (!) 58 (!) 55 64 68  Resp: 18  18 18   Temp:   99.1 F (37.3 C)   TempSrc:   Oral   SpO2: 100% 100% 99% 100%     There is no height or weight on file to calculate BMI.  Physical Exam   General:  Laying comfortably in bed; in no acute distress.  HENT: Normal oropharynx and mucosa. Normal external appearance of ears and nose.  Neck: Supple, no pain or tenderness  CV: No JVD. No peripheral edema.  Pulmonary: Symmetric Chest rise. Normal respiratory effort.  Abdomen: Soft to touch, non-tender.  Ext: No cyanosis, edema, or  deformity  Skin: No rash. Normal palpation of skin.   Musculoskeletal: Normal digits and nails by inspection. No clubbing.   Neurologic Examination  Mental status/Cognition: Alert, oriented to self, place, month and year, good attention.  Speech/language: Fluent, comprehension intact, object naming intact, repetition intact.  Cranial nerves:   CN II Pupils equal and reactive to light, no VF deficits    CN III,IV,VI EOM intact, no gaze preference or deviation, no nystagmus    CN V normal sensation in V1, V2, and V3 segments bilaterally   CN VII no asymmetry, no nasolabial fold flattening    CN VIII normal hearing to speech    CN IX & X normal palatal elevation, no uvular deviation    CN XI 5/5 head turn and 5/5 shoulder shrug bilaterally    CN XII midline tongue protrusion    Motor:  Muscle bulk: normal, tone normal, pronator drift none tremor none Mvmt Root Nerve  Muscle Right Left Comments  SA C5/6 Ax Deltoid 5 5   EF C5/6 Mc Biceps 5 5   EE C6/7/8 Rad Triceps 5 5   WF C6/7 Med FCR     WE C7/8 PIN ECU     F Ab C8/T1 U ADM/FDI 5 5   HF L1/2/3 Fem Illopsoas 3 4   KE L2/3/4 Fem Quad 2 4   DF L4/5 D Peron Tib Ant 2 3   PF S1/2 Tibial Grc/Sol 2 3    Reflexes:  Right Left Comments  Pectoralis      Biceps (C5/6) 2 2   Brachioradialis (C5/6) 2 2    Triceps (C6/7) 2 2    Patellar (L3/4) 3 3 Cross adductors positive.   Achilles (S1) 3 3    Hoffman      Plantar up up   Jaw jerk    Sensation:  Light touch Intact in BL upper extremities and RLE, decreased in LLE   Pin prick Intact in BL upper extremities and RLE, decreased in LLE   Temperature     Vibration   Proprioception    Coordination/Complex Motor:  - Finger to Nose intact bL - Heel to shin unable to do - Rapid alternating movement are normal - Gait: deferred.  Labs   CBC:  Recent Labs  Lab 08/25/22 1625  WBC 10.0  NEUTROABS 6.4  HGB 11.5*  HCT 35.1*  MCV 85.0  PLT 161    Basic Metabolic Panel:  Lab Results  Component Value Date   NA 136 08/25/2022   K 5.1 08/25/2022   CO2 24 08/25/2022   GLUCOSE 103 (H) 08/25/2022   BUN 32 (H) 08/25/2022   CREATININE 2.75 (H) 08/25/2022   CALCIUM 9.4 08/25/2022   GFRNONAA 27 (L) 08/25/2022   Lipid Panel: No results found for: "LDLCALC" HgbA1c: No results found for: "HGBA1C" Urine Drug Screen: No results found for: "LABOPIA", "COCAINSCRNUR", "LABBENZ", "AMPHETMU", "THCU", "LABBARB"  Alcohol Level No results found for: "ETH"  MRI C, T, L spine with and without contrast: Chronic hemorrhage in the anterior spinal cord from T8-T9 to T9-T10, which correlates with an area of enhancement and hypointense signal on the 2021 MRI. This is noted on MRI T spine from 2022 and is stable on MRI from 2022.  Impression   Maurice Cruz is a 56 y.o. male with PMH significant for GERD, HTN, history of spinal cord hemorrhage in 2021 and not transverse myelitis with resultant BL lower ext weakness with right worse than left and BL lower  ext numbness from T8-9 below and neurogenic bladder requiring straight cath at home. He presents today with 4 day hx of progressive worsening BL lower ext weakness. MRI C, T, L spine shows chronic hemorrhage on GRE sequences. No demyelinating lesions noted.  No clear etiology of his presentation. Likely suspect that this is fluctuating in the setting of a metabolic stressor. He does have AKI on review of labs.   Impression: - Intramedullary spinal cord hematoma(Hematomyelia) - AKI  Recommendations  - Treatment of AKI per primary team. UA not concerning for UTI. - TSH, B12, Folate, RPR. - PT and  OT. ______________________________________________________________________   Thank you for the opportunity to take part in the care of this patient. If you have any further questions, please contact the neurology consultation attending.  Signed,  [place] Pager Number [phone] _ _ _   _ __   _ __ _ _  __ __   _ __   __ _

## 2022-08-26 NOTE — Telephone Encounter (Signed)
Attending physician's statement faxed to [facility] (fax# [phone]) on 11/27

## 2022-08-26 NOTE — ED Notes (Signed)
Pt given turkey sandwich and water

## 2022-08-26 NOTE — Plan of Care (Signed)
B12 1004, c/w supplementation  TSH,Folate WNL RPR negative  Plan per Dr. Bartholome Bill note earlier today, please reach out if any other neurological concerns or questions arise  Lesleigh Noe MD-PhD [facility] [phone] Available 7 AM to 7 PM, outside these hours please contact Neurologist on call listed on AMION

## 2022-08-26 NOTE — Progress Notes (Signed)
  Inpatient Rehab Admissions Coordinator :  Per therapy recommendations, patient was screened for CIR candidacy by Danne Baxter RN MSN. Noted under observation status. Await medical plan clarification to better define whether he would be considered medically neccessary for hospital level rehab /admission. I will follow up tomorrow. Please contact me with any questions.  Danne Baxter RN MSN Admissions Coordinator [phone]

## 2022-08-26 NOTE — Evaluation (Signed)
Physical Therapy Evaluation Patient Details Name: Maurice Cruz MRN: [id number] DOB: [birth year] Today's Date: 08/26/2022  History of Present Illness  Pt is 56 yo male presenting on 08/25/22 with worsening LE weakness.  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 He has chronic LE weakness, neurogenic bladder and bowel. Current MRI shows what appears to be chronic hemorrhage T8-T10.  Neurosurgery evaluated and reports no intervention from them needed. Neurology is following.  No clear explanation for progression of weakness at this point.  Does have AKI possibly as factor.  Pt with other hx including anxiety, depression, GERD, and HTN.  Clinical Impression  Pt admitted with above diagnosis. Pt with bil LE weakness at baseline but is normally able to ambulate well with RW and reports very rare spasms.  He is typically independent with adls and light iadls.  Today, pt presenting with further LE weakness.  After his initial injury/illness in 2021 his L LE is the stronger of the 2 legs but now both LE weaker (L still stronger than R).  Pt also with frequent spasms during session.  He required mod A to stabilize when OOB and only able to take a few steps with L leg feeling like it would buckle. L knee does have mild edema/erythema medially (notified MD via secure chat), all stability testing during therapy equal to R/normal .  At this point recommend further rehab prior to return home.  Pt currently with functional limitations due to the deficits listed below (see PT Problem List). Pt will benefit from skilled PT to increase their independence and safety with mobility to allow discharge to the venue listed below.          Recommendations for follow up therapy are one component of a multi-disciplinary discharge planning process, led by the attending physician.  Recommendations may be updated based on patient status, additional functional criteria and  insurance authorization.  Follow Up Recommendations Acute inpatient rehab (3hours/day)      Assistance Recommended at Discharge Frequent or constant Supervision/Assistance  Patient can return home with the following  A lot of help with walking and/or transfers;A lot of help with bathing/dressing/bathroom;Assistance with cooking/housework;Help with stairs or ramp for entrance    Equipment Recommendations None recommended by PT  Recommendations for Other Services  Rehab consult    Functional Status Assessment Patient has had a recent decline in their functional status and demonstrates the ability to make significant improvements in function in a reasonable and predictable amount of time.     Precautions / Restrictions Precautions Precautions: Fall      Mobility  Bed Mobility Overal bed mobility: Needs Assistance Bed Mobility: Supine to Sit, Sit to Supine     Supine to sit: Min assist Sit to supine: Min assist        Transfers Overall transfer level: Needs assistance Equipment used: Rolling walker (2 wheels) Transfers: Sit to/from Stand Sit to Stand: Mod assist           General transfer comment: Mod A to stabilize with standing due to spasms    Ambulation/Gait Ambulation/Gait assistance: Min assist Gait Distance (Feet): 4 Feet Assistive device: Rolling walker (2 wheels) Gait Pattern/deviations: Step-to pattern, Decreased stride length, Decreased dorsiflexion - left, Decreased dorsiflexion - right, Shuffle Gait velocity: decreased     General Gait Details: Pt reporting L knee felt like it would buckle; heavy reliance on bil UE; pt sliding feet forward; limited distance due to weakness  and spasms  Stairs            Wheelchair Mobility    Modified Rankin (Stroke Patients Only)       Balance Overall balance assessment: Needs assistance Sitting-balance support: No upper extremity supported Sitting balance-Leahy Scale: Good     Standing balance  support: Bilateral upper extremity supported, Reliant on assistive device for balance Standing balance-Leahy Scale: Poor Standing balance comment: RW and min - mod A                             Pertinent Vitals/Pain Pain Assessment Pain Assessment: No/denies pain    Home Living Family/patient expects to be discharged to:: Private residence Living Arrangements: Parent Available Help at Discharge: Family;Available 24 hours/day Type of Home: House Home Access: Stairs to enter   Entrance Stairs-Number of Steps: 1 Alternate Level Stairs-Number of Steps: has stair lifts on all steps Home Layout: Multi-level Home Equipment: Conservation officer, nature (2 wheels);Wheelchair - manual;Tub bench Additional Comments: stair lifts, doesn't have to use tub bench    Prior Function Prior Level of Function : Independent/Modified Independent             Mobility Comments: ambulates with RW for short community distances but if longer will take w/c ADLs Comments: independent with adls and light iadls     Hand Dominance        Extremity/Trunk Assessment   Upper Extremity Assessment Upper Extremity Assessment: Overall WFL for tasks assessed    Lower Extremity Assessment Lower Extremity Assessment: RLE deficits/detail;LLE deficits/detail RLE Deficits / Details: Weaker at his baseline compared to L.  Now: ROM WFL, MMT: ankle DF 5/5, knee ext 4/5, hip flexion 1/5 (reports that is fairly normal for that hip) LLE Deficits / Details: Typically his stronger leg; Now : ROM WFL; MMT: ankle DF 5/5, knee ext 4/5, hip flexion 3/5.  Noted very mild edema and erythema medial knee.  Pt reports mildly tender over medial tibia plateau when palpated    Cervical / Trunk Assessment Cervical / Trunk Assessment: Normal  Communication   Communication: No difficulties  Cognition Arousal/Alertness: Awake/alert Behavior During Therapy: WFL for tasks assessed/performed Overall Cognitive Status: Within Functional  Limits for tasks assessed                                 General Comments: Pt tearful over progression of his weakness and no known cause        General Comments General comments (skin integrity, edema, etc.): Pt have bil LE spasms tremors at rest and with initial standing    Exercises     Assessment/Plan    PT Assessment Patient needs continued PT services  PT Problem List Decreased strength;Impaired tone;Decreased activity tolerance;Decreased balance;Decreased mobility;Decreased coordination       PT Treatment Interventions DME instruction;Modalities;Gait training;Balance training;Neuromuscular re-education;Stair training;Functional mobility training;Patient/family education;Therapeutic activities;Therapeutic exercise;Wheelchair mobility training;Manual techniques    PT Goals (Current goals can be found in the Care Plan section)  Acute Rehab PT Goals Patient Stated Goal: figure out what is causing weakness PT Goal Formulation: With patient Time For Goal Achievement: 09/09/22 Potential to Achieve Goals: Good    Frequency Min 3X/week     Co-evaluation               AM-PAC PT "6 Clicks" Mobility  Outcome Measure Help needed turning from your back to your side  while in a flat bed without using bedrails?: A Little Help needed moving from lying on your back to sitting on the side of a flat bed without using bedrails?: A Little Help needed moving to and from a bed to a chair (including a wheelchair)?: A Lot Help needed standing up from a chair using your arms (e.g., wheelchair or bedside chair)?: A Lot Help needed to walk in hospital room?: Total Help needed climbing 3-5 steps with a railing? : Total 6 Click Score: 12    End of Session Equipment Utilized During Treatment: Gait belt Activity Tolerance: Patient tolerated treatment well Patient left: in bed;with call bell/phone within reach;with family/visitor present Nurse Communication: Mobility  status PT Visit Diagnosis: Other abnormalities of gait and mobility (R26.89);Muscle weakness (generalized) (M62.81)    Time: 4196-2229 PT Time Calculation (min) (ACUTE ONLY): 32 min   Charges:   PT Evaluation $PT Eval Moderate Complexity: 1 Mod PT Treatments $Gait Training: 8-22 mins        Abran Richard, PT Acute Rehab [facility] 08/26/2022, 3:45 PM

## 2022-08-26 NOTE — Progress Notes (Signed)
Lower extremity venous has been completed.   Preliminary results in CV Proc.   Maurice Cruz Karol Skarzynski 08/26/2022 9:57 AM

## 2022-08-26 NOTE — ED Notes (Signed)
ED TO INPATIENT HANDOFF REPORT  ED Nurse Name and Phone #: Lenice Pressman Name/Age/Gender Maurice Cruz 56 y.o. male Room/Bed: 004C/004C  Code Status   Code Status: Full Code  Home/SNF/Other Home Patient oriented to: self, place, time, and situation Is this baseline? Yes   Triage Complete: Triage complete  Chief Complaint Leg weakness [R29.898]  Triage Note Patient here with left leg weakness x 2 days. Had transverse myelitis and was originally in [facility]. Now with rehab up and moving with walker. Now x 2 days back to WC. Left lower leg warm , no redness but mild swelling noted   Allergies No Known Allergies  Level of Care/Admitting Diagnosis ED Disposition     ED Disposition  Admit   Condition  --   Comment  Hospital Area: [facility] [[id number]]  Level of Care: Med-Surg [16]  May place patient in observation at [facility] or [facility] if equivalent level of care is available:: Yes  Covid Evaluation: Asymptomatic - no recent exposure (last 10 days) testing not required  Diagnosis: Leg weakness [342752]  Admitting Physician: Shela Leff [[id number]]  Attending Physician: Shela Leff [[id number]]          B Medical/Surgery History Past Medical History:  Diagnosis Date   Anxiety    Asthma    as a child   Depression    ED (erectile dysfunction)    GERD (gastroesophageal reflux disease)    Hx of spinal cord injury 03/2020   Hypertension    Neurogenic bladder    self caths   Neurogenic bowel    has to be digitially stimulated - 04/24/21   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[place])    Past Surgical History:  Procedure Laterality Date   Anorectal biospy  02/09/2021   Colon Biospy  02/09/2021   IR ANGIO INTRA EXTRACRAN SEL COM CAROTID INNOMINATE BILAT MOD SED  04/25/2021   IR ANGIO VERTEBRAL SEL SUBCLAVIAN INNOMINATE UNI L MOD SED  04/25/2021   IR ANGIO VERTEBRAL SEL VERTEBRAL UNI R MOD SED  04/25/2021   IR  ANGIO/SPINAL LEFT  04/25/2021   IR ANGIO/SPINAL LEFT  04/25/2021   IR ANGIO/SPINAL LEFT  04/25/2021   IR ANGIO/SPINAL LEFT  04/25/2021   IR ANGIO/SPINAL LEFT  04/25/2021   IR ANGIO/SPINAL LEFT  04/25/2021   IR ANGIO/SPINAL LEFT  04/25/2021   IR ANGIO/SPINAL LEFT  04/25/2021   IR ANGIO/SPINAL LEFT  04/25/2021   IR ANGIO/SPINAL LEFT  04/25/2021   IR ANGIO/SPINAL LEFT  04/25/2021   IR ANGIO/SPINAL LEFT  04/25/2021   IR ANGIO/SPINAL LEFT  04/25/2021   IR ANGIO/SPINAL RIGHT  04/25/2021   IR ANGIO/SPINAL RIGHT  04/25/2021   IR ANGIO/SPINAL RIGHT  04/25/2021   IR ANGIO/SPINAL RIGHT  04/25/2021   IR ANGIO/SPINAL RIGHT  04/25/2021   IR ANGIO/SPINAL RIGHT  04/25/2021   IR ANGIO/SPINAL RIGHT  04/25/2021   IR ANGIO/SPINAL RIGHT  04/25/2021   IR ANGIO/SPINAL RIGHT  04/25/2021   IR ANGIO/SPINAL RIGHT  04/25/2021   IR ANGIO/SPINAL RIGHT  04/25/2021   IR ANGIO/SPINAL RIGHT  04/25/2021   IR ANGIO/SPINAL RIGHT  04/25/2021   IR ANGIO/SPINAL RIGHT  04/25/2021   IR ANGIOGRAM EXTREMITY BILATERAL  04/25/2021   IR RADIOLOGIST EVAL & MGMT  03/21/2021   RADIOLOGY WITH ANESTHESIA N/A 04/25/2021   Procedure: IR WITH ANESTHESIA SPINAL ANGIOGRAM;  Surgeon: Luanne Bras, MD;  Location: [place];  Service: Radiology;  Laterality: N/A;  A IV Location/Drains/Wounds Patient Lines/Drains/Airways Status     Active Line/Drains/Airways     Name Placement date Placement time Site Days   Arterial Line 04/25/21 Left Radial 04/25/21  0815  Radial  488   Peripheral IV 08/25/22 20 G Anterior;Distal;Right;Upper Arm 08/25/22  1811  Arm  1            Intake/Output Last 24 hours  Intake/Output Summary (Last 24 hours) at 08/26/2022 1802 Last data filed at 08/26/2022 1121 Gross per 24 hour  Intake 1452.14 ml  Output 450 ml  Net 1002.14 ml    Labs/Imaging Results for orders placed or performed during the hospital encounter of 08/25/22 (from the past 48 hour(s))  CBC with Differential     Status: Abnormal   Collection Time:  08/25/22  4:25 PM  Result Value Ref Range   WBC 10.0 4.0 - 10.5 K/uL   RBC 4.13 (L) 4.22 - 5.81 MIL/uL   Hemoglobin 11.5 (L) 13.0 - 17.0 g/dL   HCT 35.1 (L) 39.0 - 52.0 %   MCV 85.0 80.0 - 100.0 fL   MCH 27.8 26.0 - 34.0 pg   MCHC 32.8 30.0 - 36.0 g/dL   RDW 13.3 11.5 - 15.5 %   Platelets 337 150 - 400 K/uL   nRBC 0.0 0.0 - 0.2 %   Neutrophils Relative % 63 %   Neutro Abs 6.4 1.7 - 7.7 K/uL   Lymphocytes Relative 21 %   Lymphs Abs 2.1 0.7 - 4.0 K/uL   Monocytes Relative 11 %   Monocytes Absolute 1.0 0.1 - 1.0 K/uL   Eosinophils Relative 4 %   Eosinophils Absolute 0.4 0.0 - 0.5 K/uL   Basophils Relative 0 %   Basophils Absolute 0.0 0.0 - 0.1 K/uL   Immature Granulocytes 1 %   Abs Immature Granulocytes 0.05 0.00 - 0.07 K/uL    Comment: Performed at [facility], [address] [place], [place] [address]  Basic metabolic panel     Status: Abnormal   Collection Time: 08/25/22  4:25 PM  Result Value Ref Range   Sodium 136 135 - 145 mmol/L   Potassium 5.1 3.5 - 5.1 mmol/L   Chloride 99 98 - 111 mmol/L   CO2 24 22 - 32 mmol/L   Glucose, Bld 103 (H) 70 - 99 mg/dL    Comment: Glucose reference range applies only to samples taken after fasting for at least 8 hours.   BUN 32 (H) 6 - 20 mg/dL   Creatinine, Ser 2.75 (H) 0.61 - 1.24 mg/dL   Calcium 9.4 8.9 - 10.3 mg/dL   GFR, Estimated 27 (L) >60 mL/min    Comment: (NOTE) Calculated using the CKD-EPI Creatinine Equation (2021)    Anion gap 13 5 - 15    Comment: Performed at [facility] [address] [place], [place] [address]  Sedimentation rate     Status: Abnormal   Collection Time: 08/25/22  4:25 PM  Result Value Ref Range   Sed Rate 98 (H) 0 - 16 mm/hr    Comment: Performed at [facility] [address] [place], [place] [address]  C-reactive protein     Status: Abnormal   Collection Time: 08/25/22  4:25 PM  Result Value Ref Range   CRP 9.5 (H) <1.0 mg/dL    Comment: Performed at [facility], [place] [address] [place], [place] [address]  Urinalysis, Routine w reflex microscopic Urine, Clean Catch  Status: Abnormal   Collection Time: 08/25/22  9:18 PM  Result Value Ref Range   Color, Urine YELLOW YELLOW   APPearance CLEAR CLEAR   Specific Gravity, Urine 1.008 1.005 - 1.030   pH 5.0 5.0 - 8.0   Glucose, UA NEGATIVE NEGATIVE mg/dL   Hgb urine dipstick NEGATIVE NEGATIVE   Bilirubin Urine NEGATIVE NEGATIVE   Ketones, ur NEGATIVE NEGATIVE mg/dL   Protein, ur NEGATIVE NEGATIVE mg/dL   Nitrite NEGATIVE NEGATIVE   Leukocytes,Ua TRACE (A) NEGATIVE   RBC / HPF 0-5 0 - 5 RBC/hpf   WBC, UA 6-10 0 - 5 WBC/hpf   Bacteria, UA RARE (A) NONE SEEN   Squamous Epithelial / LPF 0-5 0 - 5   Mucus PRESENT    Hyaline Casts, UA PRESENT     Comment: Performed at [facility], [address], [place], [place] [address]  HIV Antibody (routine testing w rflx)     Status: None   Collection Time: 08/26/22 12:50 AM  Result Value Ref Range   HIV Screen 4th Generation wRfx Non Reactive Non Reactive    Comment: Performed at [facility], [place] [address] [place], [place] [address]  Brain natriuretic peptide     Status: None   Collection Time: 08/26/22  5:09 AM  Result Value Ref Range   B Natriuretic Peptide 11.1 0.0 - 100.0 pg/mL    Comment: Performed at [facility] [address] [place], [place] [address]  Basic metabolic panel     Status: Abnormal   Collection Time: 08/26/22  5:09 AM  Result Value Ref Range   Sodium 139 135 - 145 mmol/L   Potassium 4.8 3.5 - 5.1 mmol/L   Chloride 107 98 - 111 mmol/L   CO2 21 (L) 22 - 32 mmol/L   Glucose, Bld 125 (H) 70 - 99 mg/dL    Comment: Glucose reference range applies only to samples taken after fasting for at least 8 hours.   BUN 33 (H) 6 - 20 mg/dL   Creatinine, Ser 2.63 (H) 0.61 - 1.24 mg/dL   Calcium 8.6 (L) 8.9 - 10.3 mg/dL   GFR, Estimated 28 (L) >60 mL/min    Comment: (NOTE) Calculated using the CKD-EPI Creatinine Equation  (2021)    Anion gap 11 5 - 15    Comment: Performed at [facility] [address] [place], [place] [address]  Vitamin B12     Status: Abnormal   Collection Time: 08/26/22  5:09 AM  Result Value Ref Range   Vitamin B-12 1,004 (H) 180 - 914 pg/mL    Comment: HEMOLYSIS AT THIS LEVEL MAY AFFECT RESULT (NOTE) This assay is not validated for testing neonatal or myeloproliferative syndrome specimens for Vitamin B12 levels. Performed at [facility], [place] [address] [place], [place] [address]   Iron and TIBC     Status: Abnormal   Collection Time: 08/26/22  5:09 AM  Result Value Ref Range   Iron 46 45 - 182 ug/dL    Comment: HEMOLYSIS AT THIS LEVEL MAY AFFECT RESULT   TIBC 223 (L) 250 - 450 ug/dL   Saturation Ratios 21 17.9 - 39.5 %   UIBC 177 ug/dL    Comment: Performed at [facility], [place] [address], [place], [place] [address]  Ferritin     Status: None   Collection Time: 08/26/22  5:09 AM  Result Value Ref Range   Ferritin 117 24 - 336  ng/mL    Comment: Performed at [facility], [place] [address], [place], [place] [address]  Reticulocytes     Status: Abnormal   Collection Time: 08/26/22  5:09 AM  Result Value Ref Range   Retic Ct Pct 1.1 0.4 - 3.1 %   RBC. 3.43 (L) 4.22 - 5.81 MIL/uL   Retic Count, Absolute 38.4 19.0 - 186.0 K/uL   Immature Retic Fract 11.7 2.3 - 15.9 %    Comment: Performed at [facility] [address] [place], [place] [address]  RPR     Status: None   Collection Time: 08/26/22  5:09 AM  Result Value Ref Range   RPR Ser Ql NON REACTIVE NON REACTIVE    Comment: Performed at [facility], [place] [address], [place], [place] [address]  Folate     Status: None   Collection Time: 08/26/22  5:09 AM  Result Value Ref Range   Folate 29.9 >5.9 ng/mL    Comment: Performed at [facility] [address] [place], [place] [address]  TSH     Status: None   Collection Time: 08/26/22  5:09 AM  Result Value Ref Range   TSH  0.872 0.350 - 4.500 uIU/mL    Comment: Performed by a 3rd Generation assay with a functional sensitivity of <=0.01 uIU/mL. Performed at [facility], [place] [address], [place], [place] [address]    DG Knee 1-2 Views Left  Result Date: 08/26/2022 CLINICAL DATA:  Left leg weakness for 2 days. Had transverse myelitis and after rehab advanced from wheelchair to moving with walker. EXAM: LEFT KNEE - 1-2 VIEW COMPARISON:  None Available. FINDINGS: Normal bone mineralization. Joint spaces are preserved. No joint effusion. No acute fracture or dislocation. IMPRESSION: No significant abnormality. Electronically Signed   By: Yvonne Kendall M.D.   On: 08/26/2022 16:26   VAS [place] LOWER EXTREMITY VENOUS (DVT) (7a-7p)  Result Date: 08/26/2022  Lower Venous DVT Study Patient Name:  Maurice Cruz  Date of Exam:   08/26/2022 Medical Rec #: [id number]      Accession #:    [id number] Date of Birth: 24-Jan-1966     Patient Gender: M Patient Age:   11 years Exam Location:  [facility] Procedure:      VAS [place] LOWER EXTREMITY VENOUS (DVT) Referring Phys: Wandra Feinstein RATHORE --------------------------------------------------------------------------------  Indications: Pain.  Comparison Study: no prior Performing Technologist: Archie Patten RVS  Examination Guidelines: A complete evaluation includes B-mode imaging, spectral Doppler, color Doppler, and power Doppler as needed of all accessible portions of each vessel. Bilateral testing is considered an integral part of a complete examination. Limited examinations for reoccurring indications may be performed as noted. The reflux portion of the exam is performed with the patient in reverse Trendelenburg.  +---------+---------------+---------+-----------+----------+--------------+ RIGHT    CompressibilityPhasicitySpontaneityPropertiesThrombus Aging +---------+---------------+---------+-----------+----------+--------------+ CFV      Full           Yes      Yes                                  +---------+---------------+---------+-----------+----------+--------------+ SFJ      Full                                                        +---------+---------------+---------+-----------+----------+--------------+  FV Prox  Full                                                        +---------+---------------+---------+-----------+----------+--------------+ FV Mid   Full                                                        +---------+---------------+---------+-----------+----------+--------------+ FV DistalFull                                                        +---------+---------------+---------+-----------+----------+--------------+ PFV      Full                                                        +---------+---------------+---------+-----------+----------+--------------+ POP      Full           Yes      Yes                                 +---------+---------------+---------+-----------+----------+--------------+ PTV      Full                                                        +---------+---------------+---------+-----------+----------+--------------+ PERO     Full           Yes      Yes                                 +---------+---------------+---------+-----------+----------+--------------+   +----+---------------+---------+-----------+----------+--------------+ LEFTCompressibilityPhasicitySpontaneityPropertiesThrombus Aging +----+---------------+---------+-----------+----------+--------------+ CFV Full           Yes      Yes                                 +----+---------------+---------+-----------+----------+--------------+     Summary: RIGHT: - There is no evidence of deep vein thrombosis in the lower extremity.  - No cystic structure found in the popliteal fossa.  LEFT: - No evidence of common femoral vein obstruction.  *See table(s) above for measurements and observations. Electronically  signed by Monica Martinez MD on 08/26/2022 at 12:41:31 PM.    Final    US RENAL  Result Date: 08/26/2022 CLINICAL DATA:  Acute renal injury EXAM: RENAL / URINARY TRACT ULTRASOUND COMPLETE COMPARISON:  None Available. FINDINGS: Right Kidney: Renal measurements: 11.9 x 4.2 x 5.9 cm. = volume: 154 mL. Echogenicity within normal limits. No mass or hydronephrosis visualized. Left Kidney: Renal measurements: 13.7 x 6.0 x 5.6 cm. =  volume: 240 mL. Echogenicity within normal limits. No mass or hydronephrosis visualized. Bladder: Appears normal for degree of bladder distention. Other: None. IMPRESSION: Normal appearing kidneys bilaterally. Electronically Signed   By: Inez Catalina M.D.   On: 08/26/2022 01:30   MR Cervical Spine W or Wo Contrast  Addendum Date: 08/25/2022   ADDENDUM REPORT: 08/25/2022 23:30 ADDENDUM: Additional comparison is made to MRI thoracic spine 03/14/2021. The T1 and T2 hypointense signal in the spinal cord posterior to T9 appears unchanged compared to 2022. These findings were discussed by telephone on 08/25/2022 at 11:19 pm with provider Khaliqdina. Electronically Signed   By: Merilyn Baba M.D.   On: 08/25/2022 23:30   Result Date: 08/25/2022 CLINICAL DATA:  Ataxia, cervical or thoracic pathology suspected EXAM: MRI CERVICAL AND THORACIC SPINE WITHOUT AND WITH CONTRAST TECHNIQUE: Multiplanar and multiecho pulse sequences of the cervical spine, to include the craniocervical junction and cervicothoracic junction, and the thoracic spine, were obtained without and with intravenous contrast. CONTRAST:  7.54mL GADAVIST GADOBUTROL 1 MMOL/ML IV SOLN COMPARISON:  MRI cervical and thoracic spine 04/15/2020 FINDINGS: MRI CERVICAL SPINE FINDINGS Alignment: Straightening of the normal cervical lordosis. Mild dextrocurvature. No listhesis. Vertebrae: No fracture, evidence of discitis, or bone lesion. Diffusely decreased marrow signal. No abnormal enhancement. Cord: Normal signal and morphology.  No  abnormal enhancement. Posterior Fossa, vertebral arteries, paraspinal tissues: Negative. Disc levels: Preserved disc hydration without significant disc bulge. No spinal canal stenosis or neural foraminal narrowing in the cervical spine. MRI THORACIC SPINE FINDINGS Alignment: S shaped curvature of the thoracolumbar spine. No significant listhesis. Vertebrae: No fracture, evidence of discitis, or bone lesion. No abnormal enhancement. Cord: The cord is normal in morphology. On susceptibility weighted imaging, there is hypointense signal in the anterior spinal cord at T8-T9 to T9-T10 (series 14, images 25-30). This is associated with hypointense signal on T1 and T2 weighted sequences, most likely chronic hemorrhage, which correlates with an area of enhancement on the prior MRI. No abnormal enhancement or increased T2 hyperintense signal on the current study. Paraspinal and other soft tissues: Small right-greater-than-left pleural effusions. Disc levels: No significant spinal canal stenosis or neural foraminal narrowing. IMPRESSION: 1. 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 No abnormal enhancement or increased T2 hyperintense signal on the current study; abnormal signal in the adjacent cord on the prior exam could have reflected edema related to the hemorrhage. 2. Diffusely decreased marrow signal, which is nonspecific but can be seen in the setting of anemia, obesity or smoking. 3. Small right-greater-than-left pleural effusions. 4. No spinal canal stenosis or neural foraminal narrowing in the cervical or thoracic spine. Electronically Signed: By: Merilyn Baba M.D. On: 08/25/2022 21:23   MR THORACIC SPINE W WO CONTRAST  Addendum Date: 08/25/2022   ADDENDUM REPORT: 08/25/2022 23:30 ADDENDUM: Additional comparison is  made to MRI thoracic spine 03/14/2021. The T1 and T2 hypointense signal in the spinal cord posterior to T9 appears unchanged compared to 2022. These findings were discussed by telephone on 08/25/2022 at 11:19 pm with provider Khaliqdina. Electronically Signed   By: Merilyn Baba M.D.   On: 08/25/2022 23:30   Result Date: 08/25/2022 CLINICAL DATA:  Ataxia, cervical or thoracic pathology suspected EXAM: MRI CERVICAL AND THORACIC SPINE WITHOUT AND WITH CONTRAST TECHNIQUE: Multiplanar and multiecho pulse sequences of the cervical  spine, to include the craniocervical junction and cervicothoracic junction, and the thoracic spine, were obtained without and with intravenous contrast. CONTRAST:  7.70mL GADAVIST GADOBUTROL 1 MMOL/ML IV SOLN COMPARISON:  MRI cervical and thoracic spine 04/15/2020 FINDINGS: MRI CERVICAL SPINE FINDINGS Alignment: Straightening of the normal cervical lordosis. Mild dextrocurvature. No listhesis. Vertebrae: No fracture, evidence of discitis, or bone lesion. Diffusely decreased marrow signal. No abnormal enhancement. Cord: Normal signal and morphology.  No abnormal enhancement. Posterior Fossa, vertebral arteries, paraspinal tissues: Negative. Disc levels: Preserved disc hydration without significant disc bulge. No spinal canal stenosis or neural foraminal narrowing in the cervical spine. MRI THORACIC SPINE FINDINGS Alignment: S shaped curvature of the thoracolumbar spine. No significant listhesis. Vertebrae: No fracture, evidence of discitis, or bone lesion. No abnormal enhancement. Cord: The cord is normal in morphology. On susceptibility weighted imaging, there is hypointense signal in the anterior spinal cord at T8-T9 to T9-T10 (series 14, images 25-30). This is associated with hypointense signal on T1 and T2 weighted sequences, most likely chronic hemorrhage, which correlates with an area of enhancement on the prior MRI. No abnormal enhancement or increased T2 hyperintense signal on the  current study. Paraspinal and other soft tissues: Small right-greater-than-left pleural effusions. Disc levels: No significant spinal canal stenosis or neural foraminal narrowing. IMPRESSION: 1. 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 No abnormal enhancement or increased T2 hyperintense signal on the current study; abnormal signal in the adjacent cord on the prior exam could have reflected edema related to the hemorrhage. 2. Diffusely decreased marrow signal, which is nonspecific but can be seen in the setting of anemia, obesity or smoking. 3. Small right-greater-than-left pleural effusions. 4. No spinal canal stenosis or neural foraminal narrowing in the cervical or thoracic spine. Electronically Signed: By: Merilyn Baba M.D. On: 08/25/2022 21:23   MR Lumbar Spine W Wo Contrast  Result Date: 08/25/2022 CLINICAL DATA:  Low back pain, cauda equina syndrome suspected EXAM: MRI LUMBAR SPINE WITHOUT AND WITH CONTRAST TECHNIQUE: Multiplanar and multiecho pulse sequences of the lumbar spine were obtained without and with intravenous contrast. CONTRAST:  7.26mL GADAVIST GADOBUTROL 1 MMOL/ML IV SOLN COMPARISON:  MRI lumbar spine 04/15/2020 FINDINGS: Segmentation: 5 lumbar type vertebral bodies. Partial sacralization of S1, with the last fully formed disc space at S1-S2. Alignment:  Mild dextrocurvature.  No listhesis. Vertebrae: No fracture, evidence of discitis, or bone lesion. No abnormal enhancement. Conus medullaris and cauda equina: Conus extends to the L1-L2 level. Conus and cauda equina appear normal. No abnormal enhancement. Paraspinal and other soft tissues: Small renal cysts, for which no follow-up is indicated. Normal muscle bulk and signal. Disc levels: T12-L1: No significant disc bulge. No spinal canal  stenosis or neural foraminal narrowing. L1-L2: No significant disc bulge. No spinal canal stenosis or neural foraminal narrowing. L2-L3: No significant disc bulge. No spinal canal stenosis or neural foraminal narrowing. L3-L4: No significant disc bulge. No spinal canal stenosis or neural foraminal narrowing. L4-L5: No significant disc bulge. No spinal canal stenosis or neural foraminal narrowing. L5-S1: Minimal disc bulge. Mild facet arthropathy. No spinal canal stenosis or neural foraminal narrowing. IMPRESSION: No spinal canal stenosis or neural foraminal narrowing. No abnormal enhancement. No evidence of cauda equina syndrome. Electronically Signed   By: Merilyn Baba M.D.   On: 08/25/2022 20:29  Pending Labs Unresulted Labs (From admission, onward)    None       Vitals/Pain Today's Vitals   08/26/22 1113 08/26/22 1130 08/26/22 1345 08/26/22 1544  BP: 111/74 115/68 115/75   Pulse:  79 78   Resp:  18 18   Temp:  98.9 F (37.2 C)  98.3 F (36.8 C)  TempSrc:  Oral  Oral  SpO2:  100% 100%   PainSc:        Isolation Precautions No active isolations  Medications Medications  amLODipine (NORVASC) tablet 10 mg (10 mg Oral Given 08/26/22 1113)  carvedilol (COREG) tablet 25 mg (25 mg Oral Not Given 08/26/22 0902)  rosuvastatin (CRESTOR) tablet 5 mg (5 mg Oral Given 08/26/22 1112)  diazepam (VALIUM) tablet 2 mg (2 mg Oral Given 08/26/22 0044)  pantoprazole (PROTONIX) EC tablet 40 mg (40 mg Oral Given 08/26/22 1112)  tiZANidine (ZANAFLEX) tablet 2 mg (2 mg Oral Given 08/26/22 0044)  acetaminophen (TYLENOL) tablet 650 mg (has no administration in time range)    Or  acetaminophen (TYLENOL) suppository 650 mg (has no administration in time range)  0.9 %  sodium chloride infusion ( Intravenous New Bag/Given 08/26/22 1112)  polyethylene glycol (MIRALAX / GLYCOLAX) packet 17 g (has no administration in time range)  0.9 %  sodium chloride infusion (has no administration in time range)   LORazepam (ATIVAN) injection 1 mg (1 mg Intravenous Given 08/25/22 1811)  gadobutrol (GADAVIST) 1 MMOL/ML injection 7.5 mL (7.5 mLs Intravenous Contrast Given 08/25/22 2007)  sodium chloride 0.9 % bolus 1,000 mL (0 mLs Intravenous Stopped 08/26/22 0240)    Mobility manual wheelchair Moderate fall risk   Focused Assessments     R Recommendations: See Admitting Provider Note  Report given to:   Additional Notes:

## 2022-08-26 NOTE — Progress Notes (Addendum)
PROGRESS NOTE    Maurice Cruz  FBP:[id number] DOB: Jan 21, 1966 DOA: 08/25/2022 PCP: Glendon Axe, MD  Outpatient Specialists: neurology    Brief Narrative:   From admission h and p  Maurice Cruz is a 56 y.o. male with medical history significant of transverse myelitis and followed by Dr. [name] with neurology as outpatient since 2021.  He has completed extensive rehab with documented deficits including bilateral lower extremity weakness, neurogenic bladder does self-catheterization, neurogenic bowel, and use of walking cane or walker as needed.  Also has history of anxiety, depression, GERD, hypertension. He presents to the ED complaining of worsening left leg weakness x 2 days.  Labs showing hemoglobin 11.5, MCV 85.0, BUN 32, creatinine 2.7, ESR 98, CRP 9.5.   Assessment & Plan:   Principal Problem:   Leg weakness Active Problems:   Neurogenic bladder   Neurogenic bowel   AKI (acute kidney injury) (HCC)   Normocytic anemia   Left leg pain   Pleural effusion   Anxiety   GERD (gastroesophageal reflux disease)   Essential hypertension   Hyperlipidemia  # Left leg weakness # spinal cord hemorrhage # Paraplegia Patient with a history of spinal cord hemorrhage, baseline is able to stand and ambulate with a walker, coming in with subacute left leg weakness. MRI shows what appears to be chronic hemorrhage t8-t10. Neurosurg has evaluated and says nothing acute, no indication for intervention by them. Neurology is following, no clear explanation for this progression. - neurology following, will f/u today's recs when available - pt/ot consults  # Neurogenic bladder - straight cath prn  # AKI vs ckd stage 4 Cr one year ago was 1.2, here it is 2.6. Patient reports that his creatinine has been in the 2s at least since September. Urinalysis shows bland sediment. No hydronephrosis or other abnormality seen on renal u/s, though suspect this could be chronic obstructive uropathy. No nsaids  at home or other meds that would clearly explain things. Started on fluids overnight, will see if that helps things but patient reports normal PO and urine output, says he stays on top of cathing several times a day. Does have hx of htn. - continue fluids and trend - outpt nephrology referral  # Paraplegia - home tizanidine  # Neurogenic bowel - continue home rectal diazepam  # HTN Here bp controlled - cont home coreg, amlodipine  # Left knee/leg swelling Per patient report. Appears normal today. PVLs neg for DVT. Reported pain left knee when working with PT. - check x ray - monitor    DVT prophylaxis: SCDs Code Status: full Family Communication: mother and father updated @ bedside  Level of care: Med-Surg Status is: Observation    Consultants:  Neurosurgery, neurology  Procedures: none  Antimicrobials:  none    Subjective: Feeling fine  Objective: Vitals:   08/26/22 0915 08/26/22 1000 08/26/22 1113 08/26/22 1130  BP: 113/70 108/70 111/74 115/68  Pulse:    79  Resp:    18  Temp:    98.9 F (37.2 C)  TempSrc:    Oral  SpO2:    100%    Intake/Output Summary (Last 24 hours) at 08/26/2022 1305 Last data filed at 08/26/2022 1121 Gross per 24 hour  Intake 1452.14 ml  Output 450 ml  Net 1002.14 ml   There were no vitals filed for this visit.  Examination:  General exam: Appears calm and comfortable  Respiratory system: Clear to auscultation. Respiratory effort normal. Cardiovascular system: S1 & S2  heard, RRR. No JVD, murmurs, rubs, gallops or clicks. No pedal edema. Gastrointestinal system: Abdomen is nondistended, soft and nontender. No organomegaly or masses felt. Normal bowel sounds heard. Central nervous system: decreased strength b/l lower extremities Extremities: warm, no edema Skin: No rashes, lesions or ulcers Psychiatry: Judgement and insight appear normal. Mood & affect appropriate.     Data Reviewed: I have personally reviewed  following labs and imaging studies  CBC: Recent Labs  Lab 08/25/22 1625  WBC 10.0  NEUTROABS 6.4  HGB 11.5*  HCT 35.1*  MCV 85.0  PLT 938   Basic Metabolic Panel: Recent Labs  Lab 08/25/22 1625 08/26/22 0509  NA 136 139  K 5.1 4.8  CL 99 107  CO2 24 21*  GLUCOSE 103* 125*  BUN 32* 33*  CREATININE 2.75* 2.63*  CALCIUM 9.4 8.6*   GFR: CrCl cannot be calculated (Unknown ideal weight.). Liver Function Tests: No results for input(s): "AST", "ALT", "ALKPHOS", "BILITOT", "PROT", "ALBUMIN" in the last 168 hours. No results for input(s): "LIPASE", "AMYLASE" in the last 168 hours. No results for input(s): "AMMONIA" in the last 168 hours. Coagulation Profile: No results for input(s): "INR", "PROTIME" in the last 168 hours. Cardiac Enzymes: No results for input(s): "CKTOTAL", "CKMB", "CKMBINDEX", "TROPONINI" in the last 168 hours. BNP (last 3 results) No results for input(s): "PROBNP" in the last 8760 hours. HbA1C: No results for input(s): "HGBA1C" in the last 72 hours. CBG: No results for input(s): "GLUCAP" in the last 168 hours. Lipid Profile: No results for input(s): "CHOL", "HDL", "LDLCALC", "TRIG", "CHOLHDL", "LDLDIRECT" in the last 72 hours. Thyroid Function Tests: Recent Labs    08/26/22 0509  TSH 0.872   Anemia Panel: Recent Labs    08/26/22 0509  VITAMINB12 1,004*  FOLATE 29.9  FERRITIN 117  TIBC 223*  IRON 46  RETICCTPCT 1.1   Urine analysis:    Component Value Date/Time   COLORURINE YELLOW 08/25/2022 2118   APPEARANCEUR CLEAR 08/25/2022 2118   LABSPEC 1.008 08/25/2022 2118   PHURINE 5.0 08/25/2022 2118   GLUCOSEU NEGATIVE 08/25/2022 2118   HGBUR NEGATIVE 08/25/2022 2118   BILIRUBINUR NEGATIVE 08/25/2022 2118   KETONESUR NEGATIVE 08/25/2022 2118   PROTEINUR NEGATIVE 08/25/2022 2118   NITRITE NEGATIVE 08/25/2022 2118   LEUKOCYTESUR TRACE (A) 08/25/2022 2118   Sepsis Labs: _0 (procalcitonin:4,lacticidven:4)  )No results found for this  or any previous visit (from the past 240 hour(s)).       Radiology Studies: VAS [place] LOWER EXTREMITY VENOUS (DVT) (7a-7p)  Result Date: 08/26/2022  Lower Venous DVT Study Patient Name:  JAREB RADONCIC  Date of Exam:   08/26/2022 Medical Rec #: [id number]      Accession #:    [id number] Date of Birth: Jun 04, 1966     Patient Gender: M Patient Age:   30 years Exam Location[facility]  [facility] Procedure:      VAS [place] LOWER EXTREMITY VENOUS (DVT) Referring Phys: Wandra Feinstein RATHORE --------------------------------------------------------------------------------  Indications: Pain.  Comparison Study: no prior Performing Technologist: Archie Patten RVS  Examination Guidelines: A complete evaluation includes B-mode imaging, spectral Doppler, color Doppler, and power Doppler as needed of all accessible portions of each vessel. Bilateral testing is considered an integral part of a complete examination. Limited examinations for reoccurring indications may be performed as noted. The reflux portion of the exam is performed with the patient in reverse Trendelenburg.  +---------+---------------+---------+-----------+----------+--------------+ RIGHT    CompressibilityPhasicitySpontaneityPropertiesThrombus Aging +---------+---------------+---------+-----------+----------+--------------+ CFV      Full  Yes      Yes                                 +---------+---------------+---------+-----------+----------+--------------+ SFJ      Full                                                        +---------+---------------+---------+-----------+----------+--------------+ FV Prox  Full                                                        +---------+---------------+---------+-----------+----------+--------------+ FV Mid   Full                                                        +---------+---------------+---------+-----------+----------+--------------+ FV DistalFull                                                         +---------+---------------+---------+-----------+----------+--------------+ PFV      Full                                                        +---------+---------------+---------+-----------+----------+--------------+ POP      Full           Yes      Yes                                 +---------+---------------+---------+-----------+----------+--------------+ PTV      Full                                                        +---------+---------------+---------+-----------+----------+--------------+ PERO     Full           Yes      Yes                                 +---------+---------------+---------+-----------+----------+--------------+   +----+---------------+---------+-----------+----------+--------------+ LEFTCompressibilityPhasicitySpontaneityPropertiesThrombus Aging +----+---------------+---------+-----------+----------+--------------+ CFV Full           Yes      Yes                                 +----+---------------+---------+-----------+----------+--------------+     Summary: RIGHT: - There is no evidence of deep vein thrombosis in the lower extremity.  -  No cystic structure found in the popliteal fossa.  LEFT: - No evidence of common femoral vein obstruction.  *See table(s) above for measurements and observations. Electronically signed by Monica Martinez MD on 08/26/2022 at 12:41:31 PM.    Final    US RENAL  Result Date: 08/26/2022 CLINICAL DATA:  Acute renal injury EXAM: RENAL / URINARY TRACT ULTRASOUND COMPLETE COMPARISON:  None Available. FINDINGS: Right Kidney: Renal measurements: 11.9 x 4.2 x 5.9 cm. = volume: 154 mL. Echogenicity within normal limits. No mass or hydronephrosis visualized. Left Kidney: Renal measurements: 13.7 x 6.0 x 5.6 cm. = volume: 240 mL. Echogenicity within normal limits. No mass or hydronephrosis visualized. Bladder: Appears normal for degree of bladder distention. Other: None.  IMPRESSION: Normal appearing kidneys bilaterally. Electronically Signed   By: Inez Catalina M.D.   On: 08/26/2022 01:30   MR Cervical Spine W or Wo Contrast  Addendum Date: 08/25/2022   ADDENDUM REPORT: 08/25/2022 23:30 ADDENDUM: Additional comparison is made to MRI thoracic spine 03/14/2021. The T1 and T2 hypointense signal in the spinal cord posterior to T9 appears unchanged compared to 2022. These findings were discussed by telephone on 08/25/2022 at 11:19 pm with provider Khaliqdina. Electronically Signed   By: Merilyn Baba M.D.   On: 08/25/2022 23:30   Result Date: 08/25/2022 CLINICAL DATA:  Ataxia, cervical or thoracic pathology suspected EXAM: MRI CERVICAL AND THORACIC SPINE WITHOUT AND WITH CONTRAST TECHNIQUE: Multiplanar and multiecho pulse sequences of the cervical spine, to include the craniocervical junction and cervicothoracic junction, and the thoracic spine, were obtained without and with intravenous contrast. CONTRAST:  7.42m GADAVIST GADOBUTROL 1 MMOL/ML IV SOLN COMPARISON:  MRI cervical and thoracic spine 04/15/2020 FINDINGS: MRI CERVICAL SPINE FINDINGS Alignment: Straightening of the normal cervical lordosis. Mild dextrocurvature. No listhesis. Vertebrae: No fracture, evidence of discitis, or bone lesion. Diffusely decreased marrow signal. No abnormal enhancement. Cord: Normal signal and morphology.  No abnormal enhancement. Posterior Fossa, vertebral arteries, paraspinal tissues: Negative. Disc levels: Preserved disc hydration without significant disc bulge. No spinal canal stenosis or neural foraminal narrowing in the cervical spine. MRI THORACIC SPINE FINDINGS Alignment: S shaped curvature of the thoracolumbar spine. No significant listhesis. Vertebrae: No fracture, evidence of discitis, or bone lesion. No abnormal enhancement. Cord: The cord is normal in morphology. On susceptibility weighted imaging, there is hypointense signal in the anterior spinal cord at T8-T9 to T9-T10 (series  14, images 25-30). This is associated with hypointense signal on T1 and T2 weighted sequences, most likely chronic hemorrhage, which correlates with an area of enhancement on the prior MRI. No abnormal enhancement or increased T2 hyperintense signal on the current study. Paraspinal and other soft tissues: Small right-greater-than-left pleural effusions. Disc levels: No significant spinal canal stenosis or neural foraminal narrowing. IMPRESSION: 1. 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 No abnormal enhancement or increased T2 hyperintense signal on the current study; abnormal signal in the adjacent cord on the prior exam could have reflected edema related to the hemorrhage. 2. Diffusely decreased marrow signal, which is nonspecific but can be seen in the setting of anemia, obesity or smoking. 3. Small right-greater-than-left pleural effusions. 4. No spinal canal stenosis or neural foraminal narrowing in the cervical or thoracic spine. Electronically Signed: By: AMerilyn BabaM.D. On: 08/25/2022 21:23   MR THORACIC SPINE  W WO CONTRAST  Addendum Date: 08/25/2022   ADDENDUM REPORT: 08/25/2022 23:30 ADDENDUM: Additional comparison is made to MRI thoracic spine 03/14/2021. The T1 and T2 hypointense signal in the spinal cord posterior to T9 appears unchanged compared to 2022. These findings were discussed by telephone on 08/25/2022 at 11:19 pm with provider Khaliqdina. Electronically Signed   By: Merilyn Baba M.D.   On: 08/25/2022 23:30   Result Date: 08/25/2022 CLINICAL DATA:  Ataxia, cervical or thoracic pathology suspected EXAM: MRI CERVICAL AND THORACIC SPINE WITHOUT AND WITH CONTRAST TECHNIQUE: Multiplanar and multiecho pulse sequences of the cervical spine, to include the craniocervical junction and  cervicothoracic junction, and the thoracic spine, were obtained without and with intravenous contrast. CONTRAST:  7.52m GADAVIST GADOBUTROL 1 MMOL/ML IV SOLN COMPARISON:  MRI cervical and thoracic spine 04/15/2020 FINDINGS: MRI CERVICAL SPINE FINDINGS Alignment: Straightening of the normal cervical lordosis. Mild dextrocurvature. No listhesis. Vertebrae: No fracture, evidence of discitis, or bone lesion. Diffusely decreased marrow signal. No abnormal enhancement. Cord: Normal signal and morphology.  No abnormal enhancement. Posterior Fossa, vertebral arteries, paraspinal tissues: Negative. Disc levels: Preserved disc hydration without significant disc bulge. No spinal canal stenosis or neural foraminal narrowing in the cervical spine. MRI THORACIC SPINE FINDINGS Alignment: S shaped curvature of the thoracolumbar spine. No significant listhesis. Vertebrae: No fracture, evidence of discitis, or bone lesion. No abnormal enhancement. Cord: The cord is normal in morphology. On susceptibility weighted imaging, there is hypointense signal in the anterior spinal cord at T8-T9 to T9-T10 (series 14, images 25-30). This is associated with hypointense signal on T1 and T2 weighted sequences, most likely chronic hemorrhage, which correlates with an area of enhancement on the prior MRI. No abnormal enhancement or increased T2 hyperintense signal on the current study. Paraspinal and other soft tissues: Small right-greater-than-left pleural effusions. Disc levels: No significant spinal canal stenosis or neural foraminal narrowing. IMPRESSION: 1. 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 No abnormal enhancement or increased T2 hyperintense signal on the current study; abnormal signal in the adjacent cord on the prior exam could have  reflected edema related to the hemorrhage. 2. Diffusely decreased marrow signal, which is nonspecific but can be seen in the setting of anemia, obesity or smoking. 3. Small right-greater-than-left pleural effusions. 4. No spinal canal stenosis or neural foraminal narrowing in the cervical or thoracic spine. Electronically Signed: By: AMerilyn BabaM.D. On: 08/25/2022 21:23   MR Lumbar Spine W Wo Contrast  Result Date: 08/25/2022 CLINICAL DATA:  Low back pain, cauda equina syndrome suspected EXAM: MRI LUMBAR SPINE WITHOUT AND WITH CONTRAST TECHNIQUE: Multiplanar and multiecho pulse sequences of the lumbar spine were obtained without and with intravenous contrast. CONTRAST:  7.530mGADAVIST GADOBUTROL 1 MMOL/ML IV SOLN COMPARISON:  MRI lumbar spine 04/15/2020 FINDINGS: Segmentation: 5 lumbar type vertebral bodies. Partial sacralization of S1, with the last fully formed disc space at S1-S2. Alignment:  Mild dextrocurvature.  No listhesis. Vertebrae: No fracture, evidence of discitis, or bone lesion. No abnormal enhancement. Conus medullaris and cauda equina: Conus extends to the L1-L2 level. Conus and cauda equina appear normal. No abnormal enhancement. Paraspinal and other soft tissues: Small renal cysts, for which no follow-up is indicated. Normal muscle bulk and signal. Disc levels: T12-L1: No significant disc bulge. No spinal canal stenosis or neural foraminal  narrowing. L1-L2: No significant disc bulge. No spinal canal stenosis or neural foraminal narrowing. L2-L3: No significant disc bulge. No spinal canal stenosis or neural foraminal narrowing. L3-L4: No significant disc bulge. No spinal canal stenosis or neural foraminal narrowing. L4-L5: No significant disc bulge. No spinal canal stenosis or neural foraminal narrowing. L5-S1: Minimal disc bulge. Mild facet arthropathy. No spinal canal stenosis or neural foraminal narrowing. IMPRESSION: No spinal canal stenosis or neural foraminal narrowing. No abnormal  enhancement. No evidence of cauda equina syndrome. Electronically Signed   By: Merilyn Baba M.D.   On: 08/25/2022 20:29        Scheduled Meds:  amLODipine  10 mg Oral Daily   carvedilol  25 mg Oral BID WC   diazepam  2 mg Oral QHS   pantoprazole  40 mg Oral Daily   rosuvastatin  5 mg Oral Daily   tiZANidine  2 mg Oral QHS   Continuous Infusions:   LOS: 0 days     Maurice Maxim, MD [facility]   If 7PM-7AM, please contact night-coverage [email or web address] Password [email or web address] 08/26/2022, 1:05 PM

## 2022-08-27 ENCOUNTER — Observation Stay (HOSPITAL_COMMUNITY): Payer: BC Managed Care – PPO

## 2022-08-27 DIAGNOSIS — R29898 Other symptoms and signs involving the musculoskeletal system: Secondary | ICD-10-CM | POA: Diagnosis not present

## 2022-08-27 LAB — C DIFFICILE QUICK SCREEN W PCR REFLEX
C Diff antigen: NEGATIVE
C Diff interpretation: NOT DETECTED
C Diff toxin: NEGATIVE

## 2022-08-27 LAB — RESP PANEL BY RT-PCR (FLU A&B, COVID) ARPGX2
Influenza A by PCR: NEGATIVE
Influenza B by PCR: NEGATIVE
SARS Coronavirus 2 by RT PCR: NEGATIVE

## 2022-08-27 MED ORDER — COLCHICINE 0.6 MG PO TABS
0.6000 mg | ORAL_TABLET | Freq: Once | ORAL | Status: AC
  Administered 2022-08-27: 0.6 mg via ORAL
  Filled 2022-08-27: qty 1

## 2022-08-27 MED ORDER — INFLUENZA VAC SPLIT QUAD 0.5 ML IM SUSY: 0.5000 mL | PREFILLED_SYRINGE | INTRAMUSCULAR | Status: DC | PRN

## 2022-08-27 MED ORDER — SODIUM CHLORIDE 0.9 % IV SOLN: INTRAVENOUS | Status: DC

## 2022-08-27 NOTE — Progress Notes (Signed)
PROGRESS NOTE    Maurice Cruz  WFU:[id number] DOB: 01-28-1966 DOA: 08/25/2022 PCP: Glendon Axe, MD     Brief Narrative:  Maurice Cruz is a 56 y.o. male with medical history significant of spinal cord hemorrhage and followed by Dr. [name] with neurology as outpatient since 2021.  He has completed extensive rehab with documented deficits including bilateral lower extremity weakness, neurogenic bladder does self-catheterization, neurogenic bowel, and use of walking cane or walker as needed.  Also has history of anxiety, depression, GERD, hypertension. He presents to the ED complaining of worsening left leg weakness x 2 days.    New events last 24 hours / Subjective: Patient has worked with PT OT and recommended for CIR. He also admits to cough over the past several weeks; thinks he had some sinus infection and has had a lingering cough since.  Also admits to right ankle pain that is associated with swelling and tenderness to palpation.  He had a fever early this morning 100.6.  Assessment & Plan:   Principal Problem:   Leg weakness Active Problems:   Neurogenic bladder   Neurogenic bowel   AKI (acute kidney injury) (HCC)   Normocytic anemia   Left leg pain   Pleural effusion   Anxiety   GERD (gastroesophageal reflux disease)   Essential hypertension   Hyperlipidemia   Lower extremity weakness History of spinal cord hemorrhage (per Dr. 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 difficile  Fever with cough -Check chest x-ray -COVID, influenza negative -Blood cultures ordered   Right ankle swelling and pain, suspicious for gout -Right ankle x-ray -Start colchicine  DVT prophylaxis: SCD   Code Status: Full Family Communication: At bedside  Disposition Plan:  Status is: Observation The patient will require care spanning > 2 midnights and should be moved to inpatient because: Rehab, CXR, Right ankle xray, watch fever   Antimicrobials:  Anti-infectives (From admission, onward)    None        Objective: Vitals:   08/26/22 2153 08/27/22 0349 08/27/22 0720 08/27/22 1151  BP:  120/73 117/70 112/71  Pulse:  75 71 64  Resp:  16 18 18   Temp:  (!) 100.6 F (38.1 C) 99.5 F (37.5 C) 98.4 F (36.9 C)  TempSrc:  Oral Oral Oral  SpO2:  98% 97% 98%  Weight: 82.1 kg     Height: 5\' 10"  (1.778 m)       Intake/Output Summary (Last 24 hours) at 08/27/2022 1441 Last data filed at 08/27/2022 1239 Gross per 24 hour  Intake 2220.49 ml  Output 1475 ml  Net 745.49 ml   Filed Weights   08/26/22 2153  Weight: 82.1 kg    Examination:  General exam: Appears calm and comfortable  Respiratory system: Clear to auscultation. Respiratory effort normal. No respiratory distress. No conversational dyspnea. On room air  Cardiovascular system: S1 & S2 heard, RRR. No murmurs. Gastrointestinal system: Abdomen is nondistended, soft and nontender. Normal bowel sounds heard. Central nervous system: Alert and oriented.  Extremities: +right ankle joint  effusion and TTP  Psychiatry: Judgement and insight appear normal. Mood & affect appropriate.   Data Reviewed: I have personally reviewed following labs and imaging studies  CBC: Recent Labs  Lab 08/25/22 1625  WBC 10.0  NEUTROABS 6.4  HGB 11.5*  HCT 35.1*  MCV 85.0  PLT 195   Basic Metabolic Panel: Recent Labs  Lab 08/25/22 1625 08/26/22 0509  NA  136 139  K 5.1 4.8  CL 99 107  CO2 24 21*  GLUCOSE 103* 125*  BUN 32* 33*  CREATININE 2.75* 2.63*  CALCIUM 9.4 8.6*   GFR: Estimated Creatinine Clearance: 33.2 mL/min (A) (by C-G formula based on SCr of 2.63 mg/dL (H)). Liver Function Tests: No results for input(s): "AST", "ALT", "ALKPHOS", "BILITOT", "PROT", "ALBUMIN" in the last 168 hours. No results for input(s): "LIPASE", "AMYLASE" in the last 168 hours. No results for input(s): "AMMONIA" in the last 168 hours. Coagulation Profile: No results for input(s): "INR", "PROTIME" in the last 168 hours. Cardiac Enzymes: No results for input(s): "CKTOTAL", "CKMB", "CKMBINDEX", "TROPONINI" in the last 168 hours. BNP (last 3 results) No results for input(s): "PROBNP" in the last 8760 hours. HbA1C: No results for input(s): "HGBA1C" in the last 72 hours. CBG: No results for input(s): "GLUCAP" in the last 168 hours. Lipid Profile: No results for input(s): "CHOL", "HDL", "LDLCALC", "TRIG", "CHOLHDL", "LDLDIRECT" in the last 72 hours. Thyroid Function Tests: Recent Labs    08/26/22 0509  TSH 0.872   Anemia Panel: Recent Labs    08/26/22 0509  VITAMINB12 1,004*  FOLATE 29.9  FERRITIN 117  TIBC 223*  IRON 46  RETICCTPCT 1.1   Sepsis Labs: No results for input(s): "PROCALCITON", "LATICACIDVEN" in the last 168 hours.  Recent Results (from the past 240 hour(s))  Resp Panel by RT-PCR (Flu A&B, Covid) Anterior Nasal Swab     Status: None   Collection Time: 08/27/22  7:39 AM   Specimen: Anterior Nasal Swab  Result Value Ref Range Status   SARS Coronavirus 2 by RT PCR NEGATIVE NEGATIVE Final    Comment: (NOTE) SARS-CoV-2 target nucleic acids are NOT DETECTED.  The SARS-CoV-2 RNA is generally detectable in upper respiratory specimens during the acute phase of infection. The lowest concentration of SARS-CoV-2 viral copies this assay can detect is 138 copies/mL. A negative result does not preclude SARS-Cov-2 infection and should  not be used as the sole basis for treatment or other patient management decisions. A negative result may occur with  improper specimen collection/handling, submission of specimen other than nasopharyngeal swab, presence of viral mutation(s) within the areas targeted by this assay, and inadequate number of viral copies(<138 copies/mL). A negative result must be combined with clinical observations, patient history, and epidemiological information. The expected result is Negative.  Fact Sheet for Patients:  EntrepreneurPulse.com.au  Fact Sheet for Healthcare Providers:  IncredibleEmployment.be  This test is no t yet approved or cleared by the [place] FDA and  has been authorized for detection and/or diagnosis of SARS-CoV-2 by FDA under an Emergency Use Authorization (EUA). This EUA will remain  in effect (meaning this test can be used) for the duration of the COVID-19 declaration under Section 564(b)(1) of the Act, 21 U.S.C.section 360bbb-3(b)(1), unless the authorization is terminated  or revoked sooner.       Influenza A by PCR NEGATIVE NEGATIVE Final   Influenza B by PCR NEGATIVE NEGATIVE Final    Comment: (NOTE) The Xpert Xpress SARS-CoV-2/FLU/RSV plus assay is intended as an aid in the  diagnosis of influenza from Nasopharyngeal swab specimens and should not be used as a sole basis for treatment. Nasal washings and aspirates are unacceptable for Xpert Xpress SARS-CoV-2/FLU/RSV testing.  Fact Sheet for Patients: EntrepreneurPulse.com.au  Fact Sheet for Healthcare Providers: IncredibleEmployment.be  This test is not yet approved or cleared by the [place] FDA and has been authorized for detection and/or diagnosis of SARS-CoV-2 by FDA under an Emergency Use Authorization (EUA). This EUA will remain in effect (meaning this test can be used) for the duration of the COVID-19 declaration under  Section 564(b)(1) of the Act, 21 U.S.C. section 360bbb-3(b)(1), unless the authorization is terminated or revoked.  Performed at [facility], [place] [address], [place], [place] [address]       Radiology Studies: DG Knee 1-2 Views Left  Result Date: 08/26/2022 CLINICAL DATA:  Left leg weakness for 2 days. Had transverse myelitis and after rehab advanced from wheelchair to moving with walker. EXAM: LEFT KNEE - 1-2 VIEW COMPARISON:  None Available. FINDINGS: Normal bone mineralization. Joint spaces are preserved. No joint effusion. No acute fracture or dislocation. IMPRESSION: No significant abnormality. Electronically Signed   By: Yvonne Kendall M.D.   On: 08/26/2022 16:26   VAS [place] LOWER EXTREMITY VENOUS (DVT) (7a-7p)  Result Date: 08/26/2022  Lower Venous DVT Study Patient Name:  KHOA OPDAHL  Date of Exam:   08/26/2022 Medical Rec #: [id number]      Accession #:    [id number] Date of Birth: [birth year]     Patient Gender: M Patient Age:   39 years Exam Location:  [facility] Procedure:      VAS [place] LOWER EXTREMITY VENOUS (DVT) Referring Phys: Wandra Feinstein RATHORE --------------------------------------------------------------------------------  Indications: Pain.  Comparison Study: no prior Performing Technologist: Archie Patten RVS  Examination Guidelines: A complete evaluation includes B-mode imaging, spectral Doppler, color Doppler, and power Doppler as needed of all accessible portions of each vessel. Bilateral testing is considered an integral part of a complete examination. Limited examinations for reoccurring indications may be performed as noted. The reflux portion of the exam is performed with the patient in reverse Trendelenburg.  +---------+---------------+---------+-----------+----------+--------------+ RIGHT    CompressibilityPhasicitySpontaneityPropertiesThrombus Aging +---------+---------------+---------+-----------+----------+--------------+ CFV      Full            Yes      Yes                                 +---------+---------------+---------+-----------+----------+--------------+ SFJ      Full                                                        +---------+---------------+---------+-----------+----------+--------------+ FV Prox  Full                                                        +---------+---------------+---------+-----------+----------+--------------+ FV Mid   Full                                                        +---------+---------------+---------+-----------+----------+--------------+  FV DistalFull                                                        +---------+---------------+---------+-----------+----------+--------------+ PFV      Full                                                        +---------+---------------+---------+-----------+----------+--------------+ POP      Full           Yes      Yes                                 +---------+---------------+---------+-----------+----------+--------------+ PTV      Full                                                        +---------+---------------+---------+-----------+----------+--------------+ PERO     Full           Yes      Yes                                 +---------+---------------+---------+-----------+----------+--------------+   +----+---------------+---------+-----------+----------+--------------+ LEFTCompressibilityPhasicitySpontaneityPropertiesThrombus Aging +----+---------------+---------+-----------+----------+--------------+ CFV Full           Yes      Yes                                 +----+---------------+---------+-----------+----------+--------------+     Summary: RIGHT: - There is no evidence of deep vein thrombosis in the lower extremity.  - No cystic structure found in the popliteal fossa.  LEFT: - No evidence of common femoral vein obstruction.  *See table(s) above for measurements and  observations. Electronically signed by Monica Martinez MD on 08/26/2022 at 12:41:31 PM.    Final    US RENAL  Result Date: 08/26/2022 CLINICAL DATA:  Acute renal injury EXAM: RENAL / URINARY TRACT ULTRASOUND COMPLETE COMPARISON:  None Available. FINDINGS: Right Kidney: Renal measurements: 11.9 x 4.2 x 5.9 cm. = volume: 154 mL. Echogenicity within normal limits. No mass or hydronephrosis visualized. Left Kidney: Renal measurements: 13.7 x 6.0 x 5.6 cm. = volume: 240 mL. Echogenicity within normal limits. No mass or hydronephrosis visualized. Bladder: Appears normal for degree of bladder distention. Other: None. IMPRESSION: Normal appearing kidneys bilaterally. Electronically Signed   By: Inez Catalina M.D.   On: 08/26/2022 01:30   MR Cervical Spine W or Wo Contrast  Addendum Date: 08/25/2022   ADDENDUM REPORT: 08/25/2022 23:30 ADDENDUM: Additional comparison is made to MRI thoracic spine 03/14/2021. The T1 and T2 hypointense signal in the spinal cord posterior to T9 appears unchanged compared to 2022. These findings were discussed by telephone on 08/25/2022 at 11:19 pm with provider Khaliqdina. Electronically Signed   By: Merilyn Baba M.D.   On: 08/25/2022 23:30   Result Date: 08/25/2022  CLINICAL DATA:  Ataxia, cervical or thoracic pathology suspected EXAM: MRI CERVICAL AND THORACIC SPINE WITHOUT AND WITH CONTRAST TECHNIQUE: Multiplanar and multiecho pulse sequences of the cervical spine, to include the craniocervical junction and cervicothoracic junction, and the thoracic spine, were obtained without and with intravenous contrast. CONTRAST:  7.66mL GADAVIST GADOBUTROL 1 MMOL/ML IV SOLN COMPARISON:  MRI cervical and thoracic spine 04/15/2020 FINDINGS: MRI CERVICAL SPINE FINDINGS Alignment: Straightening of the normal cervical lordosis. Mild dextrocurvature. No listhesis. Vertebrae: No fracture, evidence of discitis, or bone lesion. Diffusely decreased marrow signal. No abnormal enhancement. Cord: Normal  signal and morphology.  No abnormal enhancement. Posterior Fossa, vertebral arteries, paraspinal tissues: Negative. Disc levels: Preserved disc hydration without significant disc bulge. No spinal canal stenosis or neural foraminal narrowing in the cervical spine. MRI THORACIC SPINE FINDINGS Alignment: S shaped curvature of the thoracolumbar spine. No significant listhesis. Vertebrae: No fracture, evidence of discitis, or bone lesion. No abnormal enhancement. Cord: The cord is normal in morphology. On susceptibility weighted imaging, there is hypointense signal in the anterior spinal cord at T8-T9 to T9-T10 (series 14, images 25-30). This is associated with hypointense signal on T1 and T2 weighted sequences, most likely chronic hemorrhage, which correlates with an area of enhancement on the prior MRI. No abnormal enhancement or increased T2 hyperintense signal on the current study. Paraspinal and other soft tissues: Small right-greater-than-left pleural effusions. Disc levels: No significant spinal canal stenosis or neural foraminal narrowing. IMPRESSION: 1. 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 No abnormal enhancement or increased T2 hyperintense signal on the current study; abnormal signal in the adjacent cord on the prior exam could have reflected edema related to the hemorrhage. 2. Diffusely decreased marrow signal, which is nonspecific but can be seen in the setting of anemia, obesity or smoking. 3. Small right-greater-than-left pleural effusions. 4. No spinal canal stenosis or neural foraminal narrowing in the cervical or thoracic spine. Electronically Signed: By: Merilyn Baba M.D. On: 08/25/2022 21:23   MR THORACIC SPINE W WO CONTRAST  Addendum Date: 08/25/2022   ADDENDUM REPORT: 08/25/2022 23:30 ADDENDUM:  Additional comparison is made to MRI thoracic spine 03/14/2021. The T1 and T2 hypointense signal in the spinal cord posterior to T9 appears unchanged compared to 2022. These findings were discussed by telephone on 08/25/2022 at 11:19 pm with provider Khaliqdina. Electronically Signed   By: Merilyn Baba M.D.   On: 08/25/2022 23:30   Result Date: 08/25/2022 CLINICAL DATA:  Ataxia, cervical or thoracic pathology suspected EXAM: MRI CERVICAL AND THORACIC SPINE WITHOUT AND WITH CONTRAST TECHNIQUE: Multiplanar and multiecho pulse sequences of the cervical spine, to include the craniocervical junction and cervicothoracic junction, and the thoracic spine, were obtained without and with intravenous contrast. CONTRAST:  7.11mL GADAVIST GADOBUTROL 1 MMOL/ML IV SOLN COMPARISON:  MRI cervical and thoracic spine 04/15/2020 FINDINGS: MRI CERVICAL SPINE FINDINGS Alignment: Straightening of the normal cervical lordosis. Mild dextrocurvature. No listhesis. Vertebrae: No fracture, evidence of discitis, or bone lesion. Diffusely decreased marrow signal. No abnormal enhancement. Cord: Normal signal and morphology.  No abnormal enhancement. Posterior Fossa, vertebral arteries, paraspinal tissues: Negative. Disc levels: Preserved disc hydration without significant disc bulge. No spinal canal stenosis or neural foraminal narrowing in the cervical spine. MRI THORACIC SPINE FINDINGS Alignment: S shaped curvature of the thoracolumbar spine. No significant listhesis.  Vertebrae: No fracture, evidence of discitis, or bone lesion. No abnormal enhancement. Cord: The cord is normal in morphology. On susceptibility weighted imaging, there is hypointense signal in the anterior spinal cord at T8-T9 to T9-T10 (series 14, images 25-30). This is associated with hypointense signal on T1 and T2 weighted sequences, most likely chronic hemorrhage, which correlates with an area of enhancement on the prior MRI. No abnormal enhancement or increased T2  hyperintense signal on the current study. Paraspinal and other soft tissues: Small right-greater-than-left pleural effusions. Disc levels: No significant spinal canal stenosis or neural foraminal narrowing. IMPRESSION: 1. 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 No abnormal enhancement or increased T2 hyperintense signal on the current study; abnormal signal in the adjacent cord on the prior exam could have reflected edema related to the hemorrhage. 2. Diffusely decreased marrow signal, which is nonspecific but can be seen in the setting of anemia, obesity or smoking. 3. Small right-greater-than-left pleural effusions. 4. No spinal canal stenosis or neural foraminal narrowing in the cervical or thoracic spine. Electronically Signed: By: Merilyn Baba M.D. On: 08/25/2022 21:23   MR Lumbar Spine W Wo Contrast  Result Date: 08/25/2022 CLINICAL DATA:  Low back pain, cauda equina syndrome suspected EXAM: MRI LUMBAR SPINE WITHOUT AND WITH CONTRAST TECHNIQUE: Multiplanar and multiecho pulse sequences of the lumbar spine were obtained without and with intravenous contrast. CONTRAST:  7.67mL GADAVIST GADOBUTROL 1 MMOL/ML IV SOLN COMPARISON:  MRI lumbar spine 04/15/2020 FINDINGS: Segmentation: 5 lumbar type vertebral bodies. Partial sacralization of S1, with the last fully formed disc space at S1-S2. Alignment:  Mild dextrocurvature.  No listhesis. Vertebrae: No fracture, evidence of discitis, or bone lesion. No abnormal enhancement. Conus medullaris and cauda equina: Conus extends to the L1-L2 level. Conus and cauda equina appear normal. No abnormal enhancement. Paraspinal and other soft tissues: Small renal cysts, for which no follow-up is indicated. Normal muscle bulk and signal. Disc levels: T12-L1: No significant disc  bulge. No spinal canal stenosis or neural foraminal narrowing. L1-L2: No significant disc bulge. No spinal canal stenosis or neural foraminal narrowing. L2-L3: No significant disc bulge. No spinal canal stenosis or neural foraminal narrowing. L3-L4: No significant disc bulge. No spinal canal stenosis or neural foraminal narrowing. L4-L5: No significant disc bulge. No spinal canal stenosis or neural foraminal narrowing. L5-S1: Minimal disc bulge. Mild facet arthropathy. No spinal canal stenosis or neural foraminal narrowing. IMPRESSION: No spinal canal stenosis or neural foraminal narrowing. No abnormal enhancement. No evidence of cauda equina syndrome. Electronically Signed   By: Merilyn Baba M.D.   On: 08/25/2022 20:29      Scheduled Meds:  amLODipine  10 mg Oral Daily   carvedilol  25 mg Oral BID WC   diazepam  2 mg Oral QHS   pantoprazole  40 mg Oral Daily   rosuvastatin  5 mg Oral Daily   tiZANidine  2 mg Oral QHS   Continuous Infusions:   LOS: 0 days     Dessa Phi, DO [facility] 08/27/2022, 2:41 PM   Available via Epic secure chat 7am-7pm After these hours, please refer to coverage provider listed on [email or web address]

## 2022-08-27 NOTE — Evaluation (Signed)
Occupational Therapy Evaluation Patient Details Name: Maurice Cruz MRN: [id number] DOB: Mar 21, 1966 Today's Date: 08/27/2022   History of Present Illness Pt is 56 yo male presenting on 08/25/22 with worsening LE weakness.  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 He has chronic LE weakness, neurogenic bladder and bowel. Current MRI shows what appears to be chronic hemorrhage T8-T10.  Neurosurgery evaluated and reports no intervention from them needed. Neurology is following.  No clear explanation for progression of weakness at this point.  Does have AKI possibly as factor.  Pt with other hx including anxiety, depression, GERD, and HTN.   Clinical Impression   Pt admitted with the above diagnoses and presents with below problem list. Pt will benefit from continued acute OT to address the below listed deficits and maximize independence with basic ADLs prior to d/c to venue below. At baseline, pt is mod I with ADLs, uses walker for most ambulation, w/c for community mobility. Pt presents with BLE weakness (LLE weaker than RLE currently), impaired balance, and decreased activity tolerance impacting ability to complete ADLs. Pt currently needs mod A with LB ADLs and functional transfers, min A for supine>EOB. Up in recliner at end of session.        Recommendations for follow up therapy are one component of a multi-disciplinary discharge planning process, led by the attending physician.  Recommendations may be updated based on patient status, additional functional criteria and insurance authorization.   Follow Up Recommendations  Acute inpatient rehab (3hours/day)     Assistance Recommended at Discharge  Frequent or constant Supervision/Assistance   Patient can return home with the following  A lot of help with walking and/or transfers;A lot of help with bathing/dressing/bathroom;Assistance with cooking/housework;Help with stairs or ramp for  entrance     Functional Status Assessment  Patient has had a recent decline in their functional status and demonstrates the ability to make significant improvements in function in a reasonable and predictable amount of time.  Equipment Recommendations  Other (comment) (defer to next venue)    Recommendations for Other Services       Precautions / Restrictions Precautions Precautions: Fall      Mobility Bed Mobility Overal bed mobility: Needs Assistance Bed Mobility: Supine to Sit     Supine to sit: Min assist     General bed mobility comments: Pt using UEs to advance RLE across bed.  Extra time and effort, min A to steady.    Transfers Overall transfer level: Needs assistance Equipment used: Rolling walker (2 wheels) Transfers: Sit to/from Stand, Bed to chair/wheelchair/BSC Sit to Stand: Mod assist Stand pivot transfers: Mod assist         General transfer comment: Successful on second attempt. Bed elevated, pt with heavy reliance on BUE.  Mod A to powerup and steady and to control descent in to recliner. min A to steady while taking pivotal steps to recliner      Balance Overall balance assessment: Needs assistance Sitting-balance support: No upper extremity supported Sitting balance-Leahy Scale: Good     Standing balance support: Bilateral upper extremity supported, Reliant on assistive device for balance Standing balance-Leahy Scale: Poor Standing balance comment: RW and min A                           ADL either performed or assessed with clinical judgement   ADL Overall ADL's : Needs assistance/impaired Eating/Feeding: Set  up;Sitting   Grooming: Set up;Sitting   Upper Body Bathing: Set up;Sitting;Supervision/ safety   Lower Body Bathing: Moderate assistance;Sitting/lateral leans;Sit to/from stand   Upper Body Dressing : Set up;Supervision/safety;Sitting   Lower Body Dressing: Moderate assistance;Sitting/lateral leans;Sit to/from stand    Toilet Transfer: Moderate assistance;Stand-pivot;Rolling walker (2 wheels) Toilet Transfer Details (indicate cue type and reason): simulated with EOB to recliner Toileting- Clothing Manipulation and Hygiene: Moderate assistance;Sitting/lateral lean;Sit to/from stand   Tub/ Banker: Moderate assistance;Stand-pivot     General ADL Comments: Pt completed bed mobility and took pivotal steps to access recliner. Mod assist for transfers     Vision         Perception     Praxis      Pertinent Vitals/Pain Pain Assessment Pain Assessment: Faces Faces Pain Scale: Hurts little more Pain Location: R ankle Pain Descriptors / Indicators: Aching, Throbbing Pain Intervention(s): Monitored during session, Limited activity within patient's tolerance, Repositioned     Hand Dominance     Extremity/Trunk Assessment Upper Extremity Assessment Upper Extremity Assessment: Overall WFL for tasks assessed   Lower Extremity Assessment Lower Extremity Assessment: Defer to PT evaluation       Communication Communication Communication: No difficulties   Cognition Arousal/Alertness: Awake/alert Behavior During Therapy: WFL for tasks assessed/performed Overall Cognitive Status: Within Functional Limits for tasks assessed                                       General Comments       Exercises     Shoulder Instructions      Home Living Family/patient expects to be discharged to:: Private residence Living Arrangements: Parent Available Help at Discharge: Family;Available 24 hours/day Type of Home: House Home Access: Stairs to enter [employer] of Steps: 1   Home Layout: Multi-level Alternate Level Stairs-Number of Steps: has stair lifts on all steps   Bathroom Shower/Tub: Teacher, early years/pre: Standard Bathroom Accessibility:  (can turn RW sideways to get in)   Home Equipment: Conservation officer, nature (2 wheels);Wheelchair - manual;Tub bench    Additional Comments: stair lifts, doesn't have to use tub bench      Prior Functioning/Environment Prior Level of Function : Independent/Modified Independent             Mobility Comments: ambulates with RW for short community distances but if longer will take w/c ADLs Comments: mod I (walker) for ADLs and light ADLs        OT Problem List: Decreased activity tolerance;Impaired balance (sitting and/or standing);Decreased knowledge of use of DME or AE;Decreased knowledge of precautions;Pain;Decreased strength;Decreased range of motion      OT Treatment/Interventions: Self-care/ADL training;Therapeutic exercise;DME and/or AE instruction;Therapeutic activities;Patient/family education;Balance training    OT Goals(Current goals can be found in the care plan section) Acute Rehab OT Goals Patient Stated Goal: regain LLE strength, return to baseline with ADLs OT Goal Formulation: With patient Time For Goal Achievement: 09/09/22 Potential to Achieve Goals: Good ADL Goals Pt Will Perform Lower Body Bathing: with min guard assist;sitting/lateral leans;sit to/from stand Pt Will Perform Lower Body Dressing: with min guard assist;sit to/from stand;sitting/lateral leans Pt Will Transfer to Toilet: with min guard assist;stand pivot transfer;bedside commode Pt Will Perform Toileting - Clothing Manipulation and hygiene: with min guard assist;sitting/lateral leans;sit to/from stand  OT Frequency: Min 2X/week    Co-evaluation  AM-PAC OT "6 Clicks" Daily Activity     Outcome Measure Help from another person eating meals?: None Help from another person taking care of personal grooming?: A Little Help from another person toileting, which includes using toliet, bedpan, or urinal?: A Lot Help from another person bathing (including washing, rinsing, drying)?: A Lot Help from another person to put on and taking off regular upper body clothing?: A Little Help from another person to  put on and taking off regular lower body clothing?: A Lot 6 Click Score: 16   End of Session Equipment Utilized During Treatment: Rolling walker (2 wheels) Nurse Communication: Mobility status  Activity Tolerance: Patient tolerated treatment well Patient left: in chair;with call bell/phone within reach;Other (comment) (chair alarm pad under pt but no green box in the room-nursing notified)  OT Visit Diagnosis: Unsteadiness on feet (R26.81);Other abnormalities of gait and mobility (R26.89);Muscle weakness (generalized) (M62.81);Pain Pain - Right/Left: Right Pain - part of body: Ankle and joints of foot                Time: 1610-9604 OT Time Calculation (min): 16 min Charges:  OT General Charges $OT Visit: 1 Visit OT Evaluation $OT Eval Moderate Complexity: [facility] [place], OT Acute Rehabilitation Services Office: [phone]   Hortencia Pilar 08/27/2022, 10:38 AM

## 2022-08-27 NOTE — Progress Notes (Signed)
Inpatient Rehabilitation Admissions Coordinator   Patient to be admitted form Observation status. I will place rehab consult for full assessment of his rehab venue options.  Danne Baxter, RN, MSN Rehab Admissions Coordinator [phone] 08/27/2022 3:49 PM

## 2022-08-28 DIAGNOSIS — J189 Pneumonia, unspecified organism: Secondary | ICD-10-CM | POA: Diagnosis not present

## 2022-08-28 DIAGNOSIS — R197 Diarrhea, unspecified: Secondary | ICD-10-CM | POA: Diagnosis present

## 2022-08-28 DIAGNOSIS — D638 Anemia in other chronic diseases classified elsewhere: Secondary | ICD-10-CM | POA: Diagnosis not present

## 2022-08-28 DIAGNOSIS — Z993 Dependence on wheelchair: Secondary | ICD-10-CM | POA: Diagnosis not present

## 2022-08-28 DIAGNOSIS — Z87828 Personal history of other (healed) physical injury and trauma: Secondary | ICD-10-CM | POA: Diagnosis not present

## 2022-08-28 DIAGNOSIS — D72829 Elevated white blood cell count, unspecified: Secondary | ICD-10-CM | POA: Diagnosis not present

## 2022-08-28 DIAGNOSIS — K219 Gastro-esophageal reflux disease without esophagitis: Secondary | ICD-10-CM | POA: Diagnosis present

## 2022-08-28 DIAGNOSIS — F419 Anxiety disorder, unspecified: Secondary | ICD-10-CM | POA: Diagnosis present

## 2022-08-28 DIAGNOSIS — F4321 Adjustment disorder with depressed mood: Secondary | ICD-10-CM | POA: Diagnosis not present

## 2022-08-28 DIAGNOSIS — I10 Essential (primary) hypertension: Secondary | ICD-10-CM

## 2022-08-28 DIAGNOSIS — E785 Hyperlipidemia, unspecified: Secondary | ICD-10-CM | POA: Diagnosis present

## 2022-08-28 DIAGNOSIS — I129 Hypertensive chronic kidney disease with stage 1 through stage 4 chronic kidney disease, or unspecified chronic kidney disease: Secondary | ICD-10-CM | POA: Diagnosis present

## 2022-08-28 DIAGNOSIS — N189 Chronic kidney disease, unspecified: Secondary | ICD-10-CM | POA: Diagnosis not present

## 2022-08-28 DIAGNOSIS — G959 Disease of spinal cord, unspecified: Secondary | ICD-10-CM | POA: Diagnosis not present

## 2022-08-28 DIAGNOSIS — M10371 Gout due to renal impairment, right ankle and foot: Secondary | ICD-10-CM | POA: Diagnosis not present

## 2022-08-28 DIAGNOSIS — N179 Acute kidney failure, unspecified: Secondary | ICD-10-CM | POA: Diagnosis present

## 2022-08-28 DIAGNOSIS — G8220 Paraplegia, unspecified: Secondary | ICD-10-CM | POA: Diagnosis present

## 2022-08-28 DIAGNOSIS — R32 Unspecified urinary incontinence: Secondary | ICD-10-CM | POA: Diagnosis present

## 2022-08-28 DIAGNOSIS — N184 Chronic kidney disease, stage 4 (severe): Secondary | ICD-10-CM | POA: Diagnosis present

## 2022-08-28 DIAGNOSIS — D631 Anemia in chronic kidney disease: Secondary | ICD-10-CM | POA: Diagnosis present

## 2022-08-28 DIAGNOSIS — Z1152 Encounter for screening for COVID-19: Secondary | ICD-10-CM | POA: Diagnosis not present

## 2022-08-28 DIAGNOSIS — N319 Neuromuscular dysfunction of bladder, unspecified: Secondary | ICD-10-CM | POA: Diagnosis present

## 2022-08-28 DIAGNOSIS — R5381 Other malaise: Secondary | ICD-10-CM | POA: Diagnosis not present

## 2022-08-28 DIAGNOSIS — F32A Depression, unspecified: Secondary | ICD-10-CM | POA: Diagnosis present

## 2022-08-28 DIAGNOSIS — J90 Pleural effusion, not elsewhere classified: Secondary | ICD-10-CM | POA: Diagnosis present

## 2022-08-28 DIAGNOSIS — M109 Gout, unspecified: Secondary | ICD-10-CM | POA: Diagnosis present

## 2022-08-28 DIAGNOSIS — R159 Full incontinence of feces: Secondary | ICD-10-CM | POA: Diagnosis present

## 2022-08-28 DIAGNOSIS — Z79899 Other long term (current) drug therapy: Secondary | ICD-10-CM | POA: Diagnosis not present

## 2022-08-28 DIAGNOSIS — K592 Neurogenic bowel, not elsewhere classified: Secondary | ICD-10-CM | POA: Diagnosis not present

## 2022-08-28 DIAGNOSIS — R29898 Other symptoms and signs involving the musculoskeletal system: Secondary | ICD-10-CM | POA: Diagnosis present

## 2022-08-28 DIAGNOSIS — N3289 Other specified disorders of bladder: Secondary | ICD-10-CM | POA: Diagnosis present

## 2022-08-28 LAB — BASIC METABOLIC PANEL
Anion gap: 7 (ref 5–15)
BUN: 23 mg/dL — ABNORMAL HIGH (ref 6–20)
CO2: 20 mmol/L — ABNORMAL LOW (ref 22–32)
Calcium: 8.4 mg/dL — ABNORMAL LOW (ref 8.9–10.3)
Chloride: 109 mmol/L (ref 98–111)
Creatinine, Ser: 1.93 mg/dL — ABNORMAL HIGH (ref 0.61–1.24)
GFR, Estimated: 41 mL/min — ABNORMAL LOW (ref >60)
Glucose, Bld: 97 mg/dL (ref 70–99)
Potassium: 4.3 mmol/L (ref 3.5–5.1)
Sodium: 136 mmol/L (ref 135–145)

## 2022-08-28 MED ORDER — SODIUM CHLORIDE 0.9 % IV SOLN
1.0000 g | INTRAVENOUS | Status: DC
  Administered 2022-08-28 – 2022-08-29 (×2): 1 g via INTRAVENOUS
  Filled 2022-08-28 (×2): qty 10

## 2022-08-28 MED ORDER — COLCHICINE 0.6 MG PO TABS
0.6000 mg | ORAL_TABLET | Freq: Every day | ORAL | Status: DC
  Administered 2022-08-28 – 2022-08-29 (×2): 0.6 mg via ORAL
  Filled 2022-08-28 (×2): qty 1

## 2022-08-28 MED ORDER — METHYLPREDNISOLONE SODIUM SUCC 125 MG IJ SOLR
40.0000 mg | Freq: Every day | INTRAMUSCULAR | Status: DC
  Administered 2022-08-28 – 2022-08-29 (×2): 40 mg via INTRAVENOUS
  Filled 2022-08-28 (×2): qty 2

## 2022-08-28 MED ORDER — AZITHROMYCIN 500 MG PO TABS
500.0000 mg | ORAL_TABLET | Freq: Every day | ORAL | Status: DC
  Administered 2022-08-28 – 2022-08-29 (×2): 500 mg via ORAL
  Filled 2022-08-28 (×2): qty 1

## 2022-08-28 NOTE — Progress Notes (Signed)
Inpatient Rehab Admissions Coordinator:    I met with pt. To discuss potential  CIR admit. He is interested, states he has been to [facility] before and done well. Lives with his parents who can provide supervision to light assist at d/c. I will open a case with his insurance and pursue for admit.   Clemens Catholic, [place], [place] Admissions Coordinator  [phone] ([place]) [phone] (office)

## 2022-08-28 NOTE — Progress Notes (Signed)
PROGRESS NOTE    Maurice Cruz  MVH:[id number] DOB: 1965/12/09 DOA: 08/25/2022 PCP: Glendon Axe, MD   Brief Narrative:   56 y.o. male with medical history significant of spinal cord hemorrhage and followed by Dr. [name] with neurology as outpatient since 2021.  He has completed extensive rehab with documented deficits including bilateral lower extremity weakness, neurogenic bladder does self-catheterization, neurogenic bowel, and use of walking cane or walker as needed.  Also has history of anxiety, depression, GERD, hypertension. He presented to the ED complaining of worsening left leg weakness x 2 days.   MRI revealed chronic emergency T8-10.  Neurosurgery recommended no intervention.  Neurology thought that presentation was because of metabolic stressors.  PT recommended CIR placement.  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 -Creatinine improving with IV fluids to 1.93 today.  Decrease normal saline to 75 cc an hour.  Repeat a.m. labs  Possible left lower lobe pneumonia -Presenting with fever, cough and chest x-ray showing possible left lower lobe pneumonia -Start Rocephin and Zithromax.  Oxygen supplementation as needed -Blood cultures negative so far.  COVID-19 and influenza testing negative.  Possible right ankle acute gout -Patient thinks that he is having acute gout flare.  Received a dose of colchicine yesterday and feels slightly better. -Continue colchicine.  Start Solu-Medrol 40 mg IV daily.  Diarrhea -stool for C. difficile  negative.  Hypertension -Continue Coreg and amlodipine  Hyperlipidemia -Continue statin   DVT prophylaxis: SCDs Code Status: Full Family Communication: None at bedside Disposition Plan: Status is: Inpatient Remains inpatient appropriate because: Of severity of illness   Consultants: Neurology/neurosurgery  Procedures: None  Antimicrobials:  Anti-infectives (From admission, onward)    Start     Dose/Rate Route Frequency Ordered Stop   08/28/22 1030  cefTRIAXone (ROCEPHIN) 1 g in sodium chloride 0.9 % 100 mL IVPB        1 g 200 mL/hr over 30 Minutes Intravenous Every 24 hours 08/28/22 0932 09/02/22 1029   08/28/22 1030  azithromycin (ZITHROMAX) tablet 500 mg        500 mg Oral Daily 08/28/22 0932 09/02/22 0959        Subjective: Patient seen and examined at bedside.  Right ankle pain is slightly improving since yesterday.  Still has intermittent cough.  No fever, vomiting, chest pain reported.  Objective: Vitals:   08/27/22 1531 08/27/22 1955 08/28/22 0402 08/28/22 0830  BP: 117/71 113/69 118/75 126/77  Pulse: 71 69 64 71  Resp: 18 18 18 18   Temp: 98.9 F (37.2 C) 99.8 F (37.7 C) 98.5 F (36.9 C) 98.2 F (36.8 C)  TempSrc: Oral Oral  Oral  SpO2: 98% 98% 99% 99%  Weight:      Height:        Intake/Output Summary (Last 24 hours) at 08/28/2022 0931 Last data filed at 08/28/2022 8413 Gross per 24 hour  Intake 1931.13 ml  Output 1775 ml  Net 156.13 ml   Filed Weights   08/26/22 2153  Weight: 82.1 kg    Examination:  General exam: Appears calm and comfortable.  On room air. Respiratory system: Bilateral decreased breath  sounds at bases with some scattered crackles Cardiovascular system: S1 & S2 heard, Rate controlled Gastrointestinal system: Abdomen is nondistended, soft and nontender. Normal bowel sounds heard. Extremities: No cyanosis, clubbing; trace lower extremity edema present.  Right ankle tenderness present Central nervous system: Alert and  oriented.  Skin: No rashes, lesions or ulcers Psychiatry: Mood, affect and judgment are normal    Data Reviewed: I have personally reviewed following labs and imaging studies  CBC: Recent Labs  Lab 08/25/22 1625  WBC 10.0  NEUTROABS 6.4  HGB 11.5*  HCT 35.1*  MCV 85.0  PLT 510   Basic Metabolic Panel: Recent Labs  Lab 08/25/22 1625 08/26/22 0509 08/28/22 0555  NA 136 139 136  K 5.1 4.8 4.3  CL 99 107 109  CO2 24 21* 20*  GLUCOSE 103* 125* 97  BUN 32* 33* 23*  CREATININE 2.75* 2.63* 1.93*  CALCIUM 9.4 8.6* 8.4*   GFR: Estimated Creatinine Clearance: 45.2 mL/min (A) (by C-G formula based on SCr of 1.93 mg/dL (H)). Liver Function Tests: No results for input(s): "AST", "ALT", "ALKPHOS", "BILITOT", "PROT", "ALBUMIN" in the last 168 hours. No results for input(s): "LIPASE", "AMYLASE" in the last 168 hours. No results for input(s): "AMMONIA" in the last 168 hours. Coagulation Profile: No results for input(s): "INR", "PROTIME" in the last 168 hours. Cardiac Enzymes: No results for input(s): "CKTOTAL", "CKMB", "CKMBINDEX", "TROPONINI" in the last 168 hours. BNP (last 3 results) No results for input(s): "PROBNP" in the last 8760 hours. HbA1C: No results for input(s): "HGBA1C" in the last 72 hours. CBG: No results for input(s): "GLUCAP" in the last 168 hours. Lipid Profile: No results for input(s): "CHOL", "HDL", "LDLCALC", "TRIG", "CHOLHDL", "LDLDIRECT" in the last 72 hours. Thyroid Function Tests: Recent Labs    08/26/22 0509  TSH 0.872   Anemia Panel: Recent Labs    08/26/22 0509  VITAMINB12 1,004*  FOLATE 29.9  FERRITIN 117  TIBC 223*  IRON 46  RETICCTPCT 1.1   Sepsis Labs: No results for input(s): "PROCALCITON", "LATICACIDVEN" in the last 168 hours.  Recent Results (from the past 240 hour(s))  Resp Panel by RT-PCR (Flu A&B, Covid) Anterior Nasal Swab     Status: None   Collection Time: 08/27/22  7:39 AM   Specimen: Anterior Nasal Swab  Result  Value Ref Range Status   SARS Coronavirus 2 by RT PCR NEGATIVE NEGATIVE Final    Comment: (NOTE) SARS-CoV-2 target nucleic acids are NOT DETECTED.  The SARS-CoV-2 RNA is generally detectable in upper respiratory specimens during the acute phase of infection. The lowest concentration of SARS-CoV-2 viral copies this assay can detect is 138 copies/mL. A negative result does not preclude SARS-Cov-2 infection and should not be used as the sole basis for treatment or other patient management decisions. A negative result may occur with  improper specimen collection/handling, submission of specimen other than nasopharyngeal swab, presence of viral mutation(s) within the areas targeted by this assay, and inadequate number of viral copies(<138 copies/mL). A negative result must be combined with clinical observations, patient history, and epidemiological information. The expected result is Negative.  Fact Sheet for Patients:  EntrepreneurPulse.com.au  Fact Sheet for Healthcare Providers:  IncredibleEmployment.be  This test is no t yet approved or cleared by the [place] FDA and  has been authorized for detection and/or diagnosis of SARS-CoV-2 by FDA under an Emergency Use Authorization (EUA). This EUA will remain  in effect (meaning this test can be used) for the duration of the COVID-19 declaration  under Section 564(b)(1) of the Act, 21 U.S.C.section 360bbb-3(b)(1), unless the authorization is terminated  or revoked sooner.       Influenza A by PCR NEGATIVE NEGATIVE Final   Influenza B by PCR NEGATIVE NEGATIVE Final    Comment: (NOTE) The Xpert Xpress SARS-CoV-2/FLU/RSV plus assay is intended as an aid in the diagnosis of influenza from Nasopharyngeal swab specimens and should not be used as a sole basis for treatment. Nasal washings and aspirates are unacceptable for Xpert Xpress SARS-CoV-2/FLU/RSV testing.  Fact Sheet for  Patients: EntrepreneurPulse.com.au  Fact Sheet for Healthcare Providers: IncredibleEmployment.be  This test is not yet approved or cleared by the [place] FDA and has been authorized for detection and/or diagnosis of SARS-CoV-2 by FDA under an Emergency Use Authorization (EUA). This EUA will remain in effect (meaning this test can be used) for the duration of the COVID-19 declaration under Section 564(b)(1) of the Act, 21 U.S.C. section 360bbb-3(b)(1), unless the authorization is terminated or revoked.  Performed at [facility], [place] [address] [place], [place] [address]   C Difficile Quick Screen w PCR reflex     Status: None   Collection Time: 08/27/22 12:37 PM   Specimen: STOOL  Result Value Ref Range Status   C Diff antigen NEGATIVE NEGATIVE Final   C Diff toxin NEGATIVE NEGATIVE Final   C Diff interpretation No C. difficile detected.  Final    Comment: Performed at [facility], [place] [address], [place], [place] [address]  Culture, blood (Routine X 2) w Reflex to ID Panel     Status: None (Preliminary result)   Collection Time: 08/27/22  3:49 PM   Specimen: BLOOD RIGHT HAND  Result Value Ref Range Status   Specimen Description BLOOD RIGHT HAND  Final   Special Requests   Final    BOTTLES DRAWN AEROBIC AND ANAEROBIC Blood Culture adequate volume   Culture   Final    NO GROWTH < 24 HOURS Performed at [facility], [place] [address] [place], [place] [address]    Report Status PENDING  Incomplete  Culture, blood (Routine X 2) w Reflex to ID Panel     Status: None (Preliminary result)   Collection Time: 08/27/22  3:51 PM   Specimen: BLOOD LEFT ARM  Result Value Ref Range Status   Specimen Description BLOOD LEFT ARM  Final   Special Requests   Final    BOTTLES DRAWN AEROBIC AND ANAEROBIC Blood Culture adequate volume   Culture   Final    NO GROWTH < 24 HOURS Performed at [facility], [place] [address],  [place], [place] [address]    Report Status PENDING  Incomplete         Radiology Studies: DG CHEST PORT 1 VIEW  Result Date: 08/27/2022 CLINICAL DATA:  Cough and fever EXAM: PORTABLE CHEST 1 VIEW COMPARISON:  None Available. FINDINGS: Negative for heart failure. Right lung is clear. Possible early airspace disease left lower lobe which could be atelectasis or infiltrate. No effusion IMPRESSION: Possible left lower lobe atelectasis or infiltrate. Electronically Signed   By: Franchot Gallo M.D.   On: 08/27/2022 16:53   DG Ankle Complete Right  Result Date: 08/27/2022 CLINICAL DATA:  Gout.  Ankle swelling EXAM: RIGHT ANKLE - COMPLETE 3+ VIEW COMPARISON:  None Available. FINDINGS: Normal alignment. No fracture. No erosion or joint space narrowing. Small joint effusion. IMPRESSION: Small joint effusion. No fracture or joint space narrowing. Electronically Signed  By: Franchot Gallo M.D.   On: 08/27/2022 16:52   DG Knee 1-2 Views Left  Result Date: 08/26/2022 CLINICAL DATA:  Left leg weakness for 2 days. Had transverse myelitis and after rehab advanced from wheelchair to moving with walker. EXAM: LEFT KNEE - 1-2 VIEW COMPARISON:  None Available. FINDINGS: Normal bone mineralization. Joint spaces are preserved. No joint effusion. No acute fracture or dislocation. IMPRESSION: No significant abnormality. Electronically Signed   By: Yvonne Kendall M.D.   On: 08/26/2022 16:26   VAS [place] LOWER EXTREMITY VENOUS (DVT) (7a-7p)  Result Date: 08/26/2022  Lower Venous DVT Study Patient Name:  Maurice Cruz  Date of Exam:   08/26/2022 Medical Rec #: [id number]      Accession #:    [id number] Date of Birth: Feb 20, 1966     Patient Gender: M Patient Age:   19 years Exam Location:  [facility] Procedure:      VAS [place] LOWER EXTREMITY VENOUS (DVT) Referring Phys: Wandra Feinstein RATHORE --------------------------------------------------------------------------------  Indications: Pain.  Comparison Study: no prior  Performing Technologist: Archie Patten RVS  Examination Guidelines: A complete evaluation includes B-mode imaging, spectral Doppler, color Doppler, and power Doppler as needed of all accessible portions of each vessel. Bilateral testing is considered an integral part of a complete examination. Limited examinations for reoccurring indications may be performed as noted. The reflux portion of the exam is performed with the patient in reverse Trendelenburg.  +---------+---------------+---------+-----------+----------+--------------+ RIGHT    CompressibilityPhasicitySpontaneityPropertiesThrombus Aging +---------+---------------+---------+-----------+----------+--------------+ CFV      Full           Yes      Yes                                 +---------+---------------+---------+-----------+----------+--------------+ SFJ      Full                                                        +---------+---------------+---------+-----------+----------+--------------+ FV Prox  Full                                                        +---------+---------------+---------+-----------+----------+--------------+ FV Mid   Full                                                        +---------+---------------+---------+-----------+----------+--------------+ FV DistalFull                                                        +---------+---------------+---------+-----------+----------+--------------+ PFV      Full                                                        +---------+---------------+---------+-----------+----------+--------------+  POP      Full           Yes      Yes                                 +---------+---------------+---------+-----------+----------+--------------+ PTV      Full                                                        +---------+---------------+---------+-----------+----------+--------------+ PERO     Full           Yes      Yes                                  +---------+---------------+---------+-----------+----------+--------------+   +----+---------------+---------+-----------+----------+--------------+ LEFTCompressibilityPhasicitySpontaneityPropertiesThrombus Aging +----+---------------+---------+-----------+----------+--------------+ CFV Full           Yes      Yes                                 +----+---------------+---------+-----------+----------+--------------+     Summary: RIGHT: - There is no evidence of deep vein thrombosis in the lower extremity.  - No cystic structure found in the popliteal fossa.  LEFT: - No evidence of common femoral vein obstruction.  *See table(s) above for measurements and observations. Electronically signed by Monica Martinez MD on 08/26/2022 at 12:41:31 PM.    Final         Scheduled Meds:  amLODipine  10 mg Oral Daily   carvedilol  25 mg Oral BID WC   diazepam  2 mg Oral QHS   pantoprazole  40 mg Oral Daily   rosuvastatin  5 mg Oral Daily   tiZANidine  2 mg Oral QHS   Continuous Infusions:  sodium chloride 100 mL/hr at 08/28/22 0547          Aline August, MD [facility] 08/28/2022, 9:31 AM

## 2022-08-28 NOTE — PMR Pre-admission (Signed)
PMR Admission Coordinator Pre-Admission Assessment  Patient: Maurice Cruz is an 56 y.o., male MRN: [id number] DOB: July 29, 1966 Height: 5\' 10"  (177.8 cm) Weight: 82.1 kg  Insurance Information HMO:     PPO: yes     PCP:      IPA:      80/20:      OTHER:  PRIMARY: [employer] [place]      Policy#: [id number]      Subscriber: Pt CM Name: TBD      Phone#: [phone]     Fax#: [phone]      Pre-Cert#: [id number]   Received insurance authorization on 08/28/22. Pt approved for 08/29/22-09/11/22. Updates due on 09/11/22.    Employer:  Benefits:  Phone #:      Name:  Dolores Hoose Date: 09/30/2021 - still active Deductible: $500 ($500 met) OOP Max: $1,500 ($1,500 met) CIR: 90% coverage, 10% co-insurance SNF: 90% coverage, 10% co-insurance; with a limit of 100 visits/cal year Outpatient:  90% coverage, 10% co-insurance; combined 60 visit limit combined/cal yr Home Health: 90% coverage, 10% co-insurance; with a limit of 100 visits/cal year DME: 90% coverage, 10% co-insurance Providers: in network  SECONDARY:       Policy#:      Phone#:  Artist:       Phone#:   The Data processing manager" for patients in Inpatient Rehabilitation Facilities with attached "Privacy Act Statement-Health Care Records" was provided and verbally reviewed with: n/a  Emergency Contact Information Contact Information     Name Relation Home Work Mobile   Hamon,CORIN Mother [phone]         Current Medical History  Patient Admitting Diagnosis: AKI, Debility, chronic spinal cord hemorrhage History of Present Illness: Maurice Cruz is a 56 y.o. male with medical history significant of transverse myelitis and followed by Dr. Epimenio Foot with neurology as outpatient since 2021 who presented with increased weakness to [facility] ED 08/25/22.  He has completed extensive rehab with documented deficits including bilateral lower extremity weakness, neurogenic bladder does self-catheterization, neurogenic  bowel, and use of walking cane or walker as needed.  Also has history of anxiety, depression, GERD, hypertension.In the ED, labs showing hemoglobin 11.5, MCV 85.0, BUN 32, creatinine 2.7, ESR 98, CRP 9.5.  MRI revealed chronic emergency T8-10.  Neurosurgery recommended no intervention.  Neurology thought that presentation was because of metabolic stressors. Pt. Admitted for lower extremity weakness and AKI vs CKD stage IV. Pt. Seen by PT/OT and they recommend CIR to assist return to PLOF.     Patient's medical record from [facility] has been reviewed by the rehabilitation admission coordinator and physician.  Past Medical History  Past Medical History:  Diagnosis Date   Anxiety    Asthma    as a child   Depression    ED (erectile dysfunction)    GERD (gastroesophageal reflux disease)    Hx of spinal cord injury 03/2020   Hypertension    Neurogenic bladder    self caths   Neurogenic bowel    has to be digitially stimulated - 04/24/21   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HCC)    bilateral legs   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HCC)     Has the patient had major surgery during 100 days prior to admission? No  Family History   family history includes Cancer in his maternal grandmother.  Current Medications  Current Facility-Administered Medications:    0.9 %  sodium chloride infusion, , Intravenous, Continuous, Alekh, Kshitiz, MD, Last Rate: 75  mL/hr at 08/28/22 1044, Rate Change at 08/28/22 1044   acetaminophen (TYLENOL) tablet 650 mg, 650 mg, Oral, Q6H PRN, 650 mg at 08/27/22 2235 **OR** acetaminophen (TYLENOL) suppository 650 mg, 650 mg, Rectal, Q6H PRN, John Giovanni, MD   amLODipine (NORVASC) tablet 10 mg, 10 mg, Oral, Daily, John Giovanni, MD, 10 mg at 08/28/22 0831   azithromycin (ZITHROMAX) tablet 500 mg, 500 mg, Oral, Daily, Alekh, Kshitiz, MD, 500 mg at 08/28/22 1047   carvedilol (COREG) tablet 25 mg, 25 mg, Oral, BID WC, John Giovanni, MD, 25 mg at 08/28/22 0831    cefTRIAXone (ROCEPHIN) 1 g in sodium chloride 0.9 % 100 mL IVPB, 1 g, Intravenous, Q24H, Alekh, Kshitiz, MD, Last Rate: 200 mL/hr at 08/28/22 1046, 1 g at 08/28/22 1046   colchicine tablet 0.6 mg, 0.6 mg, Oral, Daily, Alekh, Kshitiz, MD, 0.6 mg at 08/28/22 1047   diazepam (VALIUM) tablet 2 mg, 2 mg, Oral, QHS, John Giovanni, MD, 2 mg at 08/27/22 2235   influenza vac split quadrivalent PF (FLUARIX) injection 0.5 mL, 0.5 mL, Intramuscular, Prior to discharge, Noralee Stain, DO   methylPREDNISolone sodium succinate (SOLU-MEDROL) 125 mg/2 mL injection 40 mg, 40 mg, Intravenous, Daily, Alekh, Kshitiz, MD, 40 mg at 08/28/22 1044   pantoprazole (PROTONIX) EC tablet 40 mg, 40 mg, Oral, Daily, John Giovanni, MD, 40 mg at 08/28/22 0831   polyethylene glycol (MIRALAX / GLYCOLAX) packet 17 g, 17 g, Oral, Daily PRN, John Giovanni, MD   rosuvastatin (CRESTOR) tablet 5 mg, 5 mg, Oral, Daily, John Giovanni, MD, 5 mg at 08/28/22 0831   tiZANidine (ZANAFLEX) tablet 2 mg, 2 mg, Oral, QHS, John Giovanni, MD, 2 mg at 08/27/22 2235  Patients Current Diet:  Diet Order             Diet Heart Room service appropriate? Yes; Fluid consistency: Thin  Diet effective now                   Precautions / Restrictions Precautions Precautions: Fall Restrictions Weight Bearing Restrictions: No   Has the patient had 2 or more falls or a fall with injury in the past year? No  Prior Activity Level Limited Community (1-2x/wk): Pt. went out mostly for appointments  Prior Functional Level Self Care: Did the patient need help bathing, dressing, using the toilet or eating? Independent  Indoor Mobility: Did the patient need assistance with walking from room to room (with or without device)? Independent  Stairs: Did the patient need assistance with internal or external stairs (with or without device)? Dependent (has chair lift)  Functional Cognition: Did the patient need help planning regular  tasks such as shopping or remembering to take medications? Independent  Patient Information Are you of Hispanic, Latino/a,or Spanish origin?: A. No, not of Hispanic, Latino/a, or Spanish origin What is your race?: B. Black or African American Do you need or want an interpreter to communicate with a doctor or health care staff?: 0. No  Patient's Response To:  Health Literacy and Transportation Is the patient able to respond to health literacy and transportation needs?: Yes Health Literacy - How often do you need to have someone help you when you read instructions, pamphlets, or other written material from your doctor or pharmacy?: Never In the past 12 months, has lack of transportation kept you from medical appointments or from getting medications?: No In the past 12 months, has lack of transportation kept you from meetings, work, or from getting things needed for daily living?: No  Home Assistive Devices / Equipment Home Assistive Devices/Equipment: Wheelchair, Environmental consultant (specify type), Eyeglasses Home Equipment: Agricultural consultant (2 wheels), Wheelchair - manual, Tub bench  Prior Device Use: Indicate devices/aids used by the patient prior to current illness, exacerbation or injury? Manual wheelchair and Walker  Current Functional Level Cognition  Overall Cognitive Status: Within Functional Limits for tasks assessed Orientation Level: Oriented X4 General Comments: Pt tearful over progression of his weakness and no known cause    Extremity Assessment (includes Sensation/Coordination)  Upper Extremity Assessment: Overall WFL for tasks assessed  Lower Extremity Assessment: Defer to PT evaluation RLE Deficits / Details: Weaker at his baseline compared to L.  Now: ROM WFL, MMT: ankle DF 5/5, knee ext 4/5, hip flexion 1/5 (reports that is fairly normal for that hip) LLE Deficits / Details: Typically his stronger leg; Now : ROM WFL; MMT: ankle DF 5/5, knee ext 4/5, hip flexion 3/5.  Noted very mild  edema and erythema medial knee.  Pt reports mildly tender over medial tibia plateau when palpated    ADLs  Overall ADL's : Needs assistance/impaired Eating/Feeding: Set up, Sitting Grooming: Set up, Sitting Upper Body Bathing: Set up, Sitting, Supervision/ safety Lower Body Bathing: Moderate assistance, Sitting/lateral leans, Sit to/from stand Upper Body Dressing : Set up, Supervision/safety, Sitting Lower Body Dressing: Moderate assistance, Sitting/lateral leans, Sit to/from stand Toilet Transfer: Moderate assistance, Stand-pivot, Rolling walker (2 wheels) Toilet Transfer Details (indicate cue type and reason): simulated with EOB to recliner Toileting- Clothing Manipulation and Hygiene: Moderate assistance, Sitting/lateral lean, Sit to/from stand Tub/ Shower Transfer: Moderate assistance, Stand-pivot General ADL Comments: Pt completed bed mobility and took pivotal steps to access recliner. Mod assist for transfers    Mobility  Overal bed mobility: Needs Assistance Bed Mobility: Supine to Sit Supine to sit: Min assist Sit to supine: Min assist General bed mobility comments: Pt using UEs to advance RLE across bed.  Extra time and effort, min A to steady.    Transfers  Overall transfer level: Needs assistance Equipment used: Rolling walker (2 wheels) Transfers: Sit to/from Stand, Bed to chair/wheelchair/BSC Sit to Stand: Mod assist Bed to/from chair/wheelchair/BSC transfer type:: Stand pivot Stand pivot transfers: Mod assist General transfer comment: Successful on second attempt. Bed elevated, pt with heavy reliance on BUE.  Mod A to powerup and steady and to control descent in to recliner. min A to steady while taking pivotal steps to recliner    Ambulation / Gait / Stairs / Psychologist, prison and probation services  Ambulation/Gait Ambulation/Gait assistance: Editor, commissioning (Feet): 4 Feet Assistive device: Rolling walker (2 wheels) Gait Pattern/deviations: Step-to pattern, Decreased stride  length, Decreased dorsiflexion - left, Decreased dorsiflexion - right, Shuffle General Gait Details: Pt reporting L knee felt like it would buckle; heavy reliance on bil UE; pt sliding feet forward; limited distance due to weakness and spasms Gait velocity: decreased    Posture / Balance Balance Overall balance assessment: Needs assistance Sitting-balance support: No upper extremity supported Sitting balance-Leahy Scale: Good Standing balance support: Bilateral upper extremity supported, Reliant on assistive device for balance Standing balance-Leahy Scale: Poor Standing balance comment: RW and min A    Special needs/care consideration Special service needs none   Previous Home Environment (from acute therapy documentation) Living Arrangements: Parent Available Help at Discharge: Family, Available 24 hours/day Type of Home: House Home Layout: Multi-level Alternate Level Stairs-Number of Steps: has stair lifts on all steps Home Access: Stairs to enter Entrance Stairs-Number of Steps: 1 Bathroom Shower/Tub: Engineer, manufacturing systems:  Standard Bathroom Accessibility:  (can turn RW sideways to get in) Home Care Services: No Additional Comments: stair lifts, doesn't have to use tub bench  Discharge Living Setting Plans for Discharge Living Setting: Patient's home Type of Home at Discharge: House Discharge Home Layout: Multi-level Alternate Level Stairs-Number of Steps: has chair lift for stairs to 2nd and 3rd floors Discharge Home Access: Stairs to enter Entrance Stairs-Rails: None Entrance Stairs-Number of Steps: 1 Discharge Bathroom Shower/Tub: Tub/shower unit Discharge Bathroom Toilet: Standard Discharge Bathroom Accessibility: Yes Does the patient have any problems obtaining your medications?: No  Social/Family/Support Systems Patient Roles: Parent Contact Information: [phone] Anticipated Caregiver: Vanetta Mulders Anticipated Caregiver's Contact Information:  [phone] Ability/Limitations of Caregiver: Supervision to light min A Caregiver Availability: 24/7 Discharge Plan Discussed with Primary Caregiver: Yes Is Caregiver In Agreement with Plan?: No Does Caregiver/Family have Issues with Lodging/Transportation while Pt is in Rehab?: No  Goals Patient/Family Goal for Rehab: PT/OT Supervision Expected length of stay: 12-14 days Pt/Family Agrees to Admission and willing to participate: Yes Program Orientation Provided & Reviewed with Pt/Caregiver Including Roles  & Responsibilities: Yes  Decrease burden of Care through IP rehab admission: n/a  Possible need for SNF placement upon discharge: none   Patient Condition: I have reviewed medical records from [facility] , spoken with CM, and patient. I met with patient at the bedside for inpatient rehabilitation assessment.  Patient will benefit from ongoing PT and OT, can actively participate in 3 hours of therapy a day 5 days of the week, and can make measurable gains during the admission.  Patient will also benefit from the coordinated team approach during an Inpatient Acute Rehabilitation admission.  The patient will receive intensive therapy as well as Rehabilitation physician, nursing, social worker, and care management interventions.  Due to safety, skin/wound care, disease management, medication administration, pain management, and patient education the patient requires 24 hour a day rehabilitation nursing.  The patient is currently Min-Mod A with mobility and Supervision-Mod A with basic ADLs.  Discharge setting and therapy post discharge at home with home health is anticipated.  Patient has agreed to participate in the Acute Inpatient Rehabilitation Program and will admit today.  Preadmission Screen Completed By:  Jeronimo Greaves, with updates by Wolfgang Phoenix 08/28/2022 12:39 PM ______________________________________________________________________   Discussed status with  Dr. Riley Kill on 08/29/22  at 10:44 AM and received approval for admission today.  Admission Coordinator:  Jeronimo Greaves, CCC-SLP, with updates by Wolfgang Phoenix, MS, CCC-SLP time 10:44 AM/Date 08/29/22    Assessment/Plan: Diagnosis: debility due to multiple medical in paraplegic patient Does the need for close, 24 hr/day Medical supervision in concert with the patient's rehab needs make it unreasonable for this patient to be served in a less intensive setting? Yes Co-Morbidities requiring supervision/potential complications: neurogenic bowel and bladder, anxiety, htn  Due to bladder management, bowel management, safety, skin/wound care, disease management, medication administration, pain management, and patient education, does the patient require 24 hr/day rehab nursing? Yes Does the patient require coordinated care of a physician, rehab nurse, PT, OT to address physical and functional deficits in the context of the above medical diagnosis(es)? Yes Addressing deficits in the following areas: balance, endurance, locomotion, strength, transferring, bowel/bladder control, bathing, dressing, feeding, grooming, toileting, and psychosocial support Can the patient actively participate in an intensive therapy program of at least 3 hrs of therapy 5 days a week? Yes The potential for patient to make measurable gains while on inpatient rehab  is excellent Anticipated functional outcomes upon discharge from inpatient rehab: supervision PT, supervision OT, n/a SLP Estimated rehab length of stay to reach the above functional goals is: 12-14 days Anticipated discharge destination: Home 10. Overall Rehab/Functional Prognosis: excellent   MD Signature: Ranelle Oyster, MD, [facility] Physical Medicine & Rehabilitation Medical Director Rehabilitation Services 08/29/2022

## 2022-08-29 ENCOUNTER — Inpatient Hospital Stay (HOSPITAL_COMMUNITY)
Admission: RE | Admit: 2022-08-29 | Discharge: 2022-09-10 | DRG: 945 | Disposition: A | Discharge status: Home / Self Care | Payer: BC Managed Care – PPO | Source: Intra-hospital | Attending: Physical Medicine & Rehabilitation | Admitting: Physical Medicine & Rehabilitation

## 2022-08-29 ENCOUNTER — Other Ambulatory Visit: Payer: Self-pay

## 2022-08-29 ENCOUNTER — Encounter (HOSPITAL_COMMUNITY): Payer: Self-pay | Admitting: Physical Medicine & Rehabilitation

## 2022-08-29 DIAGNOSIS — R5381 Other malaise: Secondary | ICD-10-CM

## 2022-08-29 DIAGNOSIS — M10371 Gout due to renal impairment, right ankle and foot: Secondary | ICD-10-CM | POA: Diagnosis not present

## 2022-08-29 DIAGNOSIS — N184 Chronic kidney disease, stage 4 (severe): Secondary | ICD-10-CM | POA: Diagnosis present

## 2022-08-29 DIAGNOSIS — E785 Hyperlipidemia, unspecified: Secondary | ICD-10-CM | POA: Diagnosis present

## 2022-08-29 DIAGNOSIS — G9519 Other vascular myelopathies: Secondary | ICD-10-CM | POA: Diagnosis present

## 2022-08-29 DIAGNOSIS — F4321 Adjustment disorder with depressed mood: Secondary | ICD-10-CM | POA: Diagnosis present

## 2022-08-29 DIAGNOSIS — K219 Gastro-esophageal reflux disease without esophagitis: Secondary | ICD-10-CM | POA: Diagnosis present

## 2022-08-29 DIAGNOSIS — I129 Hypertensive chronic kidney disease with stage 1 through stage 4 chronic kidney disease, or unspecified chronic kidney disease: Secondary | ICD-10-CM | POA: Diagnosis present

## 2022-08-29 DIAGNOSIS — J45909 Unspecified asthma, uncomplicated: Secondary | ICD-10-CM | POA: Diagnosis present

## 2022-08-29 DIAGNOSIS — N319 Neuromuscular dysfunction of bladder, unspecified: Secondary | ICD-10-CM | POA: Diagnosis present

## 2022-08-29 DIAGNOSIS — N179 Acute kidney failure, unspecified: Secondary | ICD-10-CM | POA: Diagnosis present

## 2022-08-29 DIAGNOSIS — D638 Anemia in other chronic diseases classified elsewhere: Secondary | ICD-10-CM

## 2022-08-29 DIAGNOSIS — K5900 Constipation, unspecified: Secondary | ICD-10-CM | POA: Diagnosis present

## 2022-08-29 DIAGNOSIS — K592 Neurogenic bowel, not elsewhere classified: Secondary | ICD-10-CM | POA: Diagnosis present

## 2022-08-29 DIAGNOSIS — J189 Pneumonia, unspecified organism: Secondary | ICD-10-CM | POA: Diagnosis present

## 2022-08-29 DIAGNOSIS — I10 Essential (primary) hypertension: Secondary | ICD-10-CM | POA: Diagnosis not present

## 2022-08-29 DIAGNOSIS — G959 Disease of spinal cord, unspecified: Secondary | ICD-10-CM | POA: Diagnosis present

## 2022-08-29 DIAGNOSIS — Z79899 Other long term (current) drug therapy: Secondary | ICD-10-CM | POA: Diagnosis not present

## 2022-08-29 DIAGNOSIS — Z993 Dependence on wheelchair: Secondary | ICD-10-CM | POA: Diagnosis not present

## 2022-08-29 DIAGNOSIS — D72829 Elevated white blood cell count, unspecified: Secondary | ICD-10-CM | POA: Diagnosis not present

## 2022-08-29 DIAGNOSIS — G8220 Paraplegia, unspecified: Secondary | ICD-10-CM | POA: Diagnosis present

## 2022-08-29 DIAGNOSIS — D631 Anemia in chronic kidney disease: Secondary | ICD-10-CM | POA: Diagnosis present

## 2022-08-29 DIAGNOSIS — M109 Gout, unspecified: Secondary | ICD-10-CM

## 2022-08-29 LAB — BASIC METABOLIC PANEL
Anion gap: 12 (ref 5–15)
BUN: 24 mg/dL — ABNORMAL HIGH (ref 6–20)
CO2: 19 mmol/L — ABNORMAL LOW (ref 22–32)
Calcium: 9.3 mg/dL (ref 8.9–10.3)
Chloride: 108 mmol/L (ref 98–111)
Creatinine, Ser: 1.75 mg/dL — ABNORMAL HIGH (ref 0.61–1.24)
GFR, Estimated: 46 mL/min — ABNORMAL LOW (ref >60)
Glucose, Bld: 124 mg/dL — ABNORMAL HIGH (ref 70–99)
Potassium: 4.3 mmol/L (ref 3.5–5.1)
Sodium: 139 mmol/L (ref 135–145)

## 2022-08-29 LAB — CBC WITH DIFFERENTIAL/PLATELET
Abs Immature Granulocytes: 0.07 10*3/uL (ref 0.00–0.07)
Basophils Absolute: 0 10*3/uL (ref 0.0–0.1)
Basophils Relative: 0 %
Eosinophils Absolute: 0 10*3/uL (ref 0.0–0.5)
Eosinophils Relative: 0 %
HCT: 28.9 % — ABNORMAL LOW (ref 39.0–52.0)
Hemoglobin: 9.7 g/dL — ABNORMAL LOW (ref 13.0–17.0)
Immature Granulocytes: 1 %
Lymphocytes Relative: 8 %
Lymphs Abs: 1.3 10*3/uL (ref 0.7–4.0)
MCH: 27.9 pg (ref 26.0–34.0)
MCHC: 33.6 g/dL (ref 30.0–36.0)
MCV: 83 fL (ref 80.0–100.0)
Monocytes Absolute: 0.7 10*3/uL (ref 0.1–1.0)
Monocytes Relative: 5 %
Neutro Abs: 13 10*3/uL — ABNORMAL HIGH (ref 1.7–7.7)
Neutrophils Relative %: 86 %
Platelets: 332 10*3/uL (ref 150–400)
RBC: 3.48 MIL/uL — ABNORMAL LOW (ref 4.22–5.81)
RDW: 13.1 % (ref 11.5–15.5)
WBC: 15.1 10*3/uL — ABNORMAL HIGH (ref 4.0–10.5)
nRBC: 0 % (ref 0.0–0.2)

## 2022-08-29 LAB — MAGNESIUM: Magnesium: 2.2 mg/dL (ref 1.7–2.4)

## 2022-08-29 MED ORDER — ROSUVASTATIN CALCIUM 5 MG PO TABS
5.0000 mg | ORAL_TABLET | Freq: Every day | ORAL | Status: DC
  Administered 2022-08-30 – 2022-09-10 (×12): 5 mg via ORAL
  Filled 2022-08-29 (×12): qty 1

## 2022-08-29 MED ORDER — DIAZEPAM 2 MG PO TABS: 2.0000 mg | ORAL_TABLET | Freq: Every day | ORAL | Status: DC

## 2022-08-29 MED ORDER — TIZANIDINE HCL 4 MG PO TABS: 2.0000 mg | ORAL_TABLET | Freq: Every day | ORAL | Status: DC

## 2022-08-29 MED ORDER — TRAZODONE HCL 50 MG PO TABS: 25.0000 mg | ORAL_TABLET | Freq: Every evening | ORAL | Status: DC | PRN

## 2022-08-29 MED ORDER — AZITHROMYCIN 500 MG PO TABS
500.0000 mg | ORAL_TABLET | Freq: Every day | ORAL | Status: AC
  Administered 2022-08-30 – 2022-09-01 (×3): 500 mg via ORAL
  Filled 2022-08-29 (×3): qty 1

## 2022-08-29 MED ORDER — PROCHLORPERAZINE MALEATE 5 MG PO TABS: 5.0000 mg | ORAL_TABLET | Freq: Four times a day (QID) | ORAL | Status: DC | PRN

## 2022-08-29 MED ORDER — CARVEDILOL 25 MG PO TABS
25.0000 mg | ORAL_TABLET | Freq: Two times a day (BID) | ORAL | Status: DC
  Administered 2022-08-29 – 2022-09-10 (×24): 25 mg via ORAL
  Filled 2022-08-29 (×24): qty 1

## 2022-08-29 MED ORDER — COLCHICINE 0.6 MG PO TABS
0.6000 mg | ORAL_TABLET | Freq: Every day | ORAL | Status: DC
  Administered 2022-08-30 – 2022-09-10 (×12): 0.6 mg via ORAL
  Filled 2022-08-29 (×12): qty 1

## 2022-08-29 MED ORDER — PANTOPRAZOLE SODIUM 40 MG PO TBEC
40.0000 mg | DELAYED_RELEASE_TABLET | Freq: Every day | ORAL | Status: DC
  Administered 2022-08-30 – 2022-09-10 (×12): 40 mg via ORAL
  Filled 2022-08-29 (×12): qty 1

## 2022-08-29 MED ORDER — DIAZEPAM 2 MG PO TABS
2.0000 mg | ORAL_TABLET | Freq: Every day | ORAL | Status: DC
  Administered 2022-08-29 – 2022-09-09 (×12): 2 mg via ORAL
  Filled 2022-08-29 (×12): qty 1

## 2022-08-29 MED ORDER — GUAIFENESIN-DM 100-10 MG/5ML PO SYRP
5.0000 mL | ORAL_SOLUTION | Freq: Four times a day (QID) | ORAL | Status: DC | PRN
  Administered 2022-09-04: 10 mL via ORAL
  Filled 2022-08-29: qty 10

## 2022-08-29 MED ORDER — PREDNISONE 20 MG PO TABS: 40.0000 mg | ORAL_TABLET | Freq: Every day | ORAL | 0 refills | Status: DC

## 2022-08-29 MED ORDER — METHOCARBAMOL 500 MG PO TABS: 500.0000 mg | ORAL_TABLET | Freq: Four times a day (QID) | ORAL | Status: DC | PRN

## 2022-08-29 MED ORDER — AMLODIPINE BESYLATE 10 MG PO TABS
10.0000 mg | ORAL_TABLET | Freq: Every day | ORAL | Status: DC
  Administered 2022-08-30 – 2022-09-10 (×12): 10 mg via ORAL
  Filled 2022-08-29 (×12): qty 1

## 2022-08-29 MED ORDER — CEFUROXIME AXETIL 500 MG PO TABS: 500.0000 mg | ORAL_TABLET | Freq: Two times a day (BID) | ORAL | Status: DC

## 2022-08-29 MED ORDER — CEFDINIR 300 MG PO CAPS
600.0000 mg | ORAL_CAPSULE | Freq: Every day | ORAL | Status: AC
  Administered 2022-08-30 – 2022-09-01 (×3): 600 mg via ORAL
  Filled 2022-08-29 (×3): qty 2

## 2022-08-29 MED ORDER — DIPHENHYDRAMINE HCL 12.5 MG/5ML PO ELIX: 12.5000 mg | ORAL_SOLUTION | Freq: Four times a day (QID) | ORAL | Status: DC | PRN

## 2022-08-29 MED ORDER — MAGNESIUM HYDROXIDE 400 MG/5ML PO SUSP
30.0000 mL | Freq: Every day | ORAL | Status: DC | PRN
  Filled 2022-08-29: qty 30

## 2022-08-29 MED ORDER — SODIUM CHLORIDE 0.9 % IV SOLN: INTRAVENOUS | Status: DC

## 2022-08-29 MED ORDER — ACETAMINOPHEN 325 MG PO TABS
325.0000 mg | ORAL_TABLET | ORAL | Status: DC | PRN
  Administered 2022-09-03 – 2022-09-09 (×5): 650 mg via ORAL
  Filled 2022-08-29 (×5): qty 2

## 2022-08-29 MED ORDER — ALLOPURINOL 100 MG PO TABS: 100.0000 mg | ORAL_TABLET | Freq: Every day | ORAL | Status: DC

## 2022-08-29 MED ORDER — PREDNISONE 20 MG PO TABS
40.0000 mg | ORAL_TABLET | Freq: Every day | ORAL | Status: AC
  Administered 2022-08-30 – 2022-09-03 (×5): 40 mg via ORAL
  Filled 2022-08-29 (×5): qty 2

## 2022-08-29 MED ORDER — SORBITOL 70 % SOLN: 30.0000 mL | Freq: Every day | Status: DC | PRN

## 2022-08-29 MED ORDER — COLCHICINE 0.6 MG PO TABS: 0.6000 mg | ORAL_TABLET | Freq: Every day | ORAL | Status: DC

## 2022-08-29 MED ORDER — PROCHLORPERAZINE 25 MG RE SUPP: 12.5000 mg | Freq: Four times a day (QID) | RECTAL | Status: DC | PRN

## 2022-08-29 MED ORDER — FLEET ENEMA 7-19 GM/118ML RE ENEM: 1.0000 | ENEMA | Freq: Once | RECTAL | Status: DC | PRN

## 2022-08-29 MED ORDER — PROCHLORPERAZINE EDISYLATE 10 MG/2ML IJ SOLN: 5.0000 mg | Freq: Four times a day (QID) | INTRAMUSCULAR | Status: DC | PRN

## 2022-08-29 MED ORDER — TIZANIDINE HCL 4 MG PO TABS
2.0000 mg | ORAL_TABLET | Freq: Every day | ORAL | Status: DC
  Administered 2022-08-29 – 2022-09-09 (×12): 2 mg via ORAL
  Filled 2022-08-29 (×12): qty 1

## 2022-08-29 MED ORDER — ALUM & MAG HYDROXIDE-SIMETH 200-200-20 MG/5ML PO SUSP
30.0000 mL | ORAL | Status: DC | PRN
  Filled 2022-08-29: qty 30

## 2022-08-29 NOTE — H&P (Signed)
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 Initially diagnosed while living in [place] in 2020 with BLE weakness. 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 He presented to [facility] ED on 08/25/2022 complaining of several history of BLE weakness, left greater than right. NS and neurology consulted. MRI with chronic changes. Neurology confirmed no TM diagnosis  and MRI of complete spine negative for cord compression and stable cord hemorrhage as compared to July 2022. The patient is followed as outpatient with Dr. [name]. AKI atop CKD noted. Lab work-up, LE venous duplex performed. Complained of right ankle pain on 11/27 and started on colchicine for acute gout flare. Also complained of lingering cough and had mild fever to 100.6. Chest x-ray and blood cultures performed. Possible LLL pneumonia and started on Rocephin and Zithromax on 11/29. Switching to Ceftin for 3 more days today, 11/30. Tolerating HH diet. 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   Has history of neurogenic bowel and bladder and self-caths. History of hyperlipidemia, CKD, hypertension, GERD.   Follows with Drs. Sater and Lovorn as outpatient.   Review of Systems  Constitutional:  Negative for chills and fever.  HENT:  Negative for ear pain.   Eyes:  Negative for blurred vision and double vision.  Respiratory:  Negative for cough.   Cardiovascular:  Negative for chest pain and palpitations.  Gastrointestinal:  Negative for heartburn, nausea and vomiting.  Genitourinary:  Negative for dysuria.   Musculoskeletal:  Positive for joint pain. Negative for myalgias.  Skin:  Negative for itching.  Neurological:  Positive for focal weakness and weakness. Negative for dizziness.  Psychiatric/Behavioral:  Negative for depression.         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[place])           Past Surgical History:  Procedure Laterality Date   Anorectal biospy   02/09/2021   Colon Biospy   02/09/2021   IR ANGIO INTRA EXTRACRAN SEL COM CAROTID INNOMINATE BILAT MOD SED   04/25/2021   IR ANGIO VERTEBRAL SEL SUBCLAVIAN INNOMINATE UNI L MOD SED   04/25/2021   IR ANGIO VERTEBRAL SEL VERTEBRAL UNI R MOD SED   04/25/2021   IR ANGIO/SPINAL LEFT   04/25/2021   IR ANGIO/SPINAL LEFT   04/25/2021   IR ANGIO/SPINAL LEFT   04/25/2021   IR ANGIO/SPINAL LEFT   04/25/2021   IR ANGIO/SPINAL LEFT   04/25/2021   IR ANGIO/SPINAL LEFT   04/25/2021   IR ANGIO/SPINAL LEFT   04/25/2021   IR ANGIO/SPINAL LEFT   04/25/2021   IR ANGIO/SPINAL LEFT   04/25/2021  IR ANGIO/SPINAL LEFT   04/25/2021   IR ANGIO/SPINAL LEFT   04/25/2021   IR ANGIO/SPINAL LEFT   04/25/2021   IR ANGIO/SPINAL LEFT   04/25/2021   IR ANGIO/SPINAL RIGHT   04/25/2021   IR ANGIO/SPINAL RIGHT   04/25/2021   IR ANGIO/SPINAL RIGHT   04/25/2021   IR ANGIO/SPINAL RIGHT   04/25/2021   IR ANGIO/SPINAL RIGHT   04/25/2021   IR ANGIO/SPINAL RIGHT   04/25/2021   IR ANGIO/SPINAL RIGHT   04/25/2021   IR ANGIO/SPINAL RIGHT   04/25/2021   IR ANGIO/SPINAL RIGHT   04/25/2021   IR ANGIO/SPINAL RIGHT   04/25/2021   IR ANGIO/SPINAL RIGHT   04/25/2021   IR ANGIO/SPINAL RIGHT   04/25/2021   IR ANGIO/SPINAL RIGHT   04/25/2021   IR  ANGIO/SPINAL RIGHT   04/25/2021   IR ANGIOGRAM EXTREMITY BILATERAL   04/25/2021   IR RADIOLOGIST EVAL & MGMT   03/21/2021   RADIOLOGY WITH ANESTHESIA N/A 04/25/2021    Procedure: IR WITH ANESTHESIA SPINAL ANGIOGRAM;  Surgeon: Luanne Bras, MD;  Location: [place];  Service: Radiology;  Laterality: N/A;         Family History  Problem Relation Age of Onset   Cancer Maternal Grandmother      Social History:  reports that he has never smoked. He has never used smokeless tobacco. He reports that he does not currently use alcohol. He reports that he does not use drugs. Allergies: No Known Allergies       Medications Prior to Admission  Medication Sig Dispense Refill   amLODipine (NORVASC) 10 MG tablet Take 10 mg by mouth daily.       CALCIUM POLYCARBOPHIL PO Mix 63ml into 8 oz of a beverage and drink daily       carvedilol (COREG) 25 MG tablet Take 25 mg by mouth 2 (two) times daily.       Magnesium 400 MG TABS Take 400 mg by mouth at bedtime.       Multiple Vitamins-Minerals (CENTRUM MEN PO) Take 1 tablet by mouth daily.       pantoprazole (PROTONIX) 40 MG tablet Take 40 mg by mouth daily.       polyethylene glycol (MIRALAX / GLYCOLAX) 17 g packet Take 17 g by mouth daily.       rosuvastatin (CRESTOR) 5 MG tablet Take 5 mg by mouth daily.       sildenafil (VIAGRA) 100 MG tablet Take 100 mg by mouth See admin instructions. Take 100 mg every 3 days as needed for erectile dysfunction       torsemide (DEMADEX) 10 MG tablet Take 10 mg by mouth daily.       traMADol (ULTRAM) 50 MG tablet Take 1 tablet (50 mg total) by mouth every 6 (six) hours as needed. (Patient taking differently: Take 50 mg by mouth daily as needed for moderate pain.) 75 tablet 5   valsartan (DIOVAN) 320 MG tablet Take 320 mg by mouth daily.       [DISCONTINUED] diazepam (VALIUM) 2 MG tablet TAKE 1 TABLET BY MOUTH IN THE MORNING AND AT BEDTIME (Patient taking differently: Take 2 mg by mouth at bedtime.) 60 tablet 5    [DISCONTINUED] tiZANidine (ZANAFLEX) 4 MG tablet TAKE 1/2 TO 1 (ONE-HALF TO ONE) TABLET BY MOUTH TWICE DAILY (Patient taking differently: Take 2 mg by mouth at bedtime.) 60 tablet 5          Home: Home Living Family/patient expects to be discharged  to:: Private residence Living Arrangements: Parent Available Help at Discharge: Family, Available 24 hours/day Type of Home: House Home Access: Stairs to enter Technical brewer of Steps: 1 Home Layout: Multi-level Alternate Level Stairs-Number of Steps: has stair lifts on all steps Bathroom Shower/Tub: Optometrist:  (can turn RW sideways to get in) Home Equipment: Conservation officer, nature (2 wheels), Wheelchair - manual, Tub bench Additional Comments: stair lifts, doesn't have to use tub bench   Functional History: Prior Function Prior Level of Function : Independent/Modified Independent Mobility Comments: ambulates with RW for short community distances but if longer will take w/c ADLs Comments: mod I (walker) for ADLs and light ADLs   Functional Status:  Mobility: Bed Mobility Overal bed mobility: Needs Assistance Bed Mobility: Supine to Sit Supine to sit: Min assist Sit to supine: Min assist General bed mobility comments: in chair on arrival and end of session Transfers Overall transfer level: Needs assistance Equipment used: Rolling walker (2 wheels) Transfers: Sit to/from Stand Sit to Stand: Min guard Bed to/from chair/wheelchair/BSC transfer type:: Squat pivot Stand pivot transfers: Mod assist Squat pivot transfers: Modified independent (Device/Increase time) General transfer comment: guarding for safety with pt able to stand to/from recliner and WC. Squat pivot WC to recliner with pt performing setup and transfer without assist Ambulation/Gait Ambulation/Gait assistance: Min assist Gait Distance (Feet): 30 Feet Assistive device: Rolling walker (2 wheels) Gait  Pattern/deviations: Step-through pattern, Decreased stride length, Decreased stance time - right, Narrow base of support General Gait Details: circumduction RLE due to lack of hip and knee flexion. Reliance on bil UE support Gait velocity: decreased Gait velocity interpretation: <1.8 ft/sec, indicate of risk for recurrent falls   ADL: ADL Overall ADL's : Needs assistance/impaired Eating/Feeding: Set up, Sitting Grooming: Set up, Sitting Upper Body Bathing: Set up, Sitting, Supervision/ safety Lower Body Bathing: Moderate assistance, Sitting/lateral leans, Sit to/from stand Upper Body Dressing : Set up, Supervision/safety, Sitting Lower Body Dressing: Moderate assistance, Sitting/lateral leans, Sit to/from stand Toilet Transfer: Moderate assistance, Stand-pivot, Rolling walker (2 wheels) Toilet Transfer Details (indicate cue type and reason): simulated with EOB to recliner Toileting- Clothing Manipulation and Hygiene: Moderate assistance, Sitting/lateral lean, Sit to/from stand Tub/ Shower Transfer: Moderate assistance, Stand-pivot General ADL Comments: Pt completed bed mobility and took pivotal steps to access recliner. Mod assist for transfers   Cognition: Cognition Overall Cognitive Status: Within Functional Limits for tasks assessed Orientation Level: Oriented X4 Cognition Arousal/Alertness: Awake/alert Behavior During Therapy: WFL for tasks assessed/performed Overall Cognitive Status: Within Functional Limits for tasks assessed General Comments: Pt tearful over progression of his weakness and no known cause   Physical Exam: Blood pressure 135/77, pulse 73, temperature 98.3 F (36.8 C), temperature source Oral, resp. rate 16, height 5\' 10"  (1.778 m), weight 82.1 kg, SpO2 99 %. Physical Exam Constitutional:      Appearance: Normal appearance.  HENT:     Head: Normocephalic and atraumatic.     Right Ear: External ear normal.     Left Ear: External ear normal.     Nose: Nose  normal.     Mouth/Throat:     Mouth: Mucous membranes are moist.     Pharynx: Oropharynx is clear.  Eyes:     Extraocular Movements: Extraocular movements intact.     Conjunctiva/sclera: Conjunctivae normal.     Pupils: Pupils are equal, round, and reactive to light.  Cardiovascular:     Rate and Rhythm: Normal rate and regular rhythm.  Heart sounds: No murmur heard.    No gallop.  Pulmonary:     Effort: Pulmonary effort is normal. No respiratory distress.     Breath sounds: No wheezing.  Abdominal:     General: Bowel sounds are normal. There is no distension.     Palpations: Abdomen is soft.     Tenderness: There is no abdominal tenderness.  Musculoskeletal:        General: No swelling or tenderness.     Cervical back: Normal range of motion.     Comments: Right ankle discomfort  Skin:    General: Skin is warm and dry.  Neurological:     Mental Status: He is alert.     Comments: Alert and oriented x 3. Normal insight and awareness. Intact Memory. Normal language and speech. Cranial nerve exam unremarkable. UE Motor 5/5 . RLE 1 to 1+ HF, KE, KF and 2+ to 3-/5 ADF/P.  LLE 3-HF, 3/5 KE, KF and 3 to 3+ ADF/PF. Approximately T10 sensory level but is able to sense pain and gross touch below that level. DTR's 3+ in LE's. Minimal LE resting hypertonia  Psychiatric:        Mood and Affect: Mood normal.        Behavior: Behavior normal.        Lab Results Last 48 Hours        Results for orders placed or performed during the hospital encounter of 08/25/22 (from the past 48 hour(s))  C Difficile Quick Screen w PCR reflex     Status: None    Collection Time: 08/27/22 12:37 PM    Specimen: STOOL  Result Value Ref Range    C Diff antigen NEGATIVE NEGATIVE    C Diff toxin NEGATIVE NEGATIVE    C Diff interpretation No C. difficile detected.        Comment: Performed at [facility], [place] [address], [place], [place] [address]  Culture, blood (Routine X 2) w Reflex to ID Panel      Status: None (Preliminary result)    Collection Time: 08/27/22  3:49 PM    Specimen: BLOOD RIGHT HAND  Result Value Ref Range    Specimen Description BLOOD RIGHT HAND      Special Requests          BOTTLES DRAWN AEROBIC AND ANAEROBIC Blood Culture adequate volume    Culture          NO GROWTH 2 DAYS Performed at [facility], [place] [address], [place], [place] [address]      Report Status PENDING    Culture, blood (Routine X 2) w Reflex to ID Panel     Status: None (Preliminary result)    Collection Time: 08/27/22  3:51 PM    Specimen: BLOOD LEFT ARM  Result Value Ref Range    Specimen Description BLOOD LEFT ARM      Special Requests          BOTTLES DRAWN AEROBIC AND ANAEROBIC Blood Culture adequate volume    Culture          NO GROWTH 2 DAYS Performed at [facility], [place] [address] [place], [place] [address]      Report Status PENDING    Basic metabolic panel     Status: Abnormal    Collection Time: 08/28/22  5:55 AM  Result Value Ref Range    Sodium 136 135 - 145 mmol/L    Potassium 4.3 3.5 -  5.1 mmol/L    Chloride 109 98 - 111 mmol/L    CO2 20 (L) 22 - 32 mmol/L    Glucose, Bld 97 70 - 99 mg/dL      Comment: Glucose reference range applies only to samples taken after fasting for at least 8 hours.    BUN 23 (H) 6 - 20 mg/dL    Creatinine, Ser 1.93 (H) 0.61 - 1.24 mg/dL    Calcium 8.4 (L) 8.9 - 10.3 mg/dL    GFR, Estimated 41 (L) >60 mL/min      Comment: (NOTE) Calculated using the CKD-EPI Creatinine Equation (2021)      Anion gap 7 5 - 15      Comment: Performed at [facility] [address], [place], [place] [address]  CBC with Differential/Platelet     Status: Abnormal    Collection Time: 08/29/22  7:14 AM  Result Value Ref Range    WBC 15.1 (H) 4.0 - 10.5 K/uL    RBC 3.48 (L) 4.22 - 5.81 MIL/uL    Hemoglobin 9.7 (L) 13.0 - 17.0 g/dL    HCT 28.9 (L) 39.0 - 52.0 %    MCV 83.0 80.0 - 100.0 fL    MCH 27.9 26.0 - 34.0 pg    MCHC 33.6 30.0 -  36.0 g/dL    RDW 13.1 11.5 - 15.5 %    Platelets 332 150 - 400 K/uL    nRBC 0.0 0.0 - 0.2 %    Neutrophils Relative % 86 %    Neutro Abs 13.0 (H) 1.7 - 7.7 K/uL    Lymphocytes Relative 8 %    Lymphs Abs 1.3 0.7 - 4.0 K/uL    Monocytes Relative 5 %    Monocytes Absolute 0.7 0.1 - 1.0 K/uL    Eosinophils Relative 0 %    Eosinophils Absolute 0.0 0.0 - 0.5 K/uL    Basophils Relative 0 %    Basophils Absolute 0.0 0.0 - 0.1 K/uL    Immature Granulocytes 1 %    Abs Immature Granulocytes 0.07 0.00 - 0.07 K/uL      Comment: Performed at [facility], [place] [address], [place], [place] [address]  Basic metabolic panel     Status: Abnormal    Collection Time: 08/29/22  7:14 AM  Result Value Ref Range    Sodium 139 135 - 145 mmol/L    Potassium 4.3 3.5 - 5.1 mmol/L    Chloride 108 98 - 111 mmol/L    CO2 19 (L) 22 - 32 mmol/L    Glucose, Bld 124 (H) 70 - 99 mg/dL      Comment: Glucose reference range applies only to samples taken after fasting for at least 8 hours.    BUN 24 (H) 6 - 20 mg/dL    Creatinine, Ser 1.75 (H) 0.61 - 1.24 mg/dL    Calcium 9.3 8.9 - 10.3 mg/dL    GFR, Estimated 46 (L) >60 mL/min      Comment: (NOTE) Calculated using the CKD-EPI Creatinine Equation (2021)      Anion gap 12 5 - 15      Comment: Performed at [facility] [address] [place], [place] [address]  Magnesium     Status: None    Collection Time: 08/29/22  7:14 AM  Result Value Ref Range    Magnesium 2.2 1.7 - 2.4 mg/dL      Comment: Performed at [facility]  [address] [place], [place] [address]       Imaging Results (Last 48 hours)  DG CHEST PORT 1 VIEW   Result Date: 08/27/2022 CLINICAL DATA:  Cough and fever EXAM: PORTABLE CHEST 1 VIEW COMPARISON:  None Available. FINDINGS: Negative for heart failure. Right lung is clear. Possible early airspace disease left lower lobe which could be atelectasis or infiltrate. No effusion IMPRESSION: Possible left lower lobe atelectasis  or infiltrate. Electronically Signed   By: Franchot Gallo M.D.   On: 08/27/2022 16:53    DG Ankle Complete Right   Result Date: 08/27/2022 CLINICAL DATA:  Gout.  Ankle swelling EXAM: RIGHT ANKLE - COMPLETE 3+ VIEW COMPARISON:  None Available. FINDINGS: Normal alignment. No fracture. No erosion or joint space narrowing. Small joint effusion. IMPRESSION: Small joint effusion. No fracture or joint space narrowing. Electronically Signed   By: Franchot Gallo M.D.   On: 08/27/2022 16:52           Blood pressure 135/77, pulse 73, temperature 98.3 F (36.8 C), temperature source Oral, resp. rate 16, height 5\' 10"  (1.778 m), weight 82.1 kg, SpO2 99 %.   Medical Problem List and Plan: 1. 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              -pt is clinically a T10 level. 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  Antithrombotics: -DVT/anticoagulation:  Mechanical:  Antiembolism stockings, knee (TED hose) Bilateral lower extremities             -antiplatelet therapy: none 3. Pain Management: Tylenol as needed 4. Mood/Behavior/Sleep: LCSW to evaluate and provide emotional support             -antipsychotic agents: n/a 5. Neuropsych/cognition: This patient is capable of making decisions on his own behalf. 6. Skin/Wound Care: Routine skin care checks 7. Fluids/Electrolytes/Nutrition: Routine Is and Os and follow-up chemistries on admit   8. AKI on CKD: creatinine is improving/? at baseline             -follow-up BMP tomorrow   9. Gout: acute flare 11/29--appears to be in less pain today             -continue colchicine 0.6 mg daily             -transition to prednisone 40 mg for 7 days             -started on allopurinol today for prophylaxis   10. Hypertension             -continue Norvasc 10 mg daily             -continue Coreg 25 mg  BID             -bp currently controlled 11. Spasticity: continue Valium 2 mg q HS, Zanaflex 2 mg q HS             -these seem to be working for him, minimal resting LE tone 12. GERD: continue Protonix   13. Hyperlipidemia: continue Crestor   14. LLL pneumonia: transition to Ceftin today for 3 days             -IS/FV             -OOB 15. Anemia/chronic  disease/dilutional: follow-up CBC   16. Leukocytosis: on steroids and getting abx for pneumonia; currently afebrile             -follow-up CBC 17. Neurogenic bladder:             -pt self-caths 3-4 x per day 18. Pt not on an established bowel program. He uses miralax daily and has a bm every 2-3 days. He says he's tried suppositories, dig stim, enemas, etc and they didn't work for him. He had an incontinent bm this morning             -continue miralax per home regimen       Barbie Banner, PA-C 08/29/2022  I have personally performed a face to face diagnostic evaluation of this patient and formulated the key components of the plan.  Additionally, I have personally reviewed laboratory data, imaging studies, as well as relevant notes and concur with the physician assistant's documentation above.  The patient's status has not changed from the original H&P.  Any changes in documentation from the acute care chart have been noted above.  Meredith Staggers, MD, [facility]

## 2022-08-29 NOTE — Progress Notes (Signed)
Inpatient Rehab Admissions Coordinator:  There is a bed available in CIR for pt today. Dr. Starla Link aware and in agreement. PT, TOC and NSG aware.   Gayland Curry, [place], [place] Admissions Coordinator [phone]

## 2022-08-29 NOTE — Progress Notes (Signed)
Signed     PMR Admission Coordinator Pre-Admission Assessment   Patient: Maurice Cruz is an 56 y.o., male MRN: [id number] DOB: 10-11-1965 Height: _0  (177.8 cm) Weight: 82.1 kg   Insurance Information HMO:     PPO: yes     PCP:      IPA:      80/20:      OTHER:  PRIMARY: [employer] [place]      Policy#: [id number]      Subscriber: Pt CM Name: TBD      Phone#: [phone]     Fax#: [phone]      Pre-Cert#: [id number]   Received insurance authorization on 08/28/22. Pt approved for 08/29/22-09/11/22. Updates due on 09/11/22.    Employer:  Benefits:  Phone #:      Name:  Irene Shipper Date: 09/30/2021 - still active Deductible: $500 ($500 met) OOP Max: $1,500 ($1,500 met) CIR: 90% coverage, 10% co-insurance SNF: 90% coverage, 10% co-insurance; with a limit of 100 visits/cal year Outpatient:  90% coverage, 10% co-insurance; combined 60 visit limit combined/cal yr Home Health: 90% coverage, 10% co-insurance; with a limit of 100 visits/cal year DME: 90% coverage, 10% co-insurance Providers: in network  SECONDARY:       Policy#:      Phone#:  Development worker, community:       Phone#:    The Engineer, petroleum" for patients in Inpatient Rehabilitation Facilities with attached "Privacy Act [place] Records" was provided and verbally reviewed with: n/a   Emergency Contact Information Contact Information       Name Relation Home Work [place] Mother [phone]               Current Medical History  Patient Admitting Diagnosis: AKI, Debility, chronic spinal cord hemorrhage History of Present Illness: Maurice Cruz is a 56 y.o. male with medical history significant of transverse myelitis and followed by Dr. [name] with neurology as outpatient since 2021 who presented with increased weakness to [facility] ED 08/25/22.  He has completed extensive rehab with documented deficits including bilateral lower extremity weakness, neurogenic bladder does  self-catheterization, neurogenic bowel, and use of walking cane or walker as needed.  Also has history of anxiety, depression, GERD, hypertension.In the ED, labs showing hemoglobin 11.5, MCV 85.0, BUN 32, creatinine 2.7, ESR 98, CRP 9.5.  MRI revealed chronic emergency T8-10.  Neurosurgery recommended no intervention.  Neurology thought that presentation was because of metabolic stressors. Pt. Admitted for lower extremity weakness and AKI vs CKD stage IV. Pt. Seen by PT/OT and they recommend CIR to assist return to PLOF.    Patient's medical record from [facility] has been reviewed by the rehabilitation admission coordinator and physician.   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 No  Family History   family history includes Cancer in his maternal grandmother.   Current Medications   Current Facility-Administered Medications:    0.9 %  sodium chloride infusion, , Intravenous, Continuous, Alekh, Kshitiz, MD, Last Rate: 75 mL/hr at 08/28/22 1044, Rate Change at 08/28/22 1044   acetaminophen (TYLENOL) tablet 650 mg, 650 mg, Oral, Q6H PRN, 650 mg at 08/27/22 2235 **OR** acetaminophen (TYLENOL) suppository 650 mg, 650 mg, Rectal, Q6H PRN, Shela Leff, MD   amLODipine (NORVASC) tablet 10 mg, 10 mg, Oral, Daily, Shela Leff, MD, 10 mg at 08/28/22 0831   azithromycin (ZITHROMAX) tablet 500 mg, 500 mg, Oral, Daily, Alekh, Kshitiz, MD, 500 mg at 08/28/22 1047   carvedilol (COREG) tablet 25 mg, 25 mg, Oral, BID WC,  Shela Leff, MD, 25 mg at 08/28/22 0831   cefTRIAXone (ROCEPHIN) 1 g in sodium chloride 0.9 % 100 mL IVPB, 1 g, Intravenous, Q24H, Alekh, Kshitiz, MD, Last Rate: 200 mL/hr at 08/28/22 1046, 1 g at 08/28/22 1046   colchicine tablet 0.6 mg, 0.6 mg, Oral, Daily, Alekh, Kshitiz, MD, 0.6 mg at 08/28/22 1047   diazepam (VALIUM) tablet 2 mg, 2 mg, Oral, QHS, Shela Leff, MD, 2 mg at 08/27/22 2235   influenza vac split quadrivalent PF (FLUARIX) injection 0.5 mL, 0.5 mL, Intramuscular, Prior to discharge, Dessa Phi, DO   methylPREDNISolone sodium succinate (SOLU-MEDROL) 125 mg/2 mL injection 40 mg, 40 mg, Intravenous, Daily, Alekh, Kshitiz, MD, 40 mg at 08/28/22 1044   pantoprazole (PROTONIX) EC tablet 40 mg, 40 mg, Oral, Daily, Shela Leff, MD, 40 mg at 08/28/22 0831   polyethylene glycol (MIRALAX / GLYCOLAX) packet 17 g, 17 g, Oral, Daily PRN, Shela Leff, MD   rosuvastatin (CRESTOR) tablet 5 mg, 5 mg, Oral, Daily, Shela Leff, MD, 5 mg at 08/28/22 0831   tiZANidine (ZANAFLEX) tablet 2 mg, 2 mg, Oral, QHS, Shela Leff, MD, 2 mg at 08/27/22 2235   Patients Current Diet:  Diet Order                  Diet Heart Room service appropriate? Yes; Fluid consistency: Thin  Diet effective now                         Precautions / Restrictions Precautions Precautions: Fall Restrictions Weight Bearing Restrictions: No    Has the patient had 2 or more falls or a fall with injury in the past year? No   Prior Activity Level Limited Community (1-2x/wk): Pt. went out mostly for appointments   Prior Functional Level Self Care: Did the patient need help bathing, dressing, using the toilet or eating? Independent   Indoor Mobility: Did the patient need assistance with walking from room to room (with or without device)? Independent   Stairs: Did the patient need assistance with internal or external stairs (with or without device)? Dependent (has chair lift)    Functional Cognition: Did the patient need help planning regular tasks such as shopping or remembering to take medications? Independent   Patient Information Are you of Hispanic, Latino/a,or Spanish origin?: A. No, not of Hispanic, Latino/a, or Spanish origin What is your race?: B. Black or African American Do you need or want an interpreter to communicate with a doctor or health care staff?: 0. No   Patient's Response To:  Health Literacy and Transportation Is the patient able to respond to health literacy and transportation needs?: Yes Health Literacy - How often do you need to have someone  help you when you read instructions, pamphlets, or other written material from your doctor or pharmacy?: Never In the past 12 months, has lack of transportation kept you from medical appointments or from getting medications?: No In the past 12 months, has lack of transportation kept you from meetings, work, or from getting things needed for daily living?: No   Development worker, international aid / [place] Devices/Equipment: Wheelchair, Environmental consultant (specify type), Eyeglasses Home Equipment: Conservation officer, nature (2 wheels), Wheelchair - manual, Tub bench   Prior Device Use: Indicate devices/aids used by the patient prior to current illness, exacerbation or injury? Manual wheelchair and Walker   Current Functional Level Cognition   Overall Cognitive Status: Within Functional Limits for tasks assessed Orientation Level: Oriented X4 General Comments: Pt tearful over progression of his weakness and no known cause    Extremity Assessment (includes Sensation/Coordination)   Upper Extremity Assessment: Overall WFL for tasks assessed  Lower Extremity Assessment: Defer to PT evaluation RLE Deficits / Details: Weaker at his baseline compared to L.  Now: ROM WFL, MMT: ankle DF 5/5, knee ext 4/5, hip flexion 1/5 (reports that is fairly normal for that hip) LLE Deficits / Details: Typically his stronger leg; Now :  ROM WFL; MMT: ankle DF 5/5, knee ext 4/5, hip flexion 3/5.  Noted very mild edema and erythema medial knee.  Pt reports mildly tender over medial tibia plateau when palpated     ADLs   Overall ADL's : Needs assistance/impaired Eating/Feeding: Set up, Sitting Grooming: Set up, Sitting Upper Body Bathing: Set up, Sitting, Supervision/ safety Lower Body Bathing: Moderate assistance, Sitting/lateral leans, Sit to/from stand Upper Body Dressing : Set up, Supervision/safety, Sitting Lower Body Dressing: Moderate assistance, Sitting/lateral leans, Sit to/from stand Toilet Transfer: Moderate assistance, Stand-pivot, Rolling walker (2 wheels) Toilet Transfer Details (indicate cue type and reason): simulated with EOB to recliner Toileting- Clothing Manipulation and Hygiene: Moderate assistance, Sitting/lateral lean, Sit to/from stand Tub/ Shower Transfer: Moderate assistance, Stand-pivot General ADL Comments: Pt completed bed mobility and took pivotal steps to access recliner. Mod assist for transfers     Mobility   Overal bed mobility: Needs Assistance Bed Mobility: Supine to Sit Supine to sit: Min assist Sit to supine: Min assist General bed mobility comments: Pt using UEs to advance RLE across bed.  Extra time and effort, min A to steady.     Transfers   Overall transfer level: Needs assistance Equipment used: Rolling walker (2 wheels) Transfers: Sit to/from Stand, Bed to chair/wheelchair/BSC Sit to Stand: Mod assist Bed to/from chair/wheelchair/BSC transfer type:: Stand pivot Stand pivot transfers: Mod assist General transfer comment: Successful on second attempt. Bed elevated, pt with heavy reliance on BUE.  Mod A to powerup and steady and to control descent in to recliner. min A to steady while taking pivotal steps to recliner     Ambulation / Gait / Stairs / Emergency planning/management officer   Ambulation/Gait Ambulation/Gait assistance: Herbalist (Feet): 4 Feet Assistive device:  Rolling walker (2 wheels) Gait Pattern/deviations: Step-to pattern, Decreased stride length, Decreased dorsiflexion - left, Decreased dorsiflexion - right, Shuffle General Gait Details: Pt reporting L knee felt like it would buckle; heavy reliance on bil UE; pt sliding feet forward; limited distance due to weakness and spasms Gait velocity: decreased     Posture / Balance Balance Overall balance assessment: Needs assistance Sitting-balance support: No upper extremity supported Sitting balance-Leahy Scale: Good Standing balance support: Bilateral upper extremity supported, Reliant on assistive device for balance Standing  balance-Leahy Scale: Poor Standing balance comment: RW and min A     Special needs/care consideration Special service needs none    Previous Home Environment (from acute therapy documentation) Living Arrangements: Parent Available Help at Discharge: Family, Available 24 hours/day Type of Home: House Home Layout: Multi-level Alternate Level Stairs-Number of Steps: has stair lifts on all steps Home Access: Stairs to enter Entrance Stairs-Number of Steps: 1 Bathroom Shower/Tub: Optometrist:  (can turn RW sideways to get in) Home Care Services: No Additional Comments: stair lifts, doesn't have to use tub bench   Discharge Living Setting Plans for Discharge Living Setting: Patient's home Type of Home at Discharge: House Discharge Home Layout: Multi-level Alternate Level Stairs-Number of Steps: has chair lift for stairs to 2nd and 3rd floors Discharge Home Access: Stairs to enter Entrance Stairs-Rails: None Entrance Stairs-Number of Steps: 1 Discharge Bathroom Shower/Tub: Tub/shower unit Discharge Bathroom Toilet: Standard Discharge Bathroom Accessibility: Yes Does the patient have any problems obtaining your medications?: No   Social/Family/Support Systems Patient Roles: Parent Contact Information:  [phone] Anticipated Caregiver: Garald Braver Anticipated Caregiver's Contact Information: [phone] Ability/Limitations of Caregiver: Supervision to light min A Caregiver Availability: 24/7 Discharge Plan Discussed with Primary Caregiver: Yes Is Caregiver In Agreement with Plan?: No Does Caregiver/Family have Issues with Lodging/Transportation while Pt is in Rehab?: No   Goals Patient/Family Goal for Rehab: PT/OT Supervision Expected length of stay: 12-14 days Pt/Family Agrees to Admission and willing to participate: Yes Program Orientation Provided & Reviewed with Pt/Caregiver Including Roles  & Responsibilities: Yes   Decrease burden of Care through IP rehab admission: n/a   Possible need for SNF placement upon discharge: none    Patient Condition: I have reviewed medical records from [facility] , spoken with CM, and patient. I met with patient at the bedside for inpatient rehabilitation assessment.  Patient will benefit from ongoing PT and OT, can actively participate in 3 hours of therapy a day 5 days of the week, and can make measurable gains during the admission.  Patient will also benefit from the coordinated team approach during an Inpatient Acute Rehabilitation admission.  The patient will receive intensive therapy as well as Rehabilitation physician, nursing, social worker, and care management interventions.  Due to safety, skin/wound care, disease management, medication administration, pain management, and patient education the patient requires 24 hour a day rehabilitation nursing.  The patient is currently Min-Mod A with mobility and Supervision-Mod A with basic ADLs.  Discharge setting and therapy post discharge at home with home health is anticipated.  Patient has agreed to participate in the Acute Inpatient Rehabilitation Program and will admit today.   Preadmission Screen Completed By:  Genella Mech, with updates by Gayland Curry 08/28/2022 12:39  PM ______________________________________________________________________   Discussed status with Dr. Naaman Plummer on 08/29/22  at 10:44 AM and received approval for admission today.   Admission Coordinator:  Genella Mech, CCC-SLP, with updates by Gayland Curry, MS, CCC-SLP time 10:44 AM/Date 08/29/22     Assessment/Plan: Diagnosis: debility due to multiple medical in paraplegic patient Does the need for close, 24 hr/day Medical supervision in concert with the patient's rehab needs make it unreasonable for this patient to be served in a less intensive setting? Yes Co-Morbidities requiring supervision/potential complications: neurogenic bowel and bladder, anxiety, htn  Due to bladder management, bowel management, safety, skin/wound care, disease management, medication administration, pain management, and patient education, does the patient require 24 hr/day rehab  nursing? Yes Does the patient require coordinated care of a physician, rehab nurse, PT, OT to address physical and functional deficits in the context of the above medical diagnosis(es)? Yes Addressing deficits in the following areas: balance, endurance, locomotion, strength, transferring, bowel/bladder control, bathing, dressing, feeding, grooming, toileting, and psychosocial support Can the patient actively participate in an intensive therapy program of at least 3 hrs of therapy 5 days a week? Yes The potential for patient to make measurable gains while on inpatient rehab is excellent Anticipated functional outcomes upon discharge from inpatient rehab: supervision PT, supervision OT, n/a SLP Estimated rehab length of stay to reach the above functional goals is: 12-14 days Anticipated discharge destination: Home 10. Overall Rehab/Functional Prognosis: excellent     MD Signature: Meredith Staggers, MD, [place] Director Rehabilitation Services 08/29/2022

## 2022-08-29 NOTE — Progress Notes (Signed)
Patient arrived on unit, oriented to unit. Reviewed medications, therapy schedule, rehab routine and plan of care. States an understanding of information reviewed. No complications noted at this time. Patient reports no pain and is AX4 Denson Niccoli L Belal Scallon  

## 2022-08-29 NOTE — Progress Notes (Signed)
Physical Therapy Treatment Patient Details Name: Maurice Cruz MRN: [id number] DOB: 1965-10-16 Today's Date: 08/29/2022   History of Present Illness Pt is 56 yo male admitted 08/25/22 with worsening LE weakness.  Pt with hx of transverse myelitis vs spinal cord hemorrhage (conflicting reports in notes) in 2021 with chronic LE weakness, neurogenic bladder and bowel. RJJ:OACZYSA hemorrhage T8-T10, non-operative. No clear explanation for progression of weakness. Pt with AKI.  PMHx: anxiety, depression, GERD, and HTN.    PT Comments    Pt very positive with progression in activity able to walk to door. Pt fatigued after turning at door and required WC pulled to him. Pt educated for HEP and progression. Pt with continued LLE weakness and fatigue inhibiting his function. AIR remains appropriate and will continue to follow.    Recommendations for follow up therapy are one component of a multi-disciplinary discharge planning process, led by the attending physician.  Recommendations may be updated based on patient status, additional functional criteria and insurance authorization.  Follow Up Recommendations  Acute inpatient rehab (3hours/day)     Assistance Recommended at Discharge Frequent or constant Supervision/Assistance  Patient can return home with the following A lot of help with walking and/or transfers;Assistance with cooking/housework;Help with stairs or ramp for entrance;A little help with bathing/dressing/bathroom   Equipment Recommendations  None recommended by PT    Recommendations for Other Services       Precautions / Restrictions Precautions Precautions: Fall     Mobility  Bed Mobility               General bed mobility comments: in chair on arrival and end of session    Transfers Overall transfer level: Needs assistance   Transfers: Sit to/from Stand Sit to Stand: Min guard     Squat pivot transfers: Modified independent (Device/Increase time)      General transfer comment: guarding for safety with pt able to stand to/from recliner and WC. Squat pivot WC to recliner with pt performing setup and transfer without assist    Ambulation/Gait Ambulation/Gait assistance: Min assist Gait Distance (Feet): 30 Feet Assistive device: Rolling walker (2 wheels) Gait Pattern/deviations: Step-through pattern, Decreased stride length, Decreased stance time - right, Narrow base of support   Gait velocity interpretation: <1.8 ft/sec, indicate of risk for recurrent falls   General Gait Details: circumduction RLE due to lack of hip and knee flexion. Reliance on bil UE support   Stairs             Wheelchair Mobility    Modified Rankin (Stroke Patients Only)       Balance Overall balance assessment: Needs assistance   Sitting balance-Leahy Scale: Good     Standing balance support: Bilateral upper extremity supported, Reliant on assistive device for balance Standing balance-Leahy Scale: Poor Standing balance comment: RW for standing                            Cognition Arousal/Alertness: Awake/alert Behavior During Therapy: WFL for tasks assessed/performed Overall Cognitive Status: Within Functional Limits for tasks assessed                                          Exercises General Exercises - Lower Extremity Long Arc Quad: AAROM, Both, 15 reps, Seated Hip Flexion/Marching: AAROM, Left, Seated, 15 reps    General Comments  Pertinent Vitals/Pain Pain Assessment Pain Assessment: No/denies pain    Home Living                          Prior Function            PT Goals (current goals can now be found in the care plan section) Progress towards PT goals: Progressing toward goals    Frequency    Min 3X/week      PT Plan Current plan remains appropriate    Co-evaluation              AM-PAC PT "6 Clicks" Mobility   Outcome Measure  Help needed turning from  your back to your side while in a flat bed without using bedrails?: A Little Help needed moving from lying on your back to sitting on the side of a flat bed without using bedrails?: A Little Help needed moving to and from a bed to a chair (including a wheelchair)?: A Little Help needed standing up from a chair using your arms (e.g., wheelchair or bedside chair)?: A Little Help needed to walk in hospital room?: A Lot Help needed climbing 3-5 steps with a railing? : Total 6 Click Score: 15    End of Session Equipment Utilized During Treatment: Gait belt Activity Tolerance: Patient tolerated treatment well Patient left: in chair;with call bell/phone within reach Nurse Communication: Mobility status PT Visit Diagnosis: Other abnormalities of gait and mobility (R26.89);Muscle weakness (generalized) (M62.81)     Time: 8416-6063 PT Time Calculation (min) (ACUTE ONLY): 28 min  Charges:  $Gait Training: 8-22 mins $Therapeutic Activity: 8-22 mins                     Bayard Males, PT Acute Rehabilitation Services Office: [place] 08/29/2022, 10:02 AM

## 2022-08-29 NOTE — H&P (Signed)
Physical Medicine and Rehabilitation Admission H&P    CC: Functional deficits secondary to intramedullary spinal cord hematoma, chronic, with BLE weakness exacerbation due to metabolic stressor  HPI: Maurice Cruz is a 56 year old male with a history of spinal cord hematoma in 2022. Initially diagnosed while living in [place] in 2020 with BLE weakness. 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 He presented to [facility] ED on 08/25/2022 complaining of several history of BLE weakness, left greater than right. NS and neurology consulted. MRI with chronic changes. Neurology confirmed no TM diagnosis  and MRI of complete spine negative for cord compression and stable cord hemorrhage as compared to July 2022. The patient is followed as outpatient with Dr. [name]. AKI atop CKD noted. Lab work-up, LE venous duplex performed. Complained of right ankle pain on 11/27 and started on colchicine for acute gout flare. Also complained of lingering cough and had mild fever to 100.6. Chest x-ray and blood cultures performed. Possible LLL pneumonia and started on Rocephin and Zithromax on 11/29. Switching to Ceftin for 3 more days today, 11/30. Tolerating HH diet. 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  Has history of neurogenic bowel and bladder and self-caths. History of hyperlipidemia, CKD, hypertension, GERD.  Follows with Drs. Sater and Lovorn as outpatient.  Review of Systems  Constitutional:  Negative for chills and fever.  HENT:  Negative for ear pain.   Eyes:  Negative for blurred vision and double vision.  Respiratory:  Negative for cough.   Cardiovascular:  Negative for chest pain and palpitations.  Gastrointestinal:  Negative for heartburn, nausea and vomiting.  Genitourinary:  Negative for dysuria.   Musculoskeletal:  Positive for joint pain. Negative for myalgias.  Skin:  Negative for itching.  Neurological:  Positive for focal weakness and weakness. Negative for dizziness.  Psychiatric/Behavioral:  Negative for depression.    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[place])    Past Surgical History:  Procedure Laterality Date   Anorectal biospy  02/09/2021   Colon Biospy  02/09/2021   IR ANGIO INTRA EXTRACRAN SEL COM CAROTID INNOMINATE BILAT MOD SED  04/25/2021   IR ANGIO VERTEBRAL SEL SUBCLAVIAN INNOMINATE UNI L MOD SED  04/25/2021   IR ANGIO VERTEBRAL SEL VERTEBRAL UNI R MOD SED  04/25/2021   IR ANGIO/SPINAL LEFT  04/25/2021   IR ANGIO/SPINAL LEFT  04/25/2021   IR ANGIO/SPINAL LEFT  04/25/2021   IR ANGIO/SPINAL LEFT  04/25/2021   IR ANGIO/SPINAL LEFT  04/25/2021   IR ANGIO/SPINAL LEFT  04/25/2021   IR ANGIO/SPINAL LEFT  04/25/2021   IR ANGIO/SPINAL LEFT  04/25/2021   IR ANGIO/SPINAL LEFT  04/25/2021   IR ANGIO/SPINAL LEFT  04/25/2021   IR ANGIO/SPINAL LEFT  04/25/2021   IR ANGIO/SPINAL LEFT  04/25/2021   IR ANGIO/SPINAL LEFT  04/25/2021   IR ANGIO/SPINAL RIGHT  04/25/2021   IR ANGIO/SPINAL RIGHT  04/25/2021  IR ANGIO/SPINAL RIGHT  04/25/2021   IR ANGIO/SPINAL RIGHT  04/25/2021   IR ANGIO/SPINAL RIGHT  04/25/2021   IR ANGIO/SPINAL RIGHT  04/25/2021   IR ANGIO/SPINAL RIGHT  04/25/2021   IR ANGIO/SPINAL RIGHT  04/25/2021   IR ANGIO/SPINAL RIGHT  04/25/2021   IR ANGIO/SPINAL RIGHT  04/25/2021   IR ANGIO/SPINAL RIGHT  04/25/2021   IR ANGIO/SPINAL RIGHT  04/25/2021   IR ANGIO/SPINAL RIGHT  04/25/2021   IR ANGIO/SPINAL RIGHT  04/25/2021   IR ANGIOGRAM EXTREMITY  BILATERAL  04/25/2021   IR RADIOLOGIST EVAL & MGMT  03/21/2021   RADIOLOGY WITH ANESTHESIA N/A 04/25/2021   Procedure: IR WITH ANESTHESIA SPINAL ANGIOGRAM;  Surgeon: Luanne Bras, MD;  Location: [facility];  Service: Radiology;  Laterality: N/A;   Family History  Problem Relation Age of Onset   Cancer Maternal Grandmother    Social History:  reports that he has never smoked. He has never used smokeless tobacco. He reports that he does not currently use alcohol. He reports that he does not use drugs. Allergies: No Known Allergies Medications Prior to Admission  Medication Sig Dispense Refill   amLODipine (NORVASC) 10 MG tablet Take 10 mg by mouth daily.     CALCIUM POLYCARBOPHIL PO Mix 67ml into 8 oz of a beverage and drink daily     carvedilol (COREG) 25 MG tablet Take 25 mg by mouth 2 (two) times daily.     Magnesium 400 MG TABS Take 400 mg by mouth at bedtime.     Multiple Vitamins-Minerals (CENTRUM MEN PO) Take 1 tablet by mouth daily.     pantoprazole (PROTONIX) 40 MG tablet Take 40 mg by mouth daily.     polyethylene glycol (MIRALAX / GLYCOLAX) 17 g packet Take 17 g by mouth daily.     rosuvastatin (CRESTOR) 5 MG tablet Take 5 mg by mouth daily.     sildenafil (VIAGRA) 100 MG tablet Take 100 mg by mouth See admin instructions. Take 100 mg every 3 days as needed for erectile dysfunction     torsemide (DEMADEX) 10 MG tablet Take 10 mg by mouth daily.     traMADol (ULTRAM) 50 MG tablet Take 1 tablet (50 mg total) by mouth every 6 (six) hours as needed. (Patient taking differently: Take 50 mg by mouth daily as needed for moderate pain.) 75 tablet 5   valsartan (DIOVAN) 320 MG tablet Take 320 mg by mouth daily.     [DISCONTINUED] diazepam (VALIUM) 2 MG tablet TAKE 1 TABLET BY MOUTH IN THE MORNING AND AT BEDTIME (Patient taking differently: Take 2 mg by mouth at bedtime.) 60 tablet 5   [DISCONTINUED] tiZANidine (ZANAFLEX) 4 MG tablet TAKE 1/2 TO 1 (ONE-HALF TO ONE) TABLET BY MOUTH TWICE DAILY  (Patient taking differently: Take 2 mg by mouth at bedtime.) 60 tablet 5      Home: [place] expects to be discharged to:: Private residence Living Arrangements: Parent Available Help at Discharge: Family, Available 24 hours/day Type of Home: House Home Access: Stairs to enter Technical brewer of Steps: 1 Home Layout: Multi-level Alternate Level Stairs-Number of Steps: has stair lifts on all steps Bathroom Shower/Tub: Chiropodist: Standard Bathroom Accessibility:  (can turn RW sideways to get in) Home Equipment: Conservation officer, nature (2 wheels), Wheelchair - manual, Tub bench Additional Comments: stair lifts, doesn't have to use tub bench   Functional History: Prior Function Prior Level of Function : Independent/Modified Independent Mobility Comments: ambulates with RW for short community distances but  if longer will take w/c ADLs Comments: mod I (walker) for ADLs and light ADLs  Functional Status:  Mobility: Bed Mobility Overal bed mobility: Needs Assistance Bed Mobility: Supine to Sit Supine to sit: Min assist Sit to supine: Min assist General bed mobility comments: in chair on arrival and end of session Transfers Overall transfer level: Needs assistance Equipment used: Rolling walker (2 wheels) Transfers: Sit to/from Stand Sit to Stand: Min guard Bed to/from chair/wheelchair/BSC transfer type:: Squat pivot Stand pivot transfers: Mod assist Squat pivot transfers: Modified independent (Device/Increase time) General transfer comment: guarding for safety with pt able to stand to/from recliner and WC. Squat pivot WC to recliner with pt performing setup and transfer without assist Ambulation/Gait Ambulation/Gait assistance: Min assist Gait Distance (Feet): 30 Feet Assistive device: Rolling walker (2 wheels) Gait Pattern/deviations: Step-through pattern, Decreased stride length, Decreased stance time - right, Narrow base of  support General Gait Details: circumduction RLE due to lack of hip and knee flexion. Reliance on bil UE support Gait velocity: decreased Gait velocity interpretation: <1.8 ft/sec, indicate of risk for recurrent falls    ADL: ADL Overall ADL's : Needs assistance/impaired Eating/Feeding: Set up, Sitting Grooming: Set up, Sitting Upper Body Bathing: Set up, Sitting, Supervision/ safety Lower Body Bathing: Moderate assistance, Sitting/lateral leans, Sit to/from stand Upper Body Dressing : Set up, Supervision/safety, Sitting Lower Body Dressing: Moderate assistance, Sitting/lateral leans, Sit to/from stand Toilet Transfer: Moderate assistance, Stand-pivot, Rolling walker (2 wheels) Toilet Transfer Details (indicate cue type and reason): simulated with EOB to recliner Toileting- Clothing Manipulation and Hygiene: Moderate assistance, Sitting/lateral lean, Sit to/from stand Tub/ Shower Transfer: Moderate assistance, Stand-pivot General ADL Comments: Pt completed bed mobility and took pivotal steps to access recliner. Mod assist for transfers  Cognition: Cognition Overall Cognitive Status: Within Functional Limits for tasks assessed Orientation Level: Oriented X4 Cognition Arousal/Alertness: Awake/alert Behavior During Therapy: WFL for tasks assessed/performed Overall Cognitive Status: Within Functional Limits for tasks assessed General Comments: Pt tearful over progression of his weakness and no known cause  Physical Exam: Blood pressure 135/77, pulse 73, temperature 98.3 F (36.8 C), temperature source Oral, resp. rate 16, height 5\' 10"  (1.778 m), weight 82.1 kg, SpO2 99 %. Physical Exam Constitutional:      Appearance: Normal appearance.  HENT:     Head: Normocephalic and atraumatic.     Right Ear: External ear normal.     Left Ear: External ear normal.     Nose: Nose normal.     Mouth/Throat:     Mouth: Mucous membranes are moist.     Pharynx: Oropharynx is clear.  Eyes:      Extraocular Movements: Extraocular movements intact.     Conjunctiva/sclera: Conjunctivae normal.     Pupils: Pupils are equal, round, and reactive to light.  Cardiovascular:     Rate and Rhythm: Normal rate and regular rhythm.     Heart sounds: No murmur heard.    No gallop.  Pulmonary:     Effort: Pulmonary effort is normal. No respiratory distress.     Breath sounds: No wheezing.  Abdominal:     General: Bowel sounds are normal. There is no distension.     Palpations: Abdomen is soft.     Tenderness: There is no abdominal tenderness.  Musculoskeletal:        General: No swelling or tenderness.     Cervical back: Normal range of motion.     Comments: Right ankle discomfort  Skin:    General: Skin is  warm and dry.  Neurological:     Mental Status: He is alert.     Comments: Alert and oriented x 3. Normal insight and awareness. Intact Memory. Normal language and speech. Cranial nerve exam unremarkable. UE Motor 5/5 . RLE 1 to 1+ HF, KE, KF and 2+ to 3-/5 ADF/P.  LLE 3-HF, 3/5 KE, KF and 3 to 3+ ADF/PF. Approximately T10 sensory level but is able to sense pain and gross touch below that level. DTR's 3+ in LE's. Minimal LE resting hypertonia  Psychiatric:        Mood and Affect: Mood normal.        Behavior: Behavior normal.     Results for orders placed or performed during the hospital encounter of 08/25/22 (from the past 48 hour(s))  C Difficile Quick Screen w PCR reflex     Status: None   Collection Time: 08/27/22 12:37 PM   Specimen: STOOL  Result Value Ref Range   C Diff antigen NEGATIVE NEGATIVE   C Diff toxin NEGATIVE NEGATIVE   C Diff interpretation No C. difficile detected.     Comment: Performed at [facility], [place] [address] [place], [place] [address]  Culture, blood (Routine X 2) w Reflex to ID Panel     Status: None (Preliminary result)   Collection Time: 08/27/22  3:49 PM   Specimen: BLOOD RIGHT HAND  Result Value Ref Range   Specimen Description BLOOD  RIGHT HAND    Special Requests      BOTTLES DRAWN AEROBIC AND ANAEROBIC Blood Culture adequate volume   Culture      NO GROWTH 2 DAYS Performed at [facility], [place] [address], [place], [place] [address]    Report Status PENDING   Culture, blood (Routine X 2) w Reflex to ID Panel     Status: None (Preliminary result)   Collection Time: 08/27/22  3:51 PM   Specimen: BLOOD LEFT ARM  Result Value Ref Range   Specimen Description BLOOD LEFT ARM    Special Requests      BOTTLES DRAWN AEROBIC AND ANAEROBIC Blood Culture adequate volume   Culture      NO GROWTH 2 DAYS Performed at [facility], [place] [address] [place], [place] [address]    Report Status PENDING   Basic metabolic panel     Status: Abnormal   Collection Time: 08/28/22  5:55 AM  Result Value Ref Range   Sodium 136 135 - 145 mmol/L   Potassium 4.3 3.5 - 5.1 mmol/L   Chloride 109 98 - 111 mmol/L   CO2 20 (L) 22 - 32 mmol/L   Glucose, Bld 97 70 - 99 mg/dL    Comment: Glucose reference range applies only to samples taken after fasting for at least 8 hours.   BUN 23 (H) 6 - 20 mg/dL   Creatinine, Ser 1.93 (H) 0.61 - 1.24 mg/dL   Calcium 8.4 (L) 8.9 - 10.3 mg/dL   GFR, Estimated 41 (L) >60 mL/min    Comment: (NOTE) Calculated using the CKD-EPI Creatinine Equation (2021)    Anion gap 7 5 - 15    Comment: Performed at [facility] [address], [place], [place] [address]  CBC with Differential/Platelet     Status: Abnormal   Collection Time: 08/29/22  7:14 AM  Result Value Ref Range   WBC 15.1 (H) 4.0 - 10.5 K/uL   RBC 3.48 (L) 4.22 - 5.81 MIL/uL   Hemoglobin  9.7 (L) 13.0 - 17.0 g/dL   HCT 28.9 (L) 39.0 - 52.0 %   MCV 83.0 80.0 - 100.0 fL   MCH 27.9 26.0 - 34.0 pg   MCHC 33.6 30.0 - 36.0 g/dL   RDW 13.1 11.5 - 15.5 %   Platelets 332 150 - 400 K/uL   nRBC 0.0 0.0 - 0.2 %   Neutrophils Relative % 86 %   Neutro Abs 13.0 (H) 1.7 - 7.7 K/uL   Lymphocytes Relative 8 %   Lymphs Abs 1.3 0.7 - 4.0 K/uL    Monocytes Relative 5 %   Monocytes Absolute 0.7 0.1 - 1.0 K/uL   Eosinophils Relative 0 %   Eosinophils Absolute 0.0 0.0 - 0.5 K/uL   Basophils Relative 0 %   Basophils Absolute 0.0 0.0 - 0.1 K/uL   Immature Granulocytes 1 %   Abs Immature Granulocytes 0.07 0.00 - 0.07 K/uL    Comment: Performed at [facility] [address], [place], [place] [address]  Basic metabolic panel     Status: Abnormal   Collection Time: 08/29/22  7:14 AM  Result Value Ref Range   Sodium 139 135 - 145 mmol/L   Potassium 4.3 3.5 - 5.1 mmol/L   Chloride 108 98 - 111 mmol/L   CO2 19 (L) 22 - 32 mmol/L   Glucose, Bld 124 (H) 70 - 99 mg/dL    Comment: Glucose reference range applies only to samples taken after fasting for at least 8 hours.   BUN 24 (H) 6 - 20 mg/dL   Creatinine, Ser 1.75 (H) 0.61 - 1.24 mg/dL   Calcium 9.3 8.9 - 10.3 mg/dL   GFR, Estimated 46 (L) >60 mL/min    Comment: (NOTE) Calculated using the CKD-EPI Creatinine Equation (2021)    Anion gap 12 5 - 15    Comment: Performed at [facility] [address] [place], [place] [address]  Magnesium     Status: None   Collection Time: 08/29/22  7:14 AM  Result Value Ref Range   Magnesium 2.2 1.7 - 2.4 mg/dL    Comment: Performed at [facility] [address] [place], [place] [address]   DG CHEST PORT 1 VIEW  Result Date: 08/27/2022 CLINICAL DATA:  Cough and fever EXAM: PORTABLE CHEST 1 VIEW COMPARISON:  None Available. FINDINGS: Negative for heart failure. Right lung is clear. Possible early airspace disease left lower lobe which could be atelectasis or infiltrate. No effusion IMPRESSION: Possible left lower lobe atelectasis or infiltrate. Electronically Signed   By: Franchot Gallo M.D.   On: 08/27/2022 16:53   DG Ankle Complete Right  Result Date: 08/27/2022 CLINICAL DATA:  Gout.  Ankle swelling EXAM: RIGHT ANKLE - COMPLETE 3+ VIEW COMPARISON:  None Available. FINDINGS: Normal alignment. No fracture. No erosion or  joint space narrowing. Small joint effusion. IMPRESSION: Small joint effusion. No fracture or joint space narrowing. Electronically Signed   By: Franchot Gallo M.D.   On: 08/27/2022 16:52      Blood pressure 135/77, pulse 73, temperature 98.3 F (36.8 C), temperature source Oral, resp. rate 16, height 5\' 10"  (1.778 m), weight 82.1 kg, SpO2 99 %.  Medical Problem List and Plan: 1. 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   -pt is clinically a T10 level. MRI demonstrates chronic hemorrhage from T8-10  -patient may shower  -ELOS/Goals: 12-14 days, supervision goals with PT, OT 2.  Antithrombotics: -DVT/anticoagulation:  Mechanical:  Antiembolism stockings, knee (TED hose) Bilateral lower extremities  -antiplatelet therapy: none 3. Pain Management: Tylenol as needed 4. Mood/Behavior/Sleep: LCSW to evaluate and provide emotional support  -antipsychotic agents: n/a 5. Neuropsych/cognition: This patient is capable of making decisions on his own behalf. 6. Skin/Wound Care: Routine skin care checks 7. Fluids/Electrolytes/Nutrition: Routine Is and Os and follow-up chemistries on admit  8. AKI on CKD: creatinine is improving/? at baseline  -follow-up BMP tomorrow  9. Gout: acute flare 11/29--appears to be in less pain today  -continue colchicine 0.6 mg daily  -transition to prednisone 40 mg for 7 days  -started on allopurinol today for prophylaxis  10. Hypertension  -continue Norvasc 10 mg daily  -continue Coreg 25 mg BID  -bp currently controlled 11. Spasticity: continue Valium 2 mg q HS, Zanaflex 2 mg q HS  -these seem to be working for him, minimal resting LE tone 12. GERD: continue Protonix  13. Hyperlipidemia: continue Crestor  14. LLL pneumonia: transition to Ceftin today for 3 days  -IS/FV  -OOB 15. Anemia/chronic disease/dilutional: follow-up CBC  16. Leukocytosis: on steroids and getting abx for pneumonia; currently  afebrile  -follow-up CBC 17. Neurogenic bladder:  -pt self-caths 3-4 x per day 18. Pt not on an established bowel program. He uses miralax daily and has a bm every 2-3 days. He says he's tried suppositories, dig stim, enemas, etc and they didn't work for him. He had an incontinent bm this morning  -continue miralax per home regimen     Barbie Banner, PA-C 08/29/2022

## 2022-08-29 NOTE — Progress Notes (Signed)
Occupational Therapy Treatment Patient Details Name: Maurice Cruz MRN: [id number] DOB: 02/13/1966 Today's Date: 08/29/2022   History of present illness Pt is 56 yo male admitted 08/25/22 with worsening LE weakness.  Pt with hx of transverse myelitis vs spinal cord hemorrhage (conflicting reports in notes) in 2021 with chronic LE weakness, neurogenic bladder and bowel. VVO:HYWVPXT hemorrhage T8-T10, non-operative. No clear explanation for progression of weakness. Pt with AKI.  PMHx: anxiety, depression, GERD, and HTN.   OT comments  Patient pleasant and eager to participate with OT. Patient was min assist to get to EOB and min guard to stand from EOB. Patient transferred to wheelchair with RW and performed all self care tasks seated at sink with setup and min assist for changing socks. Patient ambulated short distance to recliner from wheelchair. Patient making good progress with discharge recommendations continue to be appropriate.    Recommendations for follow up therapy are one component of a multi-disciplinary discharge planning process, led by the attending physician.  Recommendations may be updated based on patient status, additional functional criteria and insurance authorization.    Follow Up Recommendations  Acute inpatient rehab (3hours/day)     Assistance Recommended at Discharge Intermittent Supervision/Assistance  Patient can return home with the following  A little help with walking and/or transfers;A lot of help with bathing/dressing/bathroom;Assistance with cooking/housework   Equipment Recommendations  Other (comment) (defer to next venue)    Recommendations for Other Services      Precautions / Restrictions Precautions Precautions: Fall Restrictions Weight Bearing Restrictions: No       Mobility Bed Mobility Overal bed mobility: Needs Assistance Bed Mobility: Supine to Sit     Supine to sit: Min assist     General bed mobility comments: min assist for LE  assist    Transfers Overall transfer level: Needs assistance Equipment used: Rolling walker (2 wheels) Transfers: Sit to/from Stand Sit to Stand: Min guard           General transfer comment: mn assist for safety with transfers to wheelchair and recliner     Balance Overall balance assessment: Needs assistance Sitting-balance support: No upper extremity supported Sitting balance-Leahy Scale: Good     Standing balance support: Bilateral upper extremity supported, Reliant on assistive device for balance Standing balance-Leahy Scale: Poor Standing balance comment: reliant on RW for support                           ADL either performed or assessed with clinical judgement   ADL Overall ADL's : Needs assistance/impaired     Grooming: Wash/dry hands;Wash/dry face;Oral care;Set up;Sitting Grooming Details (indicate cue type and reason): at sink Upper Body Bathing: Set up;Sitting Upper Body Bathing Details (indicate cue type and reason): at sink Lower Body Bathing: Moderate assistance;Sitting/lateral leans;Sit to/from stand Lower Body Bathing Details (indicate cue type and reason): min assist to bathe feet Upper Body Dressing : Set up;Sitting Upper Body Dressing Details (indicate cue type and reason): changed T-Shirt Lower Body Dressing: Minimal assistance;Sitting/lateral leans Lower Body Dressing Details (indicate cue type and reason): to change socks             Functional mobility during ADLs: Minimal assistance;Rolling walker (2 wheels) General ADL Comments: transferred to wheelchiar and performed self care tasks seated at sink    Extremity/Trunk Assessment              Vision       Perception  Praxis      Cognition Arousal/Alertness: Awake/alert Behavior During Therapy: WFL for tasks assessed/performed Overall Cognitive Status: Within Functional Limits for tasks assessed                                           Exercises      Shoulder Instructions       General Comments      Pertinent Vitals/ Pain       Pain Assessment Pain Assessment: No/denies pain Pain Intervention(s): Monitored during session  Home Living                                          Prior Functioning/Environment              Frequency  Min 2X/week        Progress Toward Goals  OT Goals(current goals can now be found in the care plan section)  Progress towards OT goals: Progressing toward goals  Acute Rehab OT Goals Patient Stated Goal: get better OT Goal Formulation: With patient Time For Goal Achievement: 09/09/22 Potential to Achieve Goals: Good ADL Goals Pt Will Perform Lower Body Bathing: with min guard assist;sitting/lateral leans;sit to/from stand Pt Will Perform Lower Body Dressing: with min guard assist;sit to/from stand;sitting/lateral leans Pt Will Transfer to Toilet: with min guard assist;stand pivot transfer;bedside commode Pt Will Perform Toileting - Clothing Manipulation and hygiene: with min guard assist;sitting/lateral leans;sit to/from stand  Plan Discharge plan remains appropriate    Co-evaluation                 AM-PAC OT "6 Clicks" Daily Activity     Outcome Measure   Help from another person eating meals?: None Help from another person taking care of personal grooming?: A Little Help from another person toileting, which includes using toliet, bedpan, or urinal?: A Lot Help from another person bathing (including washing, rinsing, drying)?: A Lot Help from another person to put on and taking off regular upper body clothing?: A Little Help from another person to put on and taking off regular lower body clothing?: A Lot 6 Click Score: 16    End of Session Equipment Utilized During Treatment: Gait belt;Rolling walker (2 wheels)  OT Visit Diagnosis: Unsteadiness on feet (R26.81);Other abnormalities of gait and mobility (R26.89);Muscle weakness  (generalized) (M62.81);Pain   Activity Tolerance Patient tolerated treatment well   Patient Left in chair;with call bell/phone within reach;Other (comment) (nursing notified that patient had alarm pad for chair but no alarm)   Nurse Communication Mobility status        Time: [phone] OT Time Calculation (min): 30 min  Charges: OT General Charges $OT Visit: 1 Visit OT Treatments $Self Care/Home Management : 23-37 mins  Lodema Hong, [place]  Office [place] 08/29/2022, 11:05 AM

## 2022-08-29 NOTE — Discharge Summary (Signed)
Physician Discharge Summary  Maurice Cruz NAT:[id number] DOB: 26-Feb-1966 DOA: 08/25/2022  PCP: Glendon Axe, MD  Admit date: 08/25/2022 Discharge date: 08/29/2022  Admitted From: Home Disposition: CIR  Recommendations for Outpatient Follow-up:  Follow up with CIR provider at earliest [place]: No Equipment/Devices: None  Discharge Condition: Stable CODE STATUS: Full Diet recommendation: Heart healthy  Brief/Interim Summary:  56 y.o. male with medical history significant of spinal cord hemorrhage and followed by Dr. [name] with neurology as outpatient since 2021.  He has completed extensive rehab with documented deficits including bilateral lower extremity weakness, neurogenic bladder does self-catheterization, neurogenic bowel, and use of walking cane or walker as needed.  Also has history of anxiety, depression, GERD, hypertension. He presented to the ED complaining of worsening left leg weakness x 2 days.   MRI revealed chronic emergency T8-10.  Neurosurgery recommended no intervention.  Neurology thought that presentation was because of metabolic stressors.  PT recommended CIR placement.  He was started on Rocephin and Zithromax for possible pneumonia along with colchicine, Solu-Medrol for possible acute gout flare.  He has subsequently remained stable.  He will be discharged to CIR once bed is available.  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 -Creatinine improving to  1.75 today.  Currently on IV fluids.  Will need close outpatient follow-up of BMP   Possible left lower lobe pneumonia -Presenting with fever, cough and chest x-ray showing possible left lower lobe pneumonia -Currently on Rocephin and Zithromax.  Currently on room air -Blood cultures negative so far.  COVID-19 and influenza testing negative. -Switch to Ceftin for 3 more days on discharge today.   Possible right ankle acute gout -Patient thinks that he is having acute gout flare.  -Treated with colchicine and IV Solu-Medrol.  Pain is much improved.  Continue colchicine for now.  Discharge on prednisone 40 mg daily for 7 days.  Add allopurinol for prophylaxis.  Outpatient follow-up.   Diarrhea -stool for C. difficile negative.   Hypertension -Continue Coreg and amlodipine.  Will keep torsemide and valsartan on hold.  Improved.   Hyperlipidemia -Continue statin   Discharge Instructions  Discharge Instructions     Diet - low sodium heart healthy   Complete by: As directed    Increase activity slowly   Complete by: As directed       Allergies as of 08/29/2022   No Known Allergies      Medication List     STOP taking these medications    torsemide 10 MG tablet Commonly known as: DEMADEX   valsartan 320 MG tablet Commonly known as: DIOVAN       TAKE these medications    allopurinol 100 MG tablet Commonly known as: Zyloprim Take 1 tablet (100 mg total) by mouth daily.   amLODipine 10 MG tablet Commonly known as: NORVASC Take 10 mg by mouth daily.   CALCIUM POLYCARBOPHIL PO Mix 19ml into 8 oz of a beverage and drink daily   carvedilol 25 MG tablet Commonly known as: COREG Take 25 mg by mouth 2 (two)  times daily.   cefUROXime 500 MG tablet Commonly known as: CEFTIN Take 1 tablet (500 mg total) by mouth 2 (two) times daily for 3 days. Start taking on: August 30, 2022   CENTRUM MEN PO Take 1 tablet by mouth daily.   colchicine 0.6 MG tablet Take 1  tablet (0.6 mg total) by mouth daily. Start taking on: August 30, 2022   diazepam 2 MG tablet Commonly known as: VALIUM Take 1 tablet (2 mg total) by mouth at bedtime. What changed: See the new instructions.   Magnesium 400 MG Tabs Take 400 mg by mouth at bedtime.   pantoprazole 40 MG tablet Commonly known as: PROTONIX Take 40 mg by mouth daily.   polyethylene glycol 17 g packet Commonly known as: MIRALAX / GLYCOLAX Take 17 g by mouth daily.   predniSONE 20 MG tablet Commonly known as: DELTASONE Take 2 tablets (40 mg total) by mouth daily with breakfast for 7 days.   rosuvastatin 5 MG tablet Commonly known as: CRESTOR Take 5 mg by mouth daily.   sildenafil 100 MG tablet Commonly known as: VIAGRA Take 100 mg by mouth See admin instructions. Take 100 mg every 3 days as needed for erectile dysfunction   tiZANidine 4 MG tablet Commonly known as: ZANAFLEX Take 0.5 tablets (2 mg total) by mouth at bedtime. What changed: See the new instructions.   traMADol 50 MG tablet Commonly known as: ULTRAM Take 1 tablet (50 mg total) by mouth every 6 (six) hours as needed. What changed:  when to take this reasons to take this        No Known Allergies  Consultations: Neurology/neurosurgery    Procedures/Studies: DG CHEST PORT 1 VIEW  Result Date: 08/27/2022 CLINICAL DATA:  Cough and fever EXAM: PORTABLE CHEST 1 VIEW COMPARISON:  None Available. FINDINGS: Negative for heart failure. Right lung is clear. Possible early airspace disease left lower lobe which could be atelectasis or infiltrate. No effusion IMPRESSION: Possible left lower lobe atelectasis or infiltrate. Electronically Signed   By: Franchot Gallo M.D.   On: 08/27/2022 16:53   DG Ankle Complete Right  Result Date: 08/27/2022 CLINICAL DATA:  Gout.  Ankle swelling EXAM: RIGHT ANKLE - COMPLETE 3+ VIEW COMPARISON:  None Available. FINDINGS: Normal alignment. No fracture. No erosion or joint space narrowing. Small  joint effusion. IMPRESSION: Small joint effusion. No fracture or joint space narrowing. Electronically Signed   By: Franchot Gallo M.D.   On: 08/27/2022 16:52   DG Knee 1-2 Views Left  Result Date: 08/26/2022 CLINICAL DATA:  Left leg weakness for 2 days. Had transverse myelitis and after rehab advanced from wheelchair to moving with walker. EXAM: LEFT KNEE - 1-2 VIEW COMPARISON:  None Available. FINDINGS: Normal bone mineralization. Joint spaces are preserved. No joint effusion. No acute fracture or dislocation. IMPRESSION: No significant abnormality. Electronically Signed   By: Yvonne Kendall M.D.   On: 08/26/2022 16:26   VAS [place] LOWER EXTREMITY VENOUS (DVT) (7a-7p)  Result Date: 08/26/2022  Lower Venous DVT Study Patient Name:  OLA FAWVER  Date of Exam:   08/26/2022 Medical Rec #: [id number]      Accession #:    [id number] Date of Birth: 10/06/1965     Patient Gender: M Patient Age:   53 years Exam Location:  [facility] Procedure:      VAS [place] LOWER EXTREMITY VENOUS (DVT) Referring Phys: Wandra Feinstein RATHORE --------------------------------------------------------------------------------  Indications: Pain.  Comparison Study: no prior Performing Technologist: Archie Patten RVS  Examination Guidelines: A complete evaluation includes B-mode imaging, spectral Doppler, color Doppler, and power Doppler as needed of all accessible portions of each vessel. Bilateral testing is considered an integral part of a complete examination. Limited examinations for reoccurring indications may be performed as noted. The reflux portion of the exam is performed with the patient in reverse Trendelenburg.  +---------+---------------+---------+-----------+----------+--------------+ RIGHT    CompressibilityPhasicitySpontaneityPropertiesThrombus Aging +---------+---------------+---------+-----------+----------+--------------+ CFV      Full           Yes      Yes                                  +---------+---------------+---------+-----------+----------+--------------+ SFJ      Full                                                        +---------+---------------+---------+-----------+----------+--------------+ FV Prox  Full                                                        +---------+---------------+---------+-----------+----------+--------------+ FV Mid   Full                                                        +---------+---------------+---------+-----------+----------+--------------+ FV DistalFull                                                        +---------+---------------+---------+-----------+----------+--------------+ PFV      Full                                                        +---------+---------------+---------+-----------+----------+--------------+ POP      Full           Yes      Yes                                 +---------+---------------+---------+-----------+----------+--------------+ PTV      Full                                                        +---------+---------------+---------+-----------+----------+--------------+ PERO     Full           Yes      Yes                                 +---------+---------------+---------+-----------+----------+--------------+   +----+---------------+---------+-----------+----------+--------------+  LEFTCompressibilityPhasicitySpontaneityPropertiesThrombus Aging +----+---------------+---------+-----------+----------+--------------+ CFV Full           Yes      Yes                                 +----+---------------+---------+-----------+----------+--------------+     Summary: RIGHT: - There is no evidence of deep vein thrombosis in the lower extremity.  - No cystic structure found in the popliteal fossa.  LEFT: - No evidence of common femoral vein obstruction.  *See table(s) above for measurements and observations. Electronically signed by Monica Martinez  MD on 08/26/2022 at 12:41:31 PM.    Final    US RENAL  Result Date: 08/26/2022 CLINICAL DATA:  Acute renal injury EXAM: RENAL / URINARY TRACT ULTRASOUND COMPLETE COMPARISON:  None Available. FINDINGS: Right Kidney: Renal measurements: 11.9 x 4.2 x 5.9 cm. = volume: 154 mL. Echogenicity within normal limits. No mass or hydronephrosis visualized. Left Kidney: Renal measurements: 13.7 x 6.0 x 5.6 cm. = volume: 240 mL. Echogenicity within normal limits. No mass or hydronephrosis visualized. Bladder: Appears normal for degree of bladder distention. Other: None. IMPRESSION: Normal appearing kidneys bilaterally. Electronically Signed   By: Inez Catalina M.D.   On: 08/26/2022 01:30   MR Cervical Spine W or Wo Contrast  Addendum Date: 08/25/2022   ADDENDUM REPORT: 08/25/2022 23:30 ADDENDUM: Additional comparison is made to MRI thoracic spine 03/14/2021. The T1 and T2 hypointense signal in the spinal cord posterior to T9 appears unchanged compared to 2022. These findings were discussed by telephone on 08/25/2022 at 11:19 pm with provider Khaliqdina. Electronically Signed   By: Merilyn Baba M.D.   On: 08/25/2022 23:30   Result Date: 08/25/2022 CLINICAL DATA:  Ataxia, cervical or thoracic pathology suspected EXAM: MRI CERVICAL AND THORACIC SPINE WITHOUT AND WITH CONTRAST TECHNIQUE: Multiplanar and multiecho pulse sequences of the cervical spine, to include the craniocervical junction and cervicothoracic junction, and the thoracic spine, were obtained without and with intravenous contrast. CONTRAST:  7.20mL GADAVIST GADOBUTROL 1 MMOL/ML IV SOLN COMPARISON:  MRI cervical and thoracic spine 04/15/2020 FINDINGS: MRI CERVICAL SPINE FINDINGS Alignment: Straightening of the normal cervical lordosis. Mild dextrocurvature. No listhesis. Vertebrae: No fracture, evidence of discitis, or bone lesion. Diffusely decreased marrow signal. No abnormal enhancement. Cord: Normal signal and morphology.  No abnormal enhancement.  Posterior Fossa, vertebral arteries, paraspinal tissues: Negative. Disc levels: Preserved disc hydration without significant disc bulge. No spinal canal stenosis or neural foraminal narrowing in the cervical spine. MRI THORACIC SPINE FINDINGS Alignment: S shaped curvature of the thoracolumbar spine. No significant listhesis. Vertebrae: No fracture, evidence of discitis, or bone lesion. No abnormal enhancement. Cord: The cord is normal in morphology. On susceptibility weighted imaging, there is hypointense signal in the anterior spinal cord at T8-T9 to T9-T10 (series 14, images 25-30). This is associated with hypointense signal on T1 and T2 weighted sequences, most likely chronic hemorrhage, which correlates with an area of enhancement on the prior MRI. No abnormal enhancement or increased T2 hyperintense signal on the current study. Paraspinal and other soft tissues: Small right-greater-than-left pleural effusions. Disc levels: No significant spinal canal stenosis or neural foraminal narrowing. IMPRESSION: 1. 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 No abnormal enhancement or increased T2 hyperintense signal on  the current study; abnormal signal in the adjacent cord on the prior exam could have reflected edema related to the hemorrhage. 2. Diffusely decreased marrow signal, which is nonspecific but can be seen in the setting of anemia, obesity or smoking. 3. Small right-greater-than-left pleural effusions. 4. No spinal canal stenosis or neural foraminal narrowing in the cervical or thoracic spine. Electronically Signed: By: Merilyn Baba M.D. On: 08/25/2022 21:23   MR THORACIC SPINE W WO CONTRAST  Addendum Date: 08/25/2022   ADDENDUM REPORT: 08/25/2022 23:30 ADDENDUM: Additional comparison is made to MRI thoracic  spine 03/14/2021. The T1 and T2 hypointense signal in the spinal cord posterior to T9 appears unchanged compared to 2022. These findings were discussed by telephone on 08/25/2022 at 11:19 pm with provider Khaliqdina. Electronically Signed   By: Merilyn Baba M.D.   On: 08/25/2022 23:30   Result Date: 08/25/2022 CLINICAL DATA:  Ataxia, cervical or thoracic pathology suspected EXAM: MRI CERVICAL AND THORACIC SPINE WITHOUT AND WITH CONTRAST TECHNIQUE: Multiplanar and multiecho pulse sequences of the cervical spine, to include the craniocervical junction and cervicothoracic junction, and the thoracic spine, were obtained without and with intravenous contrast. CONTRAST:  7.44mL GADAVIST GADOBUTROL 1 MMOL/ML IV SOLN COMPARISON:  MRI cervical and thoracic spine 04/15/2020 FINDINGS: MRI CERVICAL SPINE FINDINGS Alignment: Straightening of the normal cervical lordosis. Mild dextrocurvature. No listhesis. Vertebrae: No fracture, evidence of discitis, or bone lesion. Diffusely decreased marrow signal. No abnormal enhancement. Cord: Normal signal and morphology.  No abnormal enhancement. Posterior Fossa, vertebral arteries, paraspinal tissues: Negative. Disc levels: Preserved disc hydration without significant disc bulge. No spinal canal stenosis or neural foraminal narrowing in the cervical spine. MRI THORACIC SPINE FINDINGS Alignment: S shaped curvature of the thoracolumbar spine. No significant listhesis. Vertebrae: No fracture, evidence of discitis, or bone lesion. No abnormal enhancement. Cord: The cord is normal in morphology. On susceptibility weighted imaging, there is hypointense signal in the anterior spinal cord at T8-T9 to T9-T10 (series 14, images 25-30). This is associated with hypointense signal on T1 and T2 weighted sequences, most likely chronic hemorrhage, which correlates with an area of enhancement on the prior MRI. No abnormal enhancement or increased T2 hyperintense signal on the current study. Paraspinal  and other soft tissues: Small right-greater-than-left pleural effusions. Disc levels: No significant spinal canal stenosis or neural foraminal narrowing. IMPRESSION: 1. 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 No abnormal enhancement or increased T2 hyperintense signal on the current study; abnormal signal in the adjacent cord on the prior exam could have reflected edema related to the hemorrhage. 2. Diffusely decreased marrow signal, which is nonspecific but can be seen in the setting of anemia, obesity or smoking. 3. Small right-greater-than-left pleural effusions. 4. No spinal canal stenosis or neural foraminal narrowing in the cervical or thoracic spine. Electronically Signed: By: Merilyn Baba M.D. On: 08/25/2022 21:23   MR Lumbar Spine W Wo Contrast  Result Date: 08/25/2022 CLINICAL DATA:  Low back pain, cauda equina syndrome suspected EXAM: MRI LUMBAR SPINE WITHOUT AND WITH CONTRAST TECHNIQUE: Multiplanar and multiecho pulse sequences of the lumbar spine were obtained without and with intravenous contrast. CONTRAST:  7.72mL GADAVIST GADOBUTROL 1 MMOL/ML IV SOLN COMPARISON:  MRI lumbar spine 04/15/2020 FINDINGS: Segmentation: 5 lumbar type vertebral bodies. Partial sacralization of S1, with the last fully formed  disc space at S1-S2. Alignment:  Mild dextrocurvature.  No listhesis. Vertebrae: No fracture, evidence of discitis, or bone lesion. No abnormal enhancement. Conus medullaris and cauda equina: Conus extends to the L1-L2 level. Conus and cauda equina appear normal. No abnormal enhancement. Paraspinal and other soft tissues: Small renal cysts, for which no follow-up is indicated. Normal muscle bulk and signal. Disc levels: T12-L1: No significant disc bulge. No spinal canal stenosis or neural foraminal  narrowing. L1-L2: No significant disc bulge. No spinal canal stenosis or neural foraminal narrowing. L2-L3: No significant disc bulge. No spinal canal stenosis or neural foraminal narrowing. L3-L4: No significant disc bulge. No spinal canal stenosis or neural foraminal narrowing. L4-L5: No significant disc bulge. No spinal canal stenosis or neural foraminal narrowing. L5-S1: Minimal disc bulge. Mild facet arthropathy. No spinal canal stenosis or neural foraminal narrowing. IMPRESSION: No spinal canal stenosis or neural foraminal narrowing. No abnormal enhancement. No evidence of cauda equina syndrome. Electronically Signed   By: Merilyn Baba M.D.   On: 08/25/2022 20:29      Subjective: Patient seen and examined at bedside.  No chest pain, fever, worsening abdominal pain or vomiting reported.  Right ankle pain is improving.  Still intermittently coughing.  Discharge Exam: Vitals:   08/29/22 0404 08/29/22 0756  BP: 121/75 135/77  Pulse: 67 73  Resp: 16 16  Temp: 98.2 F (36.8 C) 98.3 F (36.8 C)  SpO2: 99% 99%    General: Pt is alert, awake, not in acute distress.  Currently on room air. Cardiovascular: rate controlled, S1/S2 + Respiratory: bilateral decreased breath sounds at bases with some scattered crackles Abdominal: Soft, NT, ND, bowel sounds + Extremities: Mild right ankle tenderness present; no cyanosis    The results of significant diagnostics from this hospitalization (including imaging, microbiology, ancillary and laboratory) are listed below for reference.     Microbiology: Recent Results (from the past 240 hour(s))  Resp Panel by RT-PCR (Flu A&B, Covid) Anterior Nasal Swab     Status: None   Collection Time: 08/27/22  7:39 AM   Specimen: Anterior Nasal Swab  Result Value Ref Range Status   SARS Coronavirus 2 by RT PCR NEGATIVE NEGATIVE Final    Comment: (NOTE) SARS-CoV-2 target nucleic acids are NOT DETECTED.  The SARS-CoV-2 RNA is generally detectable in upper  respiratory specimens during the acute phase of infection. The lowest concentration of SARS-CoV-2 viral copies this assay can detect is 138 copies/mL. A negative result does not preclude SARS-Cov-2 infection and should not be used as the sole basis for treatment or other patient management decisions. A negative result may occur with  improper specimen collection/handling, submission of specimen other than nasopharyngeal swab, presence of viral mutation(s) within the areas targeted by this assay, and inadequate number of viral copies(<138 copies/mL). A negative result must be combined with clinical observations, patient history, and epidemiological information. The expected result is Negative.  Fact Sheet for Patients:  EntrepreneurPulse.com.au  Fact Sheet for Healthcare Providers:  IncredibleEmployment.be  This test is no t yet approved or cleared by the [place] FDA and  has been authorized for detection and/or diagnosis of SARS-CoV-2 by FDA under an Emergency Use Authorization (EUA). This EUA will remain  in effect (meaning this test can be used) for the duration of the COVID-19 declaration under Section 564(b)(1) of the Act, 21 U.S.C.section 360bbb-3(b)(1), unless the authorization is terminated  or revoked sooner.       Influenza A by PCR NEGATIVE NEGATIVE Final  Influenza B by PCR NEGATIVE NEGATIVE Final    Comment: (NOTE) The Xpert Xpress SARS-CoV-2/FLU/RSV plus assay is intended as an aid in the diagnosis of influenza from Nasopharyngeal swab specimens and should not be used as a sole basis for treatment. Nasal washings and aspirates are unacceptable for Xpert Xpress SARS-CoV-2/FLU/RSV testing.  Fact Sheet for Patients: EntrepreneurPulse.com.au  Fact Sheet for Healthcare Providers: IncredibleEmployment.be  This test is not yet approved or cleared by the [place] FDA and has been  authorized for detection and/or diagnosis of SARS-CoV-2 by FDA under an Emergency Use Authorization (EUA). This EUA will remain in effect (meaning this test can be used) for the duration of the COVID-19 declaration under Section 564(b)(1) of the Act, 21 U.S.C. section 360bbb-3(b)(1), unless the authorization is terminated or revoked.  Performed at [facility], [place] [address], [place], [place] [address]   C Difficile Quick Screen w PCR reflex     Status: None   Collection Time: 08/27/22 12:37 PM   Specimen: STOOL  Result Value Ref Range Status   C Diff antigen NEGATIVE NEGATIVE Final   C Diff toxin NEGATIVE NEGATIVE Final   C Diff interpretation No C. difficile detected.  Final    Comment: Performed at [facility], [place] [address] [place], [place] [address]  Culture, blood (Routine X 2) w Reflex to ID Panel     Status: None (Preliminary result)   Collection Time: 08/27/22  3:49 PM   Specimen: BLOOD RIGHT HAND  Result Value Ref Range Status   Specimen Description BLOOD RIGHT HAND  Final   Special Requests   Final    BOTTLES DRAWN AEROBIC AND ANAEROBIC Blood Culture adequate volume   Culture   Final    NO GROWTH 2 DAYS Performed at [facility], [place] [address], [place], [place] [address]    Report Status PENDING  Incomplete  Culture, blood (Routine X 2) w Reflex to ID Panel     Status: None (Preliminary result)   Collection Time: 08/27/22  3:51 PM   Specimen: BLOOD LEFT ARM  Result Value Ref Range Status   Specimen Description BLOOD LEFT ARM  Final   Special Requests   Final    BOTTLES DRAWN AEROBIC AND ANAEROBIC Blood Culture adequate volume   Culture   Final    NO GROWTH 2 DAYS Performed at [facility], [place] [address] [place], [place] [address]    Report Status PENDING  Incomplete     Labs: BNP (last 3 results) Recent Labs    08/26/22 0509  BNP 93.8   Basic Metabolic Panel: Recent Labs  Lab 08/25/22 1625 08/26/22 0509 08/28/22 0555  08/29/22 0714  NA 136 139 136 139  K 5.1 4.8 4.3 4.3  CL 99 107 109 108  CO2 24 21* 20* 19*  GLUCOSE 103* 125* 97 124*  BUN 32* 33* 23* 24*  CREATININE 2.75* 2.63* 1.93* 1.75*  CALCIUM 9.4 8.6* 8.4* 9.3  MG  --   --   --  2.2   Liver Function Tests: No results for input(s): "AST", "ALT", "ALKPHOS", "BILITOT", "PROT", "ALBUMIN" in the last 168 hours. No results for input(s): "LIPASE", "AMYLASE" in the last 168 hours. No results for input(s): "AMMONIA" in the last 168 hours. CBC: Recent Labs  Lab 08/25/22 1625 08/29/22 0714  WBC 10.0 15.1*  NEUTROABS 6.4 13.0*  HGB 11.5* 9.7*  HCT 35.1* 28.9*  MCV 85.0 83.0  PLT 337 332  Cardiac Enzymes: No results for input(s): "CKTOTAL", "CKMB", "CKMBINDEX", "TROPONINI" in the last 168 hours. BNP: Invalid input(s): "POCBNP" CBG: No results for input(s): "GLUCAP" in the last 168 hours. D-Dimer No results for input(s): "DDIMER" in the last 72 hours. Hgb A1c No results for input(s): "HGBA1C" in the last 72 hours. Lipid Profile No results for input(s): "CHOL", "HDL", "LDLCALC", "TRIG", "CHOLHDL", "LDLDIRECT" in the last 72 hours. Thyroid function studies No results for input(s): "TSH", "T4TOTAL", "T3FREE", "THYROIDAB" in the last 72 hours.  Invalid input(s): "FREET3" Anemia work up No results for input(s): "VITAMINB12", "FOLATE", "FERRITIN", "TIBC", "IRON", "RETICCTPCT" in the last 72 hours. Urinalysis    Component Value Date/Time   COLORURINE YELLOW 08/25/2022 2118   APPEARANCEUR CLEAR 08/25/2022 2118   LABSPEC 1.008 08/25/2022 2118   PHURINE 5.0 08/25/2022 2118   GLUCOSEU NEGATIVE 08/25/2022 2118   HGBUR NEGATIVE 08/25/2022 2118   BILIRUBINUR NEGATIVE 08/25/2022 2118   KETONESUR NEGATIVE 08/25/2022 2118   PROTEINUR NEGATIVE 08/25/2022 2118   NITRITE NEGATIVE 08/25/2022 2118   LEUKOCYTESUR TRACE (A) 08/25/2022 2118   Sepsis Labs Recent Labs  Lab 08/25/22 1625 08/29/22 0714  WBC 10.0 15.1*   Microbiology Recent  Results (from the past 240 hour(s))  Resp Panel by RT-PCR (Flu A&B, Covid) Anterior Nasal Swab     Status: None   Collection Time: 08/27/22  7:39 AM   Specimen: Anterior Nasal Swab  Result Value Ref Range Status   SARS Coronavirus 2 by RT PCR NEGATIVE NEGATIVE Final    Comment: (NOTE) SARS-CoV-2 target nucleic acids are NOT DETECTED.  The SARS-CoV-2 RNA is generally detectable in upper respiratory specimens during the acute phase of infection. The lowest concentration of SARS-CoV-2 viral copies this assay can detect is 138 copies/mL. A negative result does not preclude SARS-Cov-2 infection and should not be used as the sole basis for treatment or other patient management decisions. A negative result may occur with  improper specimen collection/handling, submission of specimen other than nasopharyngeal swab, presence of viral mutation(s) within the areas targeted by this assay, and inadequate number of viral copies(<138 copies/mL). A negative result must be combined with clinical observations, patient history, and epidemiological information. The expected result is Negative.  Fact Sheet for Patients:  EntrepreneurPulse.com.au  Fact Sheet for Healthcare Providers:  IncredibleEmployment.be  This test is no t yet approved or cleared by the [place] FDA and  has been authorized for detection and/or diagnosis of SARS-CoV-2 by FDA under an Emergency Use Authorization (EUA). This EUA will remain  in effect (meaning this test can be used) for the duration of the COVID-19 declaration under Section 564(b)(1) of the Act, 21 U.S.C.section 360bbb-3(b)(1), unless the authorization is terminated  or revoked sooner.       Influenza A by PCR NEGATIVE NEGATIVE Final   Influenza B by PCR NEGATIVE NEGATIVE Final    Comment: (NOTE) The Xpert Xpress SARS-CoV-2/FLU/RSV plus assay is intended as an aid in the diagnosis of influenza from Nasopharyngeal swab  specimens and should not be used as a sole basis for treatment. Nasal washings and aspirates are unacceptable for Xpert Xpress SARS-CoV-2/FLU/RSV testing.  Fact Sheet for Patients: EntrepreneurPulse.com.au  Fact Sheet for Healthcare Providers: IncredibleEmployment.be  This test is not yet approved or cleared by the [place] FDA and has been authorized for detection and/or diagnosis of SARS-CoV-2 by FDA under an Emergency Use Authorization (EUA). This EUA will remain in effect (meaning this test can be used) for the duration of the COVID-19 declaration under  Section 564(b)(1) of the Act, 21 U.S.C. section 360bbb-3(b)(1), unless the authorization is terminated or revoked.  Performed at [facility], [place] [address], [place], [place] [address]   C Difficile Quick Screen w PCR reflex     Status: None   Collection Time: 08/27/22 12:37 PM   Specimen: STOOL  Result Value Ref Range Status   C Diff antigen NEGATIVE NEGATIVE Final   C Diff toxin NEGATIVE NEGATIVE Final   C Diff interpretation No C. difficile detected.  Final    Comment: Performed at [facility], [place] [address] [place], [place] [address]  Culture, blood (Routine X 2) w Reflex to ID Panel     Status: None (Preliminary result)   Collection Time: 08/27/22  3:49 PM   Specimen: BLOOD RIGHT HAND  Result Value Ref Range Status   Specimen Description BLOOD RIGHT HAND  Final   Special Requests   Final    BOTTLES DRAWN AEROBIC AND ANAEROBIC Blood Culture adequate volume   Culture   Final    NO GROWTH 2 DAYS Performed at [facility], [place] [address] [place], [place] [address]    Report Status PENDING  Incomplete  Culture, blood (Routine X 2) w Reflex to ID Panel     Status: None (Preliminary result)   Collection Time: 08/27/22  3:51 PM   Specimen: BLOOD LEFT ARM  Result Value Ref Range Status   Specimen Description BLOOD LEFT ARM  Final   Special Requests   Final     BOTTLES DRAWN AEROBIC AND ANAEROBIC Blood Culture adequate volume   Culture   Final    NO GROWTH 2 DAYS Performed at [facility], [place] [address], [place], [place] [address]    Report Status PENDING  Incomplete     Time coordinating discharge: 35 minutes  SIGNED:   Aline August, MD  [facility] 08/29/2022, 10:15 AM

## 2022-08-29 NOTE — Progress Notes (Signed)
Inpatient Rehabilitation Admission Medication Review by a Pharmacist  A complete drug regimen review was completed for this patient to identify any potential clinically significant medication issues.  High Risk Drug Classes Is patient taking? Indication by Medication  Antipsychotic Yes Compazine- N/V  Anticoagulant Yes Apixaban- VTE ppx s/p ortho x4 weeks (end date 09/23/2022)  Antibiotic Yes Azithromycin, omnicef- LLL PNA  Opioid No   Antiplatelet No   Hypoglycemics/insulin No   Vasoactive Medication Yes Norvasc, Coreg- HTN  Chemotherapy No   Other Yes Trazodone- sleep Colchicine- acute gout Valium- anxiety Protonix- GERD Crestor- HLD Zanaflex- muscle spasms     Type of Medication Issue Identified Description of Issue Recommendation(s)  Drug Interaction(s) (clinically significant)     Duplicate Therapy     Allergy     No Medication Administration End Date     Incorrect Dose     Additional Drug Therapy Needed     Significant med changes from prior encounter (inform family/care partners about these prior to discharge).    Other  PTA meds: Viagra Demadex Tramadol diovan Restart PTA meds when and if necessary during CIR admission or at time of discharge, if warranted     Clinically significant medication issues were identified that warrant physician communication and completion of prescribed/recommended actions by midnight of the next day:  No   Time spent performing this drug regimen review (minutes):  30   Maurice Cruz BS, PharmD, BCPS Clinical Pharmacist 08/29/2022 2:37 PM  Contact: [phone] after 3 PM  "Be curious, not judgmental..." -Jamal Maes

## 2022-08-30 DIAGNOSIS — G959 Disease of spinal cord, unspecified: Secondary | ICD-10-CM | POA: Diagnosis not present

## 2022-08-30 DIAGNOSIS — G8220 Paraplegia, unspecified: Secondary | ICD-10-CM | POA: Diagnosis not present

## 2022-08-30 DIAGNOSIS — R5381 Other malaise: Principal | ICD-10-CM

## 2022-08-30 DIAGNOSIS — I10 Essential (primary) hypertension: Secondary | ICD-10-CM | POA: Diagnosis not present

## 2022-08-30 LAB — COMPREHENSIVE METABOLIC PANEL
ALT: 52 U/L — ABNORMAL HIGH (ref 0–44)
AST: 34 U/L (ref 15–41)
Albumin: 2.7 g/dL — ABNORMAL LOW (ref 3.5–5.0)
Alkaline Phosphatase: 77 U/L (ref 38–126)
Anion gap: 7 (ref 5–15)
BUN: 33 mg/dL — ABNORMAL HIGH (ref 6–20)
CO2: 22 mmol/L (ref 22–32)
Calcium: 8.6 mg/dL — ABNORMAL LOW (ref 8.9–10.3)
Chloride: 109 mmol/L (ref 98–111)
Creatinine, Ser: 1.62 mg/dL — ABNORMAL HIGH (ref 0.61–1.24)
GFR, Estimated: 50 mL/min — ABNORMAL LOW (ref >60)
Glucose, Bld: 118 mg/dL — ABNORMAL HIGH (ref 70–99)
Potassium: 4.5 mmol/L (ref 3.5–5.1)
Sodium: 138 mmol/L (ref 135–145)
Total Bilirubin: 0.2 mg/dL — ABNORMAL LOW (ref 0.3–1.2)
Total Protein: 6.8 g/dL (ref 6.5–8.1)

## 2022-08-30 LAB — CBC WITH DIFFERENTIAL/PLATELET
Abs Immature Granulocytes: 0.08 10*3/uL — ABNORMAL HIGH (ref 0.00–0.07)
Basophils Absolute: 0 10*3/uL (ref 0.0–0.1)
Basophils Relative: 0 %
Eosinophils Absolute: 0 10*3/uL (ref 0.0–0.5)
Eosinophils Relative: 0 %
HCT: 27 % — ABNORMAL LOW (ref 39.0–52.0)
Hemoglobin: 8.9 g/dL — ABNORMAL LOW (ref 13.0–17.0)
Immature Granulocytes: 1 %
Lymphocytes Relative: 9 %
Lymphs Abs: 1.3 10*3/uL (ref 0.7–4.0)
MCH: 27.4 pg (ref 26.0–34.0)
MCHC: 33 g/dL (ref 30.0–36.0)
MCV: 83.1 fL (ref 80.0–100.0)
Monocytes Absolute: 0.8 10*3/uL (ref 0.1–1.0)
Monocytes Relative: 5 %
Neutro Abs: 13.2 10*3/uL — ABNORMAL HIGH (ref 1.7–7.7)
Neutrophils Relative %: 85 %
Platelets: 361 10*3/uL (ref 150–400)
RBC: 3.25 MIL/uL — ABNORMAL LOW (ref 4.22–5.81)
RDW: 13.2 % (ref 11.5–15.5)
WBC: 15.4 10*3/uL — ABNORMAL HIGH (ref 4.0–10.5)
nRBC: 0 % (ref 0.0–0.2)

## 2022-08-30 LAB — MAGNESIUM: Magnesium: 2 mg/dL (ref 1.7–2.4)

## 2022-08-30 NOTE — Evaluation (Signed)
Physical Therapy Assessment and Plan  Patient Details  Name: Maurice Cruz MRN: [id number] Date of Birth: 1965-12-07  PT Diagnosis: Abnormality of gait, Difficulty walking, Impaired sensation, Muscle spasms, and Paraplegia Rehab Potential: Excellent ELOS: 12-14 days   Today's Date: 08/30/2022 PT Individual Time: 8144-8185 PT Individual Time Calculation (min): 60 min    Hospital Problem: Principal Problem:   Debility Active Problems:   Spinal cord disorder [facility])   Past Medical History:  Past Medical History:  Diagnosis Date   Anxiety    Asthma    as a child   Depression    ED (erectile dysfunction)    GERD (gastroesophageal reflux disease)    Hx of spinal cord injury 03/2020   Hypertension    Neurogenic bladder    self caths   Neurogenic bowel    has to be digitially stimulated - 04/24/21   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[place])    Past Surgical History:  Past Surgical History:  Procedure Laterality Date   Anorectal biospy  02/09/2021   Colon Biospy  02/09/2021   IR ANGIO INTRA EXTRACRAN SEL COM CAROTID INNOMINATE BILAT MOD SED  04/25/2021   IR ANGIO VERTEBRAL SEL SUBCLAVIAN INNOMINATE UNI L MOD SED  04/25/2021   IR ANGIO VERTEBRAL SEL VERTEBRAL UNI R MOD SED  04/25/2021   IR ANGIO/SPINAL LEFT  04/25/2021   IR ANGIO/SPINAL LEFT  04/25/2021   IR ANGIO/SPINAL LEFT  04/25/2021   IR ANGIO/SPINAL LEFT  04/25/2021   IR ANGIO/SPINAL LEFT  04/25/2021   IR ANGIO/SPINAL LEFT  04/25/2021   IR ANGIO/SPINAL LEFT  04/25/2021   IR ANGIO/SPINAL LEFT  04/25/2021   IR ANGIO/SPINAL LEFT  04/25/2021   IR ANGIO/SPINAL LEFT  04/25/2021   IR ANGIO/SPINAL LEFT  04/25/2021   IR ANGIO/SPINAL LEFT  04/25/2021   IR ANGIO/SPINAL LEFT  04/25/2021   IR ANGIO/SPINAL RIGHT  04/25/2021   IR ANGIO/SPINAL RIGHT  04/25/2021   IR ANGIO/SPINAL RIGHT  04/25/2021   IR ANGIO/SPINAL RIGHT  04/25/2021   IR ANGIO/SPINAL RIGHT  04/25/2021   IR ANGIO/SPINAL RIGHT  04/25/2021   IR ANGIO/SPINAL  RIGHT  04/25/2021   IR ANGIO/SPINAL RIGHT  04/25/2021   IR ANGIO/SPINAL RIGHT  04/25/2021   IR ANGIO/SPINAL RIGHT  04/25/2021   IR ANGIO/SPINAL RIGHT  04/25/2021   IR ANGIO/SPINAL RIGHT  04/25/2021   IR ANGIO/SPINAL RIGHT  04/25/2021   IR ANGIO/SPINAL RIGHT  04/25/2021   IR ANGIOGRAM EXTREMITY BILATERAL  04/25/2021   IR RADIOLOGIST EVAL & MGMT  03/21/2021   RADIOLOGY WITH ANESTHESIA N/A 04/25/2021   Procedure: IR WITH ANESTHESIA SPINAL ANGIOGRAM;  Surgeon: Luanne Bras, MD;  Location: [facility];  Service: Radiology;  Laterality: N/A;    Assessment & Plan Clinical Impression: Maurice Cruz is a 56 year old male with a history of spinal cord hematoma in 2022. Initially diagnosed while living in [place] in 2020 with BLE weakness. 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 He presented to [facility] ED on 08/25/2022 complaining of several history of BLE weakness, left greater than right. NS and neurology consulted. MRI with chronic changes. Neurology confirmed no TM diagnosis  and MRI of complete spine negative for cord compression and stable cord hemorrhage as compared to July 2022. The patient is followed as outpatient with Dr. [name]. AKI atop CKD noted. Lab work-up, LE venous duplex performed. Complained of right ankle pain on 11/27 and started on colchicine for acute  gout flare. Also complained of lingering cough and had mild fever to 100.6. Chest x-ray and blood cultures performed. Possible LLL pneumonia and started on Rocephin and Zithromax on 11/29. Switching to Ceftin for 3 more days today, 11/30. Tolerating HH diet. 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   Has history of neurogenic bowel and bladder and self-caths. History of hyperlipidemia, CKD, hypertension, GERD.   Follows with Drs. Sater and Lovorn as  outpatient.  Patient transferred to CIR on 08/29/2022 .   Patient currently requires min with mobility secondary to muscle weakness and muscle joint tightness, impaired timing and sequencing, abnormal tone, and unbalanced muscle activation, and decreased standing balance and decreased balance strategies.  Prior to hospitalization, patient was modified independent  with mobility and lived with Family (parents) in a House home.  Home access is 1Stairs to enter.  Patient will benefit from skilled PT intervention to maximize safe functional mobility, minimize fall risk, and decrease caregiver burden for planned discharge home with 24 hour supervision.  Anticipate patient will benefit from follow up OP at discharge.  PT - End of Session Activity Tolerance: Tolerates 30+ min activity with multiple rests Endurance Deficit: Yes Endurance Deficit Description: Noted decr muscle strength with fatigue PT Assessment Rehab Potential (ACUTE/IP ONLY): Excellent PT Barriers to Discharge: Home environment access/layout;Neurogenic Bowel & Bladder PT Patient demonstrates impairments in the following area(s): Balance;Safety;Endurance;Motor;Skin Integrity;Sensory PT Transfers Functional Problem(s): Car;Floor;Furniture;Bed to Chair PT Locomotion Functional Problem(s): Ambulation;Stairs PT Plan PT Intensity: Minimum of 1-2 x/day ,45 to 90 minutes PT Frequency: 5 out of 7 days PT Duration Estimated Length of Stay: 12-14 days PT Treatment/Interventions: DME/adaptive equipment instruction;Discharge planning;Ambulation/gait training;Functional mobility training;Splinting/orthotics;Psychosocial support;Pain management;Therapeutic Activities;UE/LE Strength taining/ROM;Wheelchair propulsion/positioning;UE/LE Coordination activities;Therapeutic Exercise;Stair training;Skin care/wound management;Patient/family education;Neuromuscular re-education;Functional electrical stimulation;Disease management/prevention;Community  reintegration;Balance/vestibular training PT Transfers Anticipated Outcome(s): mod I with LRAD PT Locomotion Anticipated Outcome(s): mod I w/c, supervision gait PT Recommendation Recommendations for Other Services: Therapeutic Recreation consult Therapeutic Recreation Interventions: Outing/community reintergration Follow Up Recommendations: Home health PT Patient destination: Home Equipment Recommended: Other (comment) Equipment Details: Pt owns required equipment   PT Evaluation Precautions/Restrictions Precautions Precautions: Fall Restrictions Weight Bearing Restrictions: No General   Vital Signs  Pain Pain Assessment Pain Scale: 0-10 Pain Score: 0-No pain Pain Interference Pain Interference Pain Effect on Sleep: 2. Occasionally Pain Interference with Therapy Activities: 0. Does not apply - I have not received rehabilitationtherapy in the past 5 days Pain Interference with Day-to-Day Activities: 1. Rarely or not at all Home Living/Prior [place] Available Help at Discharge: Family;Available 24 hours/day Type of Home: House Home Access: Stairs to enter [employer] of Steps: 1 Home Layout: Multi-level Alternate Level Stairs-Number of Steps: has stair lifts on all steps Bathroom Shower/Tub: Tub/shower unit;Walk-in shower Bathroom Toilet: Standard Bathroom Accessibility: Yes Additional Comments: bathroom only accessible by RW, at baseline PTA would used w/c to get to door then used wheeled office chair to get to tub then would transfer into without DME to sit on tub floor  Lives With: Family (parents) Prior Function Level of Independence: Requires assistive device for independence;Independent with basic ADLs;Independent with homemaking with ambulation;Independent with gait  Able to Take Stairs?: No Driving: No Vocation: On disability Leisure: Hobbies-no Vision/Perception  Vision - History Ability to See in Adequate Light: 0 Adequate Vision -  Assessment Additional Comments: reports difficulty near sight with need for optomotry visit but reports  no changes related to recent functional decline Perception Perception: Within Functional Limits Praxis Praxis: Intact  Cognition Overall Cognitive Status: Within Functional Limits for tasks assessed Arousal/Alertness: Awake/alert Orientation Level: Oriented X4 Memory: Appears intact Awareness: Appears intact Problem Solving: Appears intact Safety/Judgment: Appears intact Sensation Sensation Light Touch: Impaired by gross assessment Additional Comments: diminished sensation below T10 level, no change from baseline; intact BUE Coordination Gross Motor Movements are Fluid and Coordinated: No Fine Motor Movements are Fluid and Coordinated: No Coordination and Movement Description: BLE dyscoordinated d/t SCI Finger Nose Finger Test: slow bilaterally with decreased precision with speed Motor  Motor Motor: Paraplegia;Clonus;Abnormal tone Motor - Skilled Clinical Observations: chronic paraparesis, R>L. RLE spasticity and clonus   Trunk/Postural Assessment  Cervical Assessment Cervical Assessment: Within Functional Limits Thoracic Assessment Thoracic Assessment: Within Functional Limits Lumbar Assessment Lumbar Assessment: Within Functional Limits Postural Control Postural Control: Deficits on evaluation (trunk control slightly altered by weakened core d/t SCI)  Balance Balance Balance Assessed: Yes Static Sitting Balance Static Sitting - Balance Support: Feet supported Static Sitting - Level of Assistance: 6: Modified independent (Device/Increase time) Dynamic Sitting Balance Dynamic Sitting - Balance Support: Feet supported Dynamic Sitting - Level of Assistance: 5: Stand by assistance Static Standing Balance Static Standing - Balance Support: Bilateral upper extremity supported Static Standing - Level of Assistance: 4: Min assist Dynamic Standing Balance Dynamic Standing  - Balance Support: Bilateral upper extremity supported Dynamic Standing - Level of Assistance: 4: Min assist Extremity Assessment  RUE Assessment RUE Assessment: Within Functional Limits General Strength Comments: Triceps 4-/5 LUE Assessment LUE Assessment: Within Functional Limits General Strength Comments: Triceps 4-/5 RLE Assessment RLE Assessment: Exceptions to [facility] RLE Strength Right Hip Flexion: 2-/5 Right Knee Flexion: 3+/5 Right Knee Extension: 3-/5 Right Ankle Dorsiflexion: 4+/5 Right Ankle Plantar Flexion: 4+/5 RLE Tone RLE Tone: Modified Ashworth (gastroc clonus noted) Body Part - Modified Ashworth Scale: Hamstrings;Gastrocnemius;Quadriceps Hamstrings - Modified [place]worth Scale for Grading Hypertonia RLE: Considerable increase in muschle tone, passive movement difficult QUADRICEPS - Modified [place] Scale for Grading Hypertonia RLE: More marked increase in muscle tone through most of the ROM, but affected part(s) easily moved GASTROCNEMIUS - Modified [place] Scale for Grading Hypertonia RLE: More marked increase in muscle tone through most of the ROM, but affected part(s) easily moved LLE Assessment LLE Assessment: Exceptions to [facility] LLE Strength Left Hip Flexion: 4+/5 Left Knee Flexion: 4+/5 Left Knee Extension: 4+/5 Left Ankle Dorsiflexion: 5/5 Left Ankle Plantar Flexion: 5/5 LLE Tone LLE Tone Comments: Mild clonus ~2 beated in calf  Care Tool Care Tool Bed Mobility Roll left and right activity   Roll left and right assist level: Supervision/Verbal cueing    Sit to lying activity   Sit to lying assist level: Supervision/Verbal cueing    Lying to sitting on side of bed activity   Lying to sitting on side of bed assist level: the ability to move from lying on the back to sitting on the side of the bed with no back support.: Supervision/Verbal cueing     Care Tool Transfers Sit to stand transfer   Sit to stand assist level: Minimal Assistance - Patient > 75%     Chair/bed transfer   Chair/bed transfer assist level: Minimal Assistance - Patient > 75%     Toilet transfer   Assist Level: Moderate Assistance - Patient 50 - 74%    Car transfer   Car transfer assist level: Contact Guard/Touching assist (squat pivot pt would likely require min A for standing transfer)  Care Tool Locomotion Ambulation   Assist level: Minimal Assistance - Patient > 75% Assistive device: Walker-rolling Max distance: 30 ft  Walk 10 feet activity   Assist level: Minimal Assistance - Patient > 75% Assistive device: Walker-rolling   Walk 50 feet with 2 turns activity Walk 50 feet with 2 turns activity did not occur: Safety/medical concerns (fatigue/endurance)      Walk 150 feet activity Walk 150 feet activity did not occur: Safety/medical concerns      Walk 10 feet on uneven surfaces activity Walk 10 feet on uneven surfaces activity did not occur: Safety/medical concerns (fatigue/balance)      Stairs Stair activity did not occur: Safety/medical concerns        Walk up/down 1 step activity Walk up/down 1 step or curb (drop down) activity did not occur: Safety/medical concerns      Walk up/down 4 steps activity Walk up/down 4 steps activity did not occur: Safety/medical concerns      Walk up/down 12 steps activity Walk up/down 12 steps activity did not occur: Safety/medical concerns      Pick up small objects from floor Pick up small object from the floor (from standing position) activity did not occur: Safety/medical concerns      Wheelchair Is the patient using a wheelchair?: Yes Type of Wheelchair: Manual   Wheelchair assist level: Set up assist Max wheelchair distance: 250 ft  Wheel 50 feet with 2 turns activity   Assist Level: Independent  Wheel 150 feet activity   Assist Level: Independent    Refer to Care Plan for Long Term Goals  SHORT TERM GOAL WEEK 1 PT Short Term Goal 1 (Week 1): Pt will perform sit to stand with CGA or better PT  Short Term Goal 2 (Week 1): Pt will ambulate 100 ft with LRAD PT Short Term Goal 3 (Week 1): Pt will improve TUG score by MCID  Recommendations for other services: Therapeutic Recreation  Outing/community reintegration  Skilled Therapeutic Intervention pt received in bed and agreeable to therapy. No complaint of pain. Evaluation completed (see details above) with patient education regarding purpose of PT evaluation, PT POC and goals, therapy schedule, weekly team meetings, and other CIR information including safety plan and fall risk safety. Pt performed the below functional mobility tasks with the specified levels of skilled cuing and assistance.  MMT and sensory testing as documented. Noted increased spasticity, see flow sheet for MAS. W/c mobility noted to be near mod I level as pt has used chair since 2021. Provided with RW and adjusted for fit. Min A in standing largely for tone on standing, which pt notes is new. Performed car transfer at squat pivot level, as pt will likely use w/c for community distances initially at d/c. Pt returned to room and to bed to rest for OT session, was left with all needs in reach and alarm active.   Mobility Bed Mobility Bed Mobility: Supine to Sit;Sit to Supine Supine to Sit: Supervision/Verbal cueing Sit to Supine: Supervision/Verbal cueing Transfers Transfers: Squat Pivot Transfers;Sit to Stand;Stand Pivot Transfers Sit to Stand: Minimal Assistance - Patient > 75% Stand Pivot Transfers: Minimal Assistance - Patient > 75% Stand Pivot Transfer Details: Manual facilitation for placement;Verbal cues for gait pattern;Verbal cues for safe use of DME/AE Squat Pivot Transfers: Supervision/Verbal cueing Transfer (Assistive device): Rolling walker Locomotion  Gait Ambulation: Yes Gait Assistance: Minimal Assistance - Patient > 75% Gait Distance (Feet): 30 Feet Assistive device: Rolling walker Gait Assistance Details: Verbal cues for safe  use of DME/AE;Verbal  cues for gait pattern   Discharge Criteria: Patient will be discharged from PT if patient refuses treatment 3 consecutive times without medical reason, if treatment goals not met, if there is a change in medical status, if patient makes no progress towards goals or if patient is discharged from hospital.  The above assessment, treatment plan, treatment alternatives and goals were discussed and mutually agreed upon: by patient  Mickel Fuchs 08/30/2022, 12:38 PM

## 2022-08-30 NOTE — Progress Notes (Signed)
Inpatient Rehabilitation Care Coordinator Assessment and Plan Patient Details  Name: Maurice Cruz MRN: [id number] Date of Birth: [birth year]  Today's Date: 08/30/2022  Hospital Problems: Principal Problem:   Debility Active Problems:   Spinal cord disorder [facility])  Past Medical History:  Past Medical History:  Diagnosis Date   Anxiety    Asthma    as a child   Depression    ED (erectile dysfunction)    GERD (gastroesophageal reflux disease)    Hx of spinal cord injury 03/2020   Hypertension    Neurogenic bladder    self caths   Neurogenic bowel    has to be digitially stimulated - 04/24/21   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[place])    Past Surgical History:  Past Surgical History:  Procedure Laterality Date   Anorectal biospy  02/09/2021   Colon Biospy  02/09/2021   IR ANGIO INTRA EXTRACRAN SEL COM CAROTID INNOMINATE BILAT MOD SED  04/25/2021   IR ANGIO VERTEBRAL SEL SUBCLAVIAN INNOMINATE UNI L MOD SED  04/25/2021   IR ANGIO VERTEBRAL SEL VERTEBRAL UNI R MOD SED  04/25/2021   IR ANGIO/SPINAL LEFT  04/25/2021   IR ANGIO/SPINAL LEFT  04/25/2021   IR ANGIO/SPINAL LEFT  04/25/2021   IR ANGIO/SPINAL LEFT  04/25/2021   IR ANGIO/SPINAL LEFT  04/25/2021   IR ANGIO/SPINAL LEFT  04/25/2021   IR ANGIO/SPINAL LEFT  04/25/2021   IR ANGIO/SPINAL LEFT  04/25/2021   IR ANGIO/SPINAL LEFT  04/25/2021   IR ANGIO/SPINAL LEFT  04/25/2021   IR ANGIO/SPINAL LEFT  04/25/2021   IR ANGIO/SPINAL LEFT  04/25/2021   IR ANGIO/SPINAL LEFT  04/25/2021   IR ANGIO/SPINAL RIGHT  04/25/2021   IR ANGIO/SPINAL RIGHT  04/25/2021   IR ANGIO/SPINAL RIGHT  04/25/2021   IR ANGIO/SPINAL RIGHT  04/25/2021   IR ANGIO/SPINAL RIGHT  04/25/2021   IR ANGIO/SPINAL RIGHT  04/25/2021   IR ANGIO/SPINAL RIGHT  04/25/2021   IR ANGIO/SPINAL RIGHT  04/25/2021   IR ANGIO/SPINAL RIGHT  04/25/2021   IR ANGIO/SPINAL RIGHT  04/25/2021   IR ANGIO/SPINAL RIGHT  04/25/2021   IR ANGIO/SPINAL RIGHT  04/25/2021   IR  ANGIO/SPINAL RIGHT  04/25/2021   IR ANGIO/SPINAL RIGHT  04/25/2021   IR ANGIOGRAM EXTREMITY BILATERAL  04/25/2021   IR RADIOLOGIST EVAL & MGMT  03/21/2021   RADIOLOGY WITH ANESTHESIA N/A 04/25/2021   Procedure: IR WITH ANESTHESIA SPINAL ANGIOGRAM;  Surgeon: Luanne Bras, MD;  Location: [place];  Service: Radiology;  Laterality: N/A;   Social History:  reports that he has never smoked. He has never used smokeless tobacco. He reports that he does not currently use alcohol. He reports that he does not use drugs.  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 Education: high school grad Health Literacy - How often do you need to have someone help you when you read instructions, pamphlets, or other written material from your doctor or pharmacy?: Never Writes: Yes Employment Status: Disabled Date Retired/Disabled/Unemployed: Pt became disabled 2 yrs ago. SSDI began April 2023 Legal History/Current Legal Issues: Denies Guardian/Conservator: N/A  Abuse/Neglect Abuse/Neglect Assessment Can Be Completed: Yes Physical Abuse: Denies Verbal Abuse: Denies Sexual Abuse: Denies Exploitation of patient/patient's resources: Denies Self-Neglect: Denies  Patient response to: Social Isolation - How often do you feel lonely or isolated from those around you?: Never  Emotional Status Pt's affect, behavior and adjustment status: Pt in good spirits at time of visit Recent Psychosocial Issues: Denies Psychiatric History: Denies Substance Abuse  History: Denies  Patient / Family Perceptions, Expectations & Goals Pt/Family understanding of illness & functional limitations: Pt has a general understanding of pt care needs Premorbid pt/family roles/activities: Independent Anticipated changes in roles/activities/participation: Assistance with ADLs/IADLs Pt/family expectations/goals: Pt goal is to work on  "getting functionality back if not better....would like to be how I was before, when I was walking."  [employer]: None Premorbid Home Care/DME Agencies: None Transportation available at discharge: parents Is the patient able to respond to transportation needs?: Yes In the past 12 months, has lack of transportation kept you from medical appointments or from getting medications?: No In the past 12 months, has lack of transportation kept you from meetings, work, or from getting things needed for daily living?: No Resource referrals recommended: Neuropsychology  Discharge Planning Living Arrangements: Parent Support Systems: Parent Type of Residence: Private residence Insurance Resources: Multimedia programmer (specify) Nurse, mental health) Financial Resources: SSD Financial Screen Referred: No Living Expenses: Lives with family Money Management: Family Does the patient have any problems obtaining your medications?: No Home Management: Pt repots his mother prepares all meals and does house cleaning. Patient/Family Preliminary Plans: TBD Care Coordinator Barriers to Discharge: Decreased caregiver support, Lack of/limited family support, Insurance for SNF coverage, Neurogenic Bowel & Bladder, Incontinence Care Coordinator Anticipated Follow Up Needs: HH/OP  Clinical Impression SW met with pt in room to introduce self, explain role, and discuss discharge process. Pt is not a English as a second language teacher. No HCPOA. DME: RW and personal w/c (has with him). Reports he uses [employer] as vendor for catheters. Discussed with SW some barriers with going  in public (no discrete catheters). SW informed will follow-up with vendor to see if items are covered under insruance. He is aware SW will/has made contact with his mother.   [id number] left message for pt mother Knox Royalty [phone]) to introduce self, explain role, and discuss dishcarge process.  SW received updates from [employer] (p:[phone]/email- [email or web address]) that SW will need to send catheter form for review.   Madora Barletta A Parth Mccormac 08/30/2022, 2:57 PM

## 2022-08-30 NOTE — Progress Notes (Signed)
Inpatient Rehabilitation  Patient information reviewed and entered into eRehab system by Deakon Frix M. Aundrey Elahi, M.A., CCC/SLP, PPS Coordinator.  Information including medical coding, functional ability and quality indicators will be reviewed and updated through discharge.    

## 2022-08-30 NOTE — Progress Notes (Signed)
Physical Therapy Session Note  Patient Details  Name: Maurice Cruz MRN: [id number] Date of Birth: [birth year]  Today's Date: 08/30/2022 PT Individual Time: 9038-3338 PT Individual Time Calculation (min): 77 min   Short Term Goals: Week 1:  PT Short Term Goal 1 (Week 1): Pt will perform sit to stand with CGA or better PT Short Term Goal 2 (Week 1): Pt will ambulate 100 ft with LRAD PT Short Term Goal 3 (Week 1): Pt will improve TUG score by MCID  Skilled Therapeutic Interventions/Progress Updates:  pt received in w/c and agreeable to therapy. No complaint of pain. Pt propels and managed w/c throughout session with set up A. Pt performed TUG with trials =2:02, 2:01, 1:20, with average of 1:54 with RW and CGA throughout. Navigated 6" stairs x 4 with min A for balance and guarding. Pt reminded of step to pattern and ascended with stronger LLE and descended with RLE. Pt navigated ramp with RW with min A for increased balance challenge. Pt ambulated 3 x 120-150 ft with RW and CGA-min A. Added toe cap for toe catch. Pt reports he was working with outpatient therapy on this PTA, as he has active knee flexion but struggles with the motor plan during gait. Pt with occ external rotation of L hip and using circumduction to compensate for RLE weakness. Cued for increased heel-toe with L foot. Pt returned to bed with supervision after session and was left with all needs in reach and alarm active.   Therapy Documentation Precautions:  Precautions Precautions: Fall Restrictions Weight Bearing Restrictions: No General:       Therapy/Group: Individual Therapy  Mickel Fuchs 08/30/2022, 3:37 PM

## 2022-08-30 NOTE — Progress Notes (Signed)
Met with patient. Oriented to rehab. Informed that have team conference on Tuesday. Verified that was cathing himself 4 to 6 x a day at home. Had some incontinence at home. Informed that need to monitor liquid intake that goal is be continent. Said that doesn't do bowel program at home with dig stem that takes a combination of meds and has a BM. Discussed diet for gout. Verified that has lift chair at home.

## 2022-08-30 NOTE — Progress Notes (Signed)
PROGRESS NOTE   Subjective/Complaints: Had a good night. Right ankle continues to improve. Ready to get started with therapy  ROS: Patient denies fever, rash, sore throat, blurred vision, dizziness, nausea, vomiting, diarrhea, cough, shortness of breath or chest pain, joint or back/neck pain, headache, or mood change.    Objective:   No results found. Recent Labs    08/29/22 0714 08/30/22 0507  WBC 15.1* 15.4*  HGB 9.7* 8.9*  HCT 28.9* 27.0*  PLT 332 361   Recent Labs    08/29/22 0714 08/30/22 0507  NA 139 138  K 4.3 4.5  CL 108 109  CO2 19* 22  GLUCOSE 124* 118*  BUN 24* 33*  CREATININE 1.75* 1.62*  CALCIUM 9.3 8.6*    Intake/Output Summary (Last 24 hours) at 08/30/2022 1050 Last data filed at 08/30/2022 0746 Gross per 24 hour  Intake 480 ml  Output 1950 ml  Net -1470 ml        Physical Exam: Vital Signs Blood pressure 128/87, pulse 64, temperature 97.8 F (36.6 C), temperature source Oral, resp. rate 18, height 5\' 10"  (1.778 m), weight 82.9 kg, SpO2 100 %.  General: Alert and oriented x 3, No apparent distress HEENT: Head is normocephalic, atraumatic, PERRLA, EOMI, sclera anicteric, oral mucosa pink and moist, dentition intact, ext ear canals clear,  Neck: Supple without JVD or lymphadenopathy Heart: Reg rate and rhythm. No murmurs rubs or gallops Chest: CTA bilaterally without wheezes, rales, or rhonchi; no distress Abdomen: Soft, non-tender, non-distended, bowel sounds positive. Extremities: No clubbing, cyanosis, or edema. Pulses are 2+ Psych: Pt's affect is appropriate. Pt is cooperative Skin: Clean and intact without signs of breakdown Neuro:  Alert and oriented x 3. Normal insight and awareness. Intact Memory. Normal language and speech. Cranial nerve exam unremarkable. UE Motor 5/5. RLE 1+ prox to 2-3/5 ADF/PF. LLE 3- to 3/5 prox to 3/5 distally. T10 sensory level Musculoskeletal: right  foot/ankle minimally tender    Assessment/Plan: 1. Functional deficits which require 3+ hours per day of interdisciplinary therapy in a comprehensive inpatient rehab setting. Physiatrist is providing close team supervision and 24 hour management of active medical problems listed below. Physiatrist and rehab team continue to assess barriers to discharge/monitor patient progress toward functional and medical goals  Care Tool:  Bathing              Bathing assist       Upper Body Dressing/Undressing Upper body dressing        Upper body assist      Lower Body Dressing/Undressing Lower body dressing            Lower body assist       Toileting Toileting    Toileting assist       Transfers Chair/bed transfer  Transfers assist           Locomotion Ambulation   Ambulation assist              Walk 10 feet activity   Assist           Walk 50 feet activity   Assist           Walk 150  feet activity   Assist           Walk 10 feet on uneven surface  activity   Assist           Wheelchair     Assist               Wheelchair 50 feet with 2 turns activity    Assist            Wheelchair 150 feet activity     Assist          Blood pressure 128/87, pulse 64, temperature 97.8 F (36.6 C), temperature source Oral, resp. rate 18, height 5\' 10"  (1.778 m), weight 82.9 kg, SpO2 100 %.  Medical Problem List and Plan: 1. 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              -pt is clinically a T10 level. 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  Antithrombotics: -DVT/anticoagulation:  Mechanical:  Antiembolism stockings, knee (TED hose) Bilateral lower extremities              -antiplatelet therapy: none 3. Pain Management: Tylenol as needed 4. Mood/Behavior/Sleep: LCSW to evaluate and provide emotional support             -antipsychotic agents: n/a 5. Neuropsych/cognition: This patient is capable of making decisions on his own behalf. 6. Skin/Wound Care: Routine skin care checks 7. Fluids/Electrolytes/Nutrition: Routine Is and Os and follow-up chemistries on admit   8. AKI on CKD: creatinine is improving/? at baseline             -BUN/Cr appear in range. Encourage appropriate PO -f/u labs Monday  9. Gout: acute flare 11/29--appears to be in less pain today             -continue colchicine 0.6 mg daily             -transition to prednisone 40 mg for 5 days from 12/1             -started on allopurinol today for prophylaxis   10. Hypertension             -continue Norvasc 10 mg daily             -continue Coreg 25 mg BID             -bp under reasonable control 11. Spasticity: continue Valium 2 mg q HS, Zanaflex 2 mg q HS             -these seem to be working for him, minimal resting LE tone 12. GERD: continue Protonix   13. Hyperlipidemia: continue Crestor   14. LLL pneumonia: transition to Ceftin today for 3 days from 12/1             -IS/FV             -OOB 15. Anemia/chronic disease/dilutional: follow-up CBC   16. Leukocytosis: on steroids and getting abx for pneumonia; currently afebrile             -wbc's still 15k. Pt  afebrile 17. Neurogenic bladder:             -  pt self-caths 3-4 x per day---a little more with IV running 18. Pt not on an established bowel program. He uses miralax daily and has a bm every 2-3 days. He says he's tried suppositories, dig stim, enemas, etc and they didn't work for him. He had an incontinent bm again this morning             -continue miralax per home regimen    LOS: 1 days A FACE TO FACE EVALUATION WAS PERFORMED  Meredith Staggers 08/30/2022, 10:50 AM

## 2022-08-30 NOTE — Evaluation (Signed)
Occupational Therapy Assessment and Plan  Patient Details  Name: Maurice Cruz MRN: [id number] Date of Birth: 1966/07/10  OT Diagnosis: hemiplegia affecting dominant side, muscle weakness (generalized), and neurogenic bowel/bladder, incoordination Rehab Potential: Rehab Potential (ACUTE ONLY): Good ELOS: 12-14 days   Today's Date: 08/30/2022 OT Individual Time: 1050-1203 OT Individual Time Calculation (min): 73 min     Hospital Problem: Principal Problem:   Debility Active Problems:   Spinal cord disorder ([place])   Past Medical History:  Past Medical History:  Diagnosis Date   Anxiety    Asthma    as a child   Depression    ED (erectile dysfunction)    GERD (gastroesophageal reflux disease)    Hx of spinal cord injury 03/2020   Hypertension    Neurogenic bladder    self caths   Neurogenic bowel    has to be digitially stimulated - 04/24/21   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[place])    Past Surgical History:  Past Surgical History:  Procedure Laterality Date   Anorectal biospy  02/09/2021   Colon Biospy  02/09/2021   IR ANGIO INTRA EXTRACRAN SEL COM CAROTID INNOMINATE BILAT MOD SED  04/25/2021   IR ANGIO VERTEBRAL SEL SUBCLAVIAN INNOMINATE UNI L MOD SED  04/25/2021   IR ANGIO VERTEBRAL SEL VERTEBRAL UNI R MOD SED  04/25/2021   IR ANGIO/SPINAL LEFT  04/25/2021   IR ANGIO/SPINAL LEFT  04/25/2021   IR ANGIO/SPINAL LEFT  04/25/2021   IR ANGIO/SPINAL LEFT  04/25/2021   IR ANGIO/SPINAL LEFT  04/25/2021   IR ANGIO/SPINAL LEFT  04/25/2021   IR ANGIO/SPINAL LEFT  04/25/2021   IR ANGIO/SPINAL LEFT  04/25/2021   IR ANGIO/SPINAL LEFT  04/25/2021   IR ANGIO/SPINAL LEFT  04/25/2021   IR ANGIO/SPINAL LEFT  04/25/2021   IR ANGIO/SPINAL LEFT  04/25/2021   IR ANGIO/SPINAL LEFT  04/25/2021   IR ANGIO/SPINAL RIGHT  04/25/2021   IR ANGIO/SPINAL RIGHT  04/25/2021   IR ANGIO/SPINAL RIGHT  04/25/2021   IR ANGIO/SPINAL RIGHT  04/25/2021   IR ANGIO/SPINAL RIGHT  04/25/2021   IR  ANGIO/SPINAL RIGHT  04/25/2021   IR ANGIO/SPINAL RIGHT  04/25/2021   IR ANGIO/SPINAL RIGHT  04/25/2021   IR ANGIO/SPINAL RIGHT  04/25/2021   IR ANGIO/SPINAL RIGHT  04/25/2021   IR ANGIO/SPINAL RIGHT  04/25/2021   IR ANGIO/SPINAL RIGHT  04/25/2021   IR ANGIO/SPINAL RIGHT  04/25/2021   IR ANGIO/SPINAL RIGHT  04/25/2021   IR ANGIOGRAM EXTREMITY BILATERAL  04/25/2021   IR RADIOLOGIST EVAL & MGMT  03/21/2021   RADIOLOGY WITH ANESTHESIA N/A 04/25/2021   Procedure: IR WITH ANESTHESIA SPINAL ANGIOGRAM;  Surgeon: Luanne Bras, MD;  Location: [place];  Service: Radiology;  Laterality: N/A;    Assessment & Plan Clinical Impression:  Maurice Cruz is a 56 year old male with a history of spinal cord hematoma in 2022. Initially diagnosed while living in [place] in 2020 with BLE weakness. 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 He presented to [facility] ED on 08/25/2022 complaining of several history of BLE weakness, left greater than right. NS and neurology consulted. MRI with chronic changes. Neurology confirmed no TM diagnosis  and MRI of complete spine negative for cord compression and stable cord hemorrhage as compared to July 2022. The patient is followed as outpatient with Dr. [name]. AKI atop CKD noted. Lab work-up, LE venous duplex performed. Complained of right ankle pain on 11/27  and started on colchicine for acute gout flare. Also complained of lingering cough and had mild fever to 100.6. Chest x-ray and blood cultures performed. Possible LLL pneumonia and started on Rocephin and Zithromax on 11/29. Switching to Ceftin for 3 more days today, 11/30. Tolerating HH diet. 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 Patient transferred to CIR on 08/29/2022 .    Patient currently requires mod with basic self-care skills  secondary to muscle weakness, decreased cardiorespiratoy endurance, abnormal tone and decreased coordination, and decreased standing balance and decreased balance strategies.  Prior to hospitalization, patient could complete all self-care with mod I to independent.  Patient will benefit from skilled intervention to decrease level of assist with basic self-care skills, increase independence with basic self-care skills, and increase level of independence with iADL prior to discharge home with care partner.  Anticipate patient will require intermittent supervision and follow up home health.  OT - End of Session Activity Tolerance: Tolerates 10 - 20 min activity with multiple rests Endurance Deficit: Yes Endurance Deficit Description: Noted decr muscle strength with fatigue OT Assessment Rehab Potential (ACUTE ONLY): Good OT Barriers to Discharge: Neurogenic Bowel & Bladder OT Patient demonstrates impairments in the following area(s): Balance;Endurance;Motor;Sensory;Skin Integrity OT Basic ADL's Functional Problem(s): Bathing;Dressing;Toileting OT Advanced ADL's Functional Problem(s): Simple Meal Preparation OT Transfers Functional Problem(s): Toilet;Tub/Shower OT Additional Impairment(s): None OT Plan OT Intensity: Minimum of 1-2 x/day, 45 to 90 minutes OT Frequency: 5 out of 7 days OT Duration/Estimated Length of Stay: 12-14 days OT Treatment/Interventions: Balance/vestibular training;Discharge planning;Pain management;Functional electrical stimulation;Self Care/advanced ADL retraining;Therapeutic Activities;UE/LE Coordination activities;Disease mangement/prevention;Functional mobility training;Patient/family education;Skin care/wound managment;Therapeutic Exercise;Community reintegration;DME/adaptive equipment instruction;Neuromuscular re-education;Psychosocial support;UE/LE Strength taining/ROM;Wheelchair propulsion/positioning OT Self Feeding Anticipated Outcome(s): Independent OT Basic  Self-Care Anticipated Outcome(s): Mod I OT Toileting Anticipated Outcome(s): Mod I OT Bathroom Transfers Anticipated Outcome(s): Mod I OT Recommendation Patient destination: Home Follow Up Recommendations: Home health OT Equipment Recommended: To be determined   OT Evaluation Precautions/Restrictions  Precautions Precautions: Fall Restrictions Weight Bearing Restrictions: No Home Living/Prior Functioning Home Living Family/patient expects to be discharged to:: Private residence Living Arrangements: Parent Available Help at Discharge: Family, Available 24 hours/day Type of Home: House Home Access: Stairs to enter Technical brewer of Steps: 1 Home Layout: Multi-level Alternate Level Stairs-Number of Steps: has stair lifts on all steps Bathroom Shower/Tub: Tub/shower unit, Multimedia programmer: Standard Bathroom Accessibility: Yes Additional Comments: bathroom only accessible by RW, at baseline PTA would used w/c to get to door then used wheeled office chair to get to tub then would transfer into without DME to sit on tub floor  Lives With: Family (parents) IADL History Homemaking Responsibilities: Yes Meal Prep Responsibility: Secondary Occupation: On disability 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 [employer] and Hobbies: music, "having fun", crime shows Prior Function Level of Independence: Requires assistive device for independence, Independent with basic ADLs, Independent with homemaking with ambulation, Independent with gait  Able to Take Stairs?: No Driving: No Vocation: On disability Leisure: Hobbies-no Vision Baseline Vision/History: 1 Wears glasses (readers) Ability to See in Adequate Light: 0 Adequate Patient Visual Report: No change from baseline Vision Assessment?: No apparent visual deficits Additional Comments: reports difficulty near sight with need for optomotry visit but reports no changes related to recent functional  decline Perception  Perception: Within Functional Limits Praxis Praxis: Intact Cognition Cognition Overall Cognitive Status: Within Functional Limits for tasks  assessed Arousal/Alertness: Awake/alert Orientation Level: Person;Place;Situation Person: Oriented Place: Oriented Situation: Oriented Memory: Appears intact Awareness: Appears intact Problem Solving: Appears intact Safety/Judgment: Appears intact Brief Interview for Mental Status (BIMS) Repetition of Three Words (First Attempt): 3 Temporal Orientation: Year: Correct Temporal Orientation: Month: Accurate within 5 days Temporal Orientation: Day: Correct Recall: "Sock": Yes, no cue required Recall: "Blue": Yes, no cue required Recall: "Bed": Yes, no cue required BIMS Summary Score: 15 Sensation Sensation Light Touch: Impaired by gross assessment Additional Comments: diminished sensation below T10 level, no change from baseline; intact BUE Coordination Gross Motor Movements are Fluid and Coordinated: No Fine Motor Movements are Fluid and Coordinated: No Coordination and Movement Description: BLE dyscoordinated d/t SCI Finger Nose Finger Test: slow bilaterally with decreased precision with speed Motor  Motor Motor: Paraplegia;Clonus;Abnormal tone Motor - Skilled Clinical Observations: chronic paraparesis, R>L. RLE spasticity and clonus  Trunk/Postural Assessment  Cervical Assessment Cervical Assessment: Within Functional Limits Thoracic Assessment Thoracic Assessment: Within Functional Limits Lumbar Assessment Lumbar Assessment: Within Functional Limits Postural Control Postural Control: Deficits on evaluation (trunk control slightly altered by weakened core d/t SCI)  Balance Balance Balance Assessed: Yes Static Sitting Balance Static Sitting - Balance Support: Feet supported Static Sitting - Level of Assistance: 6: Modified independent (Device/Increase time) Dynamic Sitting Balance Dynamic Sitting - Balance  Support: Feet supported Dynamic Sitting - Level of Assistance: 5: Stand by assistance Static Standing Balance Static Standing - Balance Support: Bilateral upper extremity supported Static Standing - Level of Assistance: 4: Min assist Dynamic Standing Balance Dynamic Standing - Balance Support: Bilateral upper extremity supported Dynamic Standing - Level of Assistance: 4: Min assist Extremity/Trunk Assessment RUE Assessment RUE Assessment: Within Functional Limits General Strength Comments: Triceps 4-/5 LUE Assessment LUE Assessment: Within Functional Limits General Strength Comments: Triceps 4-/5  Care Tool Care Tool Self Care Eating   Eating Assist Level: Set up assist    Oral Care    Oral Care Assist Level: Set up assist    Bathing   Body parts bathed by patient: Right arm;Left arm;Chest;Abdomen;Front perineal area;Right upper leg;Left upper leg;Left lower leg;Face Body parts bathed by helper: Buttocks;Right lower leg   Assist Level: Moderate Assistance - Patient 50 - 74%    Upper Body Dressing(including orthotics)   What is the patient wearing?: Pull over shirt   Assist Level: Set up assist    Lower Body Dressing (excluding footwear)   What is the patient wearing?: Incontinence brief;Pants Assist for lower body dressing: Maximal Assistance - Patient 25 - 49%    Putting on/Taking off footwear   What is the patient wearing?: Non-skid slipper socks Assist for footwear: Dependent - Patient 0%       Care Tool Toileting Toileting activity   Assist for toileting: Maximal Assistance - Patient 25 - 49%     Care Tool Bed Mobility Roll left and right activity        Sit to lying activity        Lying to sitting on side of bed activity   Lying to sitting on side of bed assist level: the ability to move from lying on the back to sitting on the side of the bed with no back support.: Contact Guard/Touching assist     Care Tool Transfers Sit to stand transfer         Chair/bed transfer         Toilet transfer   Assist Level: Moderate Assistance - Patient 50 - 74%     Care  Tool Cognition  Expression of Ideas and Wants Expression of Ideas and Wants: 4. Without difficulty (complex and basic) - expresses complex messages without difficulty and with speech that is clear and easy to understand  Understanding Verbal and Non-Verbal Content Understanding Verbal and Non-Verbal Content: 4. Understands (complex and basic) - clear comprehension without cues or repetitions   Memory/Recall Ability Memory/Recall Ability : Current season;That he or she is in a hospital/hospital unit   Refer to Care Plan for [place] [facility] OT Short Term Goal 1 (Week 1): Pt will complete 2/3 toileting steps with supervision OT Short Term Goal 2 (Week 1): Pt will complete squat pivot toilet transfer with CGA to [facility] OT Short Term Goal 3 (Week 1): Pt will demonstrate recall of modified strategies to thread BLE into LB clothing with supervision  Recommendations for other services: None    Skilled Therapeutic Intervention Patient received upright in bed upon therapy arrival and agreeable to participate in OT evaluation. No report of pain during session. Education provided on OT purpose, therapy schedule, goals for therapy, and safety policy while in rehab. Patient demonstrates RLE weakness > LLE, balance deficits, sensation impairments from T10 down, neurogenic bowel and bladder, and increased tone of the RLE with mobility resulting in difficulty completing BADL tasks without increased physical assist. Pt will benefit from skilled OT services to focus on mentioned deficits. See below for ADL and functional transfer performance. Pt remained seated in w/c at conclusion of session with chair alarm on and all needs met at end of session.  ADL ADL Eating: Set up Where Assessed-Eating: Bed level Grooming: Setup Where Assessed-Grooming: Sitting at sink Upper Body  Bathing: Setup Where Assessed-Upper Body Bathing: Bed level Lower Body Bathing: Moderate assistance Where Assessed-Lower Body Bathing: Other (Comment) (BSC) Upper Body Dressing: Setup Where Assessed-Upper Body Dressing: Edge of bed Lower Body Dressing: Maximal assistance Where Assessed-Lower Body Dressing: Other (Comment) (BSC) Toileting: Maximal assistance Where Assessed-Toileting: Bedside Commode Toilet Transfer: Moderate assistance Toilet Transfer Method: Squat pivot Science writer: Extra wide drop arm bedside commode Tub/Shower Transfer: Unable to assess Tub/Shower Transfer Method: Unable to assess Social research officer, government: Unable to assess [employer] Transfer Method: Unable to assess Mobility  Bed Mobility Bed Mobility: Supine to Sit;Sit to Supine Supine to Sit: Supervision/Verbal cueing Sit to Supine: Supervision/Verbal cueing Transfers Sit to Stand: Minimal Assistance - Patient > 75%   Discharge Criteria: Patient will be discharged from OT if patient refuses treatment 3 consecutive times without medical reason, if treatment goals not met, if there is a change in medical status, if patient makes no progress towards goals or if patient is discharged from hospital.  The above assessment, treatment plan, treatment alternatives and goals were discussed and mutually agreed upon: by patient  Blase Mess, MS, OTR/L  08/30/2022, 12:26 PM

## 2022-08-31 DIAGNOSIS — G8220 Paraplegia, unspecified: Secondary | ICD-10-CM | POA: Diagnosis not present

## 2022-08-31 DIAGNOSIS — I10 Essential (primary) hypertension: Secondary | ICD-10-CM | POA: Diagnosis not present

## 2022-08-31 DIAGNOSIS — R5381 Other malaise: Secondary | ICD-10-CM | POA: Diagnosis not present

## 2022-08-31 DIAGNOSIS — G959 Disease of spinal cord, unspecified: Secondary | ICD-10-CM | POA: Diagnosis not present

## 2022-08-31 NOTE — Progress Notes (Signed)
PROGRESS NOTE   Subjective/Complaints: Pt doing well. About to perform a bladder cath. Therapy progressing. Denies pain at present  ROS: Patient denies fever, rash, sore throat, blurred vision, dizziness, nausea, vomiting, diarrhea, cough, shortness of breath or chest pain, joint or back/neck pain, headache, or mood change.    Objective:   No results found. Recent Labs    08/29/22 0714 08/30/22 0507  WBC 15.1* 15.4*  HGB 9.7* 8.9*  HCT 28.9* 27.0*  PLT 332 361   Recent Labs    08/29/22 0714 08/30/22 0507  NA 139 138  K 4.3 4.5  CL 108 109  CO2 19* 22  GLUCOSE 124* 118*  BUN 24* 33*  CREATININE 1.75* 1.62*  CALCIUM 9.3 8.6*    Intake/Output Summary (Last 24 hours) at 08/31/2022 1120 Last data filed at 08/31/2022 1039 Gross per 24 hour  Intake 2905.53 ml  Output 1801 ml  Net 1104.53 ml        Physical Exam: Vital Signs Blood pressure 131/82, pulse (!) 52, temperature 97.8 F (36.6 C), temperature source Oral, resp. rate 16, height 5\' 10"  (1.778 m), weight 82.9 kg, SpO2 100 %.  Constitutional: No distress . Vital signs reviewed. HEENT: NCAT, EOMI, oral membranes moist Neck: supple Cardiovascular: RRR without murmur. No JVD    Respiratory/Chest: CTA Bilaterally without wheezes or rales. Normal effort    GI/Abdomen: BS +, non-tender, non-distended Ext: no clubbing, cyanosis, or edema Psych: pleasant and cooperative  Skin: Clean and intact without signs of breakdown Neuro:  Alert and oriented x 3. Normal insight and awareness. Intact Memory. Normal language and speech. Cranial nerve exam unremarkable. UE Motor 5/5. RLE 1+ prox to 2-3/5 ADF/PF. LLE 3- to 3/5 prox to 3/5 distally. T10 sensory level--stable appearance Musculoskeletal: right foot/ankle minimally tender    Assessment/Plan: 1. Functional deficits which require 3+ hours per day of interdisciplinary therapy in a comprehensive inpatient rehab  setting. Physiatrist is providing close team supervision and 24 hour management of active medical problems listed below. Physiatrist and rehab team continue to assess barriers to discharge/monitor patient progress toward functional and medical goals  Care Tool:  Bathing    Body parts bathed by patient: Right arm, Left arm, Chest, Abdomen, Front perineal area, Right upper leg, Left upper leg, Left lower leg, Face   Body parts bathed by helper: Buttocks, Right lower leg     Bathing assist Assist Level: Moderate Assistance - Patient 50 - 74%     Upper Body Dressing/Undressing Upper body dressing   What is the patient wearing?: Pull over shirt    Upper body assist Assist Level: Set up assist    Lower Body Dressing/Undressing Lower body dressing      What is the patient wearing?: Incontinence brief, Pants     Lower body assist Assist for lower body dressing: Maximal Assistance - Patient 25 - 49%     Toileting Toileting    Toileting assist Assist for toileting: Maximal Assistance - Patient 25 - 49%     Transfers Chair/bed transfer  Transfers assist     Chair/bed transfer assist level: Minimal Assistance - Patient > 75%     Locomotion Ambulation  Ambulation assist      Assist level: Minimal Assistance - Patient > 75% Assistive device: Walker-rolling Max distance: 30 ft   Walk 10 feet activity   Assist     Assist level: Minimal Assistance - Patient > 75% Assistive device: Walker-rolling   Walk 50 feet activity   Assist Walk 50 feet with 2 turns activity did not occur: Safety/medical concerns (fatigue/endurance)         Walk 150 feet activity   Assist Walk 150 feet activity did not occur: Safety/medical concerns         Walk 10 feet on uneven surface  activity   Assist Walk 10 feet on uneven surfaces activity did not occur: Safety/medical concerns (fatigue/balance)         Wheelchair     Assist Is the patient using a  wheelchair?: Yes Type of Wheelchair: Manual    Wheelchair assist level: Set up assist Max wheelchair distance: 250 ft    Wheelchair 50 feet with 2 turns activity    Assist        Assist Level: Independent   Wheelchair 150 feet activity     Assist      Assist Level: Independent   Blood pressure 131/82, pulse (!) 52, temperature 97.8 F (36.6 C), temperature source Oral, resp. rate 16, height 5\' 10"  (1.778 m), weight 82.9 kg, SpO2 100 %.  Medical Problem List and Plan: 1. 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              -pt is clinically a T10 level. 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  Antithrombotics: -DVT/anticoagulation:  Mechanical:  Antiembolism stockings, knee (TED hose) Bilateral lower extremities             -antiplatelet therapy: none 3. Pain Management: Tylenol as needed 4. Mood/Behavior/Sleep: LCSW to evaluate and provide emotional support             -antipsychotic agents: n/a 5. Neuropsych/cognition: This patient is capable of making decisions on his own behalf. 6. Skin/Wound Care: Routine skin care checks 7. Fluids/Electrolytes/Nutrition: Routine Is and Os and follow-up chemistries on admit   8. AKI on CKD: creatinine is improving/? at baseline             -BUN/Cr appear in range. Encourage appropriate PO -f/u labs Monday  9. Gout: acute flare 11/29--pain improved--             -continue colchicine 0.6 mg daily             -transitioned to prednisone 40 mg for 5 days from 12/1             -started on allopurinol  for prophylaxis   10. Hypertension             -continue Norvasc 10 mg daily             -continue Coreg 25 mg BID             -bp under reasonable control 12/2 11. Spasticity: continue Valium 2 mg q HS, Zanaflex 2 mg q HS              -these seem to be working  for him, minimal resting LE tone 12. GERD: continue Protonix   13. Hyperlipidemia: continue Crestor   14. LLL pneumonia: transition to Cefdinir for 3 days from 12/1             -IS/FV             -OOB 15. Anemia/chronic disease/dilutional: follow-up CBC   16. Leukocytosis: on steroids and getting abx for pneumonia; currently afebrile             -wbc's still 15k. Pt  afebrile 17. Neurogenic bladder:             -pt self-caths 3-4 x per day---a little more with IV running 18. Pt not on an established bowel program. He uses miralax daily and has a bm every 2-3 days. He says he's tried suppositories, dig stim, enemas, etc and they didn't work for him. He had an incontinent bm again this morning             -continue miralax per home regimen    LOS: 2 days A FACE TO FACE EVALUATION WAS PERFORMED  Meredith Staggers 08/31/2022, 11:20 AM

## 2022-08-31 NOTE — Progress Notes (Addendum)
Physical Therapy Session Note  Patient Details  Name: Maurice Cruz MRN: [id number] Date of Birth: [birth year]  Today's Date: 08/31/2022 PT Individual Time: 6378-5885; 0277-4128 PT Individual Time Calculation (min): 45 min , 75 min  Short Term Goals: Week 1:  PT Short Term Goal 1 (Week 1): Pt will perform sit to stand with CGA or better PT Short Term Goal 2 (Week 1): Pt will ambulate 100 ft with LRAD PT Short Term Goal 3 (Week 1): Pt will improve TUG score by MCID  Skilled Therapeutic Interventions/Progress Updates:  Tx 1:  Pt received sitting in wc.  He denied pain.  Pt on IV fluids; Mekedes, RN unhooked IV at start of session.  Seated Therapeutic exercises performed with LEs to increase strength for functional mobility. 10 x 1 each: L long arc quad knee extensions (focus on controlled descent) iwht 3 ankle pumps at end range, R knee flexion/extension with towel under foot to decrease friction; 10 x 2 bil hip adduction squeezes with upright trunk. PT instructed pt in counting aloud during exercises to avoid Valsalva.  In standing with bil UE support, 10 x 1 bil mini squats- pt had slower but effective R knee flexion/extesnion.  Pt donned shoes with set up, sitting in wc.     Wc propulsion throughout unit with distant supervision.  Cues for increased efficiency.  Sit> stand with CGa; bil LE /trunk spasticity begins upon standing and lasts about 30 seconds.  Gait training on level tile with RW x 110' with CGA, 2 turns. Stand> sit with CGA.    At conclusion of session, pt seated in wc with needs at hand.  Tx 2:  Pt received sitting in wc.  He denied pain. Pt reported feeling of swelling R foot; PT observed minimal edema R foot and LL.  Pt donned TEDS with mod assist to get over feet.  He managed shoes and socks independently.  Wc propulsion as above.  Sit> stand with close supervision/CG.   neuromuscular re-education via demo, equipment and assitance PRN in standing in parallel bars,  for RLE forward/backward progression while placed on scooter board, LLE on 2'' high stool.  Pt demonstrated good hip flexion/knee extension>knee flexion/hip extension .  With ACE wrap on RLL for slight foot drop, pt kicked 9 cones x 2 focusing on hip flexion and knee flexion to prepare for kicking. In L side lying iwht use of powder board, pt demonstrated ability to isolate R hip flexion/extenson with flexed knee,  and R isolated knee extesnion/flexion.  Gait training on level tile, RW and ACE RLL , x 180' with CGA to close supervision. Pt continues with limited R hip and knee flexion to advance RLE.  This appears to be due more to lack of sensation than weakness.  At conclusion of session, pt resting in bed.  He required mod assist to doff bil TEDS .  Pt left with needs at hand and bed alarm set.   Therapy Documentation Precautions:  Precautions Precautions: Fall Restrictions Weight Bearing Restrictions: No         Therapy/Group: Individual Therapy  Tammera Engert 08/31/2022, 12:15 PM

## 2022-08-31 NOTE — IPOC Note (Signed)
Overall Plan of Care [facility]) Patient Details Name: GILMAN OLAZABAL MRN: [id number] DOB: [birth year]  Admitting Diagnosis: [facility] Problems: Principal Problem:   Debility Active Problems:   Spinal cord disorder [facility])     Functional Problem List: Nursing Perception, Bladder, Bowel, Safety, Sensory, Skin Integrity, Endurance, Medication Management, Motor, Nutrition, Pain  PT Balance, Safety, Endurance, Motor, Skin Integrity, Sensory  OT Balance, Endurance, Motor, Sensory, Skin Integrity  SLP    TR         Basic ADL's: OT Bathing, Dressing, Toileting     Advanced  ADL's: OT Simple Meal Preparation     Transfers: PT Car, Floor, Furniture, Bed to Chair  OT Toilet, Tub/Shower     Locomotion: PT Ambulation, Stairs     Additional Impairments: OT None  SLP        TR      Anticipated Outcomes Item Anticipated Outcome  Self Feeding Independent  Swallowing      Basic self-care  Mod I  Toileting  Mod I   Bathroom Transfers Mod I  Bowel/Bladder  Continent with bowel/bladder program  Transfers  mod I with LRAD  Locomotion  mod I w/c, supervision gait  Communication     Cognition     Pain  less than 3  Safety/Judgment  remain fall free while in rehab   Therapy Plan: PT Intensity: Minimum of 1-2 x/day ,45 to 90 minutes PT Frequency: 5 out of 7 days PT Duration Estimated Length of Stay: 12-14 days OT Intensity: Minimum of 1-2 x/day, 45 to 90 minutes OT Frequency: 5 out of 7 days OT Duration/Estimated Length of Stay: 12-14 days     Team Interventions: Nursing Interventions Patient/Family Education, Pain Management, Bladder Management, Medication Management, Discharge Planning, Bowel Management, Skin Care/Wound Management, Psychosocial Support, Disease Management/Prevention  PT interventions DME/adaptive equipment instruction, Discharge planning, Ambulation/gait training, Functional mobility training, Splinting/orthotics, Psychosocial support, Pain  management, Therapeutic Activities, UE/LE Strength taining/ROM, Wheelchair propulsion/positioning, UE/LE Coordination activities, Therapeutic Exercise, Stair training, Skin care/wound management, Patient/family education, Neuromuscular re-education, Functional electrical stimulation, Disease management/prevention, Academic librarian, Training and development officer  OT Interventions Training and development officer, Discharge planning, Pain management, Functional electrical stimulation, Self Care/advanced ADL retraining, Therapeutic Activities, UE/LE Coordination activities, Disease mangement/prevention, Functional mobility training, Patient/family education, Skin care/wound managment, Therapeutic Exercise, Community reintegration, Engineer, drilling, Neuromuscular re-education, Psychosocial support, UE/LE Strength taining/ROM, Wheelchair propulsion/positioning  SLP Interventions    TR Interventions    SW/CM Interventions Discharge Planning, Psychosocial Support, Patient/Family Education   Barriers to Discharge MD  Medical stability  Nursing Decreased caregiver support, Home environment access/layout, Incontinence, Neurogenic Bowel & Bladder, Medication compliance Home with mother to multi level home 1 ste no rails. Has chair lift for 2nd and 3rd floors  PT Home environment access/layout, Neurogenic Bowel & Bladder    OT Neurogenic Bowel & Bladder    SLP      SW Decreased caregiver support, Lack of/limited family support, Insurance for SNF coverage, Neurogenic Bowel & Bladder, Incontinence     Team Discharge Planning: Destination: PT-Home ,OT- Home , SLP-  Projected Follow-up: PT-Home health PT, OT-  Home health OT, SLP-  Projected Equipment Needs: PT-Other (comment), OT- To be determined, SLP-  Equipment Details: PT-Pt owns required equipment, OT-  Patient/family involved in discharge planning: PT- Patient,  OT-Patient, SLP-   MD ELOS: 12-14 days Medical Rehab Prognosis:   Excellent Assessment: The patient has been admitted for CIR therapies with the diagnosis of 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 The team  will be addressing functional mobility, strength, stamina, balance, safety, adaptive techniques and equipment, self-care, bowel and bladder mgt, patient and caregiver education, NMR, reassess bowel and bladder mgt, pain control, community reentry. Goals have been set at mod I for mobility and self-care at w/c level. Some gait goals. Anticipated discharge destination is home.        See Team Conference Notes for weekly updates to the plan of care

## 2022-08-31 NOTE — Progress Notes (Signed)
Occupational Therapy Session Note  Patient Details  Name: Maurice Cruz MRN: [id number] Date of Birth: [birth year]  Today's Date: 08/31/2022 OT Individual Time: 5573-2202 OT Individual Minutes: 58 and 40 Total Individual Time (min): 130   Short Term Goals: Week 1:  OT Short Term Goal 1 (Week 1): Pt will complete 2/3 toileting steps with supervision OT Short Term Goal 2 (Week 1): Pt will complete squat pivot toilet transfer with CGA to [facility] OT Short Term Goal 3 (Week 1): Pt will demonstrate recall of modified strategies to thread BLE into LB clothing with supervision  Skilled Therapeutic Interventions/Progress Updates:   1st session 8:15 - 945: Patient lying bed with HOB elevated approx 30 degrees and was agreeable to flatten bed similar to home and to continue participating on skilled OT session.   Focus this session as follows:  Supine to sit = Mod I  EOB to w/c transfer via modified squat/scoot to his right = distant S  UB bathing and dressing in wheel chair at sink = setup (nurse managed IV dis/connection in order for pt. To doff/don short sleeved shirt)  Grooming sinkset = mod I seated  Self cathed seated in chair with supplies placed on EOB  Toilet transfers via w/c and grab bar stand 2 step pivot= close Supervision  Toileting (sit to stand via grab bar on left for stabilization to intitially stand and later was able to let go to use bilateral UEs and balance for short periods) = Min A for thorough cleansing after BM with warm cloth as patient requested after he cleansed with wipes  At end of session, patient was left seated in w/c beside bed with safety belt fastened and call bell within reach  2nd session 1300-1340:  Patient participated as follows: Patient problem solved and explained toileting set up at home and stated he will need a tub transfer bench and shower hose.   - He completed side stepping with following walker for toilet transfer set up similar to home (doorway  not wide enough to enter straight on): toilet transfer = close Supervision with side steps  -toileting (via grab bar on left for stabilization to intitially stand and later was able to let go to use bilateral UEs and balance for short periods) = Min A for thorough cleansing after BM with warm cloth as patient requested after he cleansed with wipes   -dynamic standing balance at sink to complete therapeutic activities and self care = Supervision for approximately 10 seconds per try and then supported self with one hand on sink due to fatigue, required close S  At end of session, patient was left seated in w/c beside bed with safety belt fastened and call bell within reach.  Continue patient's OT Plan of Care     Therapy Documentation Precautions:  Precautions Precautions: Fall Restrictions Weight Bearing Restrictions: No  Pain:denied      Therapy/Group: Individual Therapy  Alfredia Ferguson [facility] 08/31/2022, 9:17 AM

## 2022-09-01 LAB — CULTURE, BLOOD (ROUTINE X 2)
Culture: NO GROWTH
Culture: NO GROWTH
Special Requests: ADEQUATE
Special Requests: ADEQUATE

## 2022-09-01 NOTE — Progress Notes (Signed)
Rounding maintained Q 2 hours. PO fluids encouraged. Pt did self cath over night with positive return about 2320. Please see flowchart. No new complaints overnight. Safety maintained and needs addressed.

## 2022-09-02 DIAGNOSIS — N179 Acute kidney failure, unspecified: Secondary | ICD-10-CM | POA: Diagnosis not present

## 2022-09-02 DIAGNOSIS — J189 Pneumonia, unspecified organism: Secondary | ICD-10-CM

## 2022-09-02 DIAGNOSIS — R5381 Other malaise: Secondary | ICD-10-CM | POA: Diagnosis not present

## 2022-09-02 DIAGNOSIS — G959 Disease of spinal cord, unspecified: Secondary | ICD-10-CM | POA: Diagnosis not present

## 2022-09-02 DIAGNOSIS — D72829 Elevated white blood cell count, unspecified: Secondary | ICD-10-CM | POA: Diagnosis not present

## 2022-09-02 DIAGNOSIS — D631 Anemia in chronic kidney disease: Secondary | ICD-10-CM

## 2022-09-02 DIAGNOSIS — N189 Chronic kidney disease, unspecified: Secondary | ICD-10-CM

## 2022-09-02 LAB — CBC
HCT: 29.9 % — ABNORMAL LOW (ref 39.0–52.0)
Hemoglobin: 10.2 g/dL — ABNORMAL LOW (ref 13.0–17.0)
MCH: 27.6 pg (ref 26.0–34.0)
MCHC: 34.1 g/dL (ref 30.0–36.0)
MCV: 80.8 fL (ref 80.0–100.0)
Platelets: 434 10*3/uL — ABNORMAL HIGH (ref 150–400)
RBC: 3.7 MIL/uL — ABNORMAL LOW (ref 4.22–5.81)
RDW: 13.5 % (ref 11.5–15.5)
WBC: 11.2 10*3/uL — ABNORMAL HIGH (ref 4.0–10.5)
nRBC: 0 % (ref 0.0–0.2)

## 2022-09-02 LAB — BASIC METABOLIC PANEL
Anion gap: 10 (ref 5–15)
BUN: 34 mg/dL — ABNORMAL HIGH (ref 6–20)
CO2: 20 mmol/L — ABNORMAL LOW (ref 22–32)
Calcium: 8.9 mg/dL (ref 8.9–10.3)
Chloride: 108 mmol/L (ref 98–111)
Creatinine, Ser: 1.7 mg/dL — ABNORMAL HIGH (ref 0.61–1.24)
GFR, Estimated: 47 mL/min — ABNORMAL LOW (ref >60)
Glucose, Bld: 125 mg/dL — ABNORMAL HIGH (ref 70–99)
Potassium: 4.1 mmol/L (ref 3.5–5.1)
Sodium: 138 mmol/L (ref 135–145)

## 2022-09-02 MED ORDER — GUAIFENESIN ER 600 MG PO TB12
600.0000 mg | ORAL_TABLET | Freq: Two times a day (BID) | ORAL | Status: DC
  Administered 2022-09-02 – 2022-09-10 (×17): 600 mg via ORAL
  Filled 2022-09-02 (×17): qty 1

## 2022-09-02 NOTE — Progress Notes (Signed)
Occupational Therapy Session Note  Patient Details  Name: Maurice Cruz MRN: [id number] Date of Birth: 11/01/1965  Today's Date: 09/02/2022 OT Individual Time: 0930-1015 OT Individual Time Calculation (min): 45 min    Short Term Goals: Week 1:  OT Short Term Goal 1 (Week 1): Pt will complete 2/3 toileting steps with supervision OT Short Term Goal 2 (Week 1): Pt will complete squat pivot toilet transfer with CGA to [facility] OT Short Term Goal 3 (Week 1): Pt will demonstrate recall of modified strategies to thread BLE into LB clothing with supervision   Skilled Therapeutic Interventions/Progress Updates:    1:1 Pt received in the w/c. Self care retraining at shower level. Pt able to ambulated into the bathroom with min A with RW and transition into the shower (tub bench) with min A. Pt able to doff clothing; including LB clothing crossing his LEs or figure four to reach his feet. Pt able to bathe with contact guard to supervision with lateral leans for washing buttocks. Dried and ambulated out to the w/c at the sink with RW with min A. Pt able to dress sit to stand with contact guard with ability to demonstrate proper placement of hands and feet prior to sit to stands. Pt left sitting at the sink to perform grooming at the sink.    Pt was able to cath self into urinal - recorded in I&Os and emptied urinal.  Therapy Documentation Precautions:  Precautions Precautions: Fall Restrictions Weight Bearing Restrictions: Yes  Pain: Pain Assessment Pain Scale: 0-10 Pain Score: 0-No pain    Therapy/Group: Individual Therapy  Willeen Cass [facility] 09/02/2022, 10:51 AM

## 2022-09-02 NOTE — Progress Notes (Signed)
Physical Therapy Session Note  Patient Details  Name: Maurice Cruz MRN: [id number] Date of Birth: 01-31-1966  Today's Date: 09/02/2022 PT Individual Time: 0800-0900, 1300-1420 PT Individual Time Calculation (min): 60 min, 80 min   Short Term Goals: Week 1:  PT Short Term Goal 1 (Week 1): Pt will perform sit to stand with CGA or better PT Short Term Goal 2 (Week 1): Pt will ambulate 100 ft with LRAD PT Short Term Goal 3 (Week 1): Pt will improve TUG score by MCID  Skilled Therapeutic Interventions/Progress Updates:    Session 1: pt received in bed and agreeable to therapy. No complaint of pain. Bed mobility with supervision and bed features. Pt donned ted hose, pants and shoes with set up, CGA only while standing to pull up pants. Pt stood at sink ~ 5 minutes while brushing teeth and washing face for balance and standing tolerance. Pt propelled w/c with BUE throughout session with supervision. Squat pivot to mat table with supervision. Supine<>sit and rolling with supervision on mat table. Pt participated in powderboard activities for improved coordination and strength of RLE. Pt performed knee and hip flexion in isolation before performing simulated swing gait phase on board. Therapist assisted with tapping and manual facilitation for timing and placement. Provided passive hip flexor stretch during rest breaks. Progressed to gait with RW and CGA x 90 and x 150 back to room. Pt able nearly clear foot at times but continues to demo toe catch and intermittent circumduction with fatigue. Pt did well with tapping facilitation at hip flexors. Pt remained in w/c at end of session and was left with all needs in reach.   Session 2: Pt seated in w/c on arrival and agreeable to therapy. No complaint of pain. Pt propelled w/c with BUE with supervision throughout session for endurance and functional mobility. Pt performed NMR for gait preparation and improved hamstring and hip flexor activation pt with UUE  support when stepping LLE over hockey stick when stepping over with RLE. Added 1.5 ankle weight to RLE for improved proprioception with good results. Pt able to clear BLE, but with ataxic movement patter, R>L. Pt performed 3 bouts with seated rest break for fatigue. Pt then ambulated 3 x 170 ft with RW and CGA with Rtb hip and knee flexion assist wrap. Pt demoes improved gait mechanics with slight assist from band and slower gait speed. Multimodal cues and tapping facilitation at hip flexors throughout. Pt able to clear floor with fewer compensations intermittently but fatigues quickly. Pt returned to room and doffed shoes with supervision. Sit>supine with distant supervision, pt was left with all needs in reach and alarm active.      Therapy Documentation Precautions:  Precautions Precautions: Fall Restrictions Weight Bearing Restrictions: Yes General:       Therapy/Group: Individual Therapy  Mickel Fuchs 09/02/2022, 12:43 PM

## 2022-09-02 NOTE — Progress Notes (Addendum)
PROGRESS NOTE   Subjective/Complaints: Reports he continues to have some cough related to prior pneumonia, it is  gradually improving.   ROS: Patient denies fever, rash, sore throat, blurred vision, dizziness, nausea, vomiting, diarrhea, cough, shortness of breath or chest pain, joint or back/neck pain, headache, or mood change.    Objective:   No results found. Recent Labs    09/02/22 0757  WBC 11.2*  HGB 10.2*  HCT 29.9*  PLT 434*    Recent Labs    09/02/22 0757  NA 138  K 4.1  CL 108  CO2 20*  GLUCOSE 125*  BUN 34*  CREATININE 1.70*  CALCIUM 8.9     Intake/Output Summary (Last 24 hours) at 09/02/2022 1210 Last data filed at 09/02/2022 1000 Gross per 24 hour  Intake 240 ml  Output 2400 ml  Net -2160 ml         Physical Exam: Vital Signs Blood pressure (!) 134/91, pulse (!) 53, temperature 98 F (36.7 C), temperature source Oral, resp. rate 18, height 5\' 10"  (1.778 m), weight 82.9 kg, SpO2 99 %.  Constitutional: No distress . Vital signs reviewed. HEENT: NCAT, EOMI, oral membranes moist Neck: supple Cardiovascular: RRR without murmur. No JVD    Respiratory/Chest: CTA Bilaterally without wheezes or rales. Normal effort  , non-labored and no increased WOB GI/Abdomen: BS normoactive, non-tender, non-distended Ext: no clubbing, cyanosis, or edema Psych: pleasant and cooperative  Skin: Clean and intact without signs of breakdown Neuro:  Alert and oriented x 3. Normal insight and awareness. Intact Memory. Normal language and speech. Cranial nerve exam unremarkable. UE Motor 5/5. RLE 1+ prox to 2-3/5 ADF/PF. LLE 3- to 3/5 prox to 3/5 distally. T10 sensory level--stable appearance Musculoskeletal: right foot/ankle minimally tender    Assessment/Plan: 1. Functional deficits which require 3+ hours per day of interdisciplinary therapy in a comprehensive inpatient rehab setting. Physiatrist is providing  close team supervision and 24 hour management of active medical problems listed below. Physiatrist and rehab team continue to assess barriers to discharge/monitor patient progress toward functional and medical goals  Care Tool:  Bathing    Body parts bathed by patient: Right arm, Left arm, Chest, Abdomen, Front perineal area, Right upper leg, Left upper leg, Left lower leg, Face, Buttocks, Right lower leg   Body parts bathed by helper: Buttocks, Right lower leg     Bathing assist Assist Level: Contact Guard/Touching assist     Upper Body Dressing/Undressing Upper body dressing   What is the patient wearing?: Pull over shirt    Upper body assist Assist Level: Independent    Lower Body Dressing/Undressing Lower body dressing      What is the patient wearing?: Incontinence brief, Pants     Lower body assist Assist for lower body dressing: Contact Guard/Touching assist     Toileting Toileting    Toileting assist Assist for toileting: Independent with assistive device     Transfers Chair/bed transfer  Transfers assist     Chair/bed transfer assist level: Contact Guard/Touching assist     Locomotion Ambulation   Ambulation assist   Ambulation activity did not occur:  (also ACE for R mild foot drop)  Assist level: Contact Guard/Touching assist Assistive device: Other (comment) Max distance: 180   Walk 10 feet activity   Assist     Assist level: Contact Guard/Touching assist Assistive device: Walker-rolling   Walk 50 feet activity   Assist Walk 50 feet with 2 turns activity did not occur: Safety/medical concerns (fatigue/endurance)  Assist level: Contact Guard/Touching assist Assistive device: Walker-rolling    Walk 150 feet activity   Assist Walk 150 feet activity did not occur: Safety/medical concerns  Assist level: Contact Guard/Touching assist Assistive device: Walker-rolling    Walk 10 feet on uneven surface  activity   Assist Walk  10 feet on uneven surfaces activity did not occur: Safety/medical concerns (fatigue/balance)         Wheelchair     Assist Is the patient using a wheelchair?: Yes Type of Wheelchair: Manual    Wheelchair assist level: Set up assist Max wheelchair distance: 250 ft    Wheelchair 50 feet with 2 turns activity    Assist        Assist Level: Independent   Wheelchair 150 feet activity     Assist      Assist Level: Independent   Blood pressure (!) 134/91, pulse (!) 53, temperature 98 F (36.7 C), temperature source Oral, resp. rate 18, height 5\' 10"  (1.778 m), weight 82.9 kg, SpO2 99 %.  Medical Problem List and Plan: 1. 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              -pt is clinically a T10 level. 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  Antithrombotics: -DVT/anticoagulation:  Mechanical:  Antiembolism stockings, knee (TED hose) Bilateral lower extremities             -antiplatelet therapy: none 3. Pain Management: Tylenol as needed 4. Mood/Behavior/Sleep: LCSW to evaluate and provide emotional support             -antipsychotic agents: n/a 5. Neuropsych/cognition: This patient is capable of making decisions on his own behalf. 6. Skin/Wound Care: Routine skin care checks 7. Fluids/Electrolytes/Nutrition: Routine Is and Os and follow-up chemistries on admit   8. AKI on CKD: creatinine is improving/? at baseline             -BUN/Cr appear in range. Encourage appropriate PO -12/4 Cr 1.70 Bun 34, appears stable overall,  continue to monitor 9. Gout: acute flare 11/29--pain improved--             -continue colchicine 0.6 mg daily             -transitioned to prednisone 40 mg for 5 days from 12/1             -started on allopurinol  for prophylaxis   10.  Hypertension             -continue Norvasc 10 mg daily             -continue Coreg 25 mg BID             -11/4 well controlled overall, continue current regimen      09/02/2022    5:10 AM 09/01/2022    7:43 PM 09/01/2022    1:00 PM  Vitals with BMI  Systolic [phone]  Diastolic 91 74 73  Pulse 53 58 61    11. Spasticity: continue Valium 2 mg q HS, Zanaflex 2 mg q HS             -these seem to be working for him, minimal resting LE tone 12. GERD: continue Protonix   13. Hyperlipidemia: continue Crestor   14. LLL pneumonia: transition to Cefdinir for 3 days from 12/1             -IS/FV             -OOB  -12/4 Will schedule mucinex 600mg  12hr BID for cough 15. Anemia/chronic disease/dilutional: follow-up CBC  -12/4 improved HGB to 10.2   16. Leukocytosis: on steroids and getting abx for pneumonia; currently afebrile             -wbc's still 15k. Pt  afebrile  -12/ 4 WBC improved to 11.2 17. Neurogenic bladder:             -pt self-caths 3-4 x per day---a little more with IV running 18. Pt not on an established bowel program. He uses miralax daily and has a bm every 2-3 days. He says he's tried suppositories, dig stim, enemas, etc and they didn't work for him. He had an incontinent bm again this morning             -continue miralax per home regimen    LOS: 4 days A FACE TO FACE EVALUATION WAS PERFORMED  Jennye Boroughs 09/02/2022, 12:10 PM

## 2022-09-02 NOTE — Progress Notes (Signed)
Inpatient Rehabilitation  Patient information reviewed and entered into eRehab system by Deosha Werden M. Sennie Borden, M.A., CCC/SLP, PPS Coordinator.  Information including medical coding, functional ability and quality indicators will be reviewed and updated through discharge.    

## 2022-09-02 NOTE — Discharge Summary (Signed)
Physician Discharge Summary  Patient ID: [name] MRN: [id number] DOB/AGE: Feb 19, 1966 56 y.o.  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 male with a history of spinal cord hematoma in 2022. Initially diagnosed while living in [place] in 2020 with BLE weakness. 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 He presented to [facility] ED on 08/25/2022 complaining of several history of BLE weakness, left greater than right. NS and neurology consulted. MRI with chronic changes. Neurology confirmed no TM diagnosis  and MRI of complete spine negative for cord compression and stable cord hemorrhage as compared to July 2022. The patient is followed as outpatient with Dr. Epimenio Foot. AKI atop CKD noted. Lab work-up, LE venous duplex performed. Complained of right ankle pain on 11/27 and started on colchicine for acute gout flare. Also complained of lingering cough and had mild fever to 100.6. Chest x-ray and blood cultures performed. Possible LLL pneumonia and started on Rocephin and Zithromax on 11/29. Switching to  Ceftin for 3 more days today, 11/30. Tolerating HH diet.     Hospital Course: Maurice Cruz was admitted to rehab 08/29/2022 for inpatient therapies to consist of PT, ST and OT at least three hours five days a week. Past admission physiatrist, therapy team and rehab RN have worked together to provide customized collaborative inpatient rehab. Acute gout flare improved. On short burst prednisone and completed 12/05. BUN and creatinine improved on 12/01. Continued Miralax per home program and self-cath 3-4 times daily. Follow-up labs on 12/04 with slight increase in BUN and creatinine, improvement in WBC and H and H. Neuropsychology consultation on 12/06. Non-productive cough without fever or SOB. Improved with guaifenesin.   Blood pressures were monitored on TID basis and remained stable on Norvasc 10 mg daily and coreg 25 mg BID.  Rehab course: During patient's stay in rehab weekly team conferences were held to monitor patient's progress, set goals and discuss barriers to discharge. At admission, patient required mod assist with basic self-care skills and mobility.  He has had improvement in activity tolerance, balance, postural control as well as ability to compensate for deficits. He has had improvement in functional use RUE/LUE  and RLE/LLE as well as improvement in awareness. PT able to perform squat pivots mod I. Discussed showering with distant supervision with lateral leans, encouragement of cathing schedule to avoid bladder overflow and what a neurogentic bladder means.  He demonstrates gait at [facility] level and transfers at supervision level. Pt is competent and able to direct his care. Pt's gait is progressing  well, he is able to ambulate with fewer compensations and improved mechanics. Pt agreeable to using toe cap to allow for improved mechanics on carpet and outdoor surfaces, and allow continues progress toward full foot clearance by utilizing appropriate muscles instead of compensation patterns.      Disposition: Home Discharge disposition: 01-Home or Self Care     Diet: heart healthy  Special Instructions: No driving, alcohol consumption or tobacco use.  30-35 minutes were spent on discharge planning and discharge summary. Discharge Instructions     Ambulatory referral to Physical Medicine Rehab   Complete by: As directed    Hospital follow-up   Discharge patient   Complete by: As directed    Discharge disposition: 01-Home or Self Care   Discharge patient date: 09/10/2022      Allergies as of 09/10/2022   No Known Allergies      Medication List     STOP taking these medications    cefUROXime 500 MG tablet Commonly known as: CEFTIN   colchicine 0.6 MG tablet   Magnesium 400 MG Tabs   predniSONE 20 MG tablet Commonly known as: DELTASONE   traMADol 50 MG tablet Commonly known as: ULTRAM       TAKE these medications    acetaminophen 325 MG tablet Commonly known as: TYLENOL Take 1-2 tablets (325-650 mg total) by mouth every 4 (four) hours as needed for mild pain.   allopurinol 100 MG tablet Commonly known as: Zyloprim Take 0.5 tablets (50 mg total) by mouth daily. What changed: how much to take   amLODipine 10 MG tablet Commonly known as: NORVASC Take 10 mg by mouth daily.   CALCIUM POLYCARBOPHIL PO Mix 30ml into 8 oz of a beverage and drink daily   carvedilol 25 MG tablet Commonly known as: COREG Take 25 mg by mouth 2 (two) times daily.   CENTRUM MEN PO Take 1 tablet by mouth daily.   diazepam 2 MG tablet Commonly known as: VALIUM Take 1 tablet (2 mg total) by mouth at bedtime.   guaiFENesin 600 MG 12 hr tablet Commonly known as: MUCINEX Take 1 tablet (600 mg total) by mouth 2 (two) times daily.   guaiFENesin-dextromethorphan 100-10 MG/5ML syrup Commonly known as: ROBITUSSIN DM Take 5-10 mLs by mouth every 6 (six) hours as needed for cough.   pantoprazole 40 MG tablet Commonly known as: PROTONIX Take 40 mg by mouth  daily.   polyethylene glycol 17 g packet Commonly known as: MIRALAX / GLYCOLAX Take 17 g by mouth daily.   rosuvastatin 5 MG tablet Commonly known as: CRESTOR Take 5 mg by mouth daily.   sildenafil 100 MG tablet Commonly known as: VIAGRA Take 100 mg by mouth See admin instructions. Take 100 mg every 3 days as needed for erectile dysfunction   tiZANidine 4 MG tablet Commonly known as: ZANAFLEX Take 0.5 tablets (2 mg total) by mouth at bedtime.        Follow-up Information     Center, [facility] Follow up.   Why: Call in 1-2 days to make arrangments for hospital follow-up appointment. Contact information: [address] [place] [address] [phone]         Genice Rouge, MD Follow up.   Specialty: Physical Medicine and Rehabilitation Why: office will call you to arrange your appt (sent) Contact information: [address] [place] [address] [phone]         Asa Lente, MD Follow up.   Specialty:  Neurology Why: Call in 1-2 days to make arrangments for hospital follow-up appointment. Contact information: [address] [place] [address] [phone]                 Signed: Milinda Antis 09/10/2022, 11:08 AM

## 2022-09-02 NOTE — Care Management (Signed)
Inpatient [facility] Individual Statement of Services  Patient Name:  Maurice Cruz  Date:  09/02/2022  Welcome to the [facility].  Our goal is to provide you with an individualized program based on your diagnosis and situation, designed to meet your specific needs.  With this comprehensive rehabilitation program, you will be expected to participate in at least 3 hours of rehabilitation therapies Monday-Friday, with modified therapy programming on the weekends.  Your rehabilitation program will include the following services:  Physical Therapy (PT), Occupational Therapy (OT), 24 hour per day rehabilitation nursing, Therapeutic Recreaction (TR), Psychology, Neuropsychology, Care Coordinator, Rehabilitation Medicine, [place], and Other  Weekly team conferences will be held on Tuesdays to discuss your progress.  Your Inpatient Rehabilitation Care Coordinator will talk with you frequently to get your input and to update you on team discussions.  Team conferences with you and your family in attendance may also be held.  Expected length of stay: Independent with Assistive Device   Overall anticipated outcome: 12-14 days  Depending on your progress and recovery, your program may change. Your Inpatient Rehabilitation Care Coordinator will coordinate services and will keep you informed of any changes. Your Inpatient Rehabilitation Care Coordinator's name and contact numbers are listed  below.  The following services may also be recommended but are not provided by the [facility] will be made to provide these services after discharge if needed.  Arrangements include referral to agencies that provide these services.  Your insurance has been verified to be:  [employer]  Your primary doctor is:   [facility]  Pertinent information will be shared with your doctor and your insurance company.  Inpatient Rehabilitation Care Coordinator:  Cathleen Corti [phone] or ([phone]  Information discussed with and copy given to patient by: Rana Snare, 09/02/2022, 10:44 AM

## 2022-09-02 NOTE — Progress Notes (Addendum)
Patient ID: VIRGIE CHERY, male   DOB: 10-21-1965, 55 y.o.   MRN: [id number]  SW left message for pt mother Knox Royalty [phone]) to inform on ELOS, and will follow--up with updates from team conference and d/c date.  SW faxed catheter form to [facility] (p:[phone]/[phone]) for 1fr speedicath.   *SW returned phone call to pt mother Corin and left a message.  Loralee Pacas, MSW, [place] Office: [phone] Cell: [phone] Fax: [phone]

## 2022-09-03 NOTE — Progress Notes (Signed)
Occupational Therapy Session Note  Patient Details  Name: Maurice Cruz MRN: [id number] Date of Birth: 1966-09-01  Today's Date: 09/03/2022 OT Individual Time: 1300-1400 OT Individual Time Calculation (min): 60 min    Short Term Goals: Week 1:  OT Short Term Goal 1 (Week 1): Pt will complete 2/3 toileting steps with supervision OT Short Term Goal 2 (Week 1): Pt will complete squat pivot toilet transfer with CGA to [facility] OT Short Term Goal 3 (Week 1): Pt will demonstrate recall of modified strategies to thread BLE into LB clothing with supervision  Skilled Therapeutic Interventions/Progress Updates:    1:1. Pt receive in w/c with parents present agreeable to OT with all DAL needs met. Pain in L wrist relieved by heat pack+ace wrap- pain with ulnar deviation, WB and wrist flexion Pt completes all mobility with supervision w/c propulsion or functional ambulation with RW (with ankle weights 1.5# back to room) for activity tolerance. Balance and LE coordination tapping 1LE In standing with RW 2x4 each side in flor clock figure. Functional mobility over dowl rod with RW with CGA with pt using compensatory strategies/hip hike to clear RLE. Seated "kick ball" with ankle weights 2x15 each LE which pt reported really enjoying. Exited session with pt seated in EOB with NT taking vitals, exit alarm on and call light in reach   Therapy Documentation Precautions:  Precautions Precautions: Fall Restrictions Weight Bearing Restrictions: Yes General:     Therapy/Group: Individual Therapy  Tonny Branch 09/03/2022, 2:09 PM

## 2022-09-03 NOTE — Patient Care Conference (Signed)
Inpatient RehabilitationTeam Conference and Plan of Care Update Date: 09/03/2022   Time: 11:13 AM    Patient Name: Maurice Cruz      Medical Record Number: [id number]  Date of Birth: 1966-01-14 Sex: Male         Room/Bed: 4W10C/4W10C-01 Payor Info: Payor: [place] / Plan: BCBS COMM PPO / Product Type: *No Product type* /    Admit Date/Time:  08/29/2022  2:25 PM  Primary Diagnosis:  [facility] Problems: Principal Problem:   Debility Active Problems:   Spinal cord disorder [facility])    Expected Discharge Date: Expected Discharge Date: 09/10/22  Team Members Present: Physician leading conference: Dr. Jennye Boroughs Social Worker Present: Loralee Pacas, [place] Nurse Present: Tacy Learn, RN PT Present: Ailene Rud, PT OT Present: Roanna Epley, Erling Cruz, OT PPS Coordinator present : Gunnar Fusi, SLP     Current Status/Progress Goal Weekly Team Focus  Bowel/Bladder    Neurogenic B/B   Continent with B/B  I/O prior to bladder being full for continence    Swallow/Nutrition/ Hydration               ADL's   CGA/min A overall   mod I overall   BADLs, activity tolerance, saety awarness, educaiton, functional transfers    Mobility   CGA with standing mobility, spasticity and tone improving some, gait up to 150 ft   mod I transfers/w/c level, supervision gait, min A floor transfer  gait mechanics, NMR, balance    Communication                Safety/Cognition/ Behavioral Observations               Pain    Denies pain   Pain free  Assess every 4 hours and PRN    Skin     Skin is CDI  Skin remain free of infection/breakdown  Assess every shift and PRN      Discharge Planning:  Pt reports he will d/c to home with his parents. SW will confirm there are no barriers to discharge.   Team Discussion: Debility. Neurogenic bladder and bowel. Incontinent at times. Cath's himself and does his own bowel program. PRN pain  medications if needed. Skin is CDI. Mucinex added for cough. B/P now controlled. Kidneys at baseline. Home with parents. CG with therapy. Spasticity and tone are improved.  Patient on target to meet rehab goals: yes, ambulating 150 feet  *See Care Plan and progress notes for long and short-term goals.   Revisions to Treatment Plan:  Medication adjustments, monitor labs, trends Teaching Needs: Medications, safety, gait/transfer training, etc.   Current Barriers to Discharge: Decreased caregiver support and Neurogenic bowel and bladder  Possible Resolutions to Barriers: Family education, nursing education, order recommended DME     Medical Summary Current Status: debilitiy, AKI, HTN, pneumonia, neurogenic bowel and bladder  Barriers to Discharge: Neurogenic Bowel & Bladder;Medical stability;Self-care education  Barriers to Discharge Comments: debilitiy, AKI, HTN, pneumonia, neurogenic bowel and bladder Possible Resolutions to Barriers/Weekly Focus: mucinex started for cough, monitor bowel and bladder function, monitor BMET   Continued Need for Acute Rehabilitation Level of Care: The patient requires daily medical management by a physician with specialized training in physical medicine and rehabilitation for the following reasons: Direction of a multidisciplinary physical rehabilitation program to maximize functional independence : Yes Medical management of patient stability for increased activity during participation in an intensive rehabilitation regime.: Yes Analysis of laboratory values and/or radiology  reports with any subsequent need for medication adjustment and/or medical intervention. : Yes   I attest that I was present, lead the team conference, and concur with the assessment and plan of the team.   Ernest Pine 09/03/2022, 2:47 PM

## 2022-09-03 NOTE — Progress Notes (Signed)
Physical Therapy Session Note  Patient Details  Name: Maurice Cruz MRN: [id number] Date of Birth: 05-02-1966  Today's Date: 09/03/2022 PT Individual Time: 0830-0930, 1130-1200 PT Individual Time Calculation (min): 60 min, 30 min   Short Term Goals: Week 1:  PT Short Term Goal 1 (Week 1): Pt will perform sit to stand with CGA or better PT Short Term Goal 2 (Week 1): Pt will ambulate 100 ft with LRAD PT Short Term Goal 3 (Week 1): Pt will improve TUG score by MCID  Skilled Therapeutic Interventions/Progress Updates:    Session 1: pt received in bed and agreeable to therapy. No complaint of pain. Pt found to have donned one ted hose, but could not reach other one. Pt donned teds, pants, and shoes with set up. CGA only while standing to pull pants over hips. ambulatory transfer to w/c with CGA and RW. Session focused on floor transfer. Pt was instructed on and performed floor transfer x 6 from low mat, bed level height mat, and into w/c. Instructional cueing and encouragement throughout, pt able to perform with CGA. Discussed activating EMS in case of injury and requesting supervision from family members in case of fall. Also discussed importance of practice to allow for performance under the strong emotions of a fall. Pt expressed understanding. Pt returned to room after session and remained in w/c with all needs in reach.   Session 2: Pt seated in w/c on arrival and agreeable to therapy. Pt reports new onset of L wrist pain that started previous day and worsened since earlier session. Pain noted at ulnar side of wrist, with pain in passive extension and active extension and ulnar deviation. Pt requested ace wrap, discussed that this likely won't fix source of pain, but compression and light support can alleviate some symptoms. Recommended pt rest it today. Pt performed squat pivot transfers with supervision. Supine<>sit with supervision. Pt performed bridging for improved hip stability in standing.  Added bolster for adduction squeeze to increase difficulty. Pt reports slight back pain, cued for posterior pelvic tilt which pt reports alleviates back discomfort. Pt instructed in pelvic clock exercise and instructed to perform while in bed for improved pelvic floor and lower abdominal control. Pt returned to room after session and was left with all needs in reach and alarm active.   Therapy Documentation Precautions:  Precautions Precautions: Fall Restrictions Weight Bearing Restrictions: Yes General:       Therapy/Group: Individual Therapy  Mickel Fuchs 09/03/2022, 12:21 PM

## 2022-09-03 NOTE — Progress Notes (Signed)
Patient ID: Maurice Cruz, male   DOB: [birth year], 56 y.o.   MRN: [id number]  SW met with pt and pt parents in room to provide updates from team conference, and d/c date 12/12. Fam edu scheduled for Friday (12/8) 1pm-4pm. SW will bring pt independent living resource for home modifications. Pt intends to purchase TTB from [employer]. Catheters arrived at home yesterday, and he is going to try a few catheters to determine if he likes them.   Loralee Pacas, MSW, [place] Office: [phone] Cell: [phone] Fax: [phone]

## 2022-09-03 NOTE — Progress Notes (Signed)
Occupational Therapy Session Note  Patient Details  Name: Maurice Cruz MRN: [id number] Date of Birth: 1966/06/06  Today's Date: 09/03/2022 OT Individual Time: 8502-7741 OT Individual Time Calculation (min): 59 min    Short Term Goals: Week 1:  OT Short Term Goal 1 (Week 1): Pt will complete 2/3 toileting steps with supervision OT Short Term Goal 2 (Week 1): Pt will complete squat pivot toilet transfer with CGA to [facility] OT Short Term Goal 3 (Week 1): Pt will demonstrate recall of modified strategies to thread BLE into LB clothing with supervision  Skilled Therapeutic Interventions/Progress Updates:    OT intervention with focus on functional amb with RW and BADLs to increase independence with BADLs. Pt amb with RW to bathroom with CGA. Bathing at shower level with CGA. Pt completed dressing with sit<>stand from seat with CGA for standing balance. Pt able to don Ted hose and shoes without assistance. Discussed DME recommendation for shower. Pt provided info on TTB. Pt remained in w/c with all needs within reach.   Therapy Documentation Precautions:  Precautions Precautions: Fall Restrictions Weight Bearing Restrictions: Yes  Pain: Pain Assessment Pain Scale: 0-10 Pain Score: 0-No pain  Therapy/Group: Individual Therapy  Leroy Libman 09/03/2022, 10:30 AM

## 2022-09-03 NOTE — Progress Notes (Signed)
PROGRESS NOTE   Subjective/Complaints: Continues to have occasional cough, it improved with guaifenesin. No additional concerns.   ROS: Patient denies fever, rash, sore throat, blurred vision, dizziness, nausea, vomiting, diarrhea, shortness of breath or chest pain, joint or back/neck pain, headache, or mood change.  +cough   Objective:   No results found. Recent Labs    09/02/22 0757  WBC 11.2*  HGB 10.2*  HCT 29.9*  PLT 434*    Recent Labs    09/02/22 0757  NA 138  K 4.1  CL 108  CO2 20*  GLUCOSE 125*  BUN 34*  CREATININE 1.70*  CALCIUM 8.9     Intake/Output Summary (Last 24 hours) at 09/03/2022 0819 Last data filed at 09/03/2022 0717 Gross per 24 hour  Intake 720 ml  Output 2550 ml  Net -1830 ml         Physical Exam: Vital Signs Blood pressure 123/76, pulse (!) 53, temperature 98.2 F (36.8 C), resp. rate 18, height _0  (1.778 m), weight 82.9 kg, SpO2 99 %.  Constitutional: No distress . Vital signs reviewed.  HEENT: NCAT, EOMI, oral membranes moist Neck: supple Cardiovascular: RRR without murmur. No JVD    Respiratory/Chest: CTA Bilaterally without wheezes or rales. Normal effort  , non-labored and no increased WOB, occasional cough noted GI/Abdomen: BS normoactive, non-tender, non-distended Ext: no clubbing, cyanosis, or edema Psych: pleasant and cooperative  Skin: warm dry and intact without signs of breakdown Neuro:  Alert and oriented x 3. Normal insight and awareness. Intact Memory. Normal language and speech. Cranial nerve exam unremarkable. UE Motor 5/5. RLE 1+ prox to 2-3/5 ADF/PF. LLE 3- to 3/5 prox to 3/5 distally. T10 sensory level--stable appearance Musculoskeletal: right foot/ankle minimally tender    Assessment/Plan: 1. Functional deficits which require 3+ hours per day of interdisciplinary therapy in a comprehensive inpatient rehab setting. Physiatrist is providing close  team supervision and 24 hour management of active medical problems listed below. Physiatrist and rehab team continue to assess barriers to discharge/monitor patient progress toward functional and medical goals  Care Tool:  Bathing    Body parts bathed by patient: Right arm, Left arm, Chest, Abdomen, Front perineal area, Right upper leg, Left upper leg, Left lower leg, Face, Buttocks, Right lower leg   Body parts bathed by helper: Buttocks, Right lower leg     Bathing assist Assist Level: Contact Guard/Touching assist     Upper Body Dressing/Undressing Upper body dressing   What is the patient wearing?: Pull over shirt    Upper body assist Assist Level: Independent    Lower Body Dressing/Undressing Lower body dressing      What is the patient wearing?: Incontinence brief, Pants     Lower body assist Assist for lower body dressing: Contact Guard/Touching assist     Toileting Toileting    Toileting assist Assist for toileting: Independent with assistive device     Transfers Chair/bed transfer  Transfers assist     Chair/bed transfer assist level: Contact Guard/Touching assist     Locomotion Ambulation   Ambulation assist   Ambulation activity did not occur:  (also ACE for R mild foot drop)  Assist level: Contact  Guard/Touching assist Assistive device: Other (comment) Max distance: 180   Walk 10 feet activity   Assist     Assist level: Contact Guard/Touching assist Assistive device: Walker-rolling   Walk 50 feet activity   Assist Walk 50 feet with 2 turns activity did not occur: Safety/medical concerns (fatigue/endurance)  Assist level: Contact Guard/Touching assist Assistive device: Walker-rolling    Walk 150 feet activity   Assist Walk 150 feet activity did not occur: Safety/medical concerns  Assist level: Contact Guard/Touching assist Assistive device: Walker-rolling    Walk 10 feet on uneven surface  activity   Assist Walk 10  feet on uneven surfaces activity did not occur: Safety/medical concerns (fatigue/balance)         Wheelchair     Assist Is the patient using a wheelchair?: Yes Type of Wheelchair: Manual    Wheelchair assist level: Set up assist Max wheelchair distance: 250 ft    Wheelchair 50 feet with 2 turns activity    Assist        Assist Level: Independent   Wheelchair 150 feet activity     Assist      Assist Level: Independent   Blood pressure 123/76, pulse (!) 53, temperature 98.2 F (36.8 C), resp. rate 18, height _0  (1.778 m), weight 82.9 kg, SpO2 99 %.  Medical Problem List and Plan: 1. 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              -pt is clinically a T10 level. 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 Formulized individual treatment plan based on medical history, underlying problem and comorbidities.   2.  Antithrombotics: -DVT/anticoagulation:  Mechanical:  Antiembolism stockings, knee (TED hose) Bilateral lower extremities             -antiplatelet therapy: none 3. Pain Management: Tylenol as needed 4. Mood/Behavior/Sleep: LCSW to evaluate and provide emotional support             -antipsychotic agents: n/a 5. Neuropsych/cognition: This patient is capable of making decisions on his own behalf. 6. Skin/Wound Care: Routine skin care checks 7. Fluids/Electrolytes/Nutrition: Routine Is and Os and follow-up chemistries on admit   8. AKI on CKD: creatinine is improving/? at baseline             -BUN/Cr appear in range. Encourage appropriate PO -12/4 Cr 1.70 Bun 34, appears stable overall,  continue to monitor 9.  Gout: acute flare 11/29--pain improved--             -continue colchicine 0.6 mg daily             -transitioned to prednisone 40 mg for 5 days from 12/1             -started on allopurinol  for prophylaxis   10. Hypertension             -continue Norvasc 10 mg daily             -continue Coreg 25 mg BID             -11/5 well controlled  continue to monitor      09/02/2022    8:16 PM 09/02/2022    1:10 PM 09/02/2022    5:10 AM  Vitals with BMI  Systolic [phone]  Diastolic 76 80 91  Pulse 53 57 53    11. Spasticity: continue Valium 2 mg q HS, Zanaflex 2 mg q HS             -these seem to be working for him, minimal resting LE tone 12. GERD: continue Protonix   13. Hyperlipidemia: continue Crestor   14. LLL pneumonia: transition to Cefdinir for 3 days from 12/1             -IS/FV             -OOB  -12/4 Will schedule mucinex 668m 12hr BID for cough  -12/5 discussed robitussin PRN available, consider repeat CXR if cough doesn't improve 15. Anemia/chronic disease/dilutional: follow-up CBC  -12/4 improved HGB to 10.2   16. Leukocytosis: on steroids and getting abx for pneumonia; currently afebrile             -wbc's still 15k. Pt  afebrile  -12/ 4 WBC improved to 11.2 17. Neurogenic bladder:             -pt self-caths 3-4 x per day---a little more with IV running  -12/5 Noted to be continent with IC 18. Pt not on an established bowel program. He uses miralax daily and has a bm every 2-3 days. He says he's tried suppositories, dig stim, enemas, etc and they didn't work for him. He had an incontinent bm again this morning             -continue miralax per home regimen  -LBM 12/2, consider additional medication if no BM today    LOS: 5 days A FACE TO FACE EVALUATION WAS PERFORMED  YJennye Boroughs12/01/2022, 8:19 AM

## 2022-09-04 DIAGNOSIS — F4321 Adjustment disorder with depressed mood: Secondary | ICD-10-CM

## 2022-09-04 NOTE — Progress Notes (Signed)
Occupational Therapy Session Note  Patient Details  Name: Maurice Cruz MRN: [id number] Date of Birth: 05-07-1966  Today's Date: 09/04/2022 OT Individual Time: 7544-9201 OT Individual Time Calculation (min): 71 min    Short Term Goals: Week 2:  OT Short Term Goal 1 (Week 2): STG=LTG 2/2 ELOS (continue working towards Mod I overall LTGs)  Skilled Therapeutic Interventions/Progress Updates:    Pt resting in bed upon arrival and ready for therapy. OT intervention with focus on BADLs w/c level at sink with sit<>stand for LB, bed moblity on std bed, sidestepping into bathroom, and TTB transfers. Pt completed all BADLs with CGA when standing. CGA for amb with RW/sidestepping into bathroom. Supervision for TTB transfers. Supervision for bed mobility in ADL apt. Pt returned to room and remained in w/c with all needs within reach.  Therapy Documentation Precautions:  Precautions Precautions: Fall Restrictions Weight Bearing Restrictions: Yes General:   Vital Signs:  Pain:   ADL: ADL Eating: Set up Where Assessed-Eating: Bed level Grooming: Setup Where Assessed-Grooming: Sitting at sink Upper Body Bathing: Setup Where Assessed-Upper Body Bathing: Bed level Lower Body Bathing: Moderate assistance Where Assessed-Lower Body Bathing: Other (Comment) (BSC) Upper Body Dressing: Setup Where Assessed-Upper Body Dressing: Edge of bed Lower Body Dressing: Maximal assistance Where Assessed-Lower Body Dressing: Other (Comment) (BSC) Toileting: Maximal assistance Where Assessed-Toileting: Bedside Commode Toilet Transfer: Moderate assistance Toilet Transfer Method: Squat pivot Science writer: Extra wide drop arm bedside commode Tub/Shower Transfer: Unable to assess Tub/Shower Transfer Method: Unable to assess [employer] Transfer: Unable to assess [employer] Transfer Method: Unable to assess Vision   Perception    Praxis   Balance   Exercises:   Other  Treatments:     Therapy/Group: Individual Therapy  Leroy Libman 09/04/2022, 9:31 AM

## 2022-09-04 NOTE — Progress Notes (Signed)
Occupational Therapy Session Note  Patient Details  Name: Maurice Cruz MRN: [id number] Date of Birth: [birth year]  Today's Date: 09/04/2022 OT Individual Time: 1130-1158 OT Individual Time Calculation (min): 28 min    Short Term Goals: Week 2:  OT Short Term Goal 1 (Week 2): STG=LTG 2/2 ELOS (continue working towards Mod I overall LTGs)  Skilled Therapeutic Interventions/Progress Updates:    OT intervention with focus on standing balance activities and activity tolerance to increase independence with BADLs. Standing activities included standing with RW to place clothes pins on basketball net and removing them to place them in container on lower surface. Pt performed semi squats to place in container. Pt also stood at table to clean all clothes pins with saniwipes. Pt reported RLE fatigue but able to continue. No LOB noted. Pt propelled w/c back to room. Pt remained in w/c with all needs within reach.   Therapy Documentation Precautions:  Precautions Precautions: Fall Restrictions Weight Bearing Restrictions: Yes  Pain:  Pt denies pain this morning  Therapy/Group: Individual Therapy  Leroy Libman 09/04/2022, 12:05 PM

## 2022-09-04 NOTE — Consult Note (Signed)
Neuropsychological Consultation   Patient:   Maurice Cruz   DOB:   1966-09-03  MR Number:  [id number]  Location:  [facility] [id number] [place] [address] Dept: [place]: [phone]           Date of Service:   09/04/2022  Start Time:   2 PM  End Time:   3 PM  Provider/Observer:  Ilean Skill, Psy.D.       Clinical Neuropsychologist       Billing Code/Service: [phone]  Reason for Service:    Maurice Cruz is a 56 year old male referred for neuropsychological consultation due to coping and adjustment with past medical history including spinal cord hematoma in 2022 and recent exacerbation of bilateral lower extremity weakness with concern for development of osteomyelitis or other spinal cord changes.  Patient has had multiple general medical issues identified rather than most acute worries but due to debility and weakness was referred to the comprehensive inpatient rehabilitation unit.   Below is the HPI for the current admission for convenience:   HPI: Maurice Cruz is a 56 year old male with a history of spinal cord hematoma in 2022. Initially diagnosed while living in [place] in 2020 with BLE weakness. 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 He presented to [facility] ED on 08/25/2022 complaining of several history of BLE weakness, left greater than right. NS and neurology consulted. MRI with chronic changes. Neurology confirmed no TM diagnosis  and MRI of complete spine negative for cord compression and stable cord hemorrhage as compared to July 2022. The patient is followed as outpatient with Dr. [name]. AKI atop CKD noted. Lab work-up, LE venous duplex performed. Complained of right ankle pain on 11/27 and started on colchicine for acute gout flare. Also complained of lingering cough and had mild fever to 100.6. Chest x-ray and blood cultures  performed. Possible LLL pneumonia and started on Rocephin and Zithromax on 11/29. Switching to Ceftin for 3 more days today, 11/30. Tolerating HH diet. 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   Current Status:  Patient was awake and alert with both parents in the room with him.  Patient displayed a very positive mood state as entering the room and was oriented and aware of his prior and current medical issues.  Patient admitted that immediately for his presentation to the emergency room with his current issues that he had become depressed with feelings of helplessness and hopelessness with progressive worsening.  Parents admit that the patient had been engaging less with others.  Patient had had significant spinal cord issues while in [place] in 2021 that had improved to some degree but continued to have weakness.  He had been using staircase lifts to get up and down there are 3 level house and is living with his parents for assistance.  However, he had been having a worsening of his status and worsening depression.  Patient reports that his depressive symptoms have improved significantly during his hospital stay and he is now seeing functional gains through therapies and remains highly motivated to continue with therapies postdischarge.  He has a very supportive family who are there to help him out.  Behavioral Observation: Maurice Cruz  presents as a 56 y.o.-year-old Right handed African American Male who appeared his  stated age. his dress was Appropriate and he was Well Groomed and his manners were Appropriate to the situation.  his participation was indicative of Appropriate and Attentive behaviors.  There were physical disabilities noted.  he displayed an appropriate level of cooperation and motivation.    Interactions:    Active  Appropriate  Attention:   within normal limits and attention span and concentration were age appropriate  Memory:   within normal limits; recent and remote memory intact  Visuo-spatial:  within normal limits  Speech (Volume):  normal  Speech:   normal; normal  Thought Process:  Coherent and Relevant  Though Content:  WNL; not suicidal and not homicidal  Orientation:   person, place, time/date, and situation  Judgment:   Good  Planning:   Fair  Affect:    Appropriate  Mood:    Euthymic  Insight:   Good  Intelligence:   normal  Medical History:   Past Medical History:  Diagnosis Date   Anxiety    Asthma    as a child   Depression    ED (erectile dysfunction)    GERD (gastroesophageal reflux disease)    Hx of spinal cord injury 03/2020   Hypertension    Neurogenic bladder    self caths   Neurogenic bowel    has to be digitially stimulated - 04/24/21   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[place])    bilateral legs   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[place])          Patient Active Problem List   Diagnosis Date Noted   Adjustment disorder with depressed mood 09/04/2022   Debility 08/30/2022   Spinal cord disorder ([place]) 08/29/2022   AKI (acute kidney injury) ([place]) 08/26/2022   Normocytic anemia 08/26/2022   Left leg pain 08/26/2022   Pleural effusion 08/26/2022   Anxiety 08/26/2022   GERD (gastroesophageal reflux disease) 08/26/2022   Essential hypertension 08/26/2022   Hyperlipidemia 08/26/2022   Leg weakness 08/25/2022   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[place]) 05/21/2021   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  Patient has been through a lot medically with loss of function impacting quality of life.  Patient reports that most recently his mood has significantly improved.  Family Med/Psych History:  Family History  Problem Relation Age of Onset   Cancer Maternal Grandmother    Impression/DX:  Maurice Cruz is a 55 year old male referred for neuropsychological consultation due to coping and adjustment with past medical history including spinal cord hematoma in 2022 and recent exacerbation of bilateral lower extremity weakness with concern for development of osteomyelitis or other spinal cord changes.  Patient has had multiple general medical issues identified rather than most acute worries but due to debility and weakness was referred to the comprehensive inpatient rehabilitation unit.  Patient was awake and alert with both parents in the room with him.  Patient displayed a very positive mood state as entering the room and was oriented and aware of his prior and current medical issues.  Patient admitted that immediately for his presentation to the emergency room with his current issues that  he had become depressed with feelings of helplessness and hopelessness with progressive worsening.  Parents admit that the patient had been engaging less with others.  Patient had had significant spinal cord issues while in [place] in 2021 that had improved to some degree but continued to have weakness.  He had been using staircase lifts to get up and down there are 3 level house and is living with his parents for assistance.  However, he had been having a worsening of his status and worsening depression.  Patient reports that his depressive symptoms have improved significantly during his hospital stay and he is now seeing functional gains through therapies and remains highly motivated to continue with therapies postdischarge.  He has a very  supportive family who are there to help him out.  Disposition/Plan:  Diagnostic clinical interview with assessment and working on coping and adjustment skills.  Diagnosis:    Adjustment disorder with depressed mood improving         Electronically Signed   _______________________ Ilean Skill, Psy.D. Clinical Neuropsychologist

## 2022-09-04 NOTE — Progress Notes (Signed)
PROGRESS NOTE   Subjective/Complaints: Reports cough is improving. No new concerns this AM.   ROS: Patient denies fever, rash, sore throat, blurred vision, dizziness, nausea, vomiting, diarrhea, shortness of breath or chest pain, joint or back/neck pain, headache, or mood change.  +cough-improving   Objective:   No results found. Recent Labs    09/02/22 0757  WBC 11.2*  HGB 10.2*  HCT 29.9*  PLT 434*    Recent Labs    09/02/22 0757  NA 138  K 4.1  CL 108  CO2 20*  GLUCOSE 125*  BUN 34*  CREATININE 1.70*  CALCIUM 8.9     Intake/Output Summary (Last 24 hours) at 09/04/2022 0828 Last data filed at 09/04/2022 0700 Gross per 24 hour  Intake 720 ml  Output 1625 ml  Net -905 ml         Physical Exam: Vital Signs Blood pressure (!) 131/91, pulse (!) 54, temperature 98.1 F (36.7 C), temperature source Oral, resp. rate 18, height 5\' 10"  (1.778 m), weight 77.6 kg, SpO2 99 %.  Constitutional: No distress . Vital signs reviewed.  HEENT: NCAT, conjugate gaze, oral membranes moist Neck: supple Cardiovascular: RRR without murmur. No JVD    Respiratory/Chest: CTA Bilaterally without wheezes or rales. Normal effort  , non-labored and no increased WOB GI/Abdomen: BS normoactive, non-tender, non-distended Ext: no clubbing, cyanosis, or edema Psych: pleasant and cooperative  Skin: warm dry and intact without signs of breakdown Neuro:  Alert and oriented x 3. Normal insight and awareness. Intact Memory. Normal language and speech. Cranial nerve exam unremarkable. UE Motor 5/5. RLE 1+ prox to 2-3/5 ADF/PF. LLE 3- to 3/5 prox to 3/5 distally. T10 sensory level--stable appearance Musculoskeletal: right foot/ankle minimally tender    Assessment/Plan: 1. Functional deficits which require 3+ hours per day of interdisciplinary therapy in a comprehensive inpatient rehab setting. Physiatrist is providing close team  supervision and 24 hour management of active medical problems listed below. Physiatrist and rehab team continue to assess barriers to discharge/monitor patient progress toward functional and medical goals  Care Tool:  Bathing    Body parts bathed by patient: Right arm, Left arm, Chest, Abdomen, Front perineal area, Right upper leg, Left upper leg, Left lower leg, Face, Buttocks, Right lower leg   Body parts bathed by helper: Buttocks, Right lower leg     Bathing assist Assist Level: Contact Guard/Touching assist     Upper Body Dressing/Undressing Upper body dressing   What is the patient wearing?: Pull over shirt    Upper body assist Assist Level: Independent    Lower Body Dressing/Undressing Lower body dressing      What is the patient wearing?: Incontinence brief, Pants     Lower body assist Assist for lower body dressing: Contact Guard/Touching assist     Toileting Toileting    Toileting assist Assist for toileting: Independent with assistive device     Transfers Chair/bed transfer  Transfers assist     Chair/bed transfer assist level: Contact Guard/Touching assist     Locomotion Ambulation   Ambulation assist   Ambulation activity did not occur:  (also ACE for R mild foot drop)  Assist level: Contact Guard/Touching  assist Assistive device: Other (comment) Max distance: 180   Walk 10 feet activity   Assist     Assist level: Contact Guard/Touching assist Assistive device: Walker-rolling   Walk 50 feet activity   Assist Walk 50 feet with 2 turns activity did not occur: Safety/medical concerns (fatigue/endurance)  Assist level: Contact Guard/Touching assist Assistive device: Walker-rolling    Walk 150 feet activity   Assist Walk 150 feet activity did not occur: Safety/medical concerns  Assist level: Contact Guard/Touching assist Assistive device: Walker-rolling    Walk 10 feet on uneven surface  activity   Assist Walk 10 feet on  uneven surfaces activity did not occur: Safety/medical concerns (fatigue/balance)         Wheelchair     Assist Is the patient using a wheelchair?: Yes Type of Wheelchair: Manual    Wheelchair assist level: Set up assist Max wheelchair distance: 250 ft    Wheelchair 50 feet with 2 turns activity    Assist        Assist Level: Independent   Wheelchair 150 feet activity     Assist      Assist Level: Independent   Blood pressure (!) 131/91, pulse (!) 54, temperature 98.1 F (36.7 C), temperature source Oral, resp. rate 18, height 5\' 10"  (1.778 m), weight 77.6 kg, SpO2 99 %.  Medical Problem List and Plan: 1. 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              -pt is clinically a T10 level. 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  Antithrombotics: -DVT/anticoagulation:  Mechanical:  Antiembolism stockings, knee (TED hose) Bilateral lower extremities             -antiplatelet therapy: none 3. Pain Management: Tylenol as needed 4. Mood/Behavior/Sleep: LCSW to evaluate and provide emotional support             -antipsychotic agents: n/a 5. Neuropsych/cognition: This patient is capable of making decisions on his own behalf. 6. Skin/Wound Care: Routine skin care checks 7. Fluids/Electrolytes/Nutrition: Routine Is and Os and follow-up chemistries on admit   8. AKI on CKD: creatinine is improving/? at baseline             -BUN/Cr appear in range. Encourage appropriate PO -12/4 Cr 1.70 Bun 34, appears stable overall,  continue to monitor 9. Gout: acute flare 11/29--pain improved--             -continue colchicine 0.6 mg daily             -transitioned to prednisone 40 mg for 5 days from 12/1             -started on allopurinol  for prophylaxis   10.  Hypertension             -continue Norvasc 10 mg daily             -continue Coreg 25 mg BID             -11/6 well controlled      09/04/2022    5:00 AM 09/04/2022    4:33 AM 09/03/2022    8:20 PM  Vitals with BMI  Weight 171 lbs 1 oz    BMI 23.76    Systolic  283 151  Diastolic  91 76  Pulse  54 56    11. Spasticity: continue Valium 2 mg q HS, Zanaflex 2 mg q HS             -these seem to be working for him, minimal resting LE tone 12. GERD: continue Protonix   13. Hyperlipidemia: continue Crestor   14. LLL pneumonia: transition to Cefdinir for 3 days from 12/1             -IS/FV             -OOB  -12/4 Will schedule mucinex 600mg  12hr BID for cough  -12/5 discussed robitussin PRN available, consider repeat CXR if cough doesn't improve  -12/6 cough improving, continue to monitor 15. Anemia/chronic disease/dilutional: follow-up CBC  -12/4 improved HGB to 10.2   16. Leukocytosis: on steroids and getting abx for pneumonia; currently afebrile             -wbc's still 15k. Pt  afebrile  -12/ 4 WBC improved to 11.2 17. Neurogenic bladder:             -pt self-caths 3-4 x per day---a little more with IV running  -12/5 Noted to be continent with IC 18. Pt not on an established bowel program. He uses miralax daily and has a bm every 2-3 days. He says he's tried suppositories, dig stim, enemas, etc and they didn't work for him. He had an incontinent bm again this morning             -continue miralax per home regimen  -LBM, pt reports BM 12/4 evening, continue to monitor    LOS: 6 days A FACE TO FACE EVALUATION WAS PERFORMED  Jennye Boroughs 09/04/2022, 8:28 AM

## 2022-09-04 NOTE — Progress Notes (Shared)
Occupational Therapy Weekly Progress Note  Patient Details  Name: Maurice Cruz MRN: [id number] Date of Birth: [birth year]  Beginning of progress report period: August 30, 2022 End of progress report period: September 04, 2022  Patient has met 3 of 3 short term goals.  Pt making excellent progress towards Mod I LTG for BADLs and functional transfers. Pt requires CGA/close S for standing balance and functional transfers/amb with RW. No safety concerns.   Patient continues to demonstrate the following deficits: muscle weakness, decreased cardiorespiratoy endurance, impaired timing and sequencing, abnormal tone, and unbalanced muscle activation, and decreased standing balance and decreased balance strategies and therefore will continue to benefit from skilled OT intervention to enhance overall performance with BADL, iADL, and Reduce care partner burden.  Patient progressing toward long term goals..  Continue plan of care.  OT Short Term Goals Week 1:  OT Short Term Goal 1 (Week 1): Pt will complete 2/3 toileting steps with supervision OT Short Term Goal 1 - Progress (Week 1): Met OT Short Term Goal 2 (Week 1): Pt will complete squat pivot toilet transfer with CGA to [facility] OT Short Term Goal 2 - Progress (Week 1): Met OT Short Term Goal 3 (Week 1): Pt will demonstrate recall of modified strategies to thread BLE into LB clothing with supervision OT Short Term Goal 3 - Progress (Week 1): Met Week 2:  OT Short Term Goal 1 (Week 2): STG=LTG 2/2 ELOS (continue working towards Mod I overall LTGs)  Leroy Libman 09/04/2022, 7:03 AM

## 2022-09-04 NOTE — Progress Notes (Signed)
Physical Therapy Session Note  Patient Details  Name: Maurice Cruz MRN: [id number] Date of Birth: 22-Jul-1966  Today's Date: 09/04/2022 PT Individual Time: 1003-1100 and 1303-1350  PT Individual Time Calculation (min): 57 min and   Short Term Goals: Week 1:  PT Short Term Goal 1 (Week 1): Pt will perform sit to stand with CGA or better PT Short Term Goal 2 (Week 1): Pt will ambulate 100 ft with LRAD PT Short Term Goal 3 (Week 1): Pt will improve TUG score by MCID  Skilled Therapeutic Interventions/Progress Updates: Tx1: Pt presented in w/c agreeable to therapy. Pt denies pain during session. Session focused on BLE strengthening, gait and NMR via forced use. Pt propelled to day room supervision and performed stand pivot transfer with RW to high/low mat CGA. Participated in BLE strengthening performing Sit to stand 2 x 5 without AD from elevated mat. With fatigue pt noted to have increased forward flexion to facilitate transition with technique that improved with seated rest. Pt also participated in toe taps to 4in step x 10 with pt requiring increased circumduction with fatigue to complete task. Pt then transitioned to 4in toe tap with 2.5lb weight on LLE and toe tap to target with 2.5lb weight to RLE to tap. Pt also participated in core strengthening activity of modified sit ups with 4lb weighted ball 2 x 10. Participated in gait training ~173f with RW without theraband to assist flexion. Pt was able to demonstrate enough hip flexion to advance RLE however consistent toe touching ground on step through. Pt however did not demonstrate circumduction when advancing. After seated rest pt also participated in side stepping with RW ~346fx 2 for hip strengthening. At end of side stepping pt transferred to w/c and propelled back to room in same manner as prior. Pt left in w/c at end of session with call bell within reach and needs met.   Tx2: Pt presented in w/c agreeable to therapy. Pt denies pain during  session. Session focused on BWS gait training in treadmill. Pt propelled to day room with supervision. Pt set up in harness and was able to stand with minA to Lite Gait to compete set up. Pt required min A for step up onto treadmill leading with LLE. Pt then participated in ambulation in bouts as noted below with emphasis on clearing R foot. Pt initially with Lite Gait elevated to decrease body weight then gradually lowered by end of second bout. On third bout harness present for safety with no decrease in body weight from harness. Incrementally PTA placing colored dots on treadmill for increased feedback for clearing foot. Pt able to consistently clear foot with minimal circumduction however with very minimal clearance. With increased fatigue pt noted to have intermittent toe catching. Once activity completed and pt able to take extended seated rest pt ambulated ~13019fack to room with CGA and decreased self selected gait speed to focus on foot clearance. In room pt returned to w/c and left at end of session with call bell within reach, family present and needs met.   First bout  2 min [id number] bout 3 min 0.9 mph 220ft 35fd bout  0.6-0.8 mph 141ft  55fherapy Documentation Precautions:  Precautions Precautions: Fall Restrictions Weight Bearing Restrictions: Yes General:   Vital Signs:  Pain: Pain Assessment Pain Scale: 0-10 Pain Score: 0-No pain Mobility:   Locomotion :    Trunk/Postural Assessment :    Balance:   Exercises:   Other  Treatments:      Therapy/Group: Individual Therapy  Chief Walkup 09/04/2022, 2:13 PM

## 2022-09-05 MED ORDER — SORBITOL 70 % SOLN: 30.0000 mL | Freq: Every day | Status: DC | PRN

## 2022-09-05 MED ORDER — POLYETHYLENE GLYCOL 3350 17 G PO PACK
17.0000 g | PACK | Freq: Every day | ORAL | Status: DC | PRN
  Administered 2022-09-05 – 2022-09-08 (×2): 17 g via ORAL
  Filled 2022-09-05 (×2): qty 1

## 2022-09-05 NOTE — Progress Notes (Signed)
Physical Therapy Session Note  Patient Details  Name: Maurice Cruz MRN: [id number] Date of Birth: March 10, 1966  Today's Date: 09/05/2022 PT Individual Time: 1430-1530 PT Individual Time Calculation (min): 60 min   Short Term Goals: Week 1:  PT Short Term Goal 1 (Week 1): Pt will perform sit to stand with CGA or better PT Short Term Goal 2 (Week 1): Pt will ambulate 100 ft with LRAD PT Short Term Goal 3 (Week 1): Pt will improve TUG score by MCID  Skilled Therapeutic Interventions/Progress Updates:    Pt seated in w/c on arrival and agreeable to therapy. No complaint of pain. Pt ambulated x 120 ft and 2 x 180 ft with RW. Cues for increased heel strike and dorsiflexion, as well as increased RLE stride length. Noted trendelenburg gait which responded to tapping facilitation. Pt then participated in forward, back ward, and lateral stepping with UE support on therapist's shoulders (Jazz squares) 2 x ~6 each direction. Cues for picking up feet, neutral hip rotation. Pt challenged by decreased UE support and new motor patterns of lateral and backwards stepping. Pt then ambulated back to room with CGA and RW, cues for increased gait speed, but noted increased compensatory strategies at the right hip. Pt returned to bed with supervision and was left with all needs in reach and alarm active.    Therapy Documentation Precautions:  Precautions Precautions: Fall Restrictions Weight Bearing Restrictions: Yes General:       Therapy/Group: Individual Therapy  Mickel Fuchs 09/05/2022, 3:07 PM

## 2022-09-05 NOTE — Progress Notes (Signed)
Occupational Therapy Session Note  Patient Details  Name: Maurice Cruz MRN: [id number] Date of Birth: 03-03-1966  Today's Date: 09/05/2022 OT Individual Time: 1335-1430 OT Individual Time Calculation (min): 55 min    Short Term Goals: Week 1:  OT Short Term Goal 1 (Week 1): Pt will complete 2/3 toileting steps with supervision OT Short Term Goal 1 - Progress (Week 1): Met OT Short Term Goal 2 (Week 1): Pt will complete squat pivot toilet transfer with CGA to [facility] OT Short Term Goal 2 - Progress (Week 1): Met OT Short Term Goal 3 (Week 1): Pt will demonstrate recall of modified strategies to thread BLE into LB clothing with supervision OT Short Term Goal 3 - Progress (Week 1): Met Week 2:  OT Short Term Goal 1 (Week 2): STG=LTG 2/2 ELOS (continue working towards Mod I overall LTGs)  Skilled Therapeutic Interventions/Progress Updates:     Pt up in w/c upon OT arrival. Eager to shower and perform oral care and set up amb level with RW, Required CGA/close S for amb with RW to retrieve items from dresser and closet then use of RW bag to transport. Doffed clothing UB clothing with indep and LB clothing seated and standing with S. Accessed TTB in stall shower and was S to set up water and complete bathing routine. Educ on strategies for water and soap management with falls prevention emphasis with + understanding and teach back. Pt able to dry off and accessed edge of bench and perform complete UB dressing indep and LB dressing with S. Amb back to sink side and stood for 4 min with CGA/close S for oral care. Once back in w/c, left bedside in w/c with nurse call button and all safety needs in place. No SOB nor pain reported. BORG scale 12 Light-Somewhat Hard RPE self-rating for entire routine with end HR 76 BPM.    Jamey Ripa  09/05/22 1430 PM  Therapy Documentation Precautions:  Precautions Precautions: Fall Restrictions Weight Bearing Restrictions: Yes      Therapy/Group: Individual  Therapy  [name] 09/05/2022, 3:25 PM

## 2022-09-05 NOTE — Progress Notes (Signed)
Physical Therapy Session Note  Patient Details  Name: Maurice Cruz MRN: [id number] Date of Birth: 27-Apr-1966  Today's Date: 09/05/2022 PT Individual Time: 0900-1015 and 1130-1200 PT Individual Time Calculation (min): 75 min and 30 min  Short Term Goals: Week 1:  PT Short Term Goal 1 (Week 1): Pt will perform sit to stand with CGA or better PT Short Term Goal 2 (Week 1): Pt will ambulate 100 ft with LRAD PT Short Term Goal 3 (Week 1): Pt will improve TUG score by MCID  Skilled Therapeutic Interventions/Progress Updates:   First session:  Pt presents supine in bed and agreeable/eager for therapy.  Pt transfers sup to long sit w/ mod I and then dons TED hose and then transfers to EOB and dons shoes w/ set-up, Figure-4 positioning.  Pt transfers sit to stand w/ CGA and step-pivot to w/c w/ RW.  Pt supervision/mod I for all parts to personal w/c.  Pt wheeled self to dayroom w/ supervision.  Pt transfers w/c > mat table w/ supervision lateral scoot/squat pivot pt performing all w/c equipment.  Pt performed sit to stand transfers 2 x 5 w/o UE assist and mod A, verbal cues for forward lean.  Pt performed standing "punches" crossing midline w/ weighted water balls 3 x 10, then progressed to standing w/ LLE on yoga wedge.  Pt performed trunk rotation holding 4# weighted ball standing on Airex cushion, verbal cues to increase rotation to left.  Pt performed partial squats 2 x 10 w/ min A.  Pt then performed partial squats and then overhead press of 4# weighted ball as continuation.  ! LOB w/ activity.  Pt required seated rest breaks between trials 2/2 fatigue especially as noted in RLE - tremoring.  Pt returned to room and pt negotiated w/c into room alongside bed.  All needs in reach.  Second session:  Pt presents sitting in w/c and agreeable to therapy.  Pt wheeled to main gym w/ mod I.  Pt transfers sit to stand w/ CGA throughout session.  Pt amb x 150' x 3 w/ RW and CGA, including turns either direction.   Pt amb w/ good swing-through RLE until near completion of gait distance when R toes catch and increased supination as fatigues.  Pt requires verbal cues for maintaining BOS w/ turns to Right.  Pt returns to room and positions self at side of bed.  All needs in reach.     Therapy Documentation Precautions:  Precautions Precautions: Fall Restrictions Weight Bearing Restrictions: Yes General:   Vital Signs:  Pain: no c/o      Therapy/Group: Individual Therapy  Ladoris Gene 09/05/2022, 10:17 AM

## 2022-09-05 NOTE — Evaluation (Signed)
Recreational Therapy Assessment and Plan  Patient Details  Name: Maurice Cruz MRN: [id number] Date of Birth: 23-Jul-1966 Today's Date: 09/05/2022  Rehab Potential:  Good ELOS:   d/c 12/12  Assessment  Hospital Problem: Principal Problem:   Debility Active Problems:   Spinal cord disorder [facility])     Past Medical History:      Past Medical History:  Diagnosis Date   Anxiety     Asthma      as a child   Depression     ED (erectile dysfunction)     GERD (gastroesophageal reflux disease)     Hx of spinal cord injury 03/2020   Hypertension     Neurogenic bladder      self caths   Neurogenic bowel      has to be digitially stimulated - 04/24/21   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[place])      Past Surgical History:       Past Surgical History:  Procedure Laterality Date   Anorectal biospy   02/09/2021   Colon Biospy   02/09/2021   IR ANGIO INTRA EXTRACRAN SEL COM CAROTID INNOMINATE BILAT MOD SED   04/25/2021   IR ANGIO VERTEBRAL SEL SUBCLAVIAN INNOMINATE UNI L MOD SED   04/25/2021   IR ANGIO VERTEBRAL SEL VERTEBRAL UNI R MOD SED   04/25/2021   IR ANGIO/SPINAL LEFT   04/25/2021   IR ANGIO/SPINAL LEFT   04/25/2021   IR ANGIO/SPINAL LEFT   04/25/2021   IR ANGIO/SPINAL LEFT   04/25/2021   IR ANGIO/SPINAL LEFT   04/25/2021   IR ANGIO/SPINAL LEFT   04/25/2021   IR ANGIO/SPINAL LEFT   04/25/2021   IR ANGIO/SPINAL LEFT   04/25/2021   IR ANGIO/SPINAL LEFT   04/25/2021   IR ANGIO/SPINAL LEFT   04/25/2021   IR ANGIO/SPINAL LEFT   04/25/2021   IR ANGIO/SPINAL LEFT   04/25/2021   IR ANGIO/SPINAL LEFT   04/25/2021   IR ANGIO/SPINAL RIGHT   04/25/2021   IR ANGIO/SPINAL RIGHT   04/25/2021   IR ANGIO/SPINAL RIGHT   04/25/2021   IR ANGIO/SPINAL RIGHT   04/25/2021   IR ANGIO/SPINAL RIGHT   04/25/2021   IR ANGIO/SPINAL RIGHT   04/25/2021   IR ANGIO/SPINAL RIGHT   04/25/2021   IR ANGIO/SPINAL RIGHT   04/25/2021   IR ANGIO/SPINAL RIGHT   04/25/2021   IR ANGIO/SPINAL RIGHT    04/25/2021   IR ANGIO/SPINAL RIGHT   04/25/2021   IR ANGIO/SPINAL RIGHT   04/25/2021   IR ANGIO/SPINAL RIGHT   04/25/2021   IR ANGIO/SPINAL RIGHT   04/25/2021   IR ANGIOGRAM EXTREMITY BILATERAL   04/25/2021   IR RADIOLOGIST EVAL & MGMT   03/21/2021   RADIOLOGY WITH ANESTHESIA N/A 04/25/2021    Procedure: IR WITH ANESTHESIA SPINAL ANGIOGRAM;  Surgeon: Luanne Bras, MD;  Location: [place];  Service: Radiology;  Laterality: N/A;      Assessment & Plan Clinical Impression:  Maurice Cruz is a 56 year old male with a history of spinal cord hematoma in 2022. Initially diagnosed while living in [place] in 2020 with BLE weakness. 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 He presented to [facility] ED on 08/25/2022 complaining of several history of BLE weakness, left greater than right. NS and neurology consulted. MRI with chronic changes. Neurology confirmed no TM diagnosis  and MRI of complete spine negative for cord compression  and stable cord hemorrhage as compared to July 2022. The patient is followed as outpatient with Dr. [name]. AKI atop CKD noted. Lab work-up, LE venous duplex performed. Complained of right ankle pain on 11/27 and started on colchicine for acute gout flare. Also complained of lingering cough and had mild fever to 100.6. Chest x-ray and blood cultures performed. Possible LLL pneumonia and started on Rocephin and Zithromax on 11/29. Switching to Ceftin for 3 more days today, 11/30. Tolerating HH diet. 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 Patient transferred to CIR on 08/29/2022 .     Pt presents with decreased activity tolerance, decreased functional mobility, decreased balance, feelings of stress Limiting pt's independence with leisure/community pursuits.  Met with pt today to discuss TR services  including leisure education, activity analysis/modifications and stress management.  Also discussed the importance of social, emotional, spiritual health in addition to physical health and their effects on overall health and wellness.  Pt stated understanding.  Pt stated stress related to discharge planning and living with his parents temporarily as he states he likes living alone.  Pt reports good social support from family and friends. Plan  No further TR at this time.  Recommendations for other services: None   Discharge Criteria: Patient will be discharged from TR if patient refuses treatment 3 consecutive times without medical reason.  If treatment goals not met, if there is a change in medical status, if patient makes no progress towards goals or if patient is discharged from hospital.  The above assessment, treatment plan, treatment alternatives and goals were discussed and mutually agreed upon: by patient  [place] 09/05/2022, 8:28 AM

## 2022-09-05 NOTE — Progress Notes (Signed)
PROGRESS NOTE   Subjective/Complaints: Reports cough continue to improve. No new concerns. He has not had BM in couple days, usually uses miralax.   ROS: Patient denies fever, rash, sore throat, blurred vision, dizziness, nausea, abdominal pain,  vomiting, diarrhea, shortness of breath or chest pain, joint or back/neck pain, headache, or mood change.  +cough-improving   Objective:   No results found. No results for input(s): "WBC", "HGB", "HCT", "PLT" in the last 72 hours.  No results for input(s): "NA", "K", "CL", "CO2", "GLUCOSE", "BUN", "CREATININE", "CALCIUM" in the last 72 hours.   Intake/Output Summary (Last 24 hours) at 09/05/2022 1214 Last data filed at 09/05/2022 0900 Gross per 24 hour  Intake 240 ml  Output 2225 ml  Net -1985 ml         Physical Exam: Vital Signs Blood pressure 117/75, pulse (!) 59, temperature 98.1 F (36.7 C), resp. rate 17, height 5\' 10"  (1.778 m), weight 77.6 kg, SpO2 99 %.  Constitutional: No distress . Vital signs reviewed.  HEENT: NCAT, conjugate gaze, oral membranes moist Neck: supple Cardiovascular: RRR without murmur. No JVD    Respiratory/Chest: CTA Bilaterally without wheezes or rales. Normal effort  , non-labored and no increased WOB GI/Abdomen: BS normoactive, non-tender, mildly-distended Ext: no clubbing, cyanosis, or edema Psych: pleasant and cooperative  Skin: warm dry and intact without signs of breakdown Neuro:  Alert and oriented x 3. Normal insight and awareness. Intact Memory. Normal language and speech. Cranial nerve exam unremarkable. UE Motor 5/5. RLE 1+ prox to 2-3/5 ADF/PF. LLE 3- to 3/5 prox to 3/5 distally. T10 sensory level--stable appearance Musculoskeletal: right foot/ankle minimally tender    Assessment/Plan: 1. Functional deficits which require 3+ hours per day of interdisciplinary therapy in a comprehensive inpatient rehab setting. Physiatrist is  providing close team supervision and 24 hour management of active medical problems listed below. Physiatrist and rehab team continue to assess barriers to discharge/monitor patient progress toward functional and medical goals  Care Tool:  Bathing    Body parts bathed by patient: Right arm, Left arm, Chest, Abdomen, Front perineal area, Right upper leg, Left upper leg, Left lower leg, Face, Buttocks, Right lower leg   Body parts bathed by helper: Buttocks, Right lower leg     Bathing assist Assist Level: Supervision/Verbal cueing     Upper Body Dressing/Undressing Upper body dressing   What is the patient wearing?: Pull over shirt    Upper body assist Assist Level: Independent    Lower Body Dressing/Undressing Lower body dressing      What is the patient wearing?: Incontinence brief, Pants     Lower body assist Assist for lower body dressing: Supervision/Verbal cueing     Toileting Toileting    Toileting assist Assist for toileting: Independent with assistive device Assistive Device Comment: urinal   Transfers Chair/bed transfer  Transfers assist     Chair/bed transfer assist level: Contact Guard/Touching assist     Locomotion Ambulation   Ambulation assist   Ambulation activity did not occur:  (also ACE for R mild foot drop)  Assist level: Contact Guard/Touching assist Assistive device: Walker-rolling Max distance: 150   Walk 10 feet activity  Assist     Assist level: Contact Guard/Touching assist Assistive device: Walker-rolling   Walk 50 feet activity   Assist Walk 50 feet with 2 turns activity did not occur: Safety/medical concerns (fatigue/endurance)  Assist level: Contact Guard/Touching assist Assistive device: Walker-rolling    Walk 150 feet activity   Assist Walk 150 feet activity did not occur: Safety/medical concerns  Assist level: Contact Guard/Touching assist Assistive device: Walker-rolling    Walk 10 feet on uneven  surface  activity   Assist Walk 10 feet on uneven surfaces activity did not occur: Safety/medical concerns (fatigue/balance)         Wheelchair     Assist Is the patient using a wheelchair?: Yes Type of Wheelchair: Manual    Wheelchair assist level: Set up assist Max wheelchair distance: 150    Wheelchair 50 feet with 2 turns activity    Assist        Assist Level: Independent   Wheelchair 150 feet activity     Assist      Assist Level: Independent   Blood pressure 117/75, pulse (!) 59, temperature 98.1 F (36.7 C), resp. rate 17, height 5\' 10"  (1.778 m), weight 77.6 kg, SpO2 99 %.  Medical Problem List and Plan: 1. 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              -pt is clinically a T10 level. 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  Antithrombotics: -DVT/anticoagulation:  Mechanical:  Antiembolism stockings, knee (TED hose) Bilateral lower extremities             -antiplatelet therapy: none 3. Pain Management: Tylenol as needed 4. Mood/Behavior/Sleep: LCSW to evaluate and provide emotional support             -antipsychotic agents: n/a 5. Neuropsych/cognition: This patient is capable of making decisions on his own behalf. 6. Skin/Wound Care: Routine skin care checks 7. Fluids/Electrolytes/Nutrition: Routine Is and Os and follow-up chemistries on admit   8. AKI on CKD: creatinine is improving/? at baseline             -BUN/Cr appear in range. Encourage appropriate PO -12/4 Cr 1.70 Bun 34, appears stable overall,  continue to monitor 9. Gout: acute flare 11/29--pain improved--             -continue colchicine 0.6 mg daily              -transitioned to prednisone 40 mg for 5 days from 12/1             -started on allopurinol  for prophylaxis   10. Hypertension             -continue Norvasc 10 mg daily             -continue Coreg 25 mg BID             -11/6 well controlled      09/04/2022    7:49 PM 09/04/2022    2:56 PM 09/04/2022    5:00 AM  Vitals with BMI  Weight   171 lbs 1 oz  BMI   96.22  Systolic 297 989   Diastolic 75 87   Pulse 59 59     11. Spasticity: continue Valium 2 mg q HS, Zanaflex 2 mg q HS             -these seem to be working for him, minimal resting LE tone 12. GERD: continue Protonix   13. Hyperlipidemia: continue Crestor   14. LLL pneumonia: transition to Cefdinir for 3 days from 12/1             -IS/FV             -OOB  -12/4 Will schedule mucinex 600mg  12hr BID for cough  -12/5 discussed robitussin PRN available, consider repeat CXR if cough doesn't improve  -12/7 reports cough continues to improve 15. Anemia/chronic disease/dilutional: follow-up CBC  -12/4 improved HGB to 10.2   16. Leukocytosis: on steroids and getting abx for pneumonia; currently afebrile             -wbc's still 15k. Pt  afebrile  -12/ 4 WBC improved to 11.2 17. Neurogenic bladder:             -pt self-caths 3-4 x per day---a little more with IV running  -12/5 Noted to be continent with IC  -12/7 continent, continue IC 18. Pt not on an established bowel program. He uses miralax daily and has a bm every 2-3 days. He says he's tried suppositories, dig stim, enemas, etc and they didn't work for him. He had an incontinent bm again this morning             -continue miralax per home regimen  -LBM, pt reports BM 12/4 evening, continue to monitor  -12/7 Started miralax, pt asked for this to be PRN and he says he will ask for it if no BM this AM    LOS: 7 days A FACE TO FACE EVALUATION WAS PERFORMED  Jennye Boroughs 09/05/2022, 12:14 PM

## 2022-09-06 NOTE — Progress Notes (Signed)
Patient ID: Maurice Cruz, male   DOB: 1965-12-28, 56 y.o.   MRN: [id number]  SW faxed outpatient PT/OT referral to [facility] (p:[phone]/f:[phone]).  Loralee Pacas, MSW, [place] Office: [phone] Cell: [phone] Fax: [phone]

## 2022-09-06 NOTE — Progress Notes (Signed)
PROGRESS NOTE   Subjective/Complaints: No new concerns this AM. Cough is improved.  He did not have a BM today, he did take miralax today and plans to take again this AM.   ROS: Patient denies fever, rash, sore throat, blurred vision, dizziness, nausea, abdominal pain,  vomiting, diarrhea, shortness of breath or chest pain, joint or back/neck pain, headache, vision changes or mood change.  +cough-improving +constipation   Objective:   No results found. No results for input(s): "WBC", "HGB", "HCT", "PLT" in the last 72 hours.  No results for input(s): "NA", "K", "CL", "CO2", "GLUCOSE", "BUN", "CREATININE", "CALCIUM" in the last 72 hours.   Intake/Output Summary (Last 24 hours) at 09/06/2022 0813 Last data filed at 09/06/2022 0700 Gross per 24 hour  Intake 476 ml  Output 1925 ml  Net -1449 ml         Physical Exam: Vital Signs Blood pressure 108/82, pulse (!) 50, temperature 98 F (36.7 C), temperature source Oral, resp. rate 16, height 5\' 10"  (1.778 m), weight 77.5 kg, SpO2 100 %.  Constitutional: No distress . Vital signs reviewed.  HEENT: NCAT, conjugate gaze, oral membranes moist Neck: supple Cardiovascular: RRR without murmur. No JVD    Respiratory/Chest: CTA Bilaterally without wheezes or rales. Normal effort  , non-labored and no increased WOB GI/Abdomen: BS normoactive, non-tender, mildly-distended Ext: no clubbing, cyanosis, or edema Psych: pleasant and cooperative  Skin: warm dry and intact without signs of breakdown Neuro:  Alert and oriented x 3. Follows commands. Normal insight and awareness. Intact Memory. Normal language and speech. CN 2-12 grossly intact. UE Motor 5/5. RLE 1+ prox to 2-3/5 ADF/PF. LLE 3- to 3/5 prox to 3/5 distally. T10 sensory level--stable appearance Musculoskeletal: right foot/ankle minimally tender    Assessment/Plan: 1. Functional deficits which require 3+ hours per day of  interdisciplinary therapy in a comprehensive inpatient rehab setting. Physiatrist is providing close team supervision and 24 hour management of active medical problems listed below. Physiatrist and rehab team continue to assess barriers to discharge/monitor patient progress toward functional and medical goals  Care Tool:  Bathing    Body parts bathed by patient: Right arm, Left arm, Chest, Abdomen, Front perineal area, Right upper leg, Left upper leg, Left lower leg, Face, Buttocks, Right lower leg   Body parts bathed by helper: Buttocks, Right lower leg     Bathing assist Assist Level: Supervision/Verbal cueing     Upper Body Dressing/Undressing Upper body dressing   What is the patient wearing?: Pull over shirt    Upper body assist Assist Level: Independent    Lower Body Dressing/Undressing Lower body dressing      What is the patient wearing?: Incontinence brief, Pants     Lower body assist Assist for lower body dressing: Supervision/Verbal cueing     Toileting Toileting    Toileting assist Assist for toileting: Independent with assistive device Assistive Device Comment: urinal   Transfers Chair/bed transfer  Transfers assist     Chair/bed transfer assist level: Contact Guard/Touching assist     Locomotion Ambulation   Ambulation assist   Ambulation activity did not occur:  (also ACE for R mild foot drop)  Assist level:  Contact Guard/Touching assist Assistive device: Walker-rolling Max distance: 150   Walk 10 feet activity   Assist     Assist level: Contact Guard/Touching assist Assistive device: Walker-rolling   Walk 50 feet activity   Assist Walk 50 feet with 2 turns activity did not occur: Safety/medical concerns (fatigue/endurance)  Assist level: Contact Guard/Touching assist Assistive device: Walker-rolling    Walk 150 feet activity   Assist Walk 150 feet activity did not occur: Safety/medical concerns  Assist level: Contact  Guard/Touching assist Assistive device: Walker-rolling    Walk 10 feet on uneven surface  activity   Assist Walk 10 feet on uneven surfaces activity did not occur: Safety/medical concerns (fatigue/balance)         Wheelchair     Assist Is the patient using a wheelchair?: Yes Type of Wheelchair: Manual    Wheelchair assist level: Set up assist Max wheelchair distance: 150    Wheelchair 50 feet with 2 turns activity    Assist        Assist Level: Independent   Wheelchair 150 feet activity     Assist      Assist Level: Independent   Blood pressure 108/82, pulse (!) 50, temperature 98 F (36.7 C), temperature source Oral, resp. rate 16, height 5\' 10"  (1.778 m), weight 77.5 kg, SpO2 100 %.  Medical Problem List and Plan: 1. 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              -pt is clinically a T10 level. 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  Antithrombotics: -DVT/anticoagulation:  Mechanical:  Antiembolism stockings, knee (TED hose) Bilateral lower extremities             -antiplatelet therapy: none 3. Pain Management: Tylenol as needed 4. Mood/Behavior/Sleep: LCSW to evaluate and provide emotional support             -antipsychotic agents: n/a 5. Neuropsych/cognition: This patient is capable of making decisions on his own behalf. 6. Skin/Wound Care: Routine skin care checks 7. Fluids/Electrolytes/Nutrition: Routine Is and Os and follow-up chemistries on admit   8. AKI on CKD: creatinine is improving/? at baseline             -BUN/Cr appear in range. Encourage appropriate PO -12/4 Cr 1.70 Bun 34, appears stable overall,  continue to monitor  9.  Gout: acute flare 11/29--pain improved--             -continue colchicine 0.6 mg daily             -transitioned to prednisone 40 mg for 5 days from 12/1             -started on allopurinol  for prophylaxis   10. Hypertension             -continue Norvasc 10 mg daily             -continue Coreg 25 mg BID             -11/6 well controlled      09/06/2022    3:45 AM 09/05/2022  8:13 PM 09/05/2022    2:35 PM  Vitals with BMI  Systolic 518 335 825  Diastolic 82 79 79  Pulse 50 58 65    11. Spasticity: continue Valium 2 mg q HS, Zanaflex 2 mg q HS             -these seem to be working for him, minimal resting LE tone 12. GERD: continue Protonix   13. Hyperlipidemia: continue Crestor   14. LLL pneumonia: transition to Cefdinir for 3 days from 12/1             -IS/FV             -OOB  -12/4 Will schedule mucinex 600mg  12hr BID for cough  -12/5 discussed robitussin PRN available, consider repeat CXR if cough doesn't improve  -12/8 cough continues to improve 15. Anemia/chronic disease/dilutional: follow-up CBC  -12/4 improved HGB to 10.2   16. Leukocytosis: on steroids and getting abx for pneumonia; currently afebrile             -wbc's still 15k. Pt  afebrile  -12/ 4 WBC improved to 11.2 17. Neurogenic bladder:             -pt self-caths 3-4 x per day---a little more with IV running  -12/5 Noted to be continent with IC  -12/7 continent, continue IC 18. Pt not on an established bowel program. He uses miralax daily and has a bm every 2-3 days. He says he's tried suppositories, dig stim, enemas, etc and they didn't work for him. He had an incontinent bm again this morning             -continue miralax per home regimen  -LBM, pt reports BM 12/4 evening, continue to monitor  -12/7 Started miralax, pt asked for this to be PRN and he says he will ask for it if no BM this AM  -12/8 Discussed trying some PRN sorbitol, pt would like to hold off as he says miralax usually works,  discussed  taking some later today if no BM By then    LOS: 8 days A FACE TO FACE EVALUATION WAS PERFORMED  Jennye Boroughs 09/06/2022, 8:13 AM

## 2022-09-06 NOTE — Discharge Instructions (Signed)
Inpatient Rehab Discharge Instructions  Maurice Cruz Discharge date and time: 09/10/2022  Activities/Precautions/ Functional Status: Activity: no lifting, driving, or strenuous exercise until cleared by MD Diet: cardiac diet Wound Care: none needed Functional status:  ___ No restrictions     ___ Walk up steps independently ___ 24/7 supervision/assistance   ___ Walk up steps with assistance _x__ Intermittent supervision/assistance  ___ Bathe/dress independently ___ Walk with walker     ___ Bathe/dress with assistance ___ Walk Independently    ___ Shower independently ___ Walk with assistance    __x_ Shower with assistance _x__ No alcohol     ___ Return to work/school ________ Special Instructions: No driving, alcohol consumption or tobacco use.         My questions have been answered and I understand these instructions. I will adhere to these goals and the provided educational materials after my discharge from the hospital.  Patient/Caregiver Signature _______________________________ Date __________  Clinician Signature _______________________________________ Date __________  Please bring this form and your medication list with you to all your follow-up doctor's appointments.

## 2022-09-06 NOTE — Progress Notes (Signed)
Occupational Therapy Session Note  Patient Details  Name: Maurice Cruz MRN: [id number] Date of Birth: [birth year]  Today's Date: 09/06/2022 OT Individual Time: 1103-1200 OT Individual Time Calculation (min): 57 min    Short Term Goals: Week 2:  OT Short Term Goal 1 (Week 2): STG=LTG 2/2 ELOS (continue working towards Mod I overall LTGs)  Skilled Therapeutic Interventions/Progress Updates:  Pain reported during session as 0/10.  Patient actively participated in the [facility] program in a group setting for social participation. Session focused on education and training in breathing techniques to regulate the nervous system, gentle yoga poses with a focus on sitting balance, hip openers, dynamic sitting balance, increasing activity tolerance, guided meditation to regulate attention and guided discussion on the topic of "feeling whole." Patient required MIN verbal cues and supervision assist for proper body mechanics and sequencing. Exited session with pt seated in w/c and all needs within reach.   Therapy Documentation Precautions:  Precautions Precautions: Fall Restrictions Weight Bearing Restrictions: Yes  Therapy/Group: Individual Therapy  Corinne Ports [facility] 09/06/2022, 12:28 PM

## 2022-09-06 NOTE — Progress Notes (Signed)
Discussed with pt incontinence and catheterizations more frequently and how fluid intake affects urine. Pt denies incontinence. Pt in agreement.  Sheela Stack, LPN

## 2022-09-06 NOTE — Progress Notes (Signed)
Physical Therapy Weekly Progress Note  Patient Details  Name: Maurice Cruz MRN: [id number] Date of Birth: 1965-12-27  Beginning of progress report period: August 30, 2022 End of progress report period: September 06, 2022  Today's Date: 09/06/2022 PT Individual Time: 8003-4917 PT Individual Time Calculation (min): 30 min   Patient has met 3 of 3 short term goals.  Pt is progressing well toward LTGs. He demonstrates gait at [facility] level and transfers at supervision level. Pt is competent and able to direct his care. Pt's gait is progressing well, he is able to ambulate with fewer compensations and improved mechanics. Pt agreeable to using toe cap to allow for improved mechanics on carpet and outdoor surfaces, and allow continues progress toward full foot clearance by utilizing appropriate muscles instead of compensation patterns.   Patient continues to demonstrate the following deficits muscle weakness, impaired timing and sequencing and decreased coordination, and decreased standing balance and decreased balance strategies and therefore will continue to benefit from skilled PT intervention to increase functional independence with mobility.  Patient progressing toward long term goals..  Continue plan of care.  PT Short Term Goals Week 1:  PT Short Term Goal 1 (Week 1): Pt will perform sit to stand with CGA or better PT Short Term Goal 1 - Progress (Week 1): Met PT Short Term Goal 2 (Week 1): Pt will ambulate 100 ft with LRAD PT Short Term Goal 2 - Progress (Week 1): Met PT Short Term Goal 3 (Week 1): Pt will improve TUG score by MCID PT Short Term Goal 3 - Progress (Week 1): Met Week 2:  PT Short Term Goal 1 (Week 2): =LTGs d/t ELOS  Skilled Therapeutic Interventions/Progress Updates:    pt received in bed and agreeable to therapy. Pt reports increased wrist pain again today, premedicated. Rest and positioning provided as needed. No noted increase with mobility. Pt donned teds and shoes with  set up A. Bed mobility mod I with bed rails. Pt then performed supervision squat pivot to w/c. Pt propelled w/c with BUE. Squat pivot to mat in same manner. Pt performed Sit to stand x 10 with no UE support to prepare for gait with loftstrand crutches. Pt then ambulated x 90 ft with loft strand crutches and min A fading to CGA. Cues for technique and sequencing, as well as gait mechanics. Pt noted to tire more quickly, but was able to ambulate with minimal compensations and was able to clear foot most steps. Discussed plan to practice with lofstrands for therapeutic benefit and increased gait challenge, but plan to d/c with RW. Pt agreeable. Pt returned to room and remained in w/c with all needs in reach.   Session 2: Pt seated in w/c on arrival and agreeable to therapy. No complaint of pain. Pt's father present for family education. Discussed pt's condition and needs which are similar to previous hospital stay. Pt's father observed session for insight into pt condition, but no hands on training performed as pt not likely to require hands on assist at d/c. Pt propelled w/c with BUE throughout session mod I. Pt navigated 8" curb step x 2 with RW and CGA. Bed mobility in apartment with supervision, including moving RW to end of bed. Discussed home set up. Pt with noted increase in toe catch on carpet, which pt has at home. Pt agreeable to getting toe cap for improved gait mechanics. Pt then ambulated >150 ft to day room with CGA and RW. Pt performed lateral stepping first with  RW and then with hands on therapist's shoulders for increased balance challenge. Pt with difficulty clearing foot, especially with fatigue. Pt then performed 2 bouts of backwards stepping x ~20 ft with RW and CGA. Pt ambulated back to room with 2.5 lb ankle weights to increase intensity of gait training. Pt able to maintain similar mechanics to earlier in session with weights in place but reports fatigue. Pt returned to bed with supervision  and was left with all needs in reach and alarm active.   Therapy Documentation Precautions:  Precautions Precautions: Fall Restrictions Weight Bearing Restrictions: Yes General:      Therapy/Group: Individual Therapy  Mickel Fuchs 09/06/2022, 12:50 PM

## 2022-09-06 NOTE — Progress Notes (Signed)
Occupational Therapy Session Note  Patient Details  Name: Maurice Cruz MRN: [id number] Date of Birth: December 27, 1965  Today's Date: 09/06/2022 OT Individual Time: 1300-1355 OT Individual Time Calculation (min): 55 min    Short Term Goals: Week 1:  OT Short Term Goal 1 (Week 1): Pt will complete 2/3 toileting steps with supervision OT Short Term Goal 1 - Progress (Week 1): Met OT Short Term Goal 2 (Week 1): Pt will complete squat pivot toilet transfer with CGA to [facility] OT Short Term Goal 2 - Progress (Week 1): Met OT Short Term Goal 3 (Week 1): Pt will demonstrate recall of modified strategies to thread BLE into LB clothing with supervision OT Short Term Goal 3 - Progress (Week 1): Met Week 2:  OT Short Term Goal 1 (Week 2): STG=LTG 2/2 ELOS (continue working towards Mod I overall LTGs)  Skilled Therapeutic Interventions/Progress Updates:    1:1 Fameducation with pt and pt's parents. Discussed safety at home and functional ambulation with the RW with contact guard for safety. PT able to perform squat pivots mod I. Discussed showering with distant supervision with lateral leans, encouragement of cathing schedule to avoid bladder overflow and what a neurogentic bladder means.   Transitioned to the gym with parents and patient and continued to focus on overall strengthening and coordination to improve balance and safety with ADLs  including sit to stand with and with fading out UE support; supervision to min A; stepping forwards and backwards over threshold, top tapping with only one Ue support on a foam block; Kinetron in sitting with more difficult resistant  and then transitioning into standing and performing stepping with bilateral UE support to one UE support. Pt performed functional ambulation  with RW back to room with ocntact guard to very close Supervision.   Therapy Documentation Precautions:  Precautions Precautions: Fall Restrictions Weight Bearing Restrictions: Yes   Pain:  No  c/o pain in session but right knee did demonstrate some crepitus     Therapy/Group: Individual Therapy  Willeen Cass [facility] 09/06/2022, 3:40 PM

## 2022-09-07 NOTE — Progress Notes (Signed)
Patient stated he had a BM with therapy yesteday, 12/8, at 1300.

## 2022-09-07 NOTE — Progress Notes (Signed)
PROGRESS NOTE   Subjective/Complaints:  Asking if IV can be removed   ROS: denies cp/SOB, neg N/V/D   Objective:   No results found. No results for input(s): "WBC", "HGB", "HCT", "PLT" in the last 72 hours.  No results for input(s): "NA", "K", "CL", "CO2", "GLUCOSE", "BUN", "CREATININE", "CALCIUM" in the last 72 hours.   Intake/Output Summary (Last 24 hours) at 09/07/2022 1430 Last data filed at 09/07/2022 0835 Gross per 24 hour  Intake 486 ml  Output 1715 ml  Net -1229 ml         Physical Exam: Vital Signs Blood pressure 124/73, pulse (!) 51, temperature 98.2 F (36.8 C), resp. rate 17, height 5\' 10"  (1.778 m), weight 77.5 kg, SpO2 99 %.   General: No acute distress Mood and affect are appropriate Heart: Regular rate and rhythm no rubs murmurs or extra sounds Lungs: Clear to auscultation, breathing unlabored, no rales or wheezes Abdomen: Positive bowel sounds, soft nontender to palpation, nondistended Extremities: No clubbing, cyanosis, or edema Skin: No evidence of breakdown, no evidence of rash   Neuro:  Alert and oriented x 3. Follows commands. Normal insight and awareness. Intact Memory. Normal language and speech. CN 2-12 grossly intact. UE Motor 5/5. RLE 1+ prox to 2-3/5 ADF/PF. LLE 3- to 3/5 prox to 3/5 distally. T10 sensory level--stable appearance Musculoskeletal: right foot/ankle minimally tender    Assessment/Plan: 1. Functional deficits which require 3+ hours per day of interdisciplinary therapy in a comprehensive inpatient rehab setting. Physiatrist is providing close team supervision and 24 hour management of active medical problems listed below. Physiatrist and rehab team continue to assess barriers to discharge/monitor patient progress toward functional and medical goals  Care Tool:  Bathing    Body parts bathed by patient: Right arm, Left arm, Chest, Abdomen, Front perineal area, Right  upper leg, Left upper leg, Left lower leg, Face, Buttocks, Right lower leg   Body parts bathed by helper: Buttocks, Right lower leg     Bathing assist Assist Level: Supervision/Verbal cueing     Upper Body Dressing/Undressing Upper body dressing   What is the patient wearing?: Pull over shirt    Upper body assist Assist Level: Independent    Lower Body Dressing/Undressing Lower body dressing      What is the patient wearing?: Incontinence brief, Pants     Lower body assist Assist for lower body dressing: Supervision/Verbal cueing     Toileting Toileting    Toileting assist Assist for toileting: Independent with assistive device Assistive Device Comment: urinal   Transfers Chair/bed transfer  Transfers assist     Chair/bed transfer assist level: Contact Guard/Touching assist     Locomotion Ambulation   Ambulation assist   Ambulation activity did not occur:  (also ACE for R mild foot drop)  Assist level: Contact Guard/Touching assist Assistive device: Walker-rolling Max distance: 150   Walk 10 feet activity   Assist     Assist level: Contact Guard/Touching assist Assistive device: Walker-rolling   Walk 50 feet activity   Assist Walk 50 feet with 2 turns activity did not occur: Safety/medical concerns (fatigue/endurance)  Assist level: Contact Guard/Touching assist Assistive device: Walker-rolling  Walk 150 feet activity   Assist Walk 150 feet activity did not occur: Safety/medical concerns  Assist level: Contact Guard/Touching assist Assistive device: Walker-rolling    Walk 10 feet on uneven surface  activity   Assist Walk 10 feet on uneven surfaces activity did not occur: Safety/medical concerns (fatigue/balance)         Wheelchair     Assist Is the patient using a wheelchair?: Yes Type of Wheelchair: Manual    Wheelchair assist level: Set up assist Max wheelchair distance: 150    Wheelchair 50 feet with 2 turns  activity    Assist        Assist Level: Independent   Wheelchair 150 feet activity     Assist      Assist Level: Independent   Blood pressure 124/73, pulse (!) 51, temperature 98.2 F (36.8 C), resp. rate 17, height 5\' 10"  (1.778 m), weight 77.5 kg, SpO2 99 %.  Medical Problem List and Plan: 1. 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              -pt is clinically a T10 level. 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  Antithrombotics: -DVT/anticoagulation:  Mechanical:  Antiembolism stockings, knee (TED hose) Bilateral lower extremities             -antiplatelet therapy: none 3. Pain Management: Tylenol as needed 4. Mood/Behavior/Sleep: LCSW to evaluate and provide emotional support             -antipsychotic agents: n/a 5. Neuropsych/cognition: This patient is capable of making decisions on his own behalf. 6. Skin/Wound Care: Routine skin care checks 7. Fluids/Electrolytes/Nutrition: Routine Is and Os and follow-up chemistries on admit   8. AKI on CKD: creatinine is improving/? at baseline             -BUN/Cr appear in range. Encourage appropriate PO -12/4 Cr 1.70 Bun 34, appears stable overall,  continue to monitor  9. Gout: acute flare 11/29--pain improved--             -continue colchicine 0.6 mg daily             -transitioned to prednisone 40 mg for 5 days from 12/1             -started on allopurinol  for prophylaxis   10. Hypertension             -continue Norvasc 10 mg daily             -continue Coreg 25 mg BID             -11/6 well controlled      09/07/2022    1:35 PM 09/07/2022    4:26 AM 09/06/2022    7:12 PM  Vitals with BMI  Systolic [phone]   Diastolic 73 78 71  Pulse 51 52 57    11. Spasticity: continue Valium 2 mg q HS, Zanaflex 2 mg q HS             -these seem to be working for him, minimal resting LE tone 12.  GERD: continue Protonix   13. Hyperlipidemia: continue Crestor   14. LLL pneumonia: transition to Cefdinir for 3 days from 12/1             -IS/FV             -OOB  -12/4 Will schedule mucinex 600mg  12hr BID for cough  -12/5 discussed robitussin PRN available, consider repeat CXR if cough doesn't improve  -12/8 cough continues to improve 15. Anemia/chronic disease/dilutional: follow-up CBC  -12/4 improved HGB to 10.2   16. Leukocytosis: on steroids and getting abx for pneumonia; currently afebrile             -wbc's still 15k. Pt  afebrile  -12/ 4 WBC improved to 11.2 17. Neurogenic bladder:             -pt self-caths 3-4 x per day---a little more with IV running  -12/5 Noted to be continent with IC  -12/7 continent, continue IC 18. Pt not on an established bowel program. He uses miralax daily and has a bm every 2-3 days. He says he's tried suppositories, dig stim, enemas, etc and they didn't work for him. He had an incontinent bm again this morning             -continue miralax per home regimen  -LBM, pt reports BM 12/4 evening, continue to monitor  -12/7 Started miralax, pt asked for this to be PRN and he says he will ask for it if no BM this AM  -12/8 Discussed trying some PRN sorbitol, pt would like to hold off as he says miralax usually works,  discussed taking some later today if no BM By then   Pt states he had BM but not recorded LOS: 9 days A FACE TO [facility] E Omarius [facility]ham 09/07/2022, 2:30 PM

## 2022-09-08 NOTE — Progress Notes (Signed)
Occupational Therapy Session Note  Patient Details  Name: [name] MRN: [id number] Date of Birth: Mar 01, 1966  Today's Date: 09/08/2022 OT Individual Time: 0830-0930 OT Individual Time Calculation (min): 60 min    Short Term Goals: Week 2:  OT Short Term Goal 1 (Week 2): STG=LTG 2/2 ELOS (continue working towards Mod I overall LTGs)  Skilled Therapeutic Interventions/Progress Updates:    Upon OT arrival, pt semi recumbent in bed, very pleasant. Pt requesting to shower and get ready for the day. Pt agreeable to OT session and reports no pain. Treatment intervention with a focus on self care retraining. Pt completes full shower ADL at the levels below. Pt completes functional mobility with RW and close supervision and functional transfer with close supervision. Pt ambulates with loft strand crutches with CGA to bed with no LOB. Pt progressing towards stated OT goals and continues to benefit from OT services to achieve highest level of independence. Pt was left in bed at end of session with all needs met and safety measures in place.   Therapy Documentation Precautions:  Precautions Precautions: Fall Restrictions Weight Bearing Restrictions: Yes  ADL: Grooming: Modified independent Where Assessed-Grooming: Sitting at sink Upper Body Bathing: Supervision/safety Where Assessed-Upper Body Bathing: Shower Lower Body Bathing: Supervision/safety Where Assessed-Lower Body Bathing: Shower Upper Body Dressing: Modified independent (Device) Where Assessed-Upper Body Dressing: Wheelchair Lower Body Dressing: Supervision/safety Where Assessed-Lower Body Dressing: Teaching laboratory technician: Close supervision Social research officer, government Method: Heritage manager: Radio broadcast assistant   Therapy/Group: Individual Therapy  Marvetta Gibbons 09/08/2022, 9:13 AM

## 2022-09-08 NOTE — Progress Notes (Signed)
Removed IV per order. No complications, pt tolerated well.

## 2022-09-09 DIAGNOSIS — D638 Anemia in other chronic diseases classified elsewhere: Secondary | ICD-10-CM

## 2022-09-09 LAB — CBC
HCT: 33.4 % — ABNORMAL LOW (ref 39.0–52.0)
Hemoglobin: 11.2 g/dL — ABNORMAL LOW (ref 13.0–17.0)
MCH: 27.8 pg (ref 26.0–34.0)
MCHC: 33.5 g/dL (ref 30.0–36.0)
MCV: 82.9 fL (ref 80.0–100.0)
Platelets: 443 K/uL — ABNORMAL HIGH (ref 150–400)
RBC: 4.03 MIL/uL — ABNORMAL LOW (ref 4.22–5.81)
RDW: 14.6 % (ref 11.5–15.5)
WBC: 7.8 K/uL (ref 4.0–10.5)
nRBC: 0 % (ref 0.0–0.2)

## 2022-09-09 LAB — BASIC METABOLIC PANEL WITH GFR
Anion gap: 10 (ref 5–15)
BUN: 36 mg/dL — ABNORMAL HIGH (ref 6–20)
CO2: 19 mmol/L — ABNORMAL LOW (ref 22–32)
Calcium: 8.8 mg/dL — ABNORMAL LOW (ref 8.9–10.3)
Chloride: 107 mmol/L (ref 98–111)
Creatinine, Ser: 2.14 mg/dL — ABNORMAL HIGH (ref 0.61–1.24)
GFR, Estimated: 36 mL/min — ABNORMAL LOW
Glucose, Bld: 134 mg/dL — ABNORMAL HIGH (ref 70–99)
Potassium: 3.9 mmol/L (ref 3.5–5.1)
Sodium: 136 mmol/L (ref 135–145)

## 2022-09-09 NOTE — Progress Notes (Signed)
Occupational Therapy Discharge Summary  Patient Details  Name: Maurice Cruz MRN: [id number] Date of Birth: 1966-03-27  Date of Discharge from OT service:September 12, 2022  Today's Date: 09/09/2022 OT Individual Time: 1345-1445 OT Individual Time Calculation (min): 60 min    Patient has met 8 of 8 long term goals due to improved activity tolerance, improved balance, postural control, ability to compensate for deficits, and improved coordination.  Patient to discharge at overall Modified Independent level.  Patient's care partner is independent to provide the necessary physical assistance at discharge.    Reasons goals not met: All goals met   Recommendation:  No further OT is recommended at this time as Pt is able to complete BADLs and IADL tasks at mod I level when using RW or wc.   Equipment: No equipment provided  Reasons for discharge: treatment goals met and discharge from hospital  Patient/family agrees with progress made and goals achieved: Yes  OT Discharge Skilled Therapeutic Interventions/ Progress Updates:  Pt received resting in wc with family present in room. Pt in good spirits and motivated to participate in skilled therapy session with a focus on functional mobility, UE strength/endurance training, transfer training, and IADL retraining. Pt reporting 0/10 pain.   Pt ambulated to ADL apartment ~250 ft using RW mod I. Pt completed kitchen mobility task using RW while gathering food items located on counter tops within the kitchen. Pt able to safely gather 10/10 items and place them into overhead cabinet with mod I.   Pt ambulated to therapy gym using RW and transfer to EOM mod I. Pt completed sit<>stands x10 while reaching overhead to hook horse shoes on vertical mirror. Pt instructed to utilize L/R single UE to push up from mat and place horse shoe over mirror. Pt then slowly sat EOM while lowering self without UE support and with controled movements. Pt bale to  complete task with mod I +time when giving maximal effort.   Pt completed seated BUE reciprocal ball hitting task using 4# weighted bar to hit a ball back and forth to OT to increase UE strength/endurance. Pt able to tolerate completing task ~4 minutes without requiring a rest break. Pt ambulated back to room using RW mod I and was left resting in wc with family present in room, call bell in reach, and all needs met.   Precautions/Restrictions  Precautions Precautions: Fall Restrictions Weight Bearing Restrictions: No General   Vital Signs Therapy Vitals Temp: 98.2 F (36.8 C) Pulse Rate: (Abnormal) 58 Resp: 18 BP: 128/85 Patient Position (if appropriate): Sitting Oxygen Therapy SpO2: 100 % O2 Device: Room Air Pain Pain Assessment Pain Scale: 0-10 Pain Score: 0-No pain ADL ADL Eating: Independent Where Assessed-Eating: Edge of bed Grooming: Modified independent Where Assessed-Grooming: Sitting at sink Upper Body Bathing: Modified independent Where Assessed-Upper Body Bathing: Shower Lower Body Bathing: Modified independent Where Assessed-Lower Body Bathing: Shower Upper Body Dressing: Modified independent (Device) Where Assessed-Upper Body Dressing: Wheelchair Lower Body Dressing: Modified independent Where Assessed-Lower Body Dressing: Wheelchair Toileting: Modified independent Where Assessed-Toileting: Bedside Commode Toilet Transfer: Modified independent Armed forces technical officer Method: Engineer, water: Extra wide drop arm bedside commode Tub/Shower Transfer: Modified independent Clinical cytogeneticist Method: Stand pivot, Ambulating Tub/Shower Equipment: Civil engineer, contracting with back Social research officer, government: Modified independent Social research officer, government Method: Heritage manager: Radio broadcast assistant Vision Baseline Vision/History: 1 Wears glasses Patient Visual Report: No change from baseline Vision Assessment?: No apparent visual  deficits Perception  Perception: Within Functional Limits Praxis  Praxis: Intact Cognition Cognition Overall Cognitive Status: Within Functional Limits for tasks assessed Arousal/Alertness: Awake/alert Orientation Level: Person;Place;Situation Person: Oriented Place: Oriented Situation: Oriented Memory: Appears intact Awareness: Appears intact Problem Solving: Appears intact Safety/Judgment: Appears intact Brief Interview for Mental Status (BIMS) Repetition of Three Words (First Attempt): 3 Temporal Orientation: Year: Correct Temporal Orientation: Month: Accurate within 5 days Temporal Orientation: Day: Correct Recall: "Sock": Yes, no cue required Recall: "Blue": Yes, no cue required Recall: "Bed": Yes, no cue required BIMS Summary Score: 15 Sensation Sensation Light Touch: Impaired by gross assessment Additional Comments: diminished sensation below T10 level, no change from baseline; intact BUE Coordination Gross Motor Movements are Fluid and Coordinated: No Fine Motor Movements are Fluid and Coordinated: No Coordination and Movement Description: BLE dyscoordinated d/t SCI; increased time for motor palnning UE movements Motor  Motor Motor: Paraplegia;Clonus;Abnormal tone Motor - Skilled Clinical Observations: chronic paraparesis, R>L. RLE spasticity and clonus Motor - Discharge Observations: chronic paraparesis, R>L. RLE minor clonus at R ankle Mobility  Bed Mobility Bed Mobility: Supine to Sit;Sit to Supine Supine to Sit: Independent Sit to Supine: Independent Transfers Sit to Stand: Independent with assistive device  Trunk/Postural Assessment  Cervical Assessment Cervical Assessment: Within Functional Limits Thoracic Assessment Thoracic Assessment: Within Functional Limits Lumbar Assessment Lumbar Assessment: Within Functional Limits Postural Control Postural Control: Within Functional Limits  Balance Balance Balance Assessed: Yes Dynamic Sitting  Balance Dynamic Sitting - Balance Support: Feet supported Dynamic Sitting - Level of Assistance: 6: Modified independent (Device/Increase time) Static Standing Balance Static Standing - Balance Support: Bilateral upper extremity supported Static Standing - Level of Assistance: 6: Modified independent (Device/Increase time) Dynamic Standing Balance Dynamic Standing - Balance Support: Bilateral upper extremity supported Dynamic Standing - Level of Assistance: 5: Stand by assistance Extremity/Trunk Assessment RUE Assessment RUE Assessment: Within Functional Limits LUE Assessment LUE Assessment: Within Functional Limits   Janey Genta 09/09/2022, 4:09 PM

## 2022-09-09 NOTE — Progress Notes (Signed)
Inpatient Rehabilitation Discharge Medication Review by a Pharmacist  A complete drug regimen review was completed for this patient to identify any potential clinically significant medication issues.  High Risk Drug Classes Is patient taking? Indication by Medication  Antipsychotic No   Anticoagulant No   Antibiotic No   Opioid Yes Tramadol prn pain  Antiplatelet No   Hypoglycemics/insulin No   Vasoactive Medication Yes Amlodipine, carvedilol - BP  Chemotherapy No   Other Yes Colchicine, allopurinol - gout Diazepam - sleep, anxiety Pantoprazole - reflux Rosuvastatin - HLD Tizanidine - muscle spasms     Type of Medication Issue Identified Description of Issue Recommendation(s)  Drug Interaction(s) (clinically significant)     Duplicate Therapy     Allergy     No Medication Administration End Date     Incorrect Dose     Additional Drug Therapy Needed     Significant med changes from prior encounter (inform family/care partners about these prior to discharge).    Other       Clinically significant medication issues were identified that warrant physician communication and completion of prescribed/recommended actions by midnight of the next day:  No  Pharmacist comments: None  Time spent performing this drug regimen review (minutes):  30 minutes  Thank you Anette Guarneri, PharmD

## 2022-09-09 NOTE — Progress Notes (Signed)
Physical Therapy Discharge Summary  Patient Details  Name: Maurice Cruz MRN: [id number] Date of Birth: 13-Jan-1966  Date of Discharge from PT service:September 09, 2022  Today's Date: 09/09/2022 PT Individual Time:0830-0930, 1130-1200 PT Individual Time Calculation (min): 60 min, 30 min    Patient has met 12 of 12 long term goals due to improved balance, improved postural control, increased strength, improved awareness, and improved coordination.  Patient to discharge at an ambulatory level Supervision.   Patient's care partner is independent to provide the necessary physical assistance at discharge. Pt to d/c home with his parents. Pt is competent to direct his care as needed and his parents participated in family education. Pt is mod I at w/c level but requires supervision for ambulation with RW at this time.   Reasons goals not met: NA  Recommendation:  Patient will benefit from ongoing skilled PT services in outpatient setting to continue to advance safe functional mobility, address ongoing impairments in strength, ROM, hypertonicity, endurance, and minimize fall risk.  Equipment: No equipment provided  Reasons for discharge: treatment goals met and discharge from hospital  Patient/family agrees with progress made and goals achieved: Yes  Skilled Therapeutic Interventions/Progress Updates:  Session 1: pt received in bed and agreeable to therapy. Pt dons ted hose and shoes with set up, bed mobility mod I with bed rails. Stand pivot transfer to w/c with RW and supervision. Pt propelled w/c with BUE mod I. Pt ambulated 2 x 180 ft with loft strand crutches for gait challenge. Pt ambulated with supervision and slow gait speed, but able to coordinate with minimal cueing. Pt demoes greatly improved gait pattern, with low foot clearance with RLE but minimal to no toe catch and improving dorsiflexion. During rest breaks, reviewed goals and assessed BLE tone and strength. Pt then performed TUG  test x 4 trials with untimed warm up, 46 sec, 35 sec, and 35 sec, with average of 38.6 seconds. Reviewed that while this still demonstrates increased risk of falls, it is greatly improved from eval score of 114 seconds.Pt returned to to room and remained in chair with all needs in reach.   Session 2: Pt seated in w/c on arrival and agreeable to therapy. Pt reports mild R wrist pain with activity, RN made aware. Session focused on providing pt with HEP as documented below. Pt performed one set of each activity. Discussed ways to increase intensity as needed. Pt expressed understanding. Pt then ambulated back to room with distant supervision and remained in w/c with all needs in reach.   Access Code: [email or web address] URL: [email or web address] Date: 09/09/2022 Prepared by: Ailene Rud  Exercises - Sit to Stand Without Arm Support  - 1 x daily - 7 x weekly - 4-6 sets - 6-8 reps - Side Stepping with Counter Support  - 1 x daily - 7 x weekly - 4-6 sets - 2-4 reps - Standing March with Counter Support  - 1 x daily - 7 x weekly - 3-4 sets - 20 reps - Standing Hip Abduction with Ankle Weight  - 1 x daily - 7 x weekly - 4-6 sets - 6-8 reps  PT Discharge Precautions/Restrictions Precautions Precautions: Fall Restrictions Weight Bearing Restrictions: No Vital Signs   Pain Pain Assessment Pain Scale: 0-10 Pain Score: 0-No pain Pain Interference Pain Interference Pain Effect on Sleep: 1. Rarely or not at all Pain Interference with Therapy Activities: 1. Rarely or not at all Pain Interference with Day-to-Day Activities: 1. Rarely or not at all Vision/Perception  Vision - History Ability to See in Adequate Light: 0 Adequate Perception Perception: Within Functional Limits Praxis Praxis: Intact  Cognition Overall Cognitive Status: Within Functional Limits for tasks assessed Arousal/Alertness: Awake/alert Orientation Level: Oriented X4 Memory: Appears intact Awareness: Appears  intact Problem Solving: Appears intact Safety/Judgment: Appears intact Sensation Sensation Additional Comments: diminished sensation below T10 level, no change from baseline; intact BUE Coordination Gross Motor Movements are Fluid and Coordinated: No Fine Motor Movements are Fluid and Coordinated: No Coordination and Movement Description: BLE dyscoordinated d/t SCI Motor  Motor Motor: Paraplegia;Clonus;Abnormal tone Motor - Skilled Clinical Observations: chronic paraparesis, R>L. RLE spasticity and clonus Motor - Discharge Observations: chronic paraparesis, R>L. RLE minor clonus at R ankle  Mobility Bed Mobility Bed Mobility: Supine to Sit;Sit to Supine Supine to Sit: Independent Sit to Supine: Independent Transfers Transfers: Public house manager;Sit to Stand;Stand Pivot Transfers Sit to Stand: Independent with assistive device Stand Pivot Transfers: Independent with assistive device Squat Pivot Transfers: Independent with assistive device Transfer (Assistive device): Rolling walker Locomotion  Gait Ambulation: Yes Gait Assistance: Supervision/Verbal cueing Gait Distance (Feet): 500 Feet Assistive device: Rolling walker Gait Gait: Yes Gait Pattern: Impaired (pt with improved gait pattern from eval, but still with limited RLE foot clearance and occ hip hike and circumduction compensations. Pt uses slow careful gait speed to minimize gait impairments.) Gait velocity: decreased Stairs / Additional Locomotion Stairs: Yes Stairs Assistance: Contact Guard/Touching assist Stair Management Technique: With walker Number of Stairs: 1 Height of Stairs: 8 Ramp: Supervision/Verbal cueing Curb: Nurse, mental health Mobility: Yes Wheelchair Assistance: Independent with Camera operator: Both upper extremities Wheelchair Parts Management: Independent Distance: >1000 ft  Trunk/Postural Assessment  Cervical  Assessment Cervical Assessment: Within Functional Limits Thoracic Assessment Thoracic Assessment: Within Functional Limits Lumbar Assessment Lumbar Assessment: Within Functional Limits Postural Control Postural Control: Within Functional Limits  Balance Balance Balance Assessed: Yes Standardized Balance Assessment Standardized Balance Assessment: Timed Up and Go Test Timed Up and Go Test TUG: Normal TUG Normal TUG (seconds): 38.6 Static Sitting Balance Static Sitting - Balance Support: Feet supported Static Sitting - Level of Assistance: 7: Independent Dynamic Sitting Balance Dynamic Sitting - Balance Support: Feet supported Dynamic Sitting - Level of Assistance: 7: Independent Static Standing Balance Static Standing - Balance Support: Bilateral upper extremity supported Static Standing - Level of Assistance: 5: Stand by assistance Dynamic Standing Balance Dynamic Standing - Balance Support: Bilateral upper extremity supported Dynamic Standing - Level of Assistance: 5: Stand by assistance Extremity Assessment      RLE Assessment RLE Assessment: Exceptions to [facility] RLE Strength Right Hip Flexion: 3-/5 Right Knee Flexion: 4-/5 Right Knee Extension: 3+/5 Right Ankle Dorsiflexion: 4+/5 Right Ankle Plantar Flexion: 4+/5 RLE Tone RLE Tone: Modified Ashworth Body Part - Modified Ashworth Scale: Hamstrings;Gastrocnemius;Quadriceps Hamstrings - Modified Ashworth Scale for Grading Hypertonia RLE: No increase in muscle tone QUADRICEPS - Modified Ashworth Scale for Grading Hypertonia RLE: No increase in muscle tone GASTROCNEMIUS - Modified Ashworth Scale for Grading Hypertonia RLE: Slight increase in muscle tone, manifested by a catch, followed by minimal resistance throughout the remainder (less than half) of the ROM LLE Assessment LLE Assessment: Exceptions to [facility] LLE Strength Left Hip Flexion: 4+/5 Left Knee Flexion: 4+/5 Left Knee Extension: 4+/5 Left Ankle Dorsiflexion:  5/5   Kimberlynn Lumbra C Mallory Schaad 09/09/2022, 12:38 PM

## 2022-09-09 NOTE — Progress Notes (Signed)
Occupational Therapy Session Note  Patient Details  Name: Maurice Cruz MRN: [id number] Date of Birth: [birth year]  Today's Date: 09/09/2022 OT Individual Time: 1035-1130 OT Individual Time Calculation (min): 55 min    Short Term Goals: Week 2:  OT Short Term Goal 1 (Week 2): STG=LTG 2/2 ELOS (continue working towards Mod I overall LTGs)  Skilled Therapeutic Interventions/Progress Updates:   Full am routine sink side level this visit as shower occurred yesterday. No pain reported, HR 60 SpO2 98% at start of session. Mod I for all aspects of ADL's including TED hose and tennis shoes w/c and intermittent amb and standing with standing tolerance >3-4 minutes.  Pt mod I w/c propulsion from room to tub room >200 ft with mod I for transfer amb level with RW from w/c left outside tub room with mod I on and off tub transfer bench.  Continued to propel w/c another 175 ft to atrium waiting room with transfer then to RW for community amb training on and off regular waiting room arm chair with mod I. Pt then had to negotiate some with RW through tighter spaces with need to side stap 5 ft with mod I. No LOB. Finished session with pt w/c level self propulsion from [place] atrium back to room. +VSR. Left pt with all safety needs in place and nurse call button within reach.   Therapy Documentation Precautions:  Precautions Precautions: Fall Restrictions Weight Bearing Restrictions: Yes    Therapy/Group: Individual Therapy  [name] 09/09/2022, 7:58 AM

## 2022-09-09 NOTE — Plan of Care (Signed)
  Problem: RH Balance Goal: LTG Patient will maintain dynamic standing with ADLs (OT) Description: LTG:  Patient will maintain dynamic standing balance with assist during activities of daily living (OT)  Outcome: Completed/Met Flowsheets (Taken 08/30/2022 1227 by Jennefer Bravo E, OT) LTG: Pt will maintain dynamic standing balance during ADLs with: Independent with assistive device   Problem: Sit to Stand Goal: LTG:  Patient will perform sit to stand in prep for activites of daily living with assistance level (OT) Description: LTG:  Patient will perform sit to stand in prep for activites of daily living with assistance level (OT) Outcome: Completed/Met Flowsheets (Taken 08/30/2022 1227 by Jennefer Bravo E, OT) LTG: PT will perform sit to stand in prep for activites of daily living with assistance level: Independent with assistive device   Problem: RH Bathing Goal: LTG Patient will bathe all body parts with assist levels (OT) Description: LTG: Patient will bathe all body parts with assist levels (OT) Outcome: Completed/Met Flowsheets (Taken 08/30/2022 1227 by Jennefer Bravo E, OT) LTG: Pt will perform bathing with assistance level/cueing: Set up assist    Problem: RH Dressing Goal: LTG Patient will perform lower body dressing w/assist (OT) Description: LTG: Patient will perform lower body dressing with assist, with/without cues in positioning using equipment (OT) Outcome: Completed/Met Flowsheets (Taken 08/30/2022 1227 by Jennefer Bravo E, OT) LTG: Pt will perform lower body dressing with assistance level of: Independent with assistive device   Problem: RH Toileting Goal: LTG Patient will perform toileting task (3/3 steps) with assistance level (OT) Description: LTG: Patient will perform toileting task (3/3 steps) with assistance level (OT)  Outcome: Completed/Met Flowsheets (Taken 08/30/2022 1227 by Jennefer Bravo E, OT) LTG: Pt will perform toileting task (3/3 steps) with assistance level:  Independent with assistive device   Problem: RH Simple Meal Prep Goal: LTG Patient will perform simple meal prep w/assist (OT) Description: LTG: Patient will perform simple meal prep with assistance, with/without cues (OT). Outcome: Completed/Met Flowsheets (Taken 08/30/2022 1227 by Jennefer Bravo E, OT) LTG: Pt will perform simple meal prep with assistance level of: Independent with assistive device LTG: Pt will perform simple meal prep w/level of: Wheelchair level   Problem: RH Toilet Transfers Goal: LTG Patient will perform toilet transfers w/assist (OT) Description: LTG: Patient will perform toilet transfers with assist, with/without cues using equipment (OT) Outcome: Completed/Met Flowsheets (Taken 08/30/2022 1227 by Jennefer Bravo E, OT) LTG: Pt will perform toilet transfers with assistance level of: Independent with assistive device   Problem: RH Tub/Shower Transfers Goal: LTG Patient will perform tub/shower transfers w/assist (OT) Description: LTG: Patient will perform tub/shower transfers with assist, with/without cues using equipment (OT) Outcome: Completed/Met Flowsheets (Taken 08/30/2022 1227 by Jennefer Bravo E, OT) LTG: Pt will perform tub/shower stall transfers with assistance level of: Supervision/Verbal cueing

## 2022-09-09 NOTE — Progress Notes (Signed)
PROGRESS NOTE   Subjective/Complaints:  No new concerns or complaints today. Working with therapy.  Has mild cough this Am but it improved.  ROS: denies cp/SOB, neg N/V/D, abdominal pain, HA Occasional cough   Objective:   No results found. Recent Labs    09/09/22 0544  WBC 7.8  HGB 11.2*  HCT 33.4*  PLT 443*    Recent Labs    09/08/22 2350  NA 136  K 3.9  CL 107  CO2 19*  GLUCOSE 134*  BUN 36*  CREATININE 2.14*  CALCIUM 8.8*     Intake/Output Summary (Last 24 hours) at 09/09/2022 0813 Last data filed at 09/09/2022 0736 Gross per 24 hour  Intake 596 ml  Output 1150 ml  Net -554 ml         Physical Exam: Vital Signs Blood pressure 137/76, pulse 64, temperature 98.2 F (36.8 C), temperature source Oral, resp. rate 18, height 5\' 10"  (1.778 m), weight 77.5 kg, SpO2 100 %.   General: No acute distress Mood and affect are appropriate Heart: Regular rate and rhythm no rubs murmurs or extra sounds Lungs: Clear to auscultation, breathing unlabored, no rales or wheezes, nonlabored breathing Abdomen: Positive bowel sounds, soft nontender to palpation, nondistended Extremities: No clubbing, cyanosis, or edema Skin: No evidence of breakdown, no evidence of rash   Neuro:  Alert and oriented x 3. Follows commands. Normal insight and awareness. Intact Memory. Normal language and speech. CN 2-12 grossly intact. UE Motor 5/5. RLE 1+ prox to 2-3/5 ADF/PF. LLE 3- to 3/5 prox to 3/5 distally. T10 sensory level--stable appearance Musculoskeletal: right foot/ankle minimally tender    Assessment/Plan: 1. Functional deficits which require 3+ hours per day of interdisciplinary therapy in a comprehensive inpatient rehab setting. Physiatrist is providing close team supervision and 24 hour management of active medical problems listed below. Physiatrist and rehab team continue to assess barriers to discharge/monitor  patient progress toward functional and medical goals  Care Tool:  Bathing    Body parts bathed by patient: Right arm, Left arm, Chest, Abdomen, Front perineal area, Right upper leg, Left upper leg, Left lower leg, Face, Buttocks, Right lower leg   Body parts bathed by helper: Buttocks, Right lower leg     Bathing assist Assist Level: Supervision/Verbal cueing     Upper Body Dressing/Undressing Upper body dressing   What is the patient wearing?: Pull over shirt    Upper body assist Assist Level: Independent    Lower Body Dressing/Undressing Lower body dressing      What is the patient wearing?: Incontinence brief, Pants     Lower body assist Assist for lower body dressing: Supervision/Verbal cueing     Toileting Toileting    Toileting assist Assist for toileting: Independent with assistive device Assistive Device Comment: urinal   Transfers Chair/bed transfer  Transfers assist     Chair/bed transfer assist level: Contact Guard/Touching assist     Locomotion Ambulation   Ambulation assist   Ambulation activity did not occur:  (also ACE for R mild foot drop)  Assist level: Contact Guard/Touching assist Assistive device: Walker-rolling Max distance: 150   Walk 10 feet activity   Assist  Assist level: Contact Guard/Touching assist Assistive device: Walker-rolling   Walk 50 feet activity   Assist Walk 50 feet with 2 turns activity did not occur: Safety/medical concerns (fatigue/endurance)  Assist level: Contact Guard/Touching assist Assistive device: Walker-rolling    Walk 150 feet activity   Assist Walk 150 feet activity did not occur: Safety/medical concerns  Assist level: Contact Guard/Touching assist Assistive device: Walker-rolling    Walk 10 feet on uneven surface  activity   Assist Walk 10 feet on uneven surfaces activity did not occur: Safety/medical concerns (fatigue/balance)         Wheelchair     Assist Is the  patient using a wheelchair?: Yes Type of Wheelchair: Manual    Wheelchair assist level: Set up assist Max wheelchair distance: 150    Wheelchair 50 feet with 2 turns activity    Assist        Assist Level: Independent   Wheelchair 150 feet activity     Assist      Assist Level: Independent   Blood pressure 137/76, pulse 64, temperature 98.2 F (36.8 C), temperature source Oral, resp. rate 18, height 5\' 10"  (1.778 m), weight 77.5 kg, SpO2 100 %.  Medical Problem List and Plan: 1. 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              -pt is clinically a T10 level. 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  Antithrombotics: -DVT/anticoagulation:  Mechanical:  Antiembolism stockings, knee (TED hose) Bilateral lower extremities             -antiplatelet therapy: none 3. Pain Management: Tylenol as needed 4. Mood/Behavior/Sleep: LCSW to evaluate and provide emotional support             -antipsychotic agents: n/a 5. Neuropsych/cognition: This patient is capable of making decisions on his own behalf. 6. Skin/Wound Care: Routine skin care checks 7. Fluids/Electrolytes/Nutrition: Routine Is and Os and follow-up chemistries on admit   8. AKI on CKD: creatinine is improving/? at baseline             -BUN/Cr appear in range. Encourage appropriate PO -12/11 Cr above his baseline, discussed increased oral fluid intake, recheck Wednesday  9. Gout: acute flare 11/29--pain improved--             -continue colchicine 0.6 mg daily             -transitioned to prednisone 40 mg for 5 days from 12/1             -started on allopurinol  for prophylaxis   10. Hypertension              -continue Norvasc 10 mg daily             -continue Coreg 25 mg BID             -12/11 well controlled      09/09/2022    4:57 AM 09/08/2022    8:10 PM 09/08/2022    1:17 PM  Vitals with BMI  Systolic [phone]  Diastolic 76 89 81  Pulse 64 57 50    11. Spasticity: continue Valium 2 mg q HS, Zanaflex 2 mg q HS             -these seem to be working for him, minimal resting LE tone 12. GERD: continue Protonix   13. Hyperlipidemia: continue Crestor   14. LLL pneumonia: transition to Cefdinir for 3 days from 12/1             -IS/FV             -OOB  -12/4 Will schedule mucinex 600mg  12hr BID for cough  -12/5 discussed robitussin PRN available, consider repeat CXR if cough doesn't improve  -12/8 cough continues to improve  -12/11 gradually improving 15. Anemia/chronic disease/dilutional: follow-up CBC  -12/4 improved HGB to 10.2  -12/11 improved to 11/2   16. Leukocytosis: on steroids and getting abx for pneumonia; currently afebrile             -wbc's still 15k. Pt  afebrile  -12/ 4 WBC improved to 11.2  12/11 WNL today 17. Neurogenic bladder:             -pt self-caths 3-4 x per day---a little more with IV running  -12/5 Noted to be continent with IC  -12/7 continent, continue IC 18. Pt not on an established bowel program. He uses miralax daily and has a bm every 2-3 days. He says he's tried suppositories, dig stim, enemas, etc and they didn't work for him. He had an incontinent bm again this morning             -continue miralax per home regimen  -LBM, pt reports BM 12/4 evening, continue to monitor  -12/7 Started miralax, pt asked for this to be PRN and he says he will ask for it if no BM this AM  -12/8 Discussed trying some PRN sorbitol, pt would like to hold off as he says miralax usually works,  discussed taking some later today if no BM By then   Pt states he had BM but not recorded LOS: 11 days A FACE TO FACE EVALUATION WAS PERFORMED  Jennye Boroughs 09/09/2022, 8:13 AM

## 2022-09-10 DIAGNOSIS — M109 Gout, unspecified: Secondary | ICD-10-CM

## 2022-09-10 LAB — BASIC METABOLIC PANEL
Anion gap: 10 (ref 5–15)
BUN: 38 mg/dL — ABNORMAL HIGH (ref 6–20)
CO2: 20 mmol/L — ABNORMAL LOW (ref 22–32)
Calcium: 9 mg/dL (ref 8.9–10.3)
Chloride: 106 mmol/L (ref 98–111)
Creatinine, Ser: 1.9 mg/dL — ABNORMAL HIGH (ref 0.61–1.24)
GFR, Estimated: 41 mL/min — ABNORMAL LOW (ref >60)
Glucose, Bld: 96 mg/dL (ref 70–99)
Potassium: 4.4 mmol/L (ref 3.5–5.1)
Sodium: 136 mmol/L (ref 135–145)

## 2022-09-10 MED ORDER — ACETAMINOPHEN 325 MG PO TABS: 325.0000 mg | ORAL_TABLET | ORAL | Status: AC | PRN

## 2022-09-10 MED ORDER — GUAIFENESIN-DM 100-10 MG/5ML PO SYRP: 5.0000 mL | ORAL_SOLUTION | Freq: Four times a day (QID) | ORAL | 0 refills | Status: DC | PRN

## 2022-09-10 MED ORDER — GUAIFENESIN ER 600 MG PO TB12: 600.0000 mg | ORAL_TABLET | Freq: Two times a day (BID) | ORAL | Status: DC

## 2022-09-10 MED ORDER — ALLOPURINOL 100 MG PO TABS: 50.0000 mg | ORAL_TABLET | Freq: Every day | ORAL | 0 refills | Status: DC

## 2022-09-10 NOTE — Progress Notes (Signed)
PROGRESS NOTE   Subjective/Complaints:  He reports breathing and cough has improved. He denies any gout pain in several days.  Looking forward to discharge. He asked about serum Cr values and this was discussed.  He would like to start allopurinol for gout prevention.   ROS: denies cp/SOB, neg N/V/D, abdominal pain, HA Occasional cough   Objective:   No results found. Recent Labs    09/09/22 0544  WBC 7.8  HGB 11.2*  HCT 33.4*  PLT 443*    Recent Labs    09/08/22 2350 09/10/22 0516  NA 136 136  K 3.9 4.4  CL 107 106  CO2 19* 20*  GLUCOSE 134* 96  BUN 36* 38*  CREATININE 2.14* 1.90*  CALCIUM 8.8* 9.0     Intake/Output Summary (Last 24 hours) at 09/10/2022 1031 Last data filed at 09/10/2022 7654 Gross per 24 hour  Intake 840 ml  Output 3075 ml  Net -2235 ml         Physical Exam: Vital Signs Blood pressure 127/82, pulse (!) 52, temperature 97.8 F (36.6 C), resp. rate 18, height 5\' 10"  (1.778 m), weight 77.5 kg, SpO2 99 %.   General: No acute distress Mood and affect are appropriate Heart: Regular rate and rhythm no rubs murmurs or extra sounds Lungs: Clear to auscultation, breathing unlabored, no rales or wheezes, nonlabored breathing Abdomen: Positive bowel sounds, soft nontender to palpation, nondistended Extremities: No clubbing, cyanosis, or edema Skin: No evidence of breakdown, no evidence of rash   Neuro:  Alert and oriented x 3. Follows commands. Normal insight and awareness. Intact Memory. Normal language and speech. CN 2-12 grossly intact. UE Motor 5/5. RLE 1+ prox to 2-3/5 ADF/PF. LLE 3- to 3/5 prox to 3/5 distally. T10 sensory level--stable appearance Musculoskeletal: right foot/ankle  without significant swelling or tenderness   Assessment/Plan: 1. Functional deficits which require 3+ hours per day of interdisciplinary therapy in a comprehensive inpatient rehab  setting. Physiatrist is providing close team supervision and 24 hour management of active medical problems listed below. Physiatrist and rehab team continue to assess barriers to discharge/monitor patient progress toward functional and medical goals  Care Tool:  Bathing    Body parts bathed by patient: Right arm, Left arm, Chest, Abdomen, Front perineal area, Right upper leg, Left upper leg, Left lower leg, Face, Buttocks, Right lower leg   Body parts bathed by helper: Buttocks, Right lower leg     Bathing assist Assist Level: Independent with assistive device     Upper Body Dressing/Undressing Upper body dressing   What is the patient wearing?: Pull over shirt    Upper body assist Assist Level: Independent    Lower Body Dressing/Undressing Lower body dressing      What is the patient wearing?: Incontinence brief, Pants     Lower body assist Assist for lower body dressing: Independent with assitive device     Toileting Toileting    Toileting assist Assist for toileting: Independent with assistive device Assistive Device Comment: urinal and self caths   Transfers Chair/bed transfer  Transfers assist     Chair/bed transfer assist level: Independent with assistive device Chair/bed transfer assistive device: Gilford Rile  Locomotion Ambulation   Ambulation assist   Ambulation activity did not occur:  (also ACE for R mild foot drop)  Assist level: Supervision/Verbal cueing Assistive device: Walker-rolling Max distance: 500 ft   Walk 10 feet activity   Assist     Assist level: Supervision/Verbal cueing Assistive device: Walker-rolling   Walk 50 feet activity   Assist Walk 50 feet with 2 turns activity did not occur: Safety/medical concerns (fatigue/endurance)  Assist level: Supervision/Verbal cueing Assistive device: Walker-rolling    Walk 150 feet activity   Assist Walk 150 feet activity did not occur: Safety/medical concerns  Assist level:  Supervision/Verbal cueing Assistive device: Walker-rolling    Walk 10 feet on uneven surface  activity   Assist Walk 10 feet on uneven surfaces activity did not occur: Safety/medical concerns (fatigue/balance)   Assist level: Supervision/Verbal cueing Assistive device: Walker-rolling   Wheelchair     Assist Is the patient using a wheelchair?: Yes Type of Wheelchair: Manual    Wheelchair assist level: Independent Max wheelchair distance: 500    Wheelchair 50 feet with 2 turns activity    Assist        Assist Level: Independent   Wheelchair 150 feet activity     Assist      Assist Level: Independent   Blood pressure 127/82, pulse (!) 52, temperature 97.8 F (36.6 C), resp. rate 18, height 5\' 10"  (1.778 m), weight 77.5 kg, SpO2 99 %.  Medical Problem List and Plan: 1. 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              -pt is clinically a T10 level. 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  Antithrombotics: -DVT/anticoagulation:  Mechanical:  Antiembolism stockings, knee (TED hose) Bilateral lower extremities             -antiplatelet therapy: none 3. Pain Management: Tylenol as needed 4. Mood/Behavior/Sleep: LCSW to evaluate and provide emotional support             -antipsychotic agents: n/a 5. Neuropsych/cognition: This patient is capable of making decisions on his own behalf. 6. Skin/Wound Care: Routine skin care checks 7. Fluids/Electrolytes/Nutrition: Routine Is and Os and follow-up chemistries on admit   8. AKI on CKD: creatinine is improving/? at baseline             -BUN/Cr appear in range. Encourage appropriate PO -12/11 Cr  above his baseline, discussed increased oral fluid intake, recheck Wednesday -Cr improved to 1.90 today, closer to his baseline, continue oral fluid intake, discussed importance of routine IC, he says he has urology f/u in a few days  9. Gout: acute flare 11/29--pain improved--             -continue colchicine 0.6 mg daily             -transitioned to prednisone 40 mg for 5 days from 12/1             -stop colchicine , pain has been resolved for several days,  start allopurinol low dose, f/u with PCP   10. Hypertension             -  continue Norvasc 10 mg daily             -continue Coreg 25 mg BID             -12/12 well controlled overall, continue current      09/10/2022    5:28 AM 09/09/2022    8:07 PM 09/09/2022    1:13 PM  Vitals with BMI  Systolic 128 786 767  Diastolic 82 70 85  Pulse 52 55 58    11. Spasticity: continue Valium 2 mg q HS, Zanaflex 2 mg q HS             -these seem to be working for him, minimal resting LE tone 12. GERD: continue Protonix   13. Hyperlipidemia: continue Crestor   14. LLL pneumonia: transition to Cefdinir for 3 days from 12/1             -IS/FV             -OOB  -12/4 Will schedule mucinex 600mg  12hr BID for cough  -12/5 discussed robitussin PRN available, consider repeat CXR if cough doesn't improve  -12/8 cough continues to improve  -12/12 denies SOB, cough improved 15. Anemia/chronic disease/dilutional: follow-up CBC  -12/4 improved HGB to 10.2  -12/11 improved to 11/2   16. Leukocytosis: on steroids and getting abx for pneumonia; currently afebrile             -wbc's still 15k. Pt  afebrile  -12/ 4 WBC improved to 11.2  12/11 WNL today 17. Neurogenic bladder:             -pt self-caths 3-4 x per day---a little more with IV running  -12/5 Noted to be continent with IC  -12/7 continent, continue IC  -12/12 discussed importance of regular IC schedule for GU health, he has urology followup in a few days as outpatient 18. Pt not  on an established bowel program. He uses miralax daily and has a bm every 2-3 days. He says he's tried suppositories, dig stim, enemas, etc and they didn't work for him. He had an incontinent bm again this morning             -continue miralax per home regimen  -LBM, pt reports BM 12/4 evening, continue to monitor  -12/7 Started miralax, pt asked for this to be PRN and he says he will ask for it if no BM this AM  -12/8 Discussed trying some PRN sorbitol, pt would like to hold off as he says miralax usually works,  discussed taking some later today if no BM By then   Pt states he had BM but not recorded LOS: 12 days A FACE TO FACE EVALUATION WAS PERFORMED  Jennye Boroughs 09/10/2022, 10:31 AM

## 2022-09-10 NOTE — Progress Notes (Signed)
Patient discharged this shift, with family by side. Voices understanding of discharge instructions

## 2022-09-10 NOTE — Progress Notes (Signed)
Inpatient Rehabilitation Care Coordinator Discharge Note   Patient Details  Name: JAMIER URBAS MRN: [id number] Date of Birth: 01-17-1966   Discharge location: D.c to home  Length of Stay: 11 days  Discharge activity level: Supervision  Home/community participation: Limited  Patient response KS:KSHNGI Literacy - How often do you need to have someone help you when you read instructions, pamphlets, or other written material from your doctor or pharmacy?: Never  Patient response TJ:LLVDIX Isolation - How often do you feel lonely or isolated from those around you?: Never  Services provided included: MD, RD, PT, SLP, OT, RN, CM, Pharmacy, TR, Neuropsych, SW  Financial Services:  Charity fundraiser Utilized: [place] offered to/list presented to: Yes  Follow-up services arranged:  Outpatient, DME    Outpatient Servicies: [facility] for outpatient PT/OT DME : [employer] for new catheters- compact speedicath 11fr    Patient response to transportation need: Is the patient able to respond to transportation needs?: Yes In the past 12 months, has lack of transportation kept you from medical appointments or from getting medications?: No In the past 12 months, has lack of transportation kept you from meetings, work, or from getting things needed for daily living?: No   Comments (or additional information):  Patient/Family verbalized understanding of follow-up arrangements:  Yes  Individual responsible for coordination of the follow-up plan: contact pt  Confirmed correct DME delivered: Rana Snare 09/10/2022    Rana Snare

## 2022-09-16 ENCOUNTER — Ambulatory Visit: Payer: BC Managed Care – PPO | Admitting: Physical Medicine and Rehabilitation

## 2022-10-08 ENCOUNTER — Encounter: Payer: Self-pay | Admitting: Occupational Therapy

## 2022-10-08 ENCOUNTER — Ambulatory Visit: Payer: Medicare Other | Attending: Physician Assistant | Admitting: Physical Therapy

## 2022-10-08 ENCOUNTER — Ambulatory Visit: Payer: Medicare Other | Admitting: Occupational Therapy

## 2022-10-08 ENCOUNTER — Encounter: Payer: Self-pay | Admitting: Physical Therapy

## 2022-10-08 DIAGNOSIS — R262 Difficulty in walking, not elsewhere classified: Secondary | ICD-10-CM | POA: Insufficient documentation

## 2022-10-08 DIAGNOSIS — G959 Disease of spinal cord, unspecified: Secondary | ICD-10-CM | POA: Insufficient documentation

## 2022-10-08 DIAGNOSIS — R2681 Unsteadiness on feet: Secondary | ICD-10-CM | POA: Diagnosis present

## 2022-10-08 DIAGNOSIS — G373 Acute transverse myelitis in demyelinating disease of central nervous system: Secondary | ICD-10-CM | POA: Insufficient documentation

## 2022-10-08 DIAGNOSIS — M6281 Muscle weakness (generalized): Secondary | ICD-10-CM | POA: Insufficient documentation

## 2022-10-08 DIAGNOSIS — R293 Abnormal posture: Secondary | ICD-10-CM | POA: Insufficient documentation

## 2022-10-08 DIAGNOSIS — R2689 Other abnormalities of gait and mobility: Secondary | ICD-10-CM | POA: Insufficient documentation

## 2022-10-08 NOTE — Therapy (Signed)
OUTPATIENT PHYSICAL THERAPY NEURO EVALUATION   Patient Name: Maurice Cruz MRN: [id number] DOB:March 31, 1966, 57 y.o., male Today's Date: 10/08/2022   PCP: [facility]) REFERRING PROVIDER: Barbie Banner, PA-C   END OF SESSION:  10/08/22 1320  PT Visits / Re-Eval  Visit Number 1  Number of Visits 9  Date for PT Re-Evaluation 12/13/22 (pushed out due to scheduling delays)  Authorization  Authorization Type MEDICARE PART A AND B  Progress Note Due on Visit 10  PT Time Calculation  PT Start Time 1317  PT Stop Time 1354  PT Time Calculation (min) 37 min  PT - End of Session  Equipment Utilized During Treatment Gait belt  Activity Tolerance Patient tolerated treatment well  Behavior During Therapy WFL for tasks assessed/performed    Past Medical History:  Diagnosis Date   Anxiety    Asthma    as a child   Depression    ED (erectile dysfunction)    GERD (gastroesophageal reflux disease)    Hx of spinal cord injury 03/2020   Hypertension    Neurogenic bladder    self caths   Neurogenic bowel    has to be digitially stimulated - 04/24/21   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[place])    bilateral legs   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[place])    Past Surgical History:  Procedure Laterality Date   Anorectal biospy  02/09/2021   Colon Biospy  02/09/2021   IR ANGIO INTRA EXTRACRAN SEL COM CAROTID INNOMINATE BILAT MOD SED  04/25/2021   IR ANGIO VERTEBRAL SEL SUBCLAVIAN INNOMINATE UNI L MOD SED  04/25/2021   IR ANGIO VERTEBRAL SEL VERTEBRAL UNI R MOD SED  04/25/2021   IR ANGIO/SPINAL LEFT  04/25/2021   IR ANGIO/SPINAL LEFT  04/25/2021   IR ANGIO/SPINAL LEFT  04/25/2021   IR ANGIO/SPINAL LEFT  04/25/2021   IR ANGIO/SPINAL LEFT  04/25/2021   IR ANGIO/SPINAL LEFT  04/25/2021   IR ANGIO/SPINAL LEFT  04/25/2021   IR ANGIO/SPINAL LEFT  04/25/2021   IR ANGIO/SPINAL LEFT  04/25/2021   IR ANGIO/SPINAL LEFT  04/25/2021   IR ANGIO/SPINAL LEFT  04/25/2021   IR ANGIO/SPINAL LEFT  04/25/2021   IR ANGIO/SPINAL  LEFT  04/25/2021   IR ANGIO/SPINAL RIGHT  04/25/2021   IR ANGIO/SPINAL RIGHT  04/25/2021   IR ANGIO/SPINAL RIGHT  04/25/2021   IR ANGIO/SPINAL RIGHT  04/25/2021   IR ANGIO/SPINAL RIGHT  04/25/2021   IR ANGIO/SPINAL RIGHT  04/25/2021   IR ANGIO/SPINAL RIGHT  04/25/2021   IR ANGIO/SPINAL RIGHT  04/25/2021   IR ANGIO/SPINAL RIGHT  04/25/2021   IR ANGIO/SPINAL RIGHT  04/25/2021   IR ANGIO/SPINAL RIGHT  04/25/2021   IR ANGIO/SPINAL RIGHT  04/25/2021   IR ANGIO/SPINAL RIGHT  04/25/2021   IR ANGIO/SPINAL RIGHT  04/25/2021   IR ANGIOGRAM EXTREMITY BILATERAL  04/25/2021   IR RADIOLOGIST EVAL & MGMT  03/21/2021   RADIOLOGY WITH ANESTHESIA N/A 04/25/2021   Procedure: IR WITH ANESTHESIA SPINAL ANGIOGRAM;  Surgeon: Luanne Bras, MD;  Location: [facility] OR;  Service: Radiology;  Laterality: N/A;   Patient Active Problem List   Diagnosis Date Noted   Acute gout of right ankle 09/10/2022   Anemia of chronic disease 09/09/2022   Adjustment disorder with depressed mood 09/04/2022   Debility 08/30/2022   Spinal cord disorder ([place]) 08/29/2022   AKI (acute kidney injury) ([place]) 08/26/2022   Normocytic anemia 08/26/2022   Left leg pain 08/26/2022   Pleural effusion 08/26/2022   Anxiety 08/26/2022   GERD (  gastroesophageal reflux disease) 08/26/2022   Essential hypertension 08/26/2022   Hyperlipidemia 08/26/2022   Leg weakness 08/25/2022   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[place]) 05/21/2021   Anti-RNP antibodies present 05/21/2021   Weakness of both lower extremities 02/05/2021   Carcinoma in situ 01/03/2021   Neurogenic bladder 12/08/2020   Neurogenic bowel 12/08/2020   Paraplegia following spinal cord injury ([place]) 12/08/2020   Wheelchair dependence 12/08/2020   Spasticity 12/08/2020   Chronic bilateral thoracic back pain 12/08/2020    ONSET DATE: 09/09/2022 (referral date)  REFERRING DIAG: R53.81 (ICD-10-CM) - Other malaise  THERAPY DIAG:  Other abnormalities of gait and mobility  Muscle weakness  (generalized)  Unsteadiness on feet  Abnormal posture  Rationale for Evaluation and Treatment: Rehabilitation  SUBJECTIVE:                                                                                                                                                                                             SUBJECTIVE STATEMENT: Pt has had recent set back with hospitalization for kidney issue and left lower lobe pneumonia.  Pt was unable to obtain toe cap in preferred shoes, but states he was instructed to call [name] if he had an additional pair of shoes to try which he does, but has not called back. Pt accompanied by: family member-dad  PERTINENT HISTORY: Transverse myelitis, neurogenic bowel and bladder  From hospital notes:  presented to [facility] ED on 08/25/2022 complaining of several history of BLE weakness, left greater than right. NS and neurology consulted. MRI with chronic changes. Neurology confirmed no TM diagnosis and MRI of complete spine negative for cord compression and stable cord hemorrhage as compared to July 2022. Complained of right ankle pain on 11/27 and started on colchicine for acute gout flare. Also complained of lingering cough and had mild fever to 100.6. Chest x-ray and blood cultures performed. Possible LLL pneumonia.   Admitted to inpatient rehab from 08/29/2022 - 09/10/2022.  Pt was ambulating at CGA level w/ toe cap.  Transferring supervision level. PAIN:  *Pain is same as prior episode of care. Are you having pain? Yes: NPRS scale: 2/10 Pain location: low back Pain description: achy Aggravating factors: activity Relieving factors: resting  PRECAUTIONS: Fall  WEIGHT BEARING RESTRICTIONS: No  FALLS: Has patient fallen in last 6 months? No  LIVING ENVIRONMENT: Lives with:  mother and father Lives in: House/apartment Stairs: Yes: Internal: 12 steps; on right going up and on left going up and External: 1 steps; none, pt has chair lift at home for  stairs. Has following equipment at home: Gilford Rile - 2 wheeled, Wheelchair (manual), and bed side  commode  PLOF: Independent with dressing, self care, transfer, parents help with cooking, cleaning.   PATIENT GOALS: "To get back to where we were when I discharged."  OBJECTIVE:   DIAGNOSTIC FINDINGS: No recent relevant imaging.  COGNITION: Overall cognitive status: Within functional limits for tasks assessed   SENSATION: WFL  COORDINATION: Not formally assessed.  EDEMA:  None noted in BLE.  MUSCLE TONE: Not formally assessed.  POSTURE: rounded shoulders and posterior pelvic tilt  LOWER EXTREMITY ROM:     Active  Right Eval Left Eval  Hip flexion Grossly limited >50% Grossly WFL  Hip extension    Hip abduction    Hip adduction    Hip internal rotation    Hip external rotation    Knee flexion    Knee extension    Ankle dorsiflexion    Ankle plantarflexion    Ankle inversion    Ankle eversion     (Blank rows = not tested)  LOWER EXTREMITY MMT:    MMT Right Eval Left Eval  Hip flexion 1/5 3/5  Hip extension    Hip abduction 1+/5 3+/5  Hip adduction 2+/5 3+/5  Hip internal rotation    Hip external rotation    Knee flexion    Knee extension    Ankle dorsiflexion    Ankle plantarflexion    Ankle inversion    Ankle eversion    (Blank rows = not tested)  BED MOBILITY:  Pt reports no issues.  TRANSFERS: Assistive device utilized: Environmental consultant - 2 wheeled  Sit to stand: SBA Stand to sit: SBA Chair to chair: SBA  GAIT: Gait pattern: step through pattern, decreased stride length, decreased ankle dorsiflexion- Right, decreased ankle dorsiflexion- Left, decreased trunk rotation, and poor foot clearance- Right Distance walked: 120' Assistive device utilized: Environmental consultant - 2 wheeled Level of assistance: SBA Comments: Right toe drag  FUNCTIONAL TESTS:  5 times sit to stand: 47 sec (eval from May 2023), 25.16 sec w/ BUE support (discharge from outpatient Sept 2023),  45.25 sec w/ BUE support (10/08/2022) Timed up and go (TUG): 1 min with RW (eval 2023), 22.81 sec w/ RW (discharge 2023), 1.01 sec w/ RW and no AFO/toe cap (10/08/2022) 10 meter walk test: 0.27 m/s with RW (eval 2023), 0.54 m/sec w/ RW and right AFO (discharge 2023), 1 min and 4 seconds = 0.16 m/sec OR 0.51 ft/sec w/ RW no AFO/toe cap (10/08/2022)  PATIENT SURVEYS:  None completed due to time.  TODAY'S TREATMENT:                                                                                                                              DATE: N/A    PATIENT EDUCATION: Education details: Encouraged pt to call [name] to have them assess [employer] shoe to determine if toe cap can be placed and to bring shoes into session so can practice with simulated toe cap.  PT POC, assessments used and comparisons to prior  episode of care, and goals to be set. Person educated: Patient and Parent Education method: Explanation Education comprehension: verbalized understanding  HOME EXERCISE PROGRAM: To be reviewed and modified from episode prior.  GOALS: Goals reviewed with patient? Yes   SHORT TERM GOALS: Target date: 11/08/2022   Patient will be able to ambulate 500' with RW without needing a rest break to improve functional walking endurance. Baseline: 120' Goal status: INITIAL   2.  Pt will decrease 5xSTS to </=40 seconds w/ BUE support in order to demonstrate decreased risk for falls and improved functional bilateral LE strength and power.  Baseline: 45.25 seconds w/ BUE Goal status: INITIAL   3.  Pt will demonstrate TUG of </=50 seconds in order to decrease risk of falls and improve functional mobility using LRAD. Baseline: 61 seconds w/ RW no AFO/toe cap Goal status: INITIAL  4.  Pt will demonstrate a gait speed of >/=0.6 feet/sec in order to decrease risk for falls. Baseline: 0.51 ft/sec Goal status: INITIAL   LONG TERM GOALS: Target date: 12/06/2022   Patient will be able to ambulate >/=800' with  RW without needing a rest break to improve functional walking endurance and access to community. Baseline: 120' w/ RW no AFO/toe cap Goal status: INITIAL   2.  Pt will decrease 5xSTS to </=35 seconds w/ BUE support in order to demonstrate decreased risk for falls and improved functional bilateral LE strength and power.  Baseline: 45.25 seconds w/ BUE Goal status: INITIAL   3.  Pt will demonstrate TUG of </=40 seconds in order to decrease risk of falls and improve functional mobility using LRAD. Baseline: 61 seconds w/ RW no AFO/toe cap Goal status: INITIAL  4.  Pt will demonstrate a gait speed of >/=0.7 feet/sec using LRAD in order to decrease risk for falls. Baseline: 0.51 ft/sec Goal status: INITIAL    ASSESSMENT:  CLINICAL IMPRESSION: Patient is a 57 y.o. male who was seen today for physical therapy evaluation and treatment for regression of functional performance following recent hospital admission for kidney function and left lower lobe pneumonia.  Pt has a significant PMH of possible transverse myelitis w/ neurogenic bowel and bladder.  Identified impairments include decreased right LE clearance with toe drag, ongoing low back pain, and right weakness worse than left.  Evaluation via the following assessment tools: 5xSTS, TUG, and 10MWT indicate fall risk.  They would benefit from skilled PT to address impairments as noted and progress towards long term goals.  OBJECTIVE IMPAIRMENTS: Abnormal gait, decreased activity tolerance, decreased balance, decreased endurance, decreased mobility, difficulty walking, decreased ROM, decreased strength, improper body mechanics, and postural dysfunction.   ACTIVITY LIMITATIONS: carrying, lifting, bending, squatting, stairs, transfers, and locomotion level  PARTICIPATION LIMITATIONS: meal prep, cleaning, laundry, driving, community activity, and occupation  PERSONAL FACTORS: Age, Fitness, Past/current experiences, Time since onset of  injury/illness/exacerbation, Transportation, and 1 comorbidity: ongoing question of transverse myelitis vs other spinal cord pathology  are also affecting patient's functional outcome.   REHAB POTENTIAL: Good  CLINICAL DECISION MAKING: Evolving/moderate complexity  EVALUATION COMPLEXITY: Moderate  PLAN:  PT FREQUENCY: 1x/week  PT DURATION: 8 weeks  PLANNED INTERVENTIONS: Therapeutic exercises, Therapeutic activity, Neuromuscular re-education, Balance training, Gait training, Patient/Family education, Self Care, Stair training, Vestibular training, Orthotic/Fit training, DME instructions, Manual therapy, and Re-evaluation  PLAN FOR NEXT SESSION: Review and update HEP from prior episode of care, R strengthening/NMR, static unsupported balance, toe cap vs existing AFO gait training, endurance-SciFit   Bary Richard, PT, DPT 10/08/2022,  1:54 PM

## 2022-10-08 NOTE — Therapy (Signed)
OUTPATIENT OCCUPATIONAL THERAPY NEURO EVALUATION  Patient Name: Maurice Cruz MRN: [id number] DOB:Feb 25, 1966, 57 y.o., male Today's Date: 10/08/2022  PCP: [facility] PROVIDER: Barbie Banner, PA-C  END OF SESSION:  OT End of Session - 10/08/22 1441     Visit Number 1    Number of Visits 1    Authorization Type Medicare primary; BCBS secondary    OT Start Time 1239    OT Stop Time 1316    OT Time Calculation (min) 37 min    Activity Tolerance Patient tolerated treatment well    Behavior During Therapy WFL for tasks assessed/performed             Past Medical History:  Diagnosis Date   Anxiety    Asthma    as a child   Depression    ED (erectile dysfunction)    GERD (gastroesophageal reflux disease)    Hx of spinal cord injury 03/2020   Hypertension    Neurogenic bladder    self caths   Neurogenic bowel    has to be digitially stimulated - 04/24/21   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[place])    bilateral legs   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[place])    Past Surgical History:  Procedure Laterality Date   Anorectal biospy  02/09/2021   Colon Biospy  02/09/2021   IR ANGIO INTRA EXTRACRAN SEL COM CAROTID INNOMINATE BILAT MOD SED  04/25/2021   IR ANGIO VERTEBRAL SEL SUBCLAVIAN INNOMINATE UNI L MOD SED  04/25/2021   IR ANGIO VERTEBRAL SEL VERTEBRAL UNI R MOD SED  04/25/2021   IR ANGIO/SPINAL LEFT  04/25/2021   IR ANGIO/SPINAL LEFT  04/25/2021   IR ANGIO/SPINAL LEFT  04/25/2021   IR ANGIO/SPINAL LEFT  04/25/2021   IR ANGIO/SPINAL LEFT  04/25/2021   IR ANGIO/SPINAL LEFT  04/25/2021   IR ANGIO/SPINAL LEFT  04/25/2021   IR ANGIO/SPINAL LEFT  04/25/2021   IR ANGIO/SPINAL LEFT  04/25/2021   IR ANGIO/SPINAL LEFT  04/25/2021   IR ANGIO/SPINAL LEFT  04/25/2021   IR ANGIO/SPINAL LEFT  04/25/2021   IR ANGIO/SPINAL LEFT  04/25/2021   IR ANGIO/SPINAL RIGHT  04/25/2021   IR ANGIO/SPINAL RIGHT  04/25/2021   IR ANGIO/SPINAL RIGHT  04/25/2021   IR ANGIO/SPINAL RIGHT  04/25/2021   IR ANGIO/SPINAL RIGHT  04/25/2021    IR ANGIO/SPINAL RIGHT  04/25/2021   IR ANGIO/SPINAL RIGHT  04/25/2021   IR ANGIO/SPINAL RIGHT  04/25/2021   IR ANGIO/SPINAL RIGHT  04/25/2021   IR ANGIO/SPINAL RIGHT  04/25/2021   IR ANGIO/SPINAL RIGHT  04/25/2021   IR ANGIO/SPINAL RIGHT  04/25/2021   IR ANGIO/SPINAL RIGHT  04/25/2021   IR ANGIO/SPINAL RIGHT  04/25/2021   IR ANGIOGRAM EXTREMITY BILATERAL  04/25/2021   IR RADIOLOGIST EVAL & MGMT  03/21/2021   RADIOLOGY WITH ANESTHESIA N/A 04/25/2021   Procedure: IR WITH ANESTHESIA SPINAL ANGIOGRAM;  Surgeon: Luanne Bras, MD;  Location: [facility] [place];  Service: Radiology;  Laterality: N/A;   Patient Active Problem List   Diagnosis Date Noted   Acute gout of right ankle 09/10/2022   Anemia of chronic disease 09/09/2022   Adjustment disorder with depressed mood 09/04/2022   Debility 08/30/2022   Spinal cord disorder ([place]) 08/29/2022   AKI (acute kidney injury) ([place]) 08/26/2022   Normocytic anemia 08/26/2022   Left leg pain 08/26/2022   Pleural effusion 08/26/2022   Anxiety 08/26/2022   GERD (gastroesophageal reflux disease) 08/26/2022   Essential hypertension 08/26/2022   Hyperlipidemia 08/26/2022   Leg weakness 08/25/2022  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[place]) 05/21/2021   Anti-RNP antibodies present 05/21/2021   Weakness of both lower extremities 02/05/2021   Carcinoma in situ 01/03/2021   Neurogenic bladder 12/08/2020   Neurogenic bowel 12/08/2020   Paraplegia following spinal cord injury ([place]) 12/08/2020   Wheelchair dependence 12/08/2020   Spasticity 12/08/2020   Chronic bilateral thoracic back pain 12/08/2020    ONSET DATE: 09/09/2022 (date of referral)  REFERRING DIAG: R53.81 (ICD-10-CM) - Other malaise  THERAPY DIAG:  Spinal cord disorder (HCC)  Muscle weakness (generalized)  Rationale for Evaluation and Treatment: Rehabilitation  SUBJECTIVE:   SUBJECTIVE STATEMENT: He feels that other than walking he is doing well and may not need OT.   Pt accompanied by:   father  PERTINENT HISTORY: Brief HPI:   Maurice Cruz is a 57 y.o. male with a history of spinal cord hematoma in 2022. Initially diagnosed while living in [place] in 2020 with BLE weakness. 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 He presented to [facility] ED on 08/25/2022 complaining of several history of BLE weakness, left greater than right. NS and neurology consulted. MRI with chronic changes. Neurology confirmed no TM diagnosis  and MRI of complete spine negative for cord compression and stable cord hemorrhage as compared to July 2022. The patient is followed as outpatient with Dr. [name]. AKI atop CKD noted. Lab work-up, LE venous duplex performed. Complained of right ankle pain on 11/27 and started on colchicine for acute gout flare.  PRECAUTIONS: Fall  WEIGHT BEARING RESTRICTIONS: No  PAIN:  Are you having pain? Yes: NPRS scale: 2/10 Pain location: low back Pain description: achey Aggravating factors: unknown Relieving factors: rest; medications  FALLS: Has patient fallen in last 6 months? No  LIVING ENVIRONMENT: Lives with: lives with their family (parents) Lives in: House/apartment Stairs:  3 level home with 1 STE; uses stairlift Has following equipment at home: Environmental consultant - 2 wheeled, Wheelchair (manual), bed side commode, and Bedside commode, stairlift  PLOF: Independent; working for [employer]; driving; living alone  PATIENT GOALS: return to [employer]  OBJECTIVE:   HAND DOMINANCE: Right  ADLs: Overall ADLs: mod I  IADLs: Shopping: SBA using cart Light housekeeping: dependent Meal Prep: mod I for light meal prep Community mobility: dependent Medication management: independent Doctor, hospital: independent Handwriting: 100% legible  MOBILITY STATUS: Needs Assist: CGA with use of RW  ACTIVITY TOLERANCE: Activity tolerance: fair  FUNCTIONAL OUTCOME MEASURES:   UPPER EXTREMITY ROM:    WNL  UPPER EXTREMITY MMT:      WNL  HAND FUNCTION: Grip strength: Right: 86.4 lbs; Left: 82.2 lbs  COORDINATION: 9 Hole Peg test: Right: 25 sec; Left: 28 sec  SENSATION: WFL  EDEMA: None  MUSCLE TONE: WNL  COGNITION: Overall cognitive status: Within functional limits for tasks assessed  VISION: Subjective report: no changes Baseline vision: Wears glasses for reading only  PERCEPTION: WFL  PRAXIS: WFL  OBSERVATIONS: Pt ambulates from lobby to treatment room with CGA and use of RW. Appears well-kept. Good, volitional use of BUE.    TODAY'S TREATMENT:  N/a  PATIENT EDUCATION: Education details: OT POC Person educated: Patient and Parent Education method: Explanation Education comprehension: verbalized understanding  HOME EXERCISE PROGRAM: N/a   GOALS: N/A  ASSESSMENT:  CLINICAL IMPRESSION: Patient is a 57 y.o. male who was seen today for occupational therapy evaluation secondary to malaise. Hx includes anxiety, asthma, depression, SCI, HTN, paraparesis, and transverse myelitis. Patient demonstrates WNL BUE ROM, strength, and coordination as needed to complete ADLs and IADLs independently and safely. He remains most limited by mobility at this point. Patient needs addressed at eval. No further need for skilled OT services at this time.   PERFORMANCE DEFICITS: in functional skills including IADLs, mobility, and balance.  IMPAIRMENTS: are limiting patient from IADLs and work.   CO-MORBIDITIES: may have co-morbidities  that affects occupational performance. Patient will benefit from skilled OT to address above impairments and improve overall function.  MODIFICATION OR ASSISTANCE TO COMPLETE EVALUATION: No modification of tasks or assist necessary to complete an evaluation.  OT OCCUPATIONAL PROFILE AND HISTORY: Problem focused assessment: Including review of records  relating to presenting problem.  CLINICAL DECISION MAKING: LOW - limited treatment options, no task modification necessary  REHAB POTENTIAL: Excellent  EVALUATION COMPLEXITY: Low    PLAN:  OT DURATION: other: evaluation only  RECOMMENDED OTHER SERVICES: none at this time  CONSULTED AND AGREED WITH PLAN OF CARE: Patient and family member/caregiver   Dennis Bast, OT 10/08/2022, 2:42 PM

## 2022-10-11 NOTE — Progress Notes (Signed)
   10/08/22 1320  PT Visits / Re-Eval  Visit Number 1  Number of Visits 9  Date for PT Re-Evaluation 12/13/22 (pushed out due to scheduling delays)  Authorization  Authorization Type MEDICARE PART A AND B  Progress Note Due on Visit 10  PT Time Calculation  PT Start Time 1317  PT Stop Time 1354  PT Time Calculation (min) 37 min  PT - End of Session  Equipment Utilized During Treatment Gait belt  Activity Tolerance Patient tolerated treatment well  Behavior During Therapy [facility] for tasks assessed/performed

## 2022-10-14 ENCOUNTER — Ambulatory Visit: Payer: Medicare Other | Admitting: Physical Therapy

## 2022-10-21 ENCOUNTER — Ambulatory Visit: Payer: Medicare Other | Admitting: Physical Therapy

## 2022-10-21 DIAGNOSIS — R293 Abnormal posture: Secondary | ICD-10-CM

## 2022-10-21 DIAGNOSIS — R262 Difficulty in walking, not elsewhere classified: Secondary | ICD-10-CM

## 2022-10-21 DIAGNOSIS — M6281 Muscle weakness (generalized): Secondary | ICD-10-CM

## 2022-10-21 DIAGNOSIS — G373 Acute transverse myelitis in demyelinating disease of central nervous system: Secondary | ICD-10-CM

## 2022-10-21 DIAGNOSIS — R2681 Unsteadiness on feet: Secondary | ICD-10-CM

## 2022-10-21 DIAGNOSIS — R2689 Other abnormalities of gait and mobility: Secondary | ICD-10-CM | POA: Diagnosis not present

## 2022-10-21 NOTE — Therapy (Signed)
OUTPATIENT PHYSICAL THERAPY NEURO TREATMENT   Patient Name: Maurice Cruz MRN: [id number] DOB:Mar 22, 1966, 57 y.o., male Today's Date: 10/21/2022   PCP: [facility]) REFERRING PROVIDER: Barbie Banner, PA-C   END OF SESSION:  PT End of Session - 10/21/22 1455     Visit Number 2    Number of Visits 9    Date for PT Re-Evaluation 12/13/22   pushed out due to scheduling delays   Authorization Type MEDICARE PART A AND B    Progress Note Due on Visit 10    PT Start Time 1450   this therapist ran over with previous patient   PT Stop Time 1531    PT Time Calculation (min) 41 min    Equipment Utilized During Treatment --    Activity Tolerance Patient tolerated treatment well    Behavior During Therapy WFL for tasks assessed/performed             Past Medical History:  Diagnosis Date   Anxiety    Asthma    as a child   Depression    ED (erectile dysfunction)    GERD (gastroesophageal reflux disease)    Hx of spinal cord injury 03/2020   Hypertension    Neurogenic bladder    self caths   Neurogenic bowel    has to be digitially stimulated - 04/24/21   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[place])    bilateral legs   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[employer])    Past Surgical History:  Procedure Laterality Date   Anorectal biospy  02/09/2021   Colon Biospy  02/09/2021   IR ANGIO INTRA EXTRACRAN SEL COM CAROTID INNOMINATE BILAT MOD SED  04/25/2021   IR ANGIO VERTEBRAL SEL SUBCLAVIAN INNOMINATE UNI L MOD SED  04/25/2021   IR ANGIO VERTEBRAL SEL VERTEBRAL UNI R MOD SED  04/25/2021   IR ANGIO/SPINAL LEFT  04/25/2021   IR ANGIO/SPINAL LEFT  04/25/2021   IR ANGIO/SPINAL LEFT  04/25/2021   IR ANGIO/SPINAL LEFT  04/25/2021   IR ANGIO/SPINAL LEFT  04/25/2021   IR ANGIO/SPINAL LEFT  04/25/2021   IR ANGIO/SPINAL LEFT  04/25/2021   IR ANGIO/SPINAL LEFT  04/25/2021   IR ANGIO/SPINAL LEFT  04/25/2021   IR ANGIO/SPINAL LEFT  04/25/2021   IR ANGIO/SPINAL LEFT  04/25/2021   IR ANGIO/SPINAL LEFT  04/25/2021    IR ANGIO/SPINAL LEFT  04/25/2021   IR ANGIO/SPINAL RIGHT  04/25/2021   IR ANGIO/SPINAL RIGHT  04/25/2021   IR ANGIO/SPINAL RIGHT  04/25/2021   IR ANGIO/SPINAL RIGHT  04/25/2021   IR ANGIO/SPINAL RIGHT  04/25/2021   IR ANGIO/SPINAL RIGHT  04/25/2021   IR ANGIO/SPINAL RIGHT  04/25/2021   IR ANGIO/SPINAL RIGHT  04/25/2021   IR ANGIO/SPINAL RIGHT  04/25/2021   IR ANGIO/SPINAL RIGHT  04/25/2021   IR ANGIO/SPINAL RIGHT  04/25/2021   IR ANGIO/SPINAL RIGHT  04/25/2021   IR ANGIO/SPINAL RIGHT  04/25/2021   IR ANGIO/SPINAL RIGHT  04/25/2021   IR ANGIOGRAM EXTREMITY BILATERAL  04/25/2021   IR RADIOLOGIST EVAL & MGMT  03/21/2021   RADIOLOGY WITH ANESTHESIA N/A 04/25/2021   Procedure: IR WITH ANESTHESIA SPINAL ANGIOGRAM;  Surgeon: Luanne Bras, MD;  Location: [facility];  Service: Radiology;  Laterality: N/A;   Patient Active Problem List   Diagnosis Date Noted   Acute gout of right ankle 09/10/2022   Anemia of chronic disease 09/09/2022   Adjustment disorder with depressed mood 09/04/2022   Debility 08/30/2022   Spinal cord disorder ([place]) 08/29/2022   AKI (  acute kidney injury) ([place]) 08/26/2022   Normocytic anemia 08/26/2022   Left leg pain 08/26/2022   Pleural effusion 08/26/2022   Anxiety 08/26/2022   GERD (gastroesophageal reflux disease) 08/26/2022   Essential hypertension 08/26/2022   Hyperlipidemia 08/26/2022   Leg weakness 08/25/2022   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[place]) 05/21/2021   Anti-RNP antibodies present 05/21/2021   Weakness of both lower extremities 02/05/2021   Carcinoma in situ 01/03/2021   Neurogenic bladder 12/08/2020   Neurogenic bowel 12/08/2020   Paraplegia following spinal cord injury ([place]) 12/08/2020   Wheelchair dependence 12/08/2020   Spasticity 12/08/2020   Chronic bilateral thoracic back pain 12/08/2020    ONSET DATE: 09/09/2022 (referral date)  REFERRING DIAG: R53.81 (ICD-10-CM) - Other malaise  THERAPY DIAG:  Muscle weakness (generalized)  Other abnormalities of gait  and mobility  Unsteadiness on feet  Abnormal posture  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HCC)  Difficulty in walking, not elsewhere classified  Rationale for Evaluation and Treatment: Rehabilitation  SUBJECTIVE:                                                                                                                                                                                             SUBJECTIVE STATEMENT: Pt reports chronic back pain, stomach slightly off today. No acute changes since last visit.  Pt accompanied by: self  PERTINENT HISTORY: Transverse myelitis, neurogenic bowel and bladder  From hospital notes:  presented to [facility] ED on 08/25/2022 complaining of several history of BLE weakness, left greater than right. NS and neurology consulted. MRI with chronic changes. Neurology confirmed no TM diagnosis and MRI of complete spine negative for cord compression and stable cord hemorrhage as compared to July 2022. Complained of right ankle pain on 11/27 and started on colchicine for acute gout flare. Also complained of lingering cough and had mild fever to 100.6. Chest x-ray and blood cultures performed. Possible LLL pneumonia.   Admitted to inpatient rehab from 08/29/2022 - 09/10/2022.  Pt was ambulating at CGA level w/ toe cap.  Transferring supervision level. PAIN:  *Pain is same as prior episode of care. Are you having pain? Yes: NPRS scale: 2/10 Pain location: low back Pain description: achy Aggravating factors: activity Relieving factors: resting  PRECAUTIONS: Fall  WEIGHT BEARING RESTRICTIONS: No  FALLS: Has patient fallen in last 6 months? No  LIVING ENVIRONMENT: Lives with:  mother and father Lives in: House/apartment Stairs: Yes: Internal: 12 steps; on right going up and on left going up and External: 1 steps; none, pt has chair lift at home for stairs. Has following equipment at home: Gilford Rile - 2 wheeled, Wheelchair (manual), and bed side  commode  PLOF: Independent  with dressing, self care, transfer, parents help with cooking, cleaning.   PATIENT GOALS: "To get back to where we were when I discharged."  OBJECTIVE:  Measures assessed at initial evaluation unless otherwise noted:  LOWER EXTREMITY MMT:    MMT Right Eval Left Eval  Hip flexion 1/5 3/5  Hip extension    Hip abduction 1+/5 3+/5  Hip adduction 2+/5 3+/5  Hip internal rotation    Hip external rotation    Knee flexion    Knee extension    Ankle dorsiflexion    Ankle plantarflexion    Ankle inversion    Ankle eversion    (Blank rows = not tested)   TODAY'S TREATMENT:                                                                                                                              GAIT: Gait pattern: decreased hip/knee flexion- Right, decreased ankle dorsiflexion- Right, and poor foot clearance- Right Distance walked: various clinic distances Assistive device utilized: Walker - 2 wheeled Level of assistance: Modified independence Comments: entering/exiting clinic this date  Gait pattern: decreased hip/knee flexion- Right and decreased ankle dorsiflexion- Right Distance walked: 115 ft Assistive device utilized: Walker - 2 wheeled Level of assistance: Modified independence Comments: trial gait with toe cap, improved RLE clearance  Encouraged pt to bring these tennis shoes next session as well as his AFO and tennis shoes he uses with his AFO next session to compare gait with various bracing options before sending pt to [name].   THER EX: Reviewed HEP handout from hospital and from previous OPPT episode of care, adjusted HEP and provided new handout. See bolded new HEP handout.  PATIENT EDUCATION: Education details: new HEP Person educated: Patient Education method: Theatre stage manager Education comprehension: verbalized understanding  HOME EXERCISE PROGRAM: To be reviewed and modified from episode prior.  Access Code: [email or web address] URL:  [email or web address] Date: 10/21/2022 Prepared by: Excell Seltzer  Exercises - Supine Heel Slide with Strap  - 1 x daily - 7 x weekly - 3 sets - 10 reps - Clamshell  - 1 x daily - 7 x weekly - 3 sets - 10 reps - Sit to Stand Without Arm Support  - 1 x daily - 7 x weekly - 3 sets - 10 reps - Side Stepping with Counter Support  - 1 x daily - 7 x weekly - 3 sets - 10 reps - Standing March with Counter Support  - 1 x daily - 7 x weekly - 3 sets - 10 reps - Standing Hip Abduction with Counter Support  - 1 x daily - 7 x weekly - 3 sets - 10 reps  GOALS: Goals reviewed with patient? Yes   SHORT TERM GOALS: Target date: 11/08/2022   Patient will be able to ambulate 500' with RW without needing a rest break to improve functional walking endurance. Baseline: 120' Goal status: INITIAL  2.  Pt will decrease 5xSTS to </=40 seconds w/ BUE support in order to demonstrate decreased risk for falls and improved functional bilateral LE strength and power.  Baseline: 45.25 seconds w/ BUE Goal status: INITIAL   3.  Pt will demonstrate TUG of </=50 seconds in order to decrease risk of falls and improve functional mobility using LRAD. Baseline: 61 seconds w/ RW no AFO/toe cap Goal status: INITIAL  4.  Pt will demonstrate a gait speed of >/=0.6 feet/sec in order to decrease risk for falls. Baseline: 0.51 ft/sec Goal status: INITIAL   LONG TERM GOALS: Target date: 12/06/2022   Patient will be able to ambulate >/=800' with RW without needing a rest break to improve functional walking endurance and access to community. Baseline: 120' w/ RW no AFO/toe cap Goal status: INITIAL   2.  Pt will decrease 5xSTS to </=35 seconds w/ BUE support in order to demonstrate decreased risk for falls and improved functional bilateral LE strength and power.  Baseline: 45.25 seconds w/ BUE Goal status: INITIAL   3.  Pt will demonstrate TUG of </=40 seconds in order to decrease risk of falls and improve  functional mobility using LRAD. Baseline: 61 seconds w/ RW no AFO/toe cap Goal status: INITIAL  4.  Pt will demonstrate a gait speed of >/=0.7 feet/sec using LRAD in order to decrease risk for falls. Baseline: 0.51 ft/sec Goal status: INITIAL    ASSESSMENT:  CLINICAL IMPRESSION: Emphasis of skilled PT session on assessing gait with toe cap vs no bracing/shoe adjustment and adjusting HEP. Pt exhibits improved RLE clearance during gait with use of toe cap, will further assess gait and AFO use next session. Pt continues to exhibit RLE weakness including decreased hip flexor, hip abductor and knee flexor strength and decreased DF strength leading to gait deviations. Adjusted HEP to target current strength deficits. Continue POC.   OBJECTIVE IMPAIRMENTS: Abnormal gait, decreased activity tolerance, decreased balance, decreased endurance, decreased mobility, difficulty walking, decreased ROM, decreased strength, improper body mechanics, and postural dysfunction.   ACTIVITY LIMITATIONS: carrying, lifting, bending, squatting, stairs, transfers, and locomotion level  PARTICIPATION LIMITATIONS: meal prep, cleaning, laundry, driving, community activity, and occupation  PERSONAL FACTORS: Age, Fitness, Past/current experiences, Time since onset of injury/illness/exacerbation, Transportation, and 1 comorbidity: ongoing question of transverse myelitis vs other spinal cord pathology  are also affecting patient's functional outcome.   REHAB POTENTIAL: Good  CLINICAL DECISION MAKING: Evolving/moderate complexity  EVALUATION COMPLEXITY: Moderate  PLAN:  PT FREQUENCY: 1x/week  PT DURATION: 8 weeks  PLANNED INTERVENTIONS: Therapeutic exercises, Therapeutic activity, Neuromuscular re-education, Balance training, Gait training, Patient/Family education, Self Care, Stair training, Vestibular training, Orthotic/Fit training, DME instructions, Manual therapy, and Re-evaluation  PLAN FOR NEXT SESSION:  Review HEP, R strengthening/NMR, static unsupported balance, toe cap vs existing AFO gait training, endurance-SciFit   Excell Seltzer, PT, DPT, CSRS 10/21/2022, 3:32 PM

## 2022-10-28 ENCOUNTER — Ambulatory Visit: Payer: Medicare Other | Admitting: Physical Therapy

## 2022-10-31 ENCOUNTER — Ambulatory Visit: Payer: Medicare Other | Attending: Physician Assistant | Admitting: Physical Therapy

## 2022-10-31 DIAGNOSIS — R293 Abnormal posture: Secondary | ICD-10-CM

## 2022-10-31 DIAGNOSIS — R2681 Unsteadiness on feet: Secondary | ICD-10-CM | POA: Diagnosis present

## 2022-10-31 DIAGNOSIS — G373 Acute transverse myelitis in demyelinating disease of central nervous system: Secondary | ICD-10-CM | POA: Diagnosis present

## 2022-10-31 DIAGNOSIS — R2689 Other abnormalities of gait and mobility: Secondary | ICD-10-CM

## 2022-10-31 DIAGNOSIS — R262 Difficulty in walking, not elsewhere classified: Secondary | ICD-10-CM | POA: Diagnosis present

## 2022-10-31 DIAGNOSIS — M6281 Muscle weakness (generalized): Secondary | ICD-10-CM

## 2022-10-31 NOTE — Therapy (Signed)
OUTPATIENT PHYSICAL THERAPY NEURO TREATMENT   Patient Name: Maurice Cruz MRN: [id number] DOB:April 28, 1966, 57 y.o., male Today's Date: 10/31/2022   PCP: Clayborne Dana [facility]) REFERRING PROVIDER: Barbie Banner, PA-C   END OF SESSION:  PT End of Session - 10/31/22 1313     Visit Number 3    Number of Visits 9    Date for PT Re-Evaluation 12/13/22   pushed out due to scheduling delays   Authorization Type MEDICARE PART A AND B    Progress Note Due on Visit 10    PT Start Time 1310    PT Stop Time 1358    PT Time Calculation (min) 48 min    Equipment Utilized During Treatment Gait belt    Activity Tolerance Patient tolerated treatment well    Behavior During Therapy WFL for tasks assessed/performed              Past Medical History:  Diagnosis Date   Anxiety    Asthma    as a child   Depression    ED (erectile dysfunction)    GERD (gastroesophageal reflux disease)    Hx of spinal cord injury 03/2020   Hypertension    Neurogenic bladder    self caths   Neurogenic bowel    has to be digitially stimulated - 04/24/21   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[place])    bilateral legs   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[place])    Past Surgical History:  Procedure Laterality Date   Anorectal biospy  02/09/2021   Colon Biospy  02/09/2021   IR ANGIO INTRA EXTRACRAN SEL COM CAROTID INNOMINATE BILAT MOD SED  04/25/2021   IR ANGIO VERTEBRAL SEL SUBCLAVIAN INNOMINATE UNI L MOD SED  04/25/2021   IR ANGIO VERTEBRAL SEL VERTEBRAL UNI R MOD SED  04/25/2021   IR ANGIO/SPINAL LEFT  04/25/2021   IR ANGIO/SPINAL LEFT  04/25/2021   IR ANGIO/SPINAL LEFT  04/25/2021   IR ANGIO/SPINAL LEFT  04/25/2021   IR ANGIO/SPINAL LEFT  04/25/2021   IR ANGIO/SPINAL LEFT  04/25/2021   IR ANGIO/SPINAL LEFT  04/25/2021   IR ANGIO/SPINAL LEFT  04/25/2021   IR ANGIO/SPINAL LEFT  04/25/2021   IR ANGIO/SPINAL LEFT  04/25/2021   IR ANGIO/SPINAL LEFT  04/25/2021   IR ANGIO/SPINAL LEFT  04/25/2021   IR ANGIO/SPINAL LEFT  04/25/2021   IR  ANGIO/SPINAL RIGHT  04/25/2021   IR ANGIO/SPINAL RIGHT  04/25/2021   IR ANGIO/SPINAL RIGHT  04/25/2021   IR ANGIO/SPINAL RIGHT  04/25/2021   IR ANGIO/SPINAL RIGHT  04/25/2021   IR ANGIO/SPINAL RIGHT  04/25/2021   IR ANGIO/SPINAL RIGHT  04/25/2021   IR ANGIO/SPINAL RIGHT  04/25/2021   IR ANGIO/SPINAL RIGHT  04/25/2021   IR ANGIO/SPINAL RIGHT  04/25/2021   IR ANGIO/SPINAL RIGHT  04/25/2021   IR ANGIO/SPINAL RIGHT  04/25/2021   IR ANGIO/SPINAL RIGHT  04/25/2021   IR ANGIO/SPINAL RIGHT  04/25/2021   IR ANGIOGRAM EXTREMITY BILATERAL  04/25/2021   IR RADIOLOGIST EVAL & MGMT  03/21/2021   RADIOLOGY WITH ANESTHESIA N/A 04/25/2021   Procedure: IR WITH ANESTHESIA SPINAL ANGIOGRAM;  Surgeon: Luanne Bras, MD;  Location: [place];  Service: Radiology;  Laterality: N/A;   Patient Active Problem List   Diagnosis Date Noted   Acute gout of right ankle 09/10/2022   Anemia of chronic disease 09/09/2022   Adjustment disorder with depressed mood 09/04/2022   Debility 08/30/2022   Spinal cord disorder ([place]) 08/29/2022   AKI (acute kidney injury) ([place]) 08/26/2022  Normocytic anemia 08/26/2022   Left leg pain 08/26/2022   Pleural effusion 08/26/2022   Anxiety 08/26/2022   GERD (gastroesophageal reflux disease) 08/26/2022   Essential hypertension 08/26/2022   Hyperlipidemia 08/26/2022   Leg weakness 08/25/2022   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[place]) 05/21/2021   Anti-RNP antibodies present 05/21/2021   Weakness of both lower extremities 02/05/2021   Carcinoma in situ 01/03/2021   Neurogenic bladder 12/08/2020   Neurogenic bowel 12/08/2020   Paraplegia following spinal cord injury ([place]) 12/08/2020   Wheelchair dependence 12/08/2020   Spasticity 12/08/2020   Chronic bilateral thoracic back pain 12/08/2020    ONSET DATE: 09/09/2022 (referral date)  REFERRING DIAG: R53.81 (ICD-10-CM) - Other malaise  THERAPY DIAG:  Muscle weakness (generalized)  Other abnormalities of gait and mobility  Unsteadiness on  feet  Abnormal posture  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[facility])  Difficulty in walking, not elsewhere classified  Rationale for Evaluation and Treatment: Rehabilitation  SUBJECTIVE:                                                                                                                                                                                             SUBJECTIVE STATEMENT: No falls or other acute changes since last vision. Ongoing chronic back pain. HEP is going well and remains a good challenge, no questions over it. Pt also brings his AFO to this therapy session, reports that he wears it when he is walking with shoes but not when walking barefoot at home.  Pt accompanied by: self, dad  PERTINENT HISTORY: Transverse myelitis, neurogenic bowel and bladder  From hospital notes:  presented to [facility] ED on [facility]22 complaining of several history of BLE weakness, left greater than right. NS and neurology consulted. MRI with chronic changes. Neurology confirmed no TM diagnosis and MRI of complete spine negative for cord compression and stable cord hemorrhage as compared to July 2022. Complained of right ankle pain on 11/27 and started on colchicine for acute gout flare. Also complained of lingering cough and had mild fever to 100.6. Chest x-ray and blood cultures performed. Possible LLL pneumonia.   Admitted to inpatient rehab from 08/29/2022 - 09/10/2022.  Pt was ambulating at CGA level w/ toe cap.  Transferring supervision level. PAIN:  *Pain is same as prior episode of care. Are you having pain? Yes: NPRS scale: 2/10 Pain location: low back Pain description: achy Aggravating factors: activity Relieving factors: resting  PRECAUTIONS: Fall  WEIGHT BEARING RESTRICTIONS: No  FALLS: Has patient fallen in last 6 months? No  LIVING ENVIRONMENT: Lives with:  mother and father Lives in: House/apartment Stairs: Yes: Internal: 12 steps; on right going up  and on left going up and External: 1  steps; none, pt has chair lift at home for stairs. Has following equipment at home: Gilford Rile - 2 wheeled, Wheelchair (manual), and bed side commode  PLOF: Independent with dressing, self care, transfer, parents help with cooking, cleaning.   PATIENT GOALS: "To get back to where we were when I discharged."  OBJECTIVE:  Measures assessed at initial evaluation unless otherwise noted:  LOWER EXTREMITY MMT:    MMT Right Eval Left Eval  Hip flexion 1/5 3/5  Hip extension    Hip abduction 1+/5 3+/5  Hip adduction 2+/5 3+/5  Hip internal rotation    Hip external rotation    Knee flexion    Knee extension    Ankle dorsiflexion    Ankle plantarflexion    Ankle inversion    Ankle eversion    (Blank rows = not tested)   TODAY'S TREATMENT:                                                                                                                              GAIT: Gait pattern: decreased hip/knee flexion- Right, decreased ankle dorsiflexion- Right, and circumduction- Right Distance walked: 115 ft Assistive device utilized: Walker - 2 wheeled Level of assistance: Modified independence Comments: trial gait no AFO  Gait pattern:  R foot catching in floor and occasionally scooting to advance limb, decreased hip/knee flexion- Right, and genu recurvatum- Right Distance walked: 115 ft Assistive device utilized: Walker - 2 wheeled Level of assistance: Modified independence Comments: trial gait with pt's R PLS AFO (has had brace about a year)  Gait pattern: decreased hip/knee flexion- Right, decreased ankle dorsiflexion- Right, and genu recurvatum- Right Distance walked: 115 ft Assistive device utilized: Walker - 2 wheeled Level of assistance: Modified independence Comments: added in shoe cover for toe cap, improved RLE clearance but does still scoot foot along the floor  Gait pattern: decreased hip/knee flexion- Right, decreased ankle dorsiflexion- Right, and genu recurvatum-  Right Distance walked: 115 ft Assistive device utilized: Walker - 2 wheeled Level of assistance: Modified independence Comments: trial gait with several various height wedges to decrease knee hyperextension, no correct noted with use of wedge  Based on assessments this session recommend pt reach out to [name] to have toe cap placed on his shoes, no other recommended modifications at this time.  THER ACT: Static standing balance with no UE support and CGA to min A performing ball toss at rebounder with 1 kg weighted ball  Normal stance x 15 reps Romberg stance 2 x 15 reps Modified tandem stance L/R 2 x 15 reps each   THER EX: SciFit multi-peaks level 2 for 8 minutes using BUE/BLEs for neural priming for reciprocal movement, dynamic cardiovascular warmup and increased amplitude of stepping. First half of exercise performed with BLE only, transitioned to addition of UE due to onset of fatigue. RPE of 5/10 following activity    PATIENT EDUCATION: Education details:  continue HEP, reach out to [name] for toe cap on R shoe Person educated: Patient Education method: Explanation Education comprehension: verbalized understanding  HOME EXERCISE PROGRAM: Access Code: [email or web address] URL: [email or web address] Date: 10/21/2022 Prepared by: Excell Seltzer  Exercises - Supine Heel Slide with Strap  - 1 x daily - 7 x weekly - 3 sets - 10 reps - Clamshell  - 1 x daily - 7 x weekly - 3 sets - 10 reps - Sit to Stand Without Arm Support  - 1 x daily - 7 x weekly - 3 sets - 10 reps - Side Stepping with Counter Support  - 1 x daily - 7 x weekly - 3 sets - 10 reps - Standing March with Counter Support  - 1 x daily - 7 x weekly - 3 sets - 10 reps - Standing Hip Abduction with Counter Support  - 1 x daily - 7 x weekly - 3 sets - 10 reps  GOALS: Goals reviewed with patient? Yes   SHORT TERM GOALS: Target date: 11/08/2022   Patient will be able to ambulate 500' with RW without needing a rest  break to improve functional walking endurance. Baseline: 120' Goal status: INITIAL   2.  Pt will decrease 5xSTS to </=40 seconds w/ BUE support in order to demonstrate decreased risk for falls and improved functional bilateral LE strength and power.  Baseline: 45.25 seconds w/ BUE Goal status: INITIAL   3.  Pt will demonstrate TUG of </=50 seconds in order to decrease risk of falls and improve functional mobility using LRAD. Baseline: 61 seconds w/ RW no AFO/toe cap Goal status: INITIAL  4.  Pt will demonstrate a gait speed of >/=0.6 feet/sec in order to decrease risk for falls. Baseline: 0.51 ft/sec Goal status: INITIAL   LONG TERM GOALS: Target date: 12/06/2022   Patient will be able to ambulate >/=800' with RW without needing a rest break to improve functional walking endurance and access to community. Baseline: 120' w/ RW no AFO/toe cap Goal status: INITIAL   2.  Pt will decrease 5xSTS to </=35 seconds w/ BUE support in order to demonstrate decreased risk for falls and improved functional bilateral LE strength and power.  Baseline: 45.25 seconds w/ BUE Goal status: INITIAL   3.  Pt will demonstrate TUG of </=40 seconds in order to decrease risk of falls and improve functional mobility using LRAD. Baseline: 61 seconds w/ RW no AFO/toe cap Goal status: INITIAL  4.  Pt will demonstrate a gait speed of >/=0.7 feet/sec using LRAD in order to decrease risk for falls. Baseline: 0.51 ft/sec Goal status: INITIAL    ASSESSMENT:  CLINICAL IMPRESSION: Emphasis of skilled PT session on assessing gait with and without AFO, with addition of simulated toe cap on R shoe, and with addition of heel wedge. Pt exhibits no change in genu recurvatum with use of heel wedge this date, does have improved RLE clearance/decreased catching of foot with use of simulated toe cap. Recommended pt reach out to [name] to have a toe cap placed on his L shoe. Pt continues to benefit from skilled therapy services  due to ongoing RLE weakness leading to decreased safety and independence with functional mobility and increased fall risk. Continue POC.   OBJECTIVE IMPAIRMENTS: Abnormal gait, decreased activity tolerance, decreased balance, decreased endurance, decreased mobility, difficulty walking, decreased ROM, decreased strength, improper body mechanics, and postural dysfunction.   ACTIVITY LIMITATIONS: carrying, lifting, bending, squatting, stairs, transfers, and locomotion level  PARTICIPATION LIMITATIONS: meal prep,  cleaning, laundry, driving, community activity, and occupation  PERSONAL FACTORS: Age, Fitness, Past/current experiences, Time since onset of injury/illness/exacerbation, Transportation, and 1 comorbidity: ongoing question of transverse myelitis vs other spinal cord pathology  are also affecting patient's functional outcome.   REHAB POTENTIAL: Good  CLINICAL DECISION MAKING: Evolving/moderate complexity  EVALUATION COMPLEXITY: Moderate  PLAN:  PT FREQUENCY: 1x/week  PT DURATION: 8 weeks  PLANNED INTERVENTIONS: Therapeutic exercises, Therapeutic activity, Neuromuscular re-education, Balance training, Gait training, Patient/Family education, Self Care, Stair training, Vestibular training, Orthotic/Fit training, DME instructions, Manual therapy, and Re-evaluation  PLAN FOR NEXT SESSION: assess STG, did pt reach out to [name] about a toe cap?, add to HEP as needed, R strengthening/NMR, static unsupported balance, , endurance-SciFit   Excell Seltzer, PT, DPT, CSRS 10/31/2022, 2:00 PM

## 2022-11-04 ENCOUNTER — Telehealth: Payer: Self-pay | Admitting: Physical Therapy

## 2022-11-04 ENCOUNTER — Ambulatory Visit: Payer: Medicare Other | Admitting: Physical Therapy

## 2022-11-04 DIAGNOSIS — R2689 Other abnormalities of gait and mobility: Secondary | ICD-10-CM

## 2022-11-04 DIAGNOSIS — R293 Abnormal posture: Secondary | ICD-10-CM

## 2022-11-04 DIAGNOSIS — M6281 Muscle weakness (generalized): Secondary | ICD-10-CM

## 2022-11-04 DIAGNOSIS — R2681 Unsteadiness on feet: Secondary | ICD-10-CM

## 2022-11-04 DIAGNOSIS — R262 Difficulty in walking, not elsewhere classified: Secondary | ICD-10-CM

## 2022-11-04 DIAGNOSIS — G373 Acute transverse myelitis in demyelinating disease of central nervous system: Secondary | ICD-10-CM

## 2022-11-04 NOTE — Telephone Encounter (Signed)
Dr. Dagoberto Ligas, Mr. Maurice Cruz is being treated by physical therapy for his transverse myelitis with resulting RLE weakness.  Maurice Cruz will benefit from use of a R toe cap on his shoe in order to improve safety with functional mobility.    If you agree, please submit request in EPIC under MD Order, Other Orders (list R toe cap in comments) or fax to [facility] at [phone].   Thank you, Excell Seltzer, PT, DPT, [facility] [address] [place], [place]  [address] Phone:  [phone] Fax:  [phone]

## 2022-11-04 NOTE — Therapy (Signed)
OUTPATIENT PHYSICAL THERAPY NEURO TREATMENT   Patient Name: Maurice Cruz MRN: [id number] DOB:May 28, 1966, 57 y.o., male Today's Date: 11/04/2022   PCP: [facility]) REFERRING PROVIDER: Barbie Banner, PA-C   END OF SESSION:  PT End of Session - 11/04/22 1317     Visit Number 4    Number of Visits 9    Date for PT Re-Evaluation 12/13/22   pushed out due to scheduling delays   Authorization Type MEDICARE PART A AND B    Progress Note Due on Visit 10    PT Start Time 1315    PT Stop Time 1358    PT Time Calculation (min) 43 min    Equipment Utilized During Treatment Gait belt    Activity Tolerance Patient tolerated treatment well    Behavior During Therapy WFL for tasks assessed/performed               Past Medical History:  Diagnosis Date   Anxiety    Asthma    as a child   Depression    ED (erectile dysfunction)    GERD (gastroesophageal reflux disease)    Hx of spinal cord injury 03/2020   Hypertension    Neurogenic bladder    self caths   Neurogenic bowel    has to be digitially stimulated - 04/24/21   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[place])    Past Surgical History:  Procedure Laterality Date   Anorectal biospy  02/09/2021   Colon Biospy  02/09/2021   IR ANGIO INTRA EXTRACRAN SEL COM CAROTID INNOMINATE BILAT MOD SED  04/25/2021   IR ANGIO VERTEBRAL SEL SUBCLAVIAN INNOMINATE UNI L MOD SED  04/25/2021   IR ANGIO VERTEBRAL SEL VERTEBRAL UNI R MOD SED  04/25/2021   IR ANGIO/SPINAL LEFT  04/25/2021   IR ANGIO/SPINAL LEFT  04/25/2021   IR ANGIO/SPINAL LEFT  04/25/2021   IR ANGIO/SPINAL LEFT  04/25/2021   IR ANGIO/SPINAL LEFT  04/25/2021   IR ANGIO/SPINAL LEFT  04/25/2021   IR ANGIO/SPINAL LEFT  04/25/2021   IR ANGIO/SPINAL LEFT  04/25/2021   IR ANGIO/SPINAL LEFT  04/25/2021   IR ANGIO/SPINAL LEFT  04/25/2021   IR ANGIO/SPINAL LEFT  04/25/2021   IR ANGIO/SPINAL LEFT  04/25/2021   IR ANGIO/SPINAL LEFT  04/25/2021   IR  ANGIO/SPINAL RIGHT  04/25/2021   IR ANGIO/SPINAL RIGHT  04/25/2021   IR ANGIO/SPINAL RIGHT  04/25/2021   IR ANGIO/SPINAL RIGHT  04/25/2021   IR ANGIO/SPINAL RIGHT  04/25/2021   IR ANGIO/SPINAL RIGHT  04/25/2021   IR ANGIO/SPINAL RIGHT  04/25/2021   IR ANGIO/SPINAL RIGHT  04/25/2021   IR ANGIO/SPINAL RIGHT  04/25/2021   IR ANGIO/SPINAL RIGHT  04/25/2021   IR ANGIO/SPINAL RIGHT  04/25/2021   IR ANGIO/SPINAL RIGHT  04/25/2021   IR ANGIO/SPINAL RIGHT  04/25/2021   IR ANGIO/SPINAL RIGHT  04/25/2021   IR ANGIOGRAM EXTREMITY BILATERAL  04/25/2021   IR RADIOLOGIST EVAL & MGMT  03/21/2021   RADIOLOGY WITH ANESTHESIA N/A 04/25/2021   Procedure: IR WITH ANESTHESIA SPINAL ANGIOGRAM;  Surgeon: Luanne Bras, MD;  Location: [facility];  Service: Radiology;  Laterality: N/A;   Patient Active Problem List   Diagnosis Date Noted   Acute gout of right ankle 09/10/2022   Anemia of chronic disease 09/09/2022   Adjustment disorder with depressed mood 09/04/2022   Debility 08/30/2022   Spinal cord disorder ([place]) 08/29/2022   AKI (acute kidney injury) ([place]) 08/26/2022  Normocytic anemia 08/26/2022   Left leg pain 08/26/2022   Pleural effusion 08/26/2022   Anxiety 08/26/2022   GERD (gastroesophageal reflux disease) 08/26/2022   Essential hypertension 08/26/2022   Hyperlipidemia 08/26/2022   Leg weakness 08/25/2022   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[place]) 05/21/2021   Anti-RNP antibodies present 05/21/2021   Weakness of both lower extremities 02/05/2021   Carcinoma in situ 01/03/2021   Neurogenic bladder 12/08/2020   Neurogenic bowel 12/08/2020   Paraplegia following spinal cord injury ([place]) 12/08/2020   Wheelchair dependence 12/08/2020   Spasticity 12/08/2020   Chronic bilateral thoracic back pain 12/08/2020    ONSET DATE: 09/09/2022 (referral date)  REFERRING DIAG: R53.81 (ICD-10-CM) - Other malaise  THERAPY DIAG:  Muscle weakness (generalized)  Other abnormalities of gait and mobility  Abnormal  posture  Difficulty in walking, not elsewhere classified  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[facility])  Unsteadiness on feet  Rationale for Evaluation and Treatment: Rehabilitation  SUBJECTIVE:                                                                                                                                                                                             SUBJECTIVE STATEMENT: Pt reached out to [name] after last PT appointment, states that he does need a new referral to get his toe cap in outpatient. Pt also reports no changes since last session, no pain other than chronic back pain.  Pt accompanied by: self, dad  PERTINENT HISTORY: Transverse myelitis, neurogenic bowel and bladder  From hospital notes:  presented to [facility] ED on 08/25/2022 complaining of several history of BLE weakness, left greater than right. NS and neurology consulted. MRI with chronic changes. Neurology confirmed no TM diagnosis and MRI of complete spine negative for cord compression and stable cord hemorrhage as compared to July 2022. Complained of right ankle pain on 11/27 and started on colchicine for acute gout flare. Also complained of lingering cough and had mild fever to 100.6. Chest x-ray and blood cultures performed. Possible LLL pneumonia.   Admitted to inpatient rehab from 08/29/2022 - 09/10/2022.  Pt was ambulating at CGA level w/ toe cap.  Transferring supervision level. PAIN:  *Pain is same as prior episode of care. Are you having pain? Yes: NPRS scale: 2/10 Pain location: low back Pain description: achy Aggravating factors: activity Relieving factors: resting  PRECAUTIONS: Fall  WEIGHT BEARING RESTRICTIONS: No  FALLS: Has patient fallen in last 6 months? No  LIVING ENVIRONMENT: Lives with:  mother and father Lives in: House/apartment Stairs: Yes: Internal: 12 steps; on right going up and on left going up and External: 1 steps; none, pt has chair lift  at home for stairs. Has following  equipment at home: Gilford Rile - 2 wheeled, Wheelchair (manual), and bed side commode  PLOF: Independent with dressing, self care, transfer, parents help with cooking, cleaning.   PATIENT GOALS: "To get back to where we were when I discharged."  OBJECTIVE:  Measures assessed at initial evaluation unless otherwise noted:  LOWER EXTREMITY MMT:    MMT Right Eval Left Eval  Hip flexion 1/5 3/5  Hip extension    Hip abduction 1+/5 3+/5  Hip adduction 2+/5 3+/5  Hip internal rotation    Hip external rotation    Knee flexion    Knee extension    Ankle dorsiflexion    Ankle plantarflexion    Ankle inversion    Ankle eversion    (Blank rows = not tested)   TODAY'S TREATMENT:                                                                                                                              GAIT: Gait pattern: decreased hip/knee flexion- Right, decreased ankle dorsiflexion- Right, and circumduction- Right Distance walked: 345 ft Assistive device utilized: Walker - 2 wheeled Level of assistance: Modified independence Comments: decreased RLE control with onset of fatigue, increased UE reliance on RW with onset of fatigue, 6/10 RPE; pt does fatigue after 345 ft and unable to ambulate x 500 ft in order to meet STG  THER ACT: For reassessment of STG:  [facility] PT Assessment - 11/04/22 1319       Ambulation/Gait   Gait velocity 32.8 ft over 26 sec = 1.26 ft/sec      Standardized Balance Assessment   Standardized Balance Assessment Five Times Sit to Stand;Timed Up and Go Test    Five times sit to stand comments  35.97 sec   BUE pushing from mat table     Timed Up and Go Test   TUG Normal TUG    Normal TUG (seconds) 36.5   with RW at mod I level            In // bars for RLE strengthening: 2" step taps x 10 reps Progression to resisted 2" step-taps with RTB 2 x 10 reps Transition back to 2" step-taps, unresisted with improvement in performance  4" eccentric step-downs  with LLE 2 x 10 reps 4" step-ups with RLE 2 x 10 reps  Gait along length of // bars 5 x 10 ft with one UE support with progression to no UE support, decreased RLE hip and knee flexion noted, circumduction of RLE, L lateral lean when advancing RLE   PATIENT EDUCATION: Education details: continue HEP, process for toe cap with PT to reach out to MD for order for toe cap Person educated: Patient Education method: Explanation Education comprehension: verbalized understanding  HOME EXERCISE PROGRAM: Access Code: [email or web address] URL: [email or web address] Date: 10/21/2022 Prepared by: Excell Seltzer  Exercises - Supine Heel Slide with Strap  - 1 x daily -  7 x weekly - 3 sets - 10 reps - Clamshell  - 1 x daily - 7 x weekly - 3 sets - 10 reps - Sit to Stand Without Arm Support  - 1 x daily - 7 x weekly - 3 sets - 10 reps - Side Stepping with Counter Support  - 1 x daily - 7 x weekly - 3 sets - 10 reps - Standing March with Counter Support  - 1 x daily - 7 x weekly - 3 sets - 10 reps - Standing Hip Abduction with Counter Support  - 1 x daily - 7 x weekly - 3 sets - 10 reps  GOALS: Goals reviewed with patient? Yes   SHORT TERM GOALS: Target date: 11/08/2022   Patient will be able to ambulate 500' with RW without needing a rest break to improve functional walking endurance. Baseline: 120', 345 ft with RW mod I (2/5) Goal status: IN PROGRESS   2.  Pt will decrease 5xSTS to </=40 seconds w/ BUE support in order to demonstrate decreased risk for falls and improved functional bilateral LE strength and power.  Baseline: 45.25 seconds w/ BUE, 35.97 sec with BUE (2/5) Goal status: MET   3.  Pt will demonstrate TUG of </=50 seconds in order to decrease risk of falls and improve functional mobility using LRAD. Baseline: 61 seconds w/ RW no AFO/toe cap, 36.5 sec with RW with no AFO/toe cap (2/5) Goal status: MET  4.  Pt will demonstrate a gait speed of >/=0.6 feet/sec in order to decrease  risk for falls. Baseline: 0.51 ft/sec, 1.26 ft/sec with RW mod I (2/5) Goal status: MET   LONG TERM GOALS: Target date: 12/06/2022   Patient will be able to ambulate >/=800' with RW without needing a rest break to improve functional walking endurance and access to community. Baseline: 120' w/ RW no AFO/toe cap Goal status: INITIAL   2.  Pt will decrease 5xSTS to </=35 seconds w/ BUE support in order to demonstrate decreased risk for falls and improved functional bilateral LE strength and power.  Baseline: 45.25 seconds w/ BUE Goal status: INITIAL   3.  Pt will demonstrate TUG of </=40 seconds in order to decrease risk of falls and improve functional mobility using LRAD. Baseline: 61 seconds w/ RW no AFO/toe cap Goal status: INITIAL  4.  Pt will demonstrate a gait speed of >/=0.7 feet/sec using LRAD in order to decrease risk for falls. Baseline: 0.51 ft/sec Goal status: INITIAL    ASSESSMENT:  CLINICAL IMPRESSION: Emphasis of skilled PT session on reassessing STG and continuing to work on RLE strengthening. Pt has met 3/4 STG due to  improving his 5xSTS score from 45.25 sec initially to 35.97 sec this date, improving his TUG score from 61 sec initially to 36.5 sec this date, and improving his gait speed from 0.51 ft/sec to 1.26 ft/sec this date, overall demonstrating decreased fall risk. Pt has improved his endurance and is now able to ambulate x 345 ft before onset of fatigue and needing a seated rest break, and improvement from 120 ft initially however he did not meet STG of 500 ft. Pt continues to benefit from skilled therapy services due to ongoing RLE weakness and decreased endurance leading to impaired balance and increased fall risk. Continue POC.   OBJECTIVE IMPAIRMENTS: Abnormal gait, decreased activity tolerance, decreased balance, decreased endurance, decreased mobility, difficulty walking, decreased ROM, decreased strength, improper body mechanics, and postural dysfunction.    ACTIVITY LIMITATIONS: carrying, lifting, bending,  squatting, stairs, transfers, and locomotion level  PARTICIPATION LIMITATIONS: meal prep, cleaning, laundry, driving, community activity, and occupation  PERSONAL FACTORS: Age, Fitness, Past/current experiences, Time since onset of injury/illness/exacerbation, Transportation, and 1 comorbidity: ongoing question of transverse myelitis vs other spinal cord pathology  are also affecting patient's functional outcome.   REHAB POTENTIAL: Good  CLINICAL DECISION MAKING: Evolving/moderate complexity  EVALUATION COMPLEXITY: Moderate  PLAN:  PT FREQUENCY: 1x/week  PT DURATION: 8 weeks  PLANNED INTERVENTIONS: Therapeutic exercises, Therapeutic activity, Neuromuscular re-education, Balance training, Gait training, Patient/Family education, Self Care, Stair training, Vestibular training, Orthotic/Fit training, DME instructions, Manual therapy, and Re-evaluation  PLAN FOR NEXT SESSION: did we get order for toe cap? add to HEP as needed, R strengthening/NMR, static unsupported balance, , endurance-SciFit   Excell Seltzer, PT, DPT, CSRS 11/04/2022, 1:58 PM

## 2022-11-06 NOTE — Telephone Encounter (Signed)
Since I cnanot get it to go through [facility], I wrote out Rx and having office fax it to you- thanks- ML

## 2022-11-11 ENCOUNTER — Ambulatory Visit: Payer: Medicare Other | Admitting: Physical Therapy

## 2022-11-11 ENCOUNTER — Encounter: Payer: Self-pay | Admitting: Physical Therapy

## 2022-11-11 DIAGNOSIS — R2689 Other abnormalities of gait and mobility: Secondary | ICD-10-CM

## 2022-11-11 DIAGNOSIS — M6281 Muscle weakness (generalized): Secondary | ICD-10-CM | POA: Diagnosis not present

## 2022-11-11 DIAGNOSIS — R262 Difficulty in walking, not elsewhere classified: Secondary | ICD-10-CM

## 2022-11-11 DIAGNOSIS — R2681 Unsteadiness on feet: Secondary | ICD-10-CM

## 2022-11-11 DIAGNOSIS — R293 Abnormal posture: Secondary | ICD-10-CM

## 2022-11-11 NOTE — Therapy (Unsigned)
OUTPATIENT PHYSICAL THERAPY NEURO TREATMENT   Patient Name: Maurice Cruz MRN: [id number] DOB:1966/06/05, 57 y.o., male Today's Date: 11/11/2022   PCP: [facility]) REFERRING PROVIDER: Barbie Banner, PA-C   END OF SESSION:  PT End of Session - 11/11/22 1409     Visit Number 5    Number of Visits 9    Date for PT Re-Evaluation 12/13/22   pushed out due to scheduling delays   Authorization Type MEDICARE PART A AND B    Progress Note Due on Visit 10    PT Start Time 1405   PT running behind, providing handoff to OT for pt prior.   PT Stop Time 1444    PT Time Calculation (min) 39 min    Equipment Utilized During Treatment Gait belt    Activity Tolerance Patient tolerated treatment well    Behavior During Therapy WFL for tasks assessed/performed               Past Medical History:  Diagnosis Date   Anxiety    Asthma    as a child   Depression    ED (erectile dysfunction)    GERD (gastroesophageal reflux disease)    Hx of spinal cord injury 03/2020   Hypertension    Neurogenic bladder    self caths   Neurogenic bowel    has to be digitially stimulated - 04/24/21   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[place])    bilateral legs   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[place])    Past Surgical History:  Procedure Laterality Date   Anorectal biospy  02/09/2021   Colon Biospy  02/09/2021   IR ANGIO INTRA EXTRACRAN SEL COM CAROTID INNOMINATE BILAT MOD SED  04/25/2021   IR ANGIO VERTEBRAL SEL SUBCLAVIAN INNOMINATE UNI L MOD SED  04/25/2021   IR ANGIO VERTEBRAL SEL VERTEBRAL UNI R MOD SED  04/25/2021   IR ANGIO/SPINAL LEFT  04/25/2021   IR ANGIO/SPINAL LEFT  04/25/2021   IR ANGIO/SPINAL LEFT  04/25/2021   IR ANGIO/SPINAL LEFT  04/25/2021   IR ANGIO/SPINAL LEFT  04/25/2021   IR ANGIO/SPINAL LEFT  04/25/2021   IR ANGIO/SPINAL LEFT  04/25/2021   IR ANGIO/SPINAL LEFT  04/25/2021   IR ANGIO/SPINAL LEFT  04/25/2021   IR ANGIO/SPINAL LEFT  04/25/2021   IR ANGIO/SPINAL LEFT  04/25/2021   IR  ANGIO/SPINAL LEFT  04/25/2021   IR ANGIO/SPINAL LEFT  04/25/2021   IR ANGIO/SPINAL RIGHT  04/25/2021   IR ANGIO/SPINAL RIGHT  04/25/2021   IR ANGIO/SPINAL RIGHT  04/25/2021   IR ANGIO/SPINAL RIGHT  04/25/2021   IR ANGIO/SPINAL RIGHT  04/25/2021   IR ANGIO/SPINAL RIGHT  04/25/2021   IR ANGIO/SPINAL RIGHT  04/25/2021   IR ANGIO/SPINAL RIGHT  04/25/2021   IR ANGIO/SPINAL RIGHT  04/25/2021   IR ANGIO/SPINAL RIGHT  04/25/2021   IR ANGIO/SPINAL RIGHT  04/25/2021   IR ANGIO/SPINAL RIGHT  04/25/2021   IR ANGIO/SPINAL RIGHT  04/25/2021   IR ANGIO/SPINAL RIGHT  04/25/2021   IR ANGIOGRAM EXTREMITY BILATERAL  04/25/2021   IR RADIOLOGIST EVAL & MGMT  03/21/2021   RADIOLOGY WITH ANESTHESIA N/A 04/25/2021   Procedure: IR WITH ANESTHESIA SPINAL ANGIOGRAM;  Surgeon: Luanne Bras, MD;  Location: [facility] OR;  Service: Radiology;  Laterality: N/A;   Patient Active Problem List   Diagnosis Date Noted   Acute gout of right ankle 09/10/2022   Anemia of chronic disease 09/09/2022   Adjustment disorder with depressed mood 09/04/2022   Debility 08/30/2022   Spinal cord  disorder ([place]) 08/29/2022   AKI (acute kidney injury) ([place]) 08/26/2022   Normocytic anemia 08/26/2022   Left leg pain 08/26/2022   Pleural effusion 08/26/2022   Anxiety 08/26/2022   GERD (gastroesophageal reflux disease) 08/26/2022   Essential hypertension 08/26/2022   Hyperlipidemia 08/26/2022   Leg weakness 08/25/2022   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[place]) 05/21/2021   Anti-RNP antibodies present 05/21/2021   Weakness of both lower extremities 02/05/2021   Carcinoma in situ 01/03/2021   Neurogenic bladder 12/08/2020   Neurogenic bowel 12/08/2020   Paraplegia following spinal cord injury ([place]) 12/08/2020   Wheelchair dependence 12/08/2020   Spasticity 12/08/2020   Chronic bilateral thoracic back pain 12/08/2020    ONSET DATE: 09/09/2022 (referral date)  REFERRING DIAG: R53.81 (ICD-10-CM) - Other malaise  THERAPY DIAG:  Muscle weakness  (generalized)  Other abnormalities of gait and mobility  Abnormal posture  Difficulty in walking, not elsewhere classified  Unsteadiness on feet  Rationale for Evaluation and Treatment: Rehabilitation  SUBJECTIVE:                                                                                                                                                                                             SUBJECTIVE STATEMENT: Pt returns with dad present for session.  Pt asks for futher details regarding [name] referral.  Pt also reports no changes or falls since last session, no pain other than chronic back pain.  He states his endurance seems to be very slowly returning.  Pt accompanied by: self, dad  PERTINENT HISTORY: Transverse myelitis, neurogenic bowel and bladder  From hospital notes:  presented to [facility] ED on 08/25/2022 complaining of several history of BLE weakness, left greater than right. NS and neurology consulted. MRI with chronic changes. Neurology confirmed no TM diagnosis and MRI of complete spine negative for cord compression and stable cord hemorrhage as compared to July 2022. Complained of right ankle pain on 11/27 and started on colchicine for acute gout flare. Also complained of lingering cough and had mild fever to 100.6. Chest x-ray and blood cultures performed. Possible LLL pneumonia.   Admitted to inpatient rehab from 08/29/2022 - 09/10/2022.  Pt was ambulating at CGA level w/ toe cap.  Transferring supervision level. PAIN:  *Pain is same as prior episode of care. Are you having pain? Yes: NPRS scale: 2/10 Pain location: low back Pain description: achy Aggravating factors: activity Relieving factors: resting  PRECAUTIONS: Fall  WEIGHT BEARING RESTRICTIONS: No  FALLS: Has patient fallen in last 6 months? No  LIVING ENVIRONMENT: Lives with:  mother and father Lives in: House/apartment Stairs: Yes: Internal: 12 steps; on right going  up and on left going up and  External: 1 steps; none, pt has chair lift at home for stairs. Has following equipment at home: Gilford Rile - 2 wheeled, Wheelchair (manual), and bed side commode  PLOF: Independent with dressing, self care, transfer, parents help with cooking, cleaning.   PATIENT GOALS: "To get back to where we were when I discharged."  OBJECTIVE:  Measures assessed at initial evaluation unless otherwise noted:  LOWER EXTREMITY MMT:    MMT Right Eval Left Eval  Hip flexion 1/5 3/5  Hip extension    Hip abduction 1+/5 3+/5  Hip adduction 2+/5 3+/5  Hip internal rotation    Hip external rotation    Knee flexion    Knee extension    Ankle dorsiflexion    Ankle plantarflexion    Ankle inversion    Ankle eversion    (Blank rows = not tested)   TODAY'S TREATMENT:                                                                                                                              GAIT: Gait pattern: decreased hip/knee flexion- Right, decreased ankle dorsiflexion- Right, and circumduction- Right Distance walked: 345 ft Assistive device utilized: Walker - 2 wheeled Level of assistance: Modified independence Comments: decreased RLE control with onset of fatigue, increased UE reliance on RW with onset of fatigue, 6/10 RPE; pt does fatigue after 345 ft and unable to ambulate x 500 ft in order to meet STG  THER ACT: PT provides updates on referral to [name] for toe cap and process. -PT changes out pt tennis balls to improve safety and quality of mobility w/ AD. -Dynamic warmup on SciFit using BLE only progressing from level 3.0 to 5.0 over 8 minutes.  Pt rates RPE   NMR: Standing w/ back to countertop performing forward reach to chair outside BOS x10 > 2.2lb ball taps to chair back x5 > 3.3lb ball taps x6 > lateral reach unsupported x10 each side outside BOS > standing trunk rotation reaches x10 each side; crossbody reaching w/ RUE and reaching laterally to right were most difficult for patient LLE  in SLS on volleyball progressing to unsupported hold over 2 minutes cued to unlock right knee maintaining balance throughout   PATIENT EDUCATION: Education details: continue HEP, process for toe cap with PT to reach out to MD for order for toe cap Person educated: Patient Education method: Explanation Education comprehension: verbalized understanding  HOME EXERCISE PROGRAM: Access Code: [email or web address] URL: [email or web address] Date: 10/21/2022 Prepared by: Excell Seltzer  Exercises - Supine Heel Slide with Strap  - 1 x daily - 7 x weekly - 3 sets - 10 reps - Clamshell  - 1 x daily - 7 x weekly - 3 sets - 10 reps - Sit to Stand Without Arm Support  - 1 x daily - 7 x weekly - 3 sets - 10 reps - Side Stepping with Counter Support  - 1  x daily - 7 x weekly - 3 sets - 10 reps - Standing March with Counter Support  - 1 x daily - 7 x weekly - 3 sets - 10 reps - Standing Hip Abduction with Counter Support  - 1 x daily - 7 x weekly - 3 sets - 10 reps  GOALS: Goals reviewed with patient? Yes   SHORT TERM GOALS: Target date: 11/08/2022   Patient will be able to ambulate 500' with RW without needing a rest break to improve functional walking endurance. Baseline: 120', 345 ft with RW mod I (2/5) Goal status: IN PROGRESS   2.  Pt will decrease 5xSTS to </=40 seconds w/ BUE support in order to demonstrate decreased risk for falls and improved functional bilateral LE strength and power.  Baseline: 45.25 seconds w/ BUE, 35.97 sec with BUE (2/5) Goal status: MET   3.  Pt will demonstrate TUG of </=50 seconds in order to decrease risk of falls and improve functional mobility using LRAD. Baseline: 61 seconds w/ RW no AFO/toe cap, 36.5 sec with RW with no AFO/toe cap (2/5) Goal status: MET  4.  Pt will demonstrate a gait speed of >/=0.6 feet/sec in order to decrease risk for falls. Baseline: 0.51 ft/sec, 1.26 ft/sec with RW mod I (2/5) Goal status: MET   LONG TERM GOALS: Target date:  12/06/2022   Patient will be able to ambulate >/=800' with RW without needing a rest break to improve functional walking endurance and access to community. Baseline: 120' w/ RW no AFO/toe cap Goal status: INITIAL   2.  Pt will decrease 5xSTS to </=35 seconds w/ BUE support in order to demonstrate decreased risk for falls and improved functional bilateral LE strength and power.  Baseline: 45.25 seconds w/ BUE Goal status: INITIAL   3.  Pt will demonstrate TUG of </=40 seconds in order to decrease risk of falls and improve functional mobility using LRAD. Baseline: 61 seconds w/ RW no AFO/toe cap Goal status: INITIAL  4.  Pt will demonstrate a gait speed of >/=0.7 feet/sec using LRAD in order to decrease risk for falls. Baseline: 0.51 ft/sec Goal status: INITIAL    ASSESSMENT:  CLINICAL IMPRESSION: Emphasis of skilled PT session on reassessing STG and continuing to work on RLE strengthening. Pt has met 3/4 STG due to  improving his 5xSTS score from 45.25 sec initially to 35.97 sec this date, improving his TUG score from 61 sec initially to 36.5 sec this date, and improving his gait speed from 0.51 ft/sec to 1.26 ft/sec this date, overall demonstrating decreased fall risk. Pt has improved his endurance and is now able to ambulate x 345 ft before onset of fatigue and needing a seated rest break, and improvement from 120 ft initially however he did not meet STG of 500 ft. Pt continues to benefit from skilled therapy services due to ongoing RLE weakness and decreased endurance leading to impaired balance and increased fall risk. Continue POC.   OBJECTIVE IMPAIRMENTS: Abnormal gait, decreased activity tolerance, decreased balance, decreased endurance, decreased mobility, difficulty walking, decreased ROM, decreased strength, improper body mechanics, and postural dysfunction.   ACTIVITY LIMITATIONS: carrying, lifting, bending, squatting, stairs, transfers, and locomotion level  PARTICIPATION  LIMITATIONS: meal prep, cleaning, laundry, driving, community activity, and occupation  PERSONAL FACTORS: Age, Fitness, Past/current experiences, Time since onset of injury/illness/exacerbation, Transportation, and 1 comorbidity: ongoing question of transverse myelitis vs other spinal cord pathology  are also affecting patient's functional outcome.   REHAB POTENTIAL: Good  CLINICAL DECISION MAKING: Evolving/moderate complexity  EVALUATION COMPLEXITY: Moderate  PLAN:  PT FREQUENCY: 1x/week  PT DURATION: 8 weeks  PLANNED INTERVENTIONS: Therapeutic exercises, Therapeutic activity, Neuromuscular re-education, Balance training, Gait training, Patient/Family education, Self Care, Stair training, Vestibular training, Orthotic/Fit training, DME instructions, Manual therapy, and Re-evaluation  PLAN FOR NEXT SESSION: did we get order for toe cap? add to HEP as needed, R strengthening/NMR, static unsupported balance, , endurance-SciFit   Bary Richard, PT, DPT 11/11/2022, 2:45 PM

## 2022-11-18 ENCOUNTER — Encounter: Payer: No Typology Code available for payment source | Admitting: Physical Medicine and Rehabilitation

## 2022-11-19 ENCOUNTER — Ambulatory Visit: Payer: Medicare Other | Admitting: Physical Therapy

## 2022-11-19 ENCOUNTER — Encounter: Payer: Self-pay | Admitting: Physical Therapy

## 2022-11-19 VITALS — BP 125/75 | HR 47

## 2022-11-19 DIAGNOSIS — M6281 Muscle weakness (generalized): Secondary | ICD-10-CM

## 2022-11-19 DIAGNOSIS — R262 Difficulty in walking, not elsewhere classified: Secondary | ICD-10-CM

## 2022-11-19 DIAGNOSIS — R293 Abnormal posture: Secondary | ICD-10-CM

## 2022-11-19 DIAGNOSIS — R2689 Other abnormalities of gait and mobility: Secondary | ICD-10-CM

## 2022-11-19 DIAGNOSIS — R2681 Unsteadiness on feet: Secondary | ICD-10-CM

## 2022-11-19 NOTE — Therapy (Unsigned)
OUTPATIENT PHYSICAL THERAPY NEURO TREATMENT   Patient Name: Maurice Cruz MRN: [id number] DOB:31-Aug-1966, 57 y.o., male Today's Date: 11/20/2022   PCP: [facility]) REFERRING PROVIDER: Barbie Banner, PA-C   END OF SESSION:  PT End of Session - 11/19/22 1322     Visit Number 6    Number of Visits 9    Date for PT Re-Evaluation 12/13/22   pushed out due to scheduling delays   Authorization Type MEDICARE PART A AND B    Progress Note Due on Visit 10    PT Start Time 1319    PT Stop Time 1402    PT Time Calculation (min) 43 min    Equipment Utilized During Treatment Gait belt    Activity Tolerance Patient tolerated treatment well    Behavior During Therapy WFL for tasks assessed/performed               Past Medical History:  Diagnosis Date   Anxiety    Asthma    as a child   Depression    ED (erectile dysfunction)    GERD (gastroesophageal reflux disease)    Hx of spinal cord injury 03/2020   Hypertension    Neurogenic bladder    self caths   Neurogenic bowel    has to be digitially stimulated - 04/24/21   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[place])    Past Surgical History:  Procedure Laterality Date   Anorectal biospy  02/09/2021   Colon Biospy  02/09/2021   IR ANGIO INTRA EXTRACRAN SEL COM CAROTID INNOMINATE BILAT MOD SED  04/25/2021   IR ANGIO VERTEBRAL SEL SUBCLAVIAN INNOMINATE UNI L MOD SED  04/25/2021   IR ANGIO VERTEBRAL SEL VERTEBRAL UNI R MOD SED  04/25/2021   IR ANGIO/SPINAL LEFT  04/25/2021   IR ANGIO/SPINAL LEFT  04/25/2021   IR ANGIO/SPINAL LEFT  04/25/2021   IR ANGIO/SPINAL LEFT  04/25/2021   IR ANGIO/SPINAL LEFT  04/25/2021   IR ANGIO/SPINAL LEFT  04/25/2021   IR ANGIO/SPINAL LEFT  04/25/2021   IR ANGIO/SPINAL LEFT  04/25/2021   IR ANGIO/SPINAL LEFT  04/25/2021   IR ANGIO/SPINAL LEFT  04/25/2021   IR ANGIO/SPINAL LEFT  04/25/2021   IR ANGIO/SPINAL LEFT  04/25/2021   IR ANGIO/SPINAL LEFT  04/25/2021   IR  ANGIO/SPINAL RIGHT  04/25/2021   IR ANGIO/SPINAL RIGHT  04/25/2021   IR ANGIO/SPINAL RIGHT  04/25/2021   IR ANGIO/SPINAL RIGHT  04/25/2021   IR ANGIO/SPINAL RIGHT  04/25/2021   IR ANGIO/SPINAL RIGHT  04/25/2021   IR ANGIO/SPINAL RIGHT  04/25/2021   IR ANGIO/SPINAL RIGHT  04/25/2021   IR ANGIO/SPINAL RIGHT  04/25/2021   IR ANGIO/SPINAL RIGHT  04/25/2021   IR ANGIO/SPINAL RIGHT  04/25/2021   IR ANGIO/SPINAL RIGHT  04/25/2021   IR ANGIO/SPINAL RIGHT  04/25/2021   IR ANGIO/SPINAL RIGHT  04/25/2021   IR ANGIOGRAM EXTREMITY BILATERAL  04/25/2021   IR RADIOLOGIST EVAL & MGMT  03/21/2021   RADIOLOGY WITH ANESTHESIA N/A 04/25/2021   Procedure: IR WITH ANESTHESIA SPINAL ANGIOGRAM;  Surgeon: Luanne Bras, MD;  Location: [facility];  Service: Radiology;  Laterality: N/A;   Patient Active Problem List   Diagnosis Date Noted   Acute gout of right ankle 09/10/2022   Anemia of chronic disease 09/09/2022   Adjustment disorder with depressed mood 09/04/2022   Debility 08/30/2022   Spinal cord disorder ([place]) 08/29/2022   AKI (acute kidney injury) ([place]) 08/26/2022  Normocytic anemia 08/26/2022   Left leg pain 08/26/2022   Pleural effusion 08/26/2022   Anxiety 08/26/2022   GERD (gastroesophageal reflux disease) 08/26/2022   Essential hypertension 08/26/2022   Hyperlipidemia 08/26/2022   Leg weakness 08/25/2022   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[place]) 05/21/2021   Anti-RNP antibodies present 05/21/2021   Weakness of both lower extremities 02/05/2021   Carcinoma in situ 01/03/2021   Neurogenic bladder 12/08/2020   Neurogenic bowel 12/08/2020   Paraplegia following spinal cord injury ([place]) 12/08/2020   Wheelchair dependence 12/08/2020   Spasticity 12/08/2020   Chronic bilateral thoracic back pain 12/08/2020    ONSET DATE: 09/09/2022 (referral date)  REFERRING DIAG: R53.81 (ICD-10-CM) - Other malaise  THERAPY DIAG:  Muscle weakness (generalized)  Other abnormalities of gait and mobility  Abnormal  posture  Difficulty in walking, not elsewhere classified  Unsteadiness on feet  Rationale for Evaluation and Treatment: Rehabilitation  SUBJECTIVE:                                                                                                                                                                                             SUBJECTIVE STATEMENT: Pt returns by himself.  He has not heard from [place] (PT encouraged follow-up).  His appt with Dr. Dagoberto Ligas was rescheduled to 4/26.  He denies falls or acute changes since visit prior.    Pt accompanied by: self, dad  PERTINENT HISTORY: Transverse myelitis, neurogenic bowel and bladder  From hospital notes:  presented to [facility] ED on 08/25/2022 complaining of several history of BLE weakness, left greater than right. NS and neurology consulted. MRI with chronic changes. Neurology confirmed no TM diagnosis and MRI of complete spine negative for cord compression and stable cord hemorrhage as compared to July 2022. Complained of right ankle pain on 11/27 and started on colchicine for acute gout flare. Also complained of lingering cough and had mild fever to 100.6. Chest x-ray and blood cultures performed. Possible LLL pneumonia.   Admitted to inpatient rehab from 08/29/2022 - 09/10/2022.  Pt was ambulating at CGA level w/ toe cap.  Transferring supervision level. PAIN:  Are you having pain? Yes: NPRS scale: 3/10 Pain location: low back Pain description: achy Aggravating factors: activity Relieving factors: resting  PRECAUTIONS: Fall  WEIGHT BEARING RESTRICTIONS: No  FALLS: Has patient fallen in last 6 months? No  LIVING ENVIRONMENT: Lives with:  mother and father Lives in: House/apartment Stairs: Yes: Internal: 12 steps; on right going up and on left going up and External: 1 steps; none, pt has chair lift at home for stairs. Has following equipment at home: Gilford Rile - 2 wheeled, Wheelchair (manual),  and bed side commode  PLOF:  Independent with dressing, self care, transfer, parents help with cooking, cleaning.   PATIENT GOALS: "To get back to where we were when I discharged."  OBJECTIVE:  Measures assessed at initial evaluation unless otherwise noted:  LOWER EXTREMITY MMT:    MMT Right Eval Left Eval  Hip flexion 1/5 3/5  Hip extension    Hip abduction 1+/5 3+/5  Hip adduction 2+/5 3+/5  Hip internal rotation    Hip external rotation    Knee flexion    Knee extension    Ankle dorsiflexion    Ankle plantarflexion    Ankle inversion    Ankle eversion    (Blank rows = not tested)   TODAY'S TREATMENT:                                                                                                                               TherEx: -Dynamic warmup on SciFit using BLE only regressing to BUE as well over last half of task to prevent excessive fatigue in hill mode on level 5.0 for 8 minutes.    -BP assessed following SciFit as pt reports he has felt off, not quite dizzy, but different for him in various positions at random times for the past several weeks.  L BP assessed in sitting: Today's Vitals   11/19/22 1340  BP: 125/75  Pulse: (!) 47    NMR: Semi-tandem tossing bean bag to far targets of corresponding color > alternating step forward and back w/ lateral oriented toss to corresponding color  GAIT: Gait pattern: step through pattern, decreased arm swing- Left, decreased stride length, decreased hip/knee flexion- Right, decreased ankle dorsiflexion- Right, genu recurvatum- Right, narrow BOS, and poor foot clearance- Right Distance walked: 200' Assistive device utilized: Youth worker cane large base Level of assistance: CGA Comments: Pt requesting to trial on the right this visit, would benefit from further trial on the left in future visit.  Swapped to [facility] midway through ambulation to assess improved stability w/ pt preferring large base and appearing more stable, x2 instances  of right toe catch secondary to out-toeing w/ fatigue at increased distance.  Guarded UE posture on the left.  Right knee hyperextension in stance w/ mild knee torsion noticed in push-off.  He ambulates w/ significantly decreased gait speed w/ quad cane vs RW.  PATIENT EDUCATION: Education details: Continue HEP and follow-up with [name] if still not scheduled within a week or so. Person educated: Patient Education method: Explanation Education comprehension: verbalized understanding  HOME EXERCISE PROGRAM: Access Code: [email or web address] URL: [email or web address] Date: 10/21/2022 Prepared by: Excell Seltzer  Exercises - Supine Heel Slide with Strap  - 1 x daily - 7 x weekly - 3 sets - 10 reps - Clamshell  - 1 x daily - 7 x weekly - 3 sets - 10 reps - Sit to Stand Without Arm Support  - 1 x daily -  7 x weekly - 3 sets - 10 reps - Side Stepping with Counter Support  - 1 x daily - 7 x weekly - 3 sets - 10 reps - Standing March with Counter Support  - 1 x daily - 7 x weekly - 3 sets - 10 reps - Standing Hip Abduction with Counter Support  - 1 x daily - 7 x weekly - 3 sets - 10 reps  GOALS: Goals reviewed with patient? Yes   SHORT TERM GOALS: Target date: 11/08/2022   Patient will be able to ambulate 500' with RW without needing a rest break to improve functional walking endurance. Baseline: 120', 345 ft with RW mod I (2/5) Goal status: IN PROGRESS   2.  Pt will decrease 5xSTS to </=40 seconds w/ BUE support in order to demonstrate decreased risk for falls and improved functional bilateral LE strength and power.  Baseline: 45.25 seconds w/ BUE, 35.97 sec with BUE (2/5) Goal status: MET   3.  Pt will demonstrate TUG of </=50 seconds in order to decrease risk of falls and improve functional mobility using LRAD. Baseline: 61 seconds w/ RW no AFO/toe cap, 36.5 sec with RW with no AFO/toe cap (2/5) Goal status: MET  4.  Pt will demonstrate a gait speed of >/=0.6 feet/sec in order to  decrease risk for falls. Baseline: 0.51 ft/sec, 1.26 ft/sec with RW mod I (2/5) Goal status: MET   LONG TERM GOALS: Target date: 12/06/2022   Patient will be able to ambulate >/=800' with RW without needing a rest break to improve functional walking endurance and access to community. Baseline: 120' w/ RW no AFO/toe cap Goal status: INITIAL   2.  Pt will decrease 5xSTS to </=35 seconds w/ BUE support in order to demonstrate decreased risk for falls and improved functional bilateral LE strength and power.  Baseline: 45.25 seconds w/ BUE Goal status: INITIAL   3.  Pt will demonstrate TUG of </=40 seconds in order to decrease risk of falls and improve functional mobility using LRAD. Baseline: 61 seconds w/ RW no AFO/toe cap Goal status: INITIAL  4.  Pt will demonstrate a gait speed of >/=0.7 feet/sec using LRAD in order to decrease risk for falls. Baseline: 0.51 ft/sec Goal status: INITIAL    ASSESSMENT:  CLINICAL IMPRESSION: Pt tolerates all challenges presented this session well with notable improvement in unsupported standing balance and trunk control during NMR tasks this session.  He requests to trial the quad cane again this session and upon doing so performs best w/ large base quad cane vs narrow base quad cane when used on his right per initial preference.  PT will evaluate with cane on the left next session as it is anticipated that he may perform better with this.  He does continue to have some RLE deficits that would be better managed with a toe cap at present, but he is awaiting follo w-up with [facility].  PT will continue to address safety and functionality of progression to LRAD as well as upright balance for improved independence.    OBJECTIVE IMPAIRMENTS: Abnormal gait, decreased activity tolerance, decreased balance, decreased endurance, decreased mobility, difficulty walking, decreased ROM, decreased strength, improper body mechanics, and postural dysfunction.    ACTIVITY LIMITATIONS: carrying, lifting, bending, squatting, stairs, transfers, and locomotion level  PARTICIPATION LIMITATIONS: meal prep, cleaning, laundry, driving, community activity, and occupation  PERSONAL FACTORS: Age, Fitness, Past/current experiences, Time since onset of injury/illness/exacerbation, Transportation, and 1 comorbidity: ongoing question of transverse myelitis vs  other spinal cord pathology  are also affecting patient's functional outcome.   REHAB POTENTIAL: Good  CLINICAL DECISION MAKING: Evolving/moderate complexity  EVALUATION COMPLEXITY: Moderate  PLAN:  PT FREQUENCY: 1x/week  PT DURATION: 8 weeks  PLANNED INTERVENTIONS: Therapeutic exercises, Therapeutic activity, Neuromuscular re-education, Balance training, Gait training, Patient/Family education, Self Care, Stair training, Vestibular training, Orthotic/Fit training, DME instructions, Manual therapy, and Re-evaluation  PLAN FOR NEXT SESSION: did we get order for toe cap? add to HEP as needed, R strengthening/NMR, static unsupported balance, endurance-SciFit, LBQC on left   Bary Richard, PT, DPT 11/20/2022, 11:30 AM

## 2022-11-25 ENCOUNTER — Ambulatory Visit: Payer: Medicare Other | Admitting: Physical Therapy

## 2022-11-25 DIAGNOSIS — R2681 Unsteadiness on feet: Secondary | ICD-10-CM

## 2022-11-25 DIAGNOSIS — M6281 Muscle weakness (generalized): Secondary | ICD-10-CM | POA: Diagnosis not present

## 2022-11-25 DIAGNOSIS — R262 Difficulty in walking, not elsewhere classified: Secondary | ICD-10-CM

## 2022-11-25 DIAGNOSIS — R293 Abnormal posture: Secondary | ICD-10-CM

## 2022-11-25 DIAGNOSIS — G373 Acute transverse myelitis in demyelinating disease of central nervous system: Secondary | ICD-10-CM

## 2022-11-25 DIAGNOSIS — R2689 Other abnormalities of gait and mobility: Secondary | ICD-10-CM

## 2022-11-25 NOTE — Therapy (Signed)
OUTPATIENT PHYSICAL THERAPY NEURO TREATMENT   Patient Name: Maurice Cruz MRN: [id number] DOB:07-14-1966, 57 y.o., male Today's Date: 11/25/2022   PCP: [facility]) REFERRING PROVIDER: Barbie Banner, PA-C   END OF SESSION:  PT End of Session - 11/25/22 1316     Visit Number 7    Number of Visits 9    Date for PT Re-Evaluation 12/13/22   pushed out due to scheduling delays   Authorization Type MEDICARE PART A AND B    Progress Note Due on Visit 10    PT Start Time 1315    PT Stop Time 1400    PT Time Calculation (min) 45 min    Equipment Utilized During Treatment Gait belt    Activity Tolerance Patient tolerated treatment well    Behavior During Therapy WFL for tasks assessed/performed                Past Medical History:  Diagnosis Date   Anxiety    Asthma    as a child   Depression    ED (erectile dysfunction)    GERD (gastroesophageal reflux disease)    Hx of spinal cord injury 03/2020   Hypertension    Neurogenic bladder    self caths   Neurogenic bowel    has to be digitially stimulated - 04/24/21   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[place])    Past Surgical History:  Procedure Laterality Date   Anorectal biospy  02/09/2021   Colon Biospy  02/09/2021   IR ANGIO INTRA EXTRACRAN SEL COM CAROTID INNOMINATE BILAT MOD SED  04/25/2021   IR ANGIO VERTEBRAL SEL SUBCLAVIAN INNOMINATE UNI L MOD SED  04/25/2021   IR ANGIO VERTEBRAL SEL VERTEBRAL UNI R MOD SED  04/25/2021   IR ANGIO/SPINAL LEFT  04/25/2021   IR ANGIO/SPINAL LEFT  04/25/2021   IR ANGIO/SPINAL LEFT  04/25/2021   IR ANGIO/SPINAL LEFT  04/25/2021   IR ANGIO/SPINAL LEFT  04/25/2021   IR ANGIO/SPINAL LEFT  04/25/2021   IR ANGIO/SPINAL LEFT  04/25/2021   IR ANGIO/SPINAL LEFT  04/25/2021   IR ANGIO/SPINAL LEFT  04/25/2021   IR ANGIO/SPINAL LEFT  04/25/2021   IR ANGIO/SPINAL LEFT  04/25/2021   IR ANGIO/SPINAL LEFT  04/25/2021   IR ANGIO/SPINAL LEFT  04/25/2021    IR ANGIO/SPINAL RIGHT  04/25/2021   IR ANGIO/SPINAL RIGHT  04/25/2021   IR ANGIO/SPINAL RIGHT  04/25/2021   IR ANGIO/SPINAL RIGHT  04/25/2021   IR ANGIO/SPINAL RIGHT  04/25/2021   IR ANGIO/SPINAL RIGHT  04/25/2021   IR ANGIO/SPINAL RIGHT  04/25/2021   IR ANGIO/SPINAL RIGHT  04/25/2021   IR ANGIO/SPINAL RIGHT  04/25/2021   IR ANGIO/SPINAL RIGHT  04/25/2021   IR ANGIO/SPINAL RIGHT  04/25/2021   IR ANGIO/SPINAL RIGHT  04/25/2021   IR ANGIO/SPINAL RIGHT  04/25/2021   IR ANGIO/SPINAL RIGHT  04/25/2021   IR ANGIOGRAM EXTREMITY BILATERAL  04/25/2021   IR RADIOLOGIST EVAL & MGMT  03/21/2021   RADIOLOGY WITH ANESTHESIA N/A 04/25/2021   Procedure: IR WITH ANESTHESIA SPINAL ANGIOGRAM;  Surgeon: Luanne Bras, MD;  Location: [place];  Service: Radiology;  Laterality: N/A;   Patient Active Problem List   Diagnosis Date Noted   Acute gout of right ankle 09/10/2022   Anemia of chronic disease 09/09/2022   Adjustment disorder with depressed mood 09/04/2022   Debility 08/30/2022   Spinal cord disorder ([place]) 08/29/2022   AKI (acute kidney injury) ([place])  08/26/2022   Normocytic anemia 08/26/2022   Left leg pain 08/26/2022   Pleural effusion 08/26/2022   Anxiety 08/26/2022   GERD (gastroesophageal reflux disease) 08/26/2022   Essential hypertension 08/26/2022   Hyperlipidemia 08/26/2022   Leg weakness 08/25/2022   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[place]) 05/21/2021   Anti-RNP antibodies present 05/21/2021   Weakness of both lower extremities 02/05/2021   Carcinoma in situ 01/03/2021   Neurogenic bladder 12/08/2020   Neurogenic bowel 12/08/2020   Paraplegia following spinal cord injury ([place]) 12/08/2020   Wheelchair dependence 12/08/2020   Spasticity 12/08/2020   Chronic bilateral thoracic back pain 12/08/2020    ONSET DATE: 09/09/2022 (referral date)  REFERRING DIAG: R53.81 (ICD-10-CM) - Other malaise  THERAPY DIAG:  Muscle weakness (generalized)  Other abnormalities of gait and mobility  Abnormal  posture  Difficulty in walking, not elsewhere classified  Unsteadiness on feet  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[facility])  Rationale for Evaluation and Treatment: Rehabilitation  SUBJECTIVE:                                                                                                                                                                                             SUBJECTIVE STATEMENT: Pt reports his usual chronic pain today, no falls or other acute changes. Pt did call [name] today, has an appointment 3/8 or 3/9 to finally get his toe cap.  Pt accompanied by: self, dad  PERTINENT HISTORY: Transverse myelitis, neurogenic bowel and bladder  From hospital notes:  presented to [facility] ED on 08/25/2022 complaining of several history of BLE weakness, left greater than right. NS and neurology consulted. MRI with chronic changes. Neurology confirmed no TM diagnosis and MRI of complete spine negative for cord compression and stable cord hemorrhage as compared to July 2022. Complained of right ankle pain on 11/27 and started on colchicine for acute gout flare. Also complained of lingering cough and had mild fever to 100.6. Chest x-ray and blood cultures performed. Possible LLL pneumonia.   Admitted to inpatient rehab from 08/29/2022 - 09/10/2022.  Pt was ambulating at CGA level w/ toe cap.  Transferring supervision level. PAIN:  Are you having pain? Yes: NPRS scale: 3/10 Pain location: low back Pain description: achy Aggravating factors: activity Relieving factors: resting  PRECAUTIONS: Fall  WEIGHT BEARING RESTRICTIONS: No  FALLS: Has patient fallen in last 6 months? No  LIVING ENVIRONMENT: Lives with:  mother and father Lives in: House/apartment Stairs: Yes: Internal: 12 steps; on right going up and on left going up and External: 1 steps; none, pt has chair lift at home for stairs. Has following equipment at home: Gilford Rile - 2 wheeled, Wheelchair (  manual), and bed side commode  PLOF:  Independent with dressing, self care, transfer, parents help with cooking, cleaning.   PATIENT GOALS: "To get back to where we were when I discharged."  OBJECTIVE:  Measures assessed at initial evaluation unless otherwise noted:  LOWER EXTREMITY MMT:    MMT Right Eval Left Eval  Hip flexion 1/5 3/5  Hip extension    Hip abduction 1+/5 3+/5  Hip adduction 2+/5 3+/5  Hip internal rotation    Hip external rotation    Knee flexion    Knee extension    Ankle dorsiflexion    Ankle plantarflexion    Ankle inversion    Ankle eversion    (Blank rows = not tested)   TODAY'S TREATMENT:                                                                                                                               TherEx: SciFit multi-peaks level 4 for 8 minutes using BUE/BLEs for neural priming for reciprocal movement, dynamic cardiovascular warmup and increased amplitude of stepping. RPE of 4/10 following activity.  Added to HEP for RLE strengthening to improve gait impairments: Supine bridge x 10 reps Supine RLE SL bridge x 10 reps Supine bridge with pillow between knees x 10 reps for hip add  Standing TKE with GTB x 10 reps  See bolded below for additions to HEP  Gait: Gait pattern: step through pattern, decreased stride length, decreased hip/knee flexion- Right, decreased ankle dorsiflexion- Right, genu recurvatum- Right, and poor foot clearance- Right Distance walked: 115 ft Assistive device utilized: Quad cane large base Level of assistance: CGA Comments: trial gait again with [facility] initially on R side; decreased gait speed with LBQC as compared to RW; followed by trial of use of LBQC on L side--even more decreased gait speed as compared to L side  Provided handout for where to purchase [facility] online, encouraged pt to practice with it on his L side to get more comfortable with this as it should provide more stability vs using it on his R side   PATIENT EDUCATION: Education  details: Continue HEP, added to HEP Person educated: Patient Education method: Explanation and Handouts Education comprehension: verbalized understanding  HOME EXERCISE PROGRAM: Access Code: [email or web address] URL: [email or web address] Date: 10/21/2022 Prepared by: Excell Seltzer  Exercises - Supine Heel Slide with Strap  - 1 x daily - 7 x weekly - 3 sets - 10 reps - Clamshell  - 1 x daily - 7 x weekly - 3 sets - 10 reps - Sit to Stand Without Arm Support  - 1 x daily - 7 x weekly - 3 sets - 10 reps - Side Stepping with Counter Support  - 1 x daily - 7 x weekly - 3 sets - 10 reps - Standing March with Counter Support  - 1 x daily - 7 x weekly - 3 sets - 10 reps - Standing Hip Abduction with Counter  Support  - 1 x daily - 7 x weekly - 3 sets - 10 reps - Single Leg Bridge  - 1 x daily - 7 x weekly - 3 sets - 10 reps - Supine Bridge with Mini Swiss Ball Between Knees  - 1 x daily - 7 x weekly - 3 sets - 10 reps - Standing Terminal Knee Extension with Resistance  - 1 x daily - 7 x weekly - 3 sets - 10 reps   GOALS: Goals reviewed with patient? Yes   SHORT TERM GOALS: Target date: 11/08/2022   Patient will be able to ambulate 500' with RW without needing a rest break to improve functional walking endurance. Baseline: 120', 345 ft with RW mod I (2/5) Goal status: IN PROGRESS   2.  Pt will decrease 5xSTS to </=40 seconds w/ BUE support in order to demonstrate decreased risk for falls and improved functional bilateral LE strength and power.  Baseline: 45.25 seconds w/ BUE, 35.97 sec with BUE (2/5) Goal status: MET   3.  Pt will demonstrate TUG of </=50 seconds in order to decrease risk of falls and improve functional mobility using LRAD. Baseline: 61 seconds w/ RW no AFO/toe cap, 36.5 sec with RW with no AFO/toe cap (2/5) Goal status: MET  4.  Pt will demonstrate a gait speed of >/=0.6 feet/sec in order to decrease risk for falls. Baseline: 0.51 ft/sec, 1.26 ft/sec with RW mod I  (2/5) Goal status: MET   LONG TERM GOALS: Target date: 12/06/2022   Patient will be able to ambulate >/=800' with RW without needing a rest break to improve functional walking endurance and access to community. Baseline: 120' w/ RW no AFO/toe cap Goal status: INITIAL   2.  Pt will decrease 5xSTS to </=35 seconds w/ BUE support in order to demonstrate decreased risk for falls and improved functional bilateral LE strength and power.  Baseline: 45.25 seconds w/ BUE Goal status: INITIAL   3.  Pt will demonstrate TUG of </=40 seconds in order to decrease risk of falls and improve functional mobility using LRAD. Baseline: 61 seconds w/ RW no AFO/toe cap Goal status: INITIAL  4.  Pt will demonstrate a gait speed of >/=0.7 feet/sec using LRAD in order to decrease risk for falls. Baseline: 0.51 ft/sec Goal status: INITIAL    ASSESSMENT:  CLINICAL IMPRESSION: Emphasis of skilled PT session on working gait training with [facility] again as well as adding RLE strengthening exercises to HEP to improve ongoing gait deviations. Pt does exhibit decreased gait speed with LBQC as compared to with RW as well as exhibits decreased R hip and knee flexion, decreased R ankle DF, and essentially just scoots RLE along the floor with onset of fatigue. Pt does continue to benefit from RLE strengthening to improve his balance, his gait mechanics, and decrease his fall risk. Continue POC.    OBJECTIVE IMPAIRMENTS: Abnormal gait, decreased activity tolerance, decreased balance, decreased endurance, decreased mobility, difficulty walking, decreased ROM, decreased strength, improper body mechanics, and postural dysfunction.   ACTIVITY LIMITATIONS: carrying, lifting, bending, squatting, stairs, transfers, and locomotion level  PARTICIPATION LIMITATIONS: meal prep, cleaning, laundry, driving, community activity, and occupation  PERSONAL FACTORS: Age, Fitness, Past/current experiences, Time since onset of  injury/illness/exacerbation, Transportation, and 1 comorbidity: ongoing question of transverse myelitis vs other spinal cord pathology  are also affecting patient's functional outcome.   REHAB POTENTIAL: Good  CLINICAL DECISION MAKING: Evolving/moderate complexity  EVALUATION COMPLEXITY: Moderate  PLAN:  PT FREQUENCY: 1x/week  PT  DURATION: 8 weeks  PLANNED INTERVENTIONS: Therapeutic exercises, Therapeutic activity, Neuromuscular re-education, Balance training, Gait training, Patient/Family education, Self Care, Stair training, Vestibular training, Orthotic/Fit training, DME instructions, Manual therapy, and Re-evaluation  PLAN FOR NEXT SESSION: appt with [name] 3/8 or 3/9, add to HEP as needed, R strengthening/NMR, static unsupported balance, endurance-SciFit, LBQC on left   Excell Seltzer, PT, DPT, CSRS 11/25/2022, 2:02 PM

## 2022-12-02 ENCOUNTER — Ambulatory Visit: Payer: Medicare Other | Attending: Physician Assistant | Admitting: Physical Therapy

## 2022-12-02 ENCOUNTER — Encounter: Payer: Self-pay | Admitting: Physical Therapy

## 2022-12-02 DIAGNOSIS — R262 Difficulty in walking, not elsewhere classified: Secondary | ICD-10-CM | POA: Diagnosis present

## 2022-12-02 DIAGNOSIS — R2681 Unsteadiness on feet: Secondary | ICD-10-CM | POA: Diagnosis present

## 2022-12-02 DIAGNOSIS — R293 Abnormal posture: Secondary | ICD-10-CM | POA: Insufficient documentation

## 2022-12-02 DIAGNOSIS — M6281 Muscle weakness (generalized): Secondary | ICD-10-CM | POA: Diagnosis present

## 2022-12-02 DIAGNOSIS — G373 Acute transverse myelitis in demyelinating disease of central nervous system: Secondary | ICD-10-CM | POA: Diagnosis present

## 2022-12-02 DIAGNOSIS — R2689 Other abnormalities of gait and mobility: Secondary | ICD-10-CM | POA: Diagnosis present

## 2022-12-02 NOTE — Therapy (Signed)
OUTPATIENT PHYSICAL THERAPY NEURO TREATMENT   Patient Name: Maurice Cruz MRN: [id number] DOB:1966/06/04, 57 y.o., male Today's Date: 12/02/2022   PCP: Clayborne Dana [facility]) REFERRING PROVIDER: Barbie Banner, PA-C   END OF SESSION:  PT End of Session - 12/02/22 1323     Visit Number 8    Number of Visits 9    Date for PT Re-Evaluation 12/13/22   pushed out due to scheduling delays   Authorization Type MEDICARE PART A AND B    Progress Note Due on Visit 10    PT Start Time 1319    PT Stop Time 1400    PT Time Calculation (min) 41 min    Equipment Utilized During Treatment Gait belt    Activity Tolerance Patient tolerated treatment well    Behavior During Therapy WFL for tasks assessed/performed                Past Medical History:  Diagnosis Date   Anxiety    Asthma    as a child   Depression    ED (erectile dysfunction)    GERD (gastroesophageal reflux disease)    Hx of spinal cord injury 03/2020   Hypertension    Neurogenic bladder    self caths   Neurogenic bowel    has to be digitially stimulated - 04/24/21   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[place])    Past Surgical History:  Procedure Laterality Date   Anorectal biospy  02/09/2021   Colon Biospy  02/09/2021   IR ANGIO INTRA EXTRACRAN SEL COM CAROTID INNOMINATE BILAT MOD SED  04/25/2021   IR ANGIO VERTEBRAL SEL SUBCLAVIAN INNOMINATE UNI L MOD SED  04/25/2021   IR ANGIO VERTEBRAL SEL VERTEBRAL UNI R MOD SED  04/25/2021   IR ANGIO/SPINAL LEFT  04/25/2021   IR ANGIO/SPINAL LEFT  04/25/2021   IR ANGIO/SPINAL LEFT  04/25/2021   IR ANGIO/SPINAL LEFT  04/25/2021   IR ANGIO/SPINAL LEFT  04/25/2021   IR ANGIO/SPINAL LEFT  04/25/2021   IR ANGIO/SPINAL LEFT  04/25/2021   IR ANGIO/SPINAL LEFT  04/25/2021   IR ANGIO/SPINAL LEFT  04/25/2021   IR ANGIO/SPINAL LEFT  04/25/2021   IR ANGIO/SPINAL LEFT  04/25/2021   IR ANGIO/SPINAL LEFT  04/25/2021   IR ANGIO/SPINAL LEFT  04/25/2021    IR ANGIO/SPINAL RIGHT  04/25/2021   IR ANGIO/SPINAL RIGHT  04/25/2021   IR ANGIO/SPINAL RIGHT  04/25/2021   IR ANGIO/SPINAL RIGHT  04/25/2021   IR ANGIO/SPINAL RIGHT  04/25/2021   IR ANGIO/SPINAL RIGHT  04/25/2021   IR ANGIO/SPINAL RIGHT  04/25/2021   IR ANGIO/SPINAL RIGHT  04/25/2021   IR ANGIO/SPINAL RIGHT  04/25/2021   IR ANGIO/SPINAL RIGHT  04/25/2021   IR ANGIO/SPINAL RIGHT  04/25/2021   IR ANGIO/SPINAL RIGHT  04/25/2021   IR ANGIO/SPINAL RIGHT  04/25/2021   IR ANGIO/SPINAL RIGHT  04/25/2021   IR ANGIOGRAM EXTREMITY BILATERAL  04/25/2021   IR RADIOLOGIST EVAL & MGMT  03/21/2021   RADIOLOGY WITH ANESTHESIA N/A 04/25/2021   Procedure: IR WITH ANESTHESIA SPINAL ANGIOGRAM;  Surgeon: Luanne Bras, MD;  Location: [facility];  Service: Radiology;  Laterality: N/A;   Patient Active Problem List   Diagnosis Date Noted   Acute gout of right ankle 09/10/2022   Anemia of chronic disease 09/09/2022   Adjustment disorder with depressed mood 09/04/2022   Debility 08/30/2022   Spinal cord disorder ([place]) 08/29/2022   AKI (acute kidney injury) ([place])  08/26/2022   Normocytic anemia 08/26/2022   Left leg pain 08/26/2022   Pleural effusion 08/26/2022   Anxiety 08/26/2022   GERD (gastroesophageal reflux disease) 08/26/2022   Essential hypertension 08/26/2022   Hyperlipidemia 08/26/2022   Leg weakness 08/25/2022   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[place]) 05/21/2021   Anti-RNP antibodies present 05/21/2021   Weakness of both lower extremities 02/05/2021   Carcinoma in situ 01/03/2021   Neurogenic bladder 12/08/2020   Neurogenic bowel 12/08/2020   Paraplegia following spinal cord injury ([place]) 12/08/2020   Wheelchair dependence 12/08/2020   Spasticity 12/08/2020   Chronic bilateral thoracic back pain 12/08/2020    ONSET DATE: 09/09/2022 (referral date)  REFERRING DIAG: R53.81 (ICD-10-CM) - Other malaise  THERAPY DIAG:  Muscle weakness (generalized)  Other abnormalities of gait and mobility  Abnormal  posture  Unsteadiness on feet  Difficulty in walking, not elsewhere classified  Rationale for Evaluation and Treatment: Rehabilitation  SUBJECTIVE:                                                                                                                                                                                             SUBJECTIVE STATEMENT: Appt remains w/ [name] on 3/8.  He brought newly purchased [facility] into session today for use, but it is a right only cane.  He states he is being treated for a bladder infection and feeling better.  Pt accompanied by: self, dad  PERTINENT HISTORY: Transverse myelitis, neurogenic bowel and bladder  From hospital notes:  presented to [facility] ED on 08/25/2022 complaining of several history of BLE weakness, left greater than right. NS and neurology consulted. MRI with chronic changes. Neurology confirmed no TM diagnosis and MRI of complete spine negative for cord compression and stable cord hemorrhage as compared to July 2022. Complained of right ankle pain on 11/27 and started on colchicine for acute gout flare. Also complained of lingering cough and had mild fever to 100.6. Chest x-ray and blood cultures performed. Possible LLL pneumonia.   Admitted to inpatient rehab from 08/29/2022 - 09/10/2022.  Pt was ambulating at CGA level w/ toe cap.  Transferring supervision level. PAIN:  Endorses same pain from prior. Are you having pain? Yes: NPRS scale: 3/10 Pain location: low back Pain description: achy Aggravating factors: activity Relieving factors: resting  PRECAUTIONS: Fall  WEIGHT BEARING RESTRICTIONS: No  FALLS: Has patient fallen in last 6 months? No  LIVING ENVIRONMENT: Lives with:  mother and father Lives in: House/apartment Stairs: Yes: Internal: 12 steps; on right going up and on left going up and External: 1 steps; none, pt has chair lift at home for stairs. Has  following equipment at home: Gilford Rile - 2 wheeled, Wheelchair  (manual), and bed side commode  PLOF: Independent with dressing, self care, transfer, parents help with cooking, cleaning.   PATIENT GOALS: "To get back to where we were when I discharged."  OBJECTIVE:  Measures assessed at initial evaluation unless otherwise noted:  LOWER EXTREMITY MMT:    MMT Right Eval Left Eval  Hip flexion 1/5 3/5  Hip extension    Hip abduction 1+/5 3+/5  Hip adduction 2+/5 3+/5  Hip internal rotation    Hip external rotation    Knee flexion    Knee extension    Ankle dorsiflexion    Ankle plantarflexion    Ankle inversion    Ankle eversion    (Blank rows = not tested)   TODAY'S TREATMENT:                                                                                                                               TherEx: SciFit multi-peaks level 7.0 for 8 minutes using BUE/BLEs for neural priming for reciprocal movement, dynamic cardiovascular warmup and increased amplitude of stepping. RPE of 5-6/10 following activity.  Gait: Gait pattern: step through pattern, decreased stride length, decreased hip/knee flexion- Right, decreased ankle dorsiflexion- Right, genu recurvatum- Right, and poor foot clearance- Right Distance walked: 115 ft + 200 ft + 115 ft Assistive device utilized: Quad cane large base Level of assistance: SBA Comments: Continued gait training w/ LBQC on left side, initially attempting 2 continuous laps in gym, but leg suddenly fatiguing limiting to single lap.  Re-attempted x200' w/ toe cap added to cane on left side.  Pt able to ambulate continuously w/ toe cap farther than prior lap and improved swing phase of gait maintaining improved right foot pointed forward vs ER.  Repeated x115' w/ LBQC on right and to cap w/ patient having immediate right toe catch even with correctly oriented base of cane.  Discussed natural fatigue of RLE making it unpredictable the amount of ER and how this affects swing phase when the cane is on the  right.  Discussed returning cane and purchasing a reversible LBQC or left oriented cane.  RAMP:  Level of Assistance: SBA and CGA Assistive device utilized: Quad cane large base Ramp Comments: Pt does better on descent than ascent without overt LOB throughout task.  Min cues for sequencing.  One instance of CGA at top of ramp due to RLE clearance.  STAIRS:  Level of Assistance: Modified independence  Stair Negotiation Technique: Step to Pattern Forwards With use of AD: LBQC  with Single Rail on Right  Number of Stairs: 4   Height of Stairs: 6"  Comments: Pt is able to ascend and descent stairs w/o need for assistance, min cues for cane placement and sequencing on descent to improve RLE engagement.    PATIENT EDUCATION: Education details: Practice w/ NBQC in home using on left side focused on improving gait speed  gradually along with general efficiency. Person educated: Patient Education method: Explanation and Handouts Education comprehension: verbalized understanding  HOME EXERCISE PROGRAM: Access Code: [email or web address] URL: [email or web address] Date: 10/21/2022 Prepared by: Excell Seltzer  Exercises - Supine Heel Slide with Strap  - 1 x daily - 7 x weekly - 3 sets - 10 reps - Clamshell  - 1 x daily - 7 x weekly - 3 sets - 10 reps - Sit to Stand Without Arm Support  - 1 x daily - 7 x weekly - 3 sets - 10 reps - Side Stepping with Counter Support  - 1 x daily - 7 x weekly - 3 sets - 10 reps - Standing March with Counter Support  - 1 x daily - 7 x weekly - 3 sets - 10 reps - Standing Hip Abduction with Counter Support  - 1 x daily - 7 x weekly - 3 sets - 10 reps - Single Leg Bridge  - 1 x daily - 7 x weekly - 3 sets - 10 reps - Supine Bridge with Mini Swiss Ball Between Knees  - 1 x daily - 7 x weekly - 3 sets - 10 reps - Standing Terminal Knee Extension with Resistance  - 1 x daily - 7 x weekly - 3 sets - 10 reps   GOALS: Goals reviewed with patient? Yes   SHORT  TERM GOALS: Target date: 11/08/2022   Patient will be able to ambulate 500' with RW without needing a rest break to improve functional walking endurance. Baseline: 120', 345 ft with RW mod I (2/5) Goal status: IN PROGRESS   2.  Pt will decrease 5xSTS to </=40 seconds w/ BUE support in order to demonstrate decreased risk for falls and improved functional bilateral LE strength and power.  Baseline: 45.25 seconds w/ BUE, 35.97 sec with BUE (2/5) Goal status: MET   3.  Pt will demonstrate TUG of </=50 seconds in order to decrease risk of falls and improve functional mobility using LRAD. Baseline: 61 seconds w/ RW no AFO/toe cap, 36.5 sec with RW with no AFO/toe cap (2/5) Goal status: MET  4.  Pt will demonstrate a gait speed of >/=0.6 feet/sec in order to decrease risk for falls. Baseline: 0.51 ft/sec, 1.26 ft/sec with RW mod I (2/5) Goal status: MET   LONG TERM GOALS: Target date: 12/06/2022   Patient will be able to ambulate >/=800' with RW without needing a rest break to improve functional walking endurance and access to community. Baseline: 120' w/ RW no AFO/toe cap Goal status: INITIAL   2.  Pt will decrease 5xSTS to </=35 seconds w/ BUE support in order to demonstrate decreased risk for falls and improved functional bilateral LE strength and power.  Baseline: 45.25 seconds w/ BUE Goal status: INITIAL   3.  Pt will demonstrate TUG of </=40 seconds in order to decrease risk of falls and improve functional mobility using LRAD. Baseline: 61 seconds w/ RW no AFO/toe cap Goal status: INITIAL  4.  Pt will demonstrate a gait speed of >/=0.7 feet/sec using LRAD in order to decrease risk for falls. Baseline: 0.51 ft/sec Goal status: INITIAL    ASSESSMENT:  CLINICAL IMPRESSION: Pt does well with both improved speed and mechanics of gait using simulated toe cap and LBQC on left this visit.  His efficiency of gait continues to be improved with toe cap with notably decreased RLE fatigue  during swing phase.  Trialed ramp and stairs and a rail with patient able to navigate  this obstacles with cautious gait, but no obvious loss of balance.  He continues to benefit from skilled PT to address gait deficits and improve use of [facility] with plan to assess LTGs at next session and determine further plan of care.  OBJECTIVE IMPAIRMENTS: Abnormal gait, decreased activity tolerance, decreased balance, decreased endurance, decreased mobility, difficulty walking, decreased ROM, decreased strength, improper body mechanics, and postural dysfunction.   ACTIVITY LIMITATIONS: carrying, lifting, bending, squatting, stairs, transfers, and locomotion level  PARTICIPATION LIMITATIONS: meal prep, cleaning, laundry, driving, community activity, and occupation  PERSONAL FACTORS: Age, Fitness, Past/current experiences, Time since onset of injury/illness/exacerbation, Transportation, and 1 comorbidity: ongoing question of transverse myelitis vs other spinal cord pathology  are also affecting patient's functional outcome.   REHAB POTENTIAL: Good  CLINICAL DECISION MAKING: Evolving/moderate complexity  EVALUATION COMPLEXITY: Moderate  PLAN:  PT FREQUENCY: 1x/week  PT DURATION: 8 weeks  PLANNED INTERVENTIONS: Therapeutic exercises, Therapeutic activity, Neuromuscular re-education, Balance training, Gait training, Patient/Family education, Self Care, Stair training, Vestibular training, Orthotic/Fit training, DME instructions, Manual therapy, and Re-evaluation  PLAN FOR NEXT SESSION: ASSESS LTGs- d/c? Pt may want to do additional few visits w/ personal cane gait training outdoors w/ new toe cap if installed. appt with [name] 3/8 or 3/9-did they add toe cap at appt?, R strengthening/NMR, static unsupported balance, endurance-SciFit, LBQC on left, did he purchase reversible cane?    Bary Richard, PT, DPT 12/02/2022, 3:15 PM

## 2022-12-10 ENCOUNTER — Ambulatory Visit: Payer: Medicare Other | Admitting: Physical Therapy

## 2022-12-10 DIAGNOSIS — G373 Acute transverse myelitis in demyelinating disease of central nervous system: Secondary | ICD-10-CM

## 2022-12-10 DIAGNOSIS — M6281 Muscle weakness (generalized): Secondary | ICD-10-CM

## 2022-12-10 DIAGNOSIS — R2681 Unsteadiness on feet: Secondary | ICD-10-CM

## 2022-12-10 DIAGNOSIS — R262 Difficulty in walking, not elsewhere classified: Secondary | ICD-10-CM

## 2022-12-10 DIAGNOSIS — R293 Abnormal posture: Secondary | ICD-10-CM

## 2022-12-10 DIAGNOSIS — R2689 Other abnormalities of gait and mobility: Secondary | ICD-10-CM

## 2022-12-10 NOTE — Therapy (Signed)
OUTPATIENT PHYSICAL THERAPY NEURO TREATMENT-RECERTIFICATION   Patient Name: Maurice Cruz MRN: [id number] DOB:Aug 22, 1966, 57 y.o., male Today's Date: 12/13/2022   PCP: [facility]) REFERRING PROVIDER: Barbie Banner, PA-C   END OF SESSION:    PT End of Session - 12/10/22 0748     Visit Number 9    Number of Visits 15   recert   Date for PT Re-Evaluation 02/06/23   pushed out due to scheduling delays   Authorization Type MEDICARE PART A AND B    Progress Note Due on Visit 10    Equipment Utilized During Treatment Gait belt    Activity Tolerance Patient tolerated treatment well    Behavior During Therapy [facility] for tasks assessed/performed             Past Medical History:  Diagnosis Date   Anxiety    Asthma    as a child   Depression    ED (erectile dysfunction)    GERD (gastroesophageal reflux disease)    Hx of spinal cord injury 03/2020   Hypertension    Neurogenic bladder    self caths   Neurogenic bowel    has to be digitially stimulated - 04/24/21   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[place])    Past Surgical History:  Procedure Laterality Date   Anorectal biospy  02/09/2021   Colon Biospy  02/09/2021   IR ANGIO INTRA EXTRACRAN SEL COM CAROTID INNOMINATE BILAT MOD SED  04/25/2021   IR ANGIO VERTEBRAL SEL SUBCLAVIAN INNOMINATE UNI L MOD SED  04/25/2021   IR ANGIO VERTEBRAL SEL VERTEBRAL UNI R MOD SED  04/25/2021   IR ANGIO/SPINAL LEFT  04/25/2021   IR ANGIO/SPINAL LEFT  04/25/2021   IR ANGIO/SPINAL LEFT  04/25/2021   IR ANGIO/SPINAL LEFT  04/25/2021   IR ANGIO/SPINAL LEFT  04/25/2021   IR ANGIO/SPINAL LEFT  04/25/2021   IR ANGIO/SPINAL LEFT  04/25/2021   IR ANGIO/SPINAL LEFT  04/25/2021   IR ANGIO/SPINAL LEFT  04/25/2021   IR ANGIO/SPINAL LEFT  04/25/2021   IR ANGIO/SPINAL LEFT  04/25/2021   IR ANGIO/SPINAL LEFT  04/25/2021   IR ANGIO/SPINAL LEFT  04/25/2021   IR ANGIO/SPINAL RIGHT  04/25/2021   IR ANGIO/SPINAL RIGHT   04/25/2021   IR ANGIO/SPINAL RIGHT  04/25/2021   IR ANGIO/SPINAL RIGHT  04/25/2021   IR ANGIO/SPINAL RIGHT  04/25/2021   IR ANGIO/SPINAL RIGHT  04/25/2021   IR ANGIO/SPINAL RIGHT  04/25/2021   IR ANGIO/SPINAL RIGHT  04/25/2021   IR ANGIO/SPINAL RIGHT  04/25/2021   IR ANGIO/SPINAL RIGHT  04/25/2021   IR ANGIO/SPINAL RIGHT  04/25/2021   IR ANGIO/SPINAL RIGHT  04/25/2021   IR ANGIO/SPINAL RIGHT  04/25/2021   IR ANGIO/SPINAL RIGHT  04/25/2021   IR ANGIOGRAM EXTREMITY BILATERAL  04/25/2021   IR RADIOLOGIST EVAL & MGMT  03/21/2021   RADIOLOGY WITH ANESTHESIA N/A 04/25/2021   Procedure: IR WITH ANESTHESIA SPINAL ANGIOGRAM;  Surgeon: Luanne Bras, MD;  Location: [facility];  Service: Radiology;  Laterality: N/A;   Patient Active Problem List   Diagnosis Date Noted   Acute gout of right ankle 09/10/2022   Anemia of chronic disease 09/09/2022   Adjustment disorder with depressed mood 09/04/2022   Debility 08/30/2022   Spinal cord disorder ([place]) 08/29/2022   AKI (acute kidney injury) ([place]) 08/26/2022   Normocytic anemia 08/26/2022   Left leg pain 08/26/2022   Pleural effusion 08/26/2022   Anxiety 08/26/2022  GERD (gastroesophageal reflux disease) 08/26/2022   Essential hypertension 08/26/2022   Hyperlipidemia 08/26/2022   Leg weakness 08/25/2022   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[place]) 05/21/2021   Anti-RNP antibodies present 05/21/2021   Weakness of both lower extremities 02/05/2021   Carcinoma in situ 01/03/2021   Neurogenic bladder 12/08/2020   Neurogenic bowel 12/08/2020   Paraplegia following spinal cord injury ([place]) 12/08/2020   Wheelchair dependence 12/08/2020   Spasticity 12/08/2020   Chronic bilateral thoracic back pain 12/08/2020    ONSET DATE: 09/09/2022 (referral date)  REFERRING DIAG: R53.81 (ICD-10-CM) - Other malaise  THERAPY DIAG:  Muscle weakness (generalized)  Abnormal posture  Unsteadiness on feet  Difficulty in walking, not elsewhere classified  Other abnormalities of  gait and mobility  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[facility])  Rationale for Evaluation and Treatment: Rehabilitation  SUBJECTIVE:                                                                                                                                                                                             SUBJECTIVE STATEMENT: Pt reports that he ordered another cane but it was also not adjustable, going to look at a local medical supply store for the correct cane. Pt reports no falls or other acute changes since last visit. Pt experiencing his normal chronic back pain. Pt finally goes to [name] on Friday for his toe cap.  Pt accompanied by: self, friend  PERTINENT HISTORY: Transverse myelitis, neurogenic bowel and bladder  From hospital notes:  presented to [facility] ED on 08/25/2022 complaining of several history of BLE weakness, left greater than right. NS and neurology consulted. MRI with chronic changes. Neurology confirmed no TM diagnosis and MRI of complete spine negative for cord compression and stable cord hemorrhage as compared to July 2022. Complained of right ankle pain on 11/27 and started on colchicine for acute gout flare. Also complained of lingering cough and had mild fever to 100.6. Chest x-ray and blood cultures performed. Possible LLL pneumonia.   Admitted to inpatient rehab from 08/29/2022 - 09/10/2022.  Pt was ambulating at CGA level w/ toe cap.  Transferring supervision level. PAIN:  Endorses same pain from prior. Are you having pain? Yes: NPRS scale: 3/10 Pain location: low back Pain description: achy Aggravating factors: activity Relieving factors: resting  PRECAUTIONS: Fall  WEIGHT BEARING RESTRICTIONS: No  FALLS: Has patient fallen in last 6 months? No  LIVING ENVIRONMENT: Lives with:  mother and father Lives in: House/apartment Stairs: Yes: Internal: 12 steps; on right going up and on left going up and External: 1 steps; none, pt has chair lift at home for  stairs. Has following  equipment at home: Gilford Rile - 2 wheeled, Wheelchair (manual), and bed side commode  PLOF: Independent with dressing, self care, transfer, parents help with cooking, cleaning.   PATIENT GOALS: "To get back to where we were when I discharged."  OBJECTIVE:  Measures assessed at initial evaluation unless otherwise noted:  LOWER EXTREMITY MMT:    MMT Right Eval Left Eval  Hip flexion 1/5 3/5  Hip extension    Hip abduction 1+/5 3+/5  Hip adduction 2+/5 3+/5  Hip internal rotation    Hip external rotation    Knee flexion    Knee extension    Ankle dorsiflexion    Ankle plantarflexion    Ankle inversion    Ankle eversion    (Blank rows = not tested)   TODAY'S TREATMENT:                                                                                                                               TherEx: BLE leg press with 60#, 3 x 10 reps  Gait: Gait pattern: decreased hip/knee flexion- Left and decreased ankle dorsiflexion- Left Distance walked: 800 ft Assistive device utilized: Walker - 2 wheeled Level of assistance: Modified independence Comments: RPE 7/10, decreased speed with onset of fatigue   TherAct:  [facility] PT Assessment - 12/13/22 1000       Ambulation/Gait   Gait velocity 32.8 ft over 22.5 sec = 1.46 ft/sec      Standardized Balance Assessment   Standardized Balance Assessment Five Times Sit to Stand;Timed Up and Go Test    Five times sit to stand comments  36.78   BUE pushing from mat table     Timed Up and Go Test   TUG Normal TUG    Normal TUG (seconds) 29.75   with walker            PATIENT EDUCATION: Education details: continue HEP, PT POC Person educated: Patient Education method: Explanation Education comprehension: verbalized understanding  HOME EXERCISE PROGRAM: Access Code: [email or web address] URL: [email or web address] Date: 10/21/2022 Prepared by: Excell Seltzer  Exercises - Supine Heel Slide with Strap  -  1 x daily - 7 x weekly - 3 sets - 10 reps - Clamshell  - 1 x daily - 7 x weekly - 3 sets - 10 reps - Sit to Stand Without Arm Support  - 1 x daily - 7 x weekly - 3 sets - 10 reps - Side Stepping with Counter Support  - 1 x daily - 7 x weekly - 3 sets - 10 reps - Standing March with Counter Support  - 1 x daily - 7 x weekly - 3 sets - 10 reps - Standing Hip Abduction with Counter Support  - 1 x daily - 7 x weekly - 3 sets - 10 reps - Single Leg Bridge  - 1 x daily - 7 x weekly - 3 sets - 10 reps - Supine Bridge with [employer]  Between Knees  - 1 x daily - 7 x weekly - 3 sets - 10 reps - Standing Terminal Knee Extension with Resistance  - 1 x daily - 7 x weekly - 3 sets - 10 reps   Walking Program: Walk with your family. Walk during cooler times of the day. Starting this week, walk 5 min per day for 7 days. Following week add 5 minutes to your total time. Week 1: 5 min Week 2: 10 min Week 3: 15 min Week 4: 20 min...Marland KitchenMarland KitchenUntil you can walk to 30 min per day    GOALS: Goals reviewed with patient? Yes   SHORT TERM GOALS: Target date: 11/08/2022   Patient will be able to ambulate 500' with RW without needing a rest break to improve functional walking endurance. Baseline: 120', 345 ft with RW mod I (2/5) Goal status: IN PROGRESS   2.  Pt will decrease 5xSTS to </=40 seconds w/ BUE support in order to demonstrate decreased risk for falls and improved functional bilateral LE strength and power.  Baseline: 45.25 seconds w/ BUE, 35.97 sec with BUE (2/5) Goal status: MET   3.  Pt will demonstrate TUG of </=50 seconds in order to decrease risk of falls and improve functional mobility using LRAD. Baseline: 61 seconds w/ RW no AFO/toe cap, 36.5 sec with RW with no AFO/toe cap (2/5) Goal status: MET  4.  Pt will demonstrate a gait speed of >/=0.6 feet/sec in order to decrease risk for falls. Baseline: 0.51 ft/sec, 1.26 ft/sec with RW mod I (2/5) Goal status: MET   LONG TERM GOALS: Target  date: 12/06/2022   Patient will be able to ambulate >/=800' with RW without needing a rest break to improve functional walking endurance and access to community. Baseline: 120' w/ RW no AFO/toe cap, 800 ft RW Goal status: MET   2.  Pt will decrease 5xSTS to </=35 seconds w/ BUE support in order to demonstrate decreased risk for falls and improved functional bilateral LE strength and power.  Baseline: 45.25 seconds w/ BUE, 36.78 sec with one UE (3/15) Goal status: IN PROGRESS   3.  Pt will demonstrate TUG of </=40 seconds in order to decrease risk of falls and improve functional mobility using LRAD. Baseline: 61 seconds w/ RW no AFO/toe cap, 29.75 sec with RW (3/15) Goal status: MET  4.  Pt will demonstrate a gait speed of >/=0.7 feet/sec using LRAD in order to decrease risk for falls. Baseline: 0.51 ft/sec, 1.46 ft/sec (3/15) Goal status: MET   NEW SHORT TERM GOALS:   Target date: 01/03/2023  Patient will be able to ambulate >/=1000' with RW without needing a rest break to improve functional walking endurance and access to community. Baseline: 120' w/ RW no AFO/toe cap, 800 ft RW Goal status: INITIAL  2.  Pt will decrease 5xSTS to </=35 seconds w/ BUE support in order to demonstrate decreased risk for falls and improved functional bilateral LE strength and power.  Baseline: 45.25 seconds w/ BUE, 36.78 sec with one UE (3/15) Goal status: INITIAL  3.  Pt will demonstrate a gait speed of >/=1.75 feet/sec using LRAD in order to decrease risk for falls. Baseline: 0.51 ft/sec, 1.46 ft/sec (3/15) Goal status: INITIAL   NEW LONG TERM GOALS:  Target date: 01/24/2023  Pt will be independent with final HEP for improved strength, balance, transfers and gait. Baseline:  Goal status: INITIAL  2.  Patient will be able to ambulate >/=1250' with RW without needing a rest  break to improve functional walking endurance and access to community. Baseline: 120' w/ RW no AFO/toe cap, 800 ft RW Goal  status: INITIAL  3.  Pt will decrease 5xSTS to </=30 seconds w/ BUE support in order to demonstrate decreased risk for falls and improved functional bilateral LE strength and power.  Baseline: 45.25 seconds w/ BUE, 36.78 sec with one UE (3/15) Goal status: INITIAL  4.  Pt will demonstrate a gait speed of >/=2.0 feet/sec using LRAD in order to decrease risk for falls. Baseline: 0.51 ft/sec, 1.46 ft/sec (3/15) Goal status: INITIAL    ASSESSMENT:  CLINICAL IMPRESSION: Emphasis of skilled PT session on reassessing LTG and creating new STG/LTG for recertification of PT services this date. Pt has met 3/4 LTG and is making progress towards 4/4 LTG. He demonstrates improved endurance, decreased fall risk, and improved balance from his initial evaluation. Pt does continue to benefit from skilled therapy services as his gait speed is below norms for his age and he exhibits decreased functional LE strength based on his 5xSTS score leading to ongoing increased fall risk and decreased safety and independence with functional mobility. Continue POC.   OBJECTIVE IMPAIRMENTS: Abnormal gait, decreased activity tolerance, decreased balance, decreased endurance, decreased mobility, difficulty walking, decreased ROM, decreased strength, improper body mechanics, and postural dysfunction.   ACTIVITY LIMITATIONS: carrying, lifting, bending, squatting, stairs, transfers, and locomotion level  PARTICIPATION LIMITATIONS: meal prep, cleaning, laundry, driving, community activity, and occupation  PERSONAL FACTORS: Age, Fitness, Past/current experiences, Time since onset of injury/illness/exacerbation, Transportation, and 1 comorbidity: ongoing question of transverse myelitis vs other spinal cord pathology  are also affecting patient's functional outcome.   REHAB POTENTIAL: Good  CLINICAL DECISION MAKING: Evolving/moderate complexity  EVALUATION COMPLEXITY: Moderate  PLAN:  PT FREQUENCY: 1x/week  PT DURATION: 8  weeks + 6 weeks  PLANNED INTERVENTIONS: Therapeutic exercises, Therapeutic activity, Neuromuscular re-education, Balance training, Gait training, Patient/Family education, Self Care, Stair training, Vestibular training, Orthotic/Fit training, DME instructions, Manual therapy, and Re-evaluation  PLAN FOR NEXT SESSION: personal cane gait training outdoors w/ new toe cap if installed. appt with [name] 3/15 did they add toe cap at appt?, R strengthening/NMR, static unsupported balance, endurance-SciFit, LBQC on left, did he purchase reversible cane?    Excell Seltzer, PT, DPT, CSRS 12/13/2022, 9:58 AM

## 2022-12-17 ENCOUNTER — Ambulatory Visit: Payer: Medicare Other | Admitting: Physical Therapy

## 2022-12-17 ENCOUNTER — Encounter: Payer: Self-pay | Admitting: Physical Therapy

## 2022-12-17 DIAGNOSIS — R262 Difficulty in walking, not elsewhere classified: Secondary | ICD-10-CM

## 2022-12-17 DIAGNOSIS — R293 Abnormal posture: Secondary | ICD-10-CM

## 2022-12-17 DIAGNOSIS — R2681 Unsteadiness on feet: Secondary | ICD-10-CM

## 2022-12-17 DIAGNOSIS — R2689 Other abnormalities of gait and mobility: Secondary | ICD-10-CM

## 2022-12-17 DIAGNOSIS — M6281 Muscle weakness (generalized): Secondary | ICD-10-CM

## 2022-12-17 NOTE — Therapy (Signed)
OUTPATIENT PHYSICAL THERAPY NEURO TREATMENT-10th Visit Progress Note   Patient Name: Maurice Cruz MRN: [id number] DOB:July 24, 1966, 57 y.o., male Today's Date: 12/17/2022   PCP: Maurice Cruz [facility]) REFERRING PROVIDER: Barbie Banner, PA-C   PT progress note for Maurice Cruz.  Reporting period 10/08/2022 to 12/10/2022  See Note below for Objective Data and Assessment of Progress/Goals  Thank you for the referral of this patient. Maurice Cruz, PT, DPT   END OF SESSION:    PT End of Session - 12/17/22 1318     Visit Number 10    Number of Visits 15   recert   Date for PT Re-Evaluation 02/06/23   pushed out due to scheduling delays   Authorization Type MEDICARE PART A AND B    Progress Note Due on Visit 10    PT Start Time 1314    PT Stop Time 1400    PT Time Calculation (min) 46 min    Equipment Utilized During Treatment Gait belt    Activity Tolerance Patient tolerated treatment well    Behavior During Therapy WFL for tasks assessed/performed               Past Medical History:  Diagnosis Date   Anxiety    Asthma    as a child   Depression    ED (erectile dysfunction)    GERD (gastroesophageal reflux disease)    Hx of spinal cord injury 03/2020   Hypertension    Neurogenic bladder    self caths   Neurogenic bowel    has to be digitially stimulated - 04/24/21   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[place])    Past Surgical History:  Procedure Laterality Date   Anorectal biospy  02/09/2021   Colon Biospy  02/09/2021   IR ANGIO INTRA EXTRACRAN SEL COM CAROTID INNOMINATE BILAT MOD SED  04/25/2021   IR ANGIO VERTEBRAL SEL SUBCLAVIAN INNOMINATE UNI L MOD SED  04/25/2021   IR ANGIO VERTEBRAL SEL VERTEBRAL UNI R MOD SED  04/25/2021   IR ANGIO/SPINAL LEFT  04/25/2021   IR ANGIO/SPINAL LEFT  04/25/2021   IR ANGIO/SPINAL LEFT  04/25/2021   IR ANGIO/SPINAL LEFT  04/25/2021   IR ANGIO/SPINAL LEFT  04/25/2021   IR ANGIO/SPINAL  LEFT  04/25/2021   IR ANGIO/SPINAL LEFT  04/25/2021   IR ANGIO/SPINAL LEFT  04/25/2021   IR ANGIO/SPINAL LEFT  04/25/2021   IR ANGIO/SPINAL LEFT  04/25/2021   IR ANGIO/SPINAL LEFT  04/25/2021   IR ANGIO/SPINAL LEFT  04/25/2021   IR ANGIO/SPINAL LEFT  04/25/2021   IR ANGIO/SPINAL RIGHT  04/25/2021   IR ANGIO/SPINAL RIGHT  04/25/2021   IR ANGIO/SPINAL RIGHT  04/25/2021   IR ANGIO/SPINAL RIGHT  04/25/2021   IR ANGIO/SPINAL RIGHT  04/25/2021   IR ANGIO/SPINAL RIGHT  04/25/2021   IR ANGIO/SPINAL RIGHT  04/25/2021   IR ANGIO/SPINAL RIGHT  04/25/2021   IR ANGIO/SPINAL RIGHT  04/25/2021   IR ANGIO/SPINAL RIGHT  04/25/2021   IR ANGIO/SPINAL RIGHT  04/25/2021   IR ANGIO/SPINAL RIGHT  04/25/2021   IR ANGIO/SPINAL RIGHT  04/25/2021   IR ANGIO/SPINAL RIGHT  04/25/2021   IR ANGIOGRAM EXTREMITY BILATERAL  04/25/2021   IR RADIOLOGIST EVAL & MGMT  03/21/2021   RADIOLOGY WITH ANESTHESIA N/A 04/25/2021   Procedure: IR WITH ANESTHESIA SPINAL ANGIOGRAM;  Surgeon: Maurice Bras, MD;  Location: [facility];  Service: Radiology;  Laterality: N/A;   Patient Active Problem List  Diagnosis Date Noted   Acute gout of right ankle 09/10/2022   Anemia of chronic disease 09/09/2022   Adjustment disorder with depressed mood 09/04/2022   Debility 08/30/2022   Spinal cord disorder ([place]) 08/29/2022   AKI (acute kidney injury) ([place]) 08/26/2022   Normocytic anemia 08/26/2022   Left leg pain 08/26/2022   Pleural effusion 08/26/2022   Anxiety 08/26/2022   GERD (gastroesophageal reflux disease) 08/26/2022   Essential hypertension 08/26/2022   Hyperlipidemia 08/26/2022   Leg weakness 08/25/2022   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[place]) 05/21/2021   Anti-RNP antibodies present 05/21/2021   Weakness of both lower extremities 02/05/2021   Carcinoma in situ 01/03/2021   Neurogenic bladder 12/08/2020   Neurogenic bowel 12/08/2020   Paraplegia following spinal cord injury ([place]) 12/08/2020   Wheelchair dependence 12/08/2020   Spasticity 12/08/2020    Chronic bilateral thoracic back pain 12/08/2020    ONSET DATE: 09/09/2022 (referral date)  REFERRING DIAG: R53.81 (ICD-10-CM) - Other malaise  THERAPY DIAG:  Muscle weakness (generalized)  Abnormal posture  Unsteadiness on feet  Difficulty in walking, not elsewhere classified  Other abnormalities of gait and mobility  Rationale for Evaluation and Treatment: Rehabilitation  SUBJECTIVE:                                                                                                                                                                                             SUBJECTIVE STATEMENT: Pt shows therapist right toe cap added to [employer] at [employer].  He ambulates into clinic w/ RW requesting new tennis balls due to friction noted during gait.  Pt states he has same low back discomfort today.  He returned previous cane and obtained bariatric reversible LBQC and brings to session today.  He has been sticking to walking program given at session prior using RW.  Pt accompanied by: self, friend  PERTINENT HISTORY: Transverse myelitis, neurogenic bowel and bladder  From hospital notes:  presented to [facility] ED on 08/25/2022 complaining of several history of BLE weakness, left greater than right. NS and neurology consulted. MRI with chronic changes. Neurology confirmed no TM diagnosis and MRI of complete spine negative for cord compression and stable cord hemorrhage as compared to July 2022. Complained of right ankle pain on 11/27 and started on colchicine for acute gout flare. Also complained of lingering cough and had mild fever to 100.6. Chest x-ray and blood cultures performed. Possible LLL pneumonia.   Admitted to inpatient rehab from 08/29/2022 - 09/10/2022.  Pt was ambulating at CGA level w/ toe cap.  Transferring supervision level. PAIN:  Endorses same pain from prior. Are you having pain? Yes: NPRS  scale: 4/10 Pain location: low back Pain description: achy, cramping Aggravating  factors: activity Relieving factors: resting  PRECAUTIONS: Fall  WEIGHT BEARING RESTRICTIONS: No  FALLS: Has patient fallen in last 6 months? No  LIVING ENVIRONMENT: Lives with:  mother and father Lives in: House/apartment Stairs: Yes: Internal: 12 steps; on right going up and on left going up and External: 1 steps; none, pt has chair lift at home for stairs. Has following equipment at home: Gilford Rile - 2 wheeled, Wheelchair (manual), and bed side commode  PLOF: Independent with dressing, self care, transfer, parents help with cooking, cleaning.   PATIENT GOALS: "To get back to where we were when I discharged."  OBJECTIVE:  Measures assessed at initial evaluation unless otherwise noted:  LOWER EXTREMITY MMT:    MMT Right Eval Left Eval  Hip flexion 1/5 3/5  Hip extension    Hip abduction 1+/5 3+/5  Hip adduction 2+/5 3+/5  Hip internal rotation    Hip external rotation    Knee flexion    Knee extension    Ankle dorsiflexion    Ankle plantarflexion    Ankle inversion    Ankle eversion    (Blank rows = not tested)   TODAY'S TREATMENT:                                                                                                                              Gait: Gait pattern: decreased hip/knee flexion- Left and decreased ankle dorsiflexion- Left Distance walked: 240 ft + 400 ft Assistive device utilized: Walker - 2 wheeled Level of assistance: Modified independence Comments: RPE 6/10 following.  Pt ambulates ~150' indoors over level surface prior to ambulation outdoors over unlevel sidewalk x400' and ~90' upon re-entry of building.  No signs of imbalance w/ use of cane and no difficulty sequencing and advancing cane despite added weight of bariatric device.  He also maintains consistent speed w/ cane throughout.  Min cuing for improved fluidity of turns as turns w/ cane remain wider than w/ RW.  Encouraged patient to walk w/ cane at home inside home and over paved  surfaces outside w/ close supervision.  -Countertop lateral lunges w/ BUE support, pt uses slight oscillatory movement to initiate hip abduction due to lack of right knee flexion when stepping right > attempted to progress to ipsilateral reach w/ lunge w/ pt unable to return from right independently so modified to RUE slide against counter w/ increased challenge, but improved performance compared to reach    PATIENT EDUCATION: Education details: Continue HEP and walking program.  Discussed joint crepitus in right knee and possible explanations like OA vs less likely structural injuries as pt is currently not having excessive locking/pain w/ movement.  Provided general walking guidelines to treat OA if this is the case as a goal for patient to work on progressing walking tolerance towards.  Discussed care of toe cap by not drying shoes in dryer and hand washing until  speaking to [name] for specifics.  Discussed modification to walking program w/ patient incorporating use of cane vs walker at a 1:5 ratio outside until builds tolerance w/ supervision to start.  Use cane in home to build confidence prior to walking outdoors. Person educated: Patient Education method: Explanation Education comprehension: verbalized understanding  HOME EXERCISE PROGRAM: Access Code: [email or web address] URL: [email or web address] Date: 10/21/2022 Prepared by: Excell Seltzer  Exercises - Supine Heel Slide with Strap  - 1 x daily - 7 x weekly - 3 sets - 10 reps - Clamshell  - 1 x daily - 7 x weekly - 3 sets - 10 reps - Sit to Stand Without Arm Support  - 1 x daily - 7 x weekly - 3 sets - 10 reps - Side Stepping with Counter Support  - 1 x daily - 7 x weekly - 3 sets - 10 reps - Standing March with Counter Support  - 1 x daily - 7 x weekly - 3 sets - 10 reps - Standing Hip Abduction with Counter Support  - 1 x daily - 7 x weekly - 3 sets - 10 reps - Single Leg Bridge  - 1 x daily - 7 x weekly - 3 sets - 10 reps -  Supine Bridge with Mini Swiss Ball Between Knees  - 1 x daily - 7 x weekly - 3 sets - 10 reps - Standing Terminal Knee Extension with Resistance  - 1 x daily - 7 x weekly - 3 sets - 10 reps   Walking Program: Walk with your family. Walk during cooler times of the day. Starting this week, walk 5 min per day for 7 days. Following week add 5 minutes to your total time. Week 1: 5 min Week 2: 10 min Week 3: 15 min Week 4: 20 min...Marland KitchenMarland KitchenUntil you can walk to 30 min per day    GOALS: Goals reviewed with patient? Yes   SHORT TERM GOALS: Target date: 11/08/2022   Patient will be able to ambulate 500' with RW without needing a rest break to improve functional walking endurance. Baseline: 120', 345 ft with RW mod I (2/5) Goal status: IN PROGRESS   2.  Pt will decrease 5xSTS to </=40 seconds w/ BUE support in order to demonstrate decreased risk for falls and improved functional bilateral LE strength and power.  Baseline: 45.25 seconds w/ BUE, 35.97 sec with BUE (2/5) Goal status: MET   3.  Pt will demonstrate TUG of </=50 seconds in order to decrease risk of falls and improve functional mobility using LRAD. Baseline: 61 seconds w/ RW no AFO/toe cap, 36.5 sec with RW with no AFO/toe cap (2/5) Goal status: MET  4.  Pt will demonstrate a gait speed of >/=0.6 feet/sec in order to decrease risk for falls. Baseline: 0.51 ft/sec, 1.26 ft/sec with RW mod I (2/5) Goal status: MET   LONG TERM GOALS: Target date: 12/06/2022   Patient will be able to ambulate >/=800' with RW without needing a rest break to improve functional walking endurance and access to community. Baseline: 120' w/ RW no AFO/toe cap, 800 ft RW Goal status: MET   2.  Pt will decrease 5xSTS to </=35 seconds w/ BUE support in order to demonstrate decreased risk for falls and improved functional bilateral LE strength and power.  Baseline: 45.25 seconds w/ BUE, 36.78 sec with one UE (3/15) Goal status: IN PROGRESS   3.  Pt will  demonstrate TUG of </=40 seconds in order to decrease risk of  falls and improve functional mobility using LRAD. Baseline: 61 seconds w/ RW no AFO/toe cap, 29.75 sec with RW (3/15) Goal status: MET  4.  Pt will demonstrate a gait speed of >/=0.7 feet/sec using LRAD in order to decrease risk for falls. Baseline: 0.51 ft/sec, 1.46 ft/sec (3/15) Goal status: MET   NEW SHORT TERM GOALS:   Target date: 01/03/2023  Patient will be able to ambulate >/=1000' with RW without needing a rest break to improve functional walking endurance and access to community. Baseline: 120' w/ RW no AFO/toe cap, 800 ft RW Goal status: INITIAL  2.  Pt will decrease 5xSTS to </=35 seconds w/ BUE support in order to demonstrate decreased risk for falls and improved functional bilateral LE strength and power.  Baseline: 45.25 seconds w/ BUE, 36.78 sec with one UE (3/15) Goal status: INITIAL  3.  Pt will demonstrate a gait speed of >/=1.75 feet/sec using LRAD in order to decrease risk for falls. Baseline: 0.51 ft/sec, 1.46 ft/sec (3/15) Goal status: INITIAL   NEW LONG TERM GOALS:  Target date: 01/24/2023  Pt will be independent with final HEP for improved strength, balance, transfers and gait. Baseline:  Goal status: INITIAL  2.  Patient will be able to ambulate >/=1250' with RW without needing a rest break to improve functional walking endurance and access to community. Baseline: 120' w/ RW no AFO/toe cap, 800 ft RW Goal status: INITIAL  3.  Pt will decrease 5xSTS to </=30 seconds w/ BUE support in order to demonstrate decreased risk for falls and improved functional bilateral LE strength and power.  Baseline: 45.25 seconds w/ BUE, 36.78 sec with one UE (3/15) Goal status: INITIAL  4.  Pt will demonstrate a gait speed of >/=2.0 feet/sec using LRAD in order to decrease risk for falls. Baseline: 0.51 ft/sec, 1.46 ft/sec (3/15) Goal status: INITIAL    ASSESSMENT:  CLINICAL IMPRESSION: Focus of skilled  session today on ambulatory tolerance and quality using newly purchased bariatric LBQC.  Pt does well outside over unlevel surfaces w/ newly installed toe cap and demonstrates improving tolerance to LRAD.  Progressed walking program to incorporate new device.  Further addressed right LE NMR by placing quad in patient's most poorly controlled position attempting to increase reliance on muscular control to return to upright.  He requires ongoing UE reliance for safe independence with this task.  Will further address right NMR, functional strength, and safety and tolerance to ambulation w/ LBQC in visits to come.   OBJECTIVE IMPAIRMENTS: Abnormal gait, decreased activity tolerance, decreased balance, decreased endurance, decreased mobility, difficulty walking, decreased ROM, decreased strength, improper body mechanics, and postural dysfunction.   ACTIVITY LIMITATIONS: carrying, lifting, bending, squatting, stairs, transfers, and locomotion level  PARTICIPATION LIMITATIONS: meal prep, cleaning, laundry, driving, community activity, and occupation  PERSONAL FACTORS: Age, Fitness, Past/current experiences, Time since onset of injury/illness/exacerbation, Transportation, and 1 comorbidity: ongoing question of transverse myelitis vs other spinal cord pathology  are also affecting patient's functional outcome.   REHAB POTENTIAL: Good  CLINICAL DECISION MAKING: Evolving/moderate complexity  EVALUATION COMPLEXITY: Moderate  PLAN:  PT FREQUENCY: 1x/week  PT DURATION: 8 weeks + 6 weeks  PLANNED INTERVENTIONS: Therapeutic exercises, Therapeutic activity, Neuromuscular re-education, Balance training, Gait training, Patient/Family education, Self Care, Stair training, Vestibular training, Orthotic/Fit training, DME instructions, Manual therapy, and Re-evaluation  PLAN FOR NEXT SESSION:  R strengthening/NMR, static unsupported balance-tilt board > gumdrop taps, airex ball toss, endurance-SciFit, LBQC on  left-continue to progress outdoor tolerance w/ toe cap  Bary Richard, PT, DPT 12/17/2022, 2:15 PM

## 2022-12-25 ENCOUNTER — Ambulatory Visit: Payer: Medicare Other | Admitting: Physical Therapy

## 2022-12-25 ENCOUNTER — Encounter: Payer: Self-pay | Admitting: Physical Therapy

## 2022-12-25 DIAGNOSIS — M6281 Muscle weakness (generalized): Secondary | ICD-10-CM | POA: Diagnosis not present

## 2022-12-25 DIAGNOSIS — R2681 Unsteadiness on feet: Secondary | ICD-10-CM

## 2022-12-25 DIAGNOSIS — R2689 Other abnormalities of gait and mobility: Secondary | ICD-10-CM

## 2022-12-25 DIAGNOSIS — R262 Difficulty in walking, not elsewhere classified: Secondary | ICD-10-CM

## 2022-12-25 DIAGNOSIS — R293 Abnormal posture: Secondary | ICD-10-CM

## 2022-12-25 NOTE — Therapy (Unsigned)
OUTPATIENT PHYSICAL THERAPY NEURO TREATMENT   Patient Name: Maurice Cruz MRN: [id number] DOB:18-Mar-1966, 57 y.o., male Today's Date: 12/26/2022   PCP: Clayborne Dana [facility]) REFERRING PROVIDER: Barbie Banner, PA-C   END OF SESSION:    PT End of Session - 12/25/22 1324     Visit Number 11    Number of Visits 15   recert   Date for PT Re-Evaluation 02/06/23   pushed out due to scheduling delays   Authorization Type MEDICARE PART A AND B    Progress Note Due on Visit 10    PT Start Time 1319   PT assisting another therapist prior.   PT Stop Time 1359    PT Time Calculation (min) 40 min    Equipment Utilized During Treatment Gait belt    Activity Tolerance Patient tolerated treatment well    Behavior During Therapy WFL for tasks assessed/performed               Past Medical History:  Diagnosis Date   Anxiety    Asthma    as a child   Depression    ED (erectile dysfunction)    GERD (gastroesophageal reflux disease)    Hx of spinal cord injury 03/2020   Hypertension    Neurogenic bladder    self caths   Neurogenic bowel    has to be digitially stimulated - 04/24/21   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[place])    bilateral legs   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[place])    Past Surgical History:  Procedure Laterality Date   Anorectal biospy  02/09/2021   Colon Biospy  02/09/2021   IR ANGIO INTRA EXTRACRAN SEL COM CAROTID INNOMINATE BILAT MOD SED  04/25/2021   IR ANGIO VERTEBRAL SEL SUBCLAVIAN INNOMINATE UNI L MOD SED  04/25/2021   IR ANGIO VERTEBRAL SEL VERTEBRAL UNI R MOD SED  04/25/2021   IR ANGIO/SPINAL LEFT  04/25/2021   IR ANGIO/SPINAL LEFT  04/25/2021   IR ANGIO/SPINAL LEFT  04/25/2021   IR ANGIO/SPINAL LEFT  04/25/2021   IR ANGIO/SPINAL LEFT  04/25/2021   IR ANGIO/SPINAL LEFT  04/25/2021   IR ANGIO/SPINAL LEFT  04/25/2021   IR ANGIO/SPINAL LEFT  04/25/2021   IR ANGIO/SPINAL LEFT  04/25/2021   IR ANGIO/SPINAL LEFT  04/25/2021   IR ANGIO/SPINAL LEFT  04/25/2021   IR ANGIO/SPINAL LEFT   04/25/2021   IR ANGIO/SPINAL LEFT  04/25/2021   IR ANGIO/SPINAL RIGHT  04/25/2021   IR ANGIO/SPINAL RIGHT  04/25/2021   IR ANGIO/SPINAL RIGHT  04/25/2021   IR ANGIO/SPINAL RIGHT  04/25/2021   IR ANGIO/SPINAL RIGHT  04/25/2021   IR ANGIO/SPINAL RIGHT  04/25/2021   IR ANGIO/SPINAL RIGHT  04/25/2021   IR ANGIO/SPINAL RIGHT  04/25/2021   IR ANGIO/SPINAL RIGHT  04/25/2021   IR ANGIO/SPINAL RIGHT  04/25/2021   IR ANGIO/SPINAL RIGHT  04/25/2021   IR ANGIO/SPINAL RIGHT  04/25/2021   IR ANGIO/SPINAL RIGHT  04/25/2021   IR ANGIO/SPINAL RIGHT  04/25/2021   IR ANGIOGRAM EXTREMITY BILATERAL  04/25/2021   IR RADIOLOGIST EVAL & MGMT  03/21/2021   RADIOLOGY WITH ANESTHESIA N/A 04/25/2021   Procedure: IR WITH ANESTHESIA SPINAL ANGIOGRAM;  Surgeon: Luanne Bras, MD;  Location: [facility] OR;  Service: Radiology;  Laterality: N/A;   Patient Active Problem List   Diagnosis Date Noted   Acute gout of right ankle 09/10/2022   Anemia of chronic disease 09/09/2022   Adjustment disorder with depressed mood 09/04/2022   Debility 08/30/2022   Spinal cord disorder (  [place]) 08/29/2022   AKI (acute kidney injury) ([place]) 08/26/2022   Normocytic anemia 08/26/2022   Left leg pain 08/26/2022   Pleural effusion 08/26/2022   Anxiety 08/26/2022   GERD (gastroesophageal reflux disease) 08/26/2022   Essential hypertension 08/26/2022   Hyperlipidemia 08/26/2022   Leg weakness 08/25/2022   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[place]) 05/21/2021   Anti-RNP antibodies present 05/21/2021   Weakness of both lower extremities 02/05/2021   Carcinoma in situ 01/03/2021   Neurogenic bladder 12/08/2020   Neurogenic bowel 12/08/2020   Paraplegia following spinal cord injury ([place]) 12/08/2020   Wheelchair dependence 12/08/2020   Spasticity 12/08/2020   Chronic bilateral thoracic back pain 12/08/2020    ONSET DATE: 09/09/2022 (referral date)  REFERRING DIAG: R53.81 (ICD-10-CM) - Other malaise  THERAPY DIAG:  Muscle weakness (generalized)  Abnormal  posture  Unsteadiness on feet  Difficulty in walking, not elsewhere classified  Other abnormalities of gait and mobility  Rationale for Evaluation and Treatment: Rehabilitation  SUBJECTIVE:                                                                                                                                                                                             SUBJECTIVE STATEMENT: He states he is walking with the RW today with shoes without toe cap due to rain.  He denies recent falls.  Pt accompanied by: self, friend  PERTINENT HISTORY: Transverse myelitis, neurogenic bowel and bladder  From hospital notes:  presented to [facility] ED on 08/25/2022 complaining of several history of BLE weakness, left greater than right. NS and neurology consulted. MRI with chronic changes. Neurology confirmed no TM diagnosis and MRI of complete spine negative for cord compression and stable cord hemorrhage as compared to July 2022. Complained of right ankle pain on 11/27 and started on colchicine for acute gout flare. Also complained of lingering cough and had mild fever to 100.6. Chest x-ray and blood cultures performed. Possible LLL pneumonia.   Admitted to inpatient rehab from 08/29/2022 - 09/10/2022.  Pt was ambulating at CGA level w/ toe cap.  Transferring supervision level. PAIN:  Endorses same pain from prior. Are you having pain? Yes: NPRS scale: 4/10 Pain location: low back Pain description: achy Aggravating factors: activity Relieving factors: resting  PRECAUTIONS: Fall  WEIGHT BEARING RESTRICTIONS: No  FALLS: Has patient fallen in last 6 months? No  LIVING ENVIRONMENT: Lives with:  mother and father Lives in: House/apartment Stairs: Yes: Internal: 12 steps; on right going up and on left going up and External: 1 steps; none, pt has chair lift at home for stairs. Has following equipment at home: Gilford Rile - 2  wheeled, Wheelchair (manual), and bed side commode  PLOF:  Independent with dressing, self care, transfer, parents help with cooking, cleaning.   PATIENT GOALS: "To get back to where we were when I discharged."  OBJECTIVE:  Measures assessed at initial evaluation unless otherwise noted:  LOWER EXTREMITY MMT:    MMT Right Eval Left Eval  Hip flexion 1/5 3/5  Hip extension    Hip abduction 1+/5 3+/5  Hip adduction 2+/5 3+/5  Hip internal rotation    Hip external rotation    Knee flexion    Knee extension    Ankle dorsiflexion    Ankle plantarflexion    Ankle inversion    Ankle eversion    (Blank rows = not tested)   TODAY'S TREATMENT:                                                                                                                              -Airex standing ball toss > 2kg weighted ball toss on airex w/ normal BOS -Anteriorly oriented tilt board squats x12 > gumdrop taps progressing to no UE support w/ min-modA posterior support, standing on RLE tapping w/ LLE -advance retreat over 1"-4" hurdles varying reps performed using mirror feedback to promote motor control and dynamic SLS (only able to progress to LUE support only w/ 4" hurdles vs none w/ 1 and 2" hurdles) -modified deadlifts to 14" step w/o weight working on balance and form x10 > 5lb weight for increased anterior weight shifting and recovery challenge x10, good glut engagement, pt requires increased time to return to upright and increased cues for LE form  PATIENT EDUCATION: Education details: Continue HEP and walking program.   Person educated: Patient Education method: Explanation Education comprehension: verbalized understanding  HOME EXERCISE PROGRAM: Access Code: [email or web address] URL: [email or web address] Date: 10/21/2022 Prepared by: Excell Seltzer  Exercises - Supine Heel Slide with Strap  - 1 x daily - 7 x weekly - 3 sets - 10 reps - Clamshell  - 1 x daily - 7 x weekly - 3 sets - 10 reps - Sit to Stand Without Arm Support  - 1 x daily - 7  x weekly - 3 sets - 10 reps - Side Stepping with Counter Support  - 1 x daily - 7 x weekly - 3 sets - 10 reps - Standing March with Counter Support  - 1 x daily - 7 x weekly - 3 sets - 10 reps - Standing Hip Abduction with Counter Support  - 1 x daily - 7 x weekly - 3 sets - 10 reps - Single Leg Bridge  - 1 x daily - 7 x weekly - 3 sets - 10 reps - Supine Bridge with Mini Swiss Ball Between Knees  - 1 x daily - 7 x weekly - 3 sets - 10 reps - Standing Terminal Knee Extension with Resistance  - 1 x daily - 7 x weekly - 3 sets - 10 reps  Walking Program: Walk with your family. Walk during cooler times of the day. Starting this week, walk 5 min per day for 7 days. Following week add 5 minutes to your total time. Week 1: 5 min Week 2: 10 min Week 3: 15 min Week 4: 20 min...Marland KitchenMarland KitchenUntil you can walk to 30 min per day    GOALS: Goals reviewed with patient? Yes  NEW SHORT TERM GOALS:   Target date: 01/03/2023  Patient will be able to ambulate >/=1000' with RW without needing a rest break to improve functional walking endurance and access to community. Baseline: 120' w/ RW no AFO/toe cap, 800 ft RW Goal status: INITIAL  2.  Pt will decrease 5xSTS to </=35 seconds w/ BUE support in order to demonstrate decreased risk for falls and improved functional bilateral LE strength and power.  Baseline: 45.25 seconds w/ BUE, 36.78 sec with one UE (3/15) Goal status: INITIAL  3.  Pt will demonstrate a gait speed of >/=1.75 feet/sec using LRAD in order to decrease risk for falls. Baseline: 0.51 ft/sec, 1.46 ft/sec (3/15) Goal status: INITIAL   NEW LONG TERM GOALS:  Target date: 01/24/2023  Pt will be independent with final HEP for improved strength, balance, transfers and gait. Baseline:  Goal status: INITIAL  2.  Patient will be able to ambulate >/=1250' with RW without needing a rest break to improve functional walking endurance and access to community. Baseline: 120' w/ RW no AFO/toe cap, 800 ft  RW Goal status: INITIAL  3.  Pt will decrease 5xSTS to </=30 seconds w/ BUE support in order to demonstrate decreased risk for falls and improved functional bilateral LE strength and power.  Baseline: 45.25 seconds w/ BUE, 36.78 sec with one UE (3/15) Goal status: INITIAL  4.  Pt will demonstrate a gait speed of >/=2.0 feet/sec using LRAD in order to decrease risk for falls. Baseline: 0.51 ft/sec, 1.46 ft/sec (3/15) Goal status: INITIAL    ASSESSMENT:  CLINICAL IMPRESSION: Emphasis of skilled PT session today on continuing to address dynamic SLS and unsupported motor planning with RLE in stance.  He has shown marked improvement in trunk control and limb advancement since prior episode of care in this clinic.  Notable today was progression to step-over 2" hurdles unsupported w/ minimal LOB.  His RLE remains weak with poor motor control and patient verbalizes realistic expectations of functional return further stating motivation to return to highest possible baseline.  As he is making good progress towards personal and objective goals at this time, pt to continue per written POC and assess STGs to further tailor plan at next session.  OBJECTIVE IMPAIRMENTS: Abnormal gait, decreased activity tolerance, decreased balance, decreased endurance, decreased mobility, difficulty walking, decreased ROM, decreased strength, improper body mechanics, and postural dysfunction.   ACTIVITY LIMITATIONS: carrying, lifting, bending, squatting, stairs, transfers, and locomotion level  PARTICIPATION LIMITATIONS: meal prep, cleaning, laundry, driving, community activity, and occupation  PERSONAL FACTORS: Age, Fitness, Past/current experiences, Time since onset of injury/illness/exacerbation, Transportation, and 1 comorbidity: ongoing question of transverse myelitis vs other spinal cord pathology  are also affecting patient's functional outcome.   REHAB POTENTIAL: Good  CLINICAL DECISION MAKING: Evolving/moderate  complexity  EVALUATION COMPLEXITY: Moderate  PLAN:  PT FREQUENCY: 1x/week  PT DURATION: 8 weeks + 6 weeks  PLANNED INTERVENTIONS: Therapeutic exercises, Therapeutic activity, Neuromuscular re-education, Balance training, Gait training, Patient/Family education, Self Care, Stair training, Vestibular training, Orthotic/Fit training, DME instructions, Manual therapy, and Re-evaluation  PLAN FOR NEXT SESSION:  REVIEW AND MODIFY  HEP-R strengthening/NMR, static unsupported balance-tilt board, balance beam, endurance-SciFit, LBQC on left-continue to progress outdoor tolerance w/ toe cap, spanish squats, ASSESS STGs!   Bary Richard, PT, DPT 12/26/2022, 5:21 PM

## 2023-01-01 ENCOUNTER — Encounter: Payer: Self-pay | Admitting: Physical Therapy

## 2023-01-01 ENCOUNTER — Ambulatory Visit: Payer: Medicare Other | Attending: Physician Assistant | Admitting: Physical Therapy

## 2023-01-01 DIAGNOSIS — R293 Abnormal posture: Secondary | ICD-10-CM | POA: Insufficient documentation

## 2023-01-01 DIAGNOSIS — G373 Acute transverse myelitis in demyelinating disease of central nervous system: Secondary | ICD-10-CM | POA: Insufficient documentation

## 2023-01-01 DIAGNOSIS — R2681 Unsteadiness on feet: Secondary | ICD-10-CM | POA: Diagnosis present

## 2023-01-01 DIAGNOSIS — M6281 Muscle weakness (generalized): Secondary | ICD-10-CM | POA: Insufficient documentation

## 2023-01-01 DIAGNOSIS — R2689 Other abnormalities of gait and mobility: Secondary | ICD-10-CM | POA: Insufficient documentation

## 2023-01-01 DIAGNOSIS — R262 Difficulty in walking, not elsewhere classified: Secondary | ICD-10-CM | POA: Insufficient documentation

## 2023-01-01 NOTE — Therapy (Signed)
OUTPATIENT PHYSICAL THERAPY NEURO TREATMENT   Patient Name: Maurice Cruz MRN: [id number] DOB:[birth year], 57 y.o., male Today's Date: 01/01/2023   PCP: [facility]) REFERRING PROVIDER: Barbie Banner, PA-C   END OF SESSION:    PT End of Session - 01/01/23 1321     Visit Number 12    Number of Visits 15   recert   Date for PT Re-Evaluation 02/06/23   pushed out due to scheduling delays   Authorization Type MEDICARE PART A AND B    Progress Note Due on Visit 10    PT Start Time 1318    PT Stop Time 1403    PT Time Calculation (min) 45 min    Equipment Utilized During Treatment Gait belt    Activity Tolerance Patient tolerated treatment well    Behavior During Therapy WFL for tasks assessed/performed               Past Medical History:  Diagnosis Date   Anxiety    Asthma    as a child   Depression    ED (erectile dysfunction)    GERD (gastroesophageal reflux disease)    Hx of spinal cord injury 03/2020   Hypertension    Neurogenic bladder    self caths   Neurogenic bowel    has to be digitially stimulated - 04/24/21   Paraparesis    bilateral legs   Transverse myelitis    Past Surgical History:  Procedure Laterality Date   Anorectal biospy  02/09/2021   Colon Biospy  02/09/2021   IR ANGIO INTRA EXTRACRAN SEL COM CAROTID INNOMINATE BILAT MOD SED  04/25/2021   IR ANGIO VERTEBRAL SEL SUBCLAVIAN INNOMINATE UNI L MOD SED  04/25/2021   IR ANGIO VERTEBRAL SEL VERTEBRAL UNI R MOD SED  04/25/2021   IR ANGIO/SPINAL LEFT  04/25/2021   IR ANGIO/SPINAL LEFT  04/25/2021   IR ANGIO/SPINAL LEFT  04/25/2021   IR ANGIO/SPINAL LEFT  04/25/2021   IR ANGIO/SPINAL LEFT  04/25/2021   IR ANGIO/SPINAL LEFT  04/25/2021   IR ANGIO/SPINAL LEFT  04/25/2021   IR ANGIO/SPINAL LEFT  04/25/2021   IR ANGIO/SPINAL LEFT  04/25/2021   IR ANGIO/SPINAL LEFT  04/25/2021   IR ANGIO/SPINAL LEFT  04/25/2021   IR ANGIO/SPINAL LEFT  04/25/2021   IR ANGIO/SPINAL LEFT  04/25/2021   IR  ANGIO/SPINAL RIGHT  04/25/2021   IR ANGIO/SPINAL RIGHT  04/25/2021   IR ANGIO/SPINAL RIGHT  04/25/2021   IR ANGIO/SPINAL RIGHT  04/25/2021   IR ANGIO/SPINAL RIGHT  04/25/2021   IR ANGIO/SPINAL RIGHT  04/25/2021   IR ANGIO/SPINAL RIGHT  04/25/2021   IR ANGIO/SPINAL RIGHT  04/25/2021   IR ANGIO/SPINAL RIGHT  04/25/2021   IR ANGIO/SPINAL RIGHT  04/25/2021   IR ANGIO/SPINAL RIGHT  04/25/2021   IR ANGIO/SPINAL RIGHT  04/25/2021   IR ANGIO/SPINAL RIGHT  04/25/2021   IR ANGIO/SPINAL RIGHT  04/25/2021   IR ANGIOGRAM EXTREMITY BILATERAL  04/25/2021   IR RADIOLOGIST EVAL & MGMT  03/21/2021   RADIOLOGY WITH ANESTHESIA N/A 04/25/2021   Procedure: IR WITH ANESTHESIA SPINAL ANGIOGRAM;  Surgeon: Luanne Bras, MD;  Location: [facility] OR;  Service: Radiology;  Laterality: N/A;   Patient Active Problem List   Diagnosis Date Noted   Acute gout of right ankle 09/10/2022   Anemia of chronic disease 09/09/2022   Adjustment disorder with depressed mood 09/04/2022   Debility 08/30/2022   Spinal cord disorder 08/29/2022   AKI (acute kidney injury) 08/26/2022  Normocytic anemia 08/26/2022   Left leg pain 08/26/2022   Pleural effusion 08/26/2022   Anxiety 08/26/2022   GERD (gastroesophageal reflux disease) 08/26/2022   Essential hypertension 08/26/2022   Hyperlipidemia 08/26/2022   Leg weakness 08/25/2022   Spinal cord hemorrhage 05/21/2021   Anti-RNP antibodies present 05/21/2021   Weakness of both lower extremities 02/05/2021   Carcinoma in situ 01/03/2021   Neurogenic bladder 12/08/2020   Neurogenic bowel 12/08/2020   Paraplegia following spinal cord injury 12/08/2020   Wheelchair dependence 12/08/2020   Spasticity 12/08/2020   Chronic bilateral thoracic back pain 12/08/2020    ONSET DATE: 09/09/2022 (referral date)  REFERRING DIAG: R53.81 (ICD-10-CM) - Other malaise  THERAPY DIAG:  Muscle weakness (generalized)  Abnormal posture  Unsteadiness on feet  Difficulty in walking, not elsewhere  classified  Other abnormalities of gait and mobility  Rationale for Evaluation and Treatment: Rehabilitation  SUBJECTIVE:                                                                                                                                                                                             SUBJECTIVE STATEMENT: His back is aching worse today and the pollen is getting to him.  Pt accompanied by: self, friend  PERTINENT HISTORY: Transverse myelitis, neurogenic bowel and bladder  From hospital notes:  presented to [facility] ED on 08/25/2022 complaining of several history of BLE weakness, left greater than right. NS and neurology consulted. MRI with chronic changes. Neurology confirmed no TM diagnosis and MRI of complete spine negative for cord compression and stable cord hemorrhage as compared to July 2022. Complained of right ankle pain on 11/27 and started on colchicine for acute gout flare. Also complained of lingering cough and had mild fever to 100.6. Chest x-ray and blood cultures performed. Possible LLL pneumonia.   Admitted to inpatient rehab from 08/29/2022 - 09/10/2022.  Pt was ambulating at CGA level w/ toe cap.  Transferring supervision level. PAIN:   Are you having pain? Yes: NPRS scale: 5/10 Pain location: low back Pain description: achy Aggravating factors: activity Relieving factors: resting  PRECAUTIONS: Fall  WEIGHT BEARING RESTRICTIONS: No  FALLS: Has patient fallen in last 6 months? No  LIVING ENVIRONMENT: Lives with:  mother and father Lives in: House/apartment Stairs: Yes: Internal: 12 steps; on right going up and on left going up and External: 1 steps; none, pt has chair lift at home for stairs. Has following equipment at home: Gilford Rile - 2 wheeled, Wheelchair (manual), and bed side commode  PLOF: Independent with dressing, self care, transfer, parents help with cooking, cleaning.   PATIENT GOALS: "To get back  to where we were when I  discharged."  OBJECTIVE:  Measures assessed at initial evaluation unless otherwise noted:  LOWER EXTREMITY MMT:    MMT Right Eval Left Eval  Hip flexion 1/5 3/5  Hip extension    Hip abduction 1+/5 3+/5  Hip adduction 2+/5 3+/5  Hip internal rotation    Hip external rotation    Knee flexion    Knee extension    Ankle dorsiflexion    Ankle plantarflexion    Ankle inversion    Ankle eversion    (Blank rows = not tested)   TODAY'S TREATMENT:                                                                                                                              -10MWT:  24.35 seconds = 0.41 m/sec OR 1.36 ft/sec w/ RW -Pt ambulates x442' w/ RW and right toe cap, his left knee is bothering him during task -5xSTS w/ BUE:  40.97 seconds w/ BUE support  Reviewed HEP: -RLE heelslides w/ strap x5, remains challenging, LLE WNL for AROM knee flexion -Supine bridge w/ mini swiss ball x8, no challenge per observation, removed from HEP -Single leg bridge (modified to have contralateral LE straight on mat instead of in air) x8 each LE -Side-lying clamshells x6 no resistance RLE only, x6 w/ red theraband BLE -Standing hip abduction x5 each side, diminished ROM w/ RLE so removed in favor of lateral stepping and clamshells w/ better ROM -Verbally reviewed progressing STS to no UE support, pt verbalizes ongoing understanding. -Lateral stepping w/ BUE support x10' each direction -Standing march x5 each LE, diminished RLE ROM w/ minimal heel clearance, discussed continued practice of unlocking right knee, removed from HEP   PATIENT EDUCATION: Education details: Continue HEP (with modifications) and walking program.  Goal outcomes. Person educated: Patient Education method: Explanation Education comprehension: verbalized understanding  HOME EXERCISE PROGRAM: Access Code: [email or web address] URL: [email or web address] Date: 10/21/2022 Prepared by: Excell Seltzer  Exercises -  Supine Heel Slide with Strap  - 1 x daily - 7 x weekly - 3 sets - 10 reps - Clamshell w/ resistance  - 1 x daily - 7 x weekly - 3 sets - 10 reps (red theraband) - Sit to Stand Without Arm Support  - 1 x daily - 7 x weekly - 3 sets - 10 reps - Side Stepping with Counter Support  - 1 x daily - 7 x weekly - 3 sets - 10 reps - Single Leg Bridge  - 1 x daily - 7 x weekly - 3 sets - 10 reps (modified to have contralateral LE straight on mat instead of in air) - Standing Terminal Knee Extension with Resistance  - 1 x daily - 7 x weekly - 3 sets - 10 reps   Walking Program: Walk with your family. Walk during cooler times of the day. Starting this week, walk 5 min per day for 7 days. Following week  add 5 minutes to your total time. Week 1: 5 min Week 2: 10 min Week 3: 15 min Week 4: 20 min...Marland KitchenMarland KitchenUntil you can walk to 30 min per day    GOALS: Goals reviewed with patient? Yes  NEW SHORT TERM GOALS:   Target date: 01/03/2023  Patient will be able to ambulate >/=1000' with RW without needing a rest break to improve functional walking endurance and access to community. Baseline: 120' w/ RW no AFO/toe cap, 800 ft RW; 31' RW and toe cap (4/3) Goal status: NOT MET  2.  Pt will decrease 5xSTS to </=35 seconds w/ BUE support in order to demonstrate decreased risk for falls and improved functional bilateral LE strength and power.  Baseline: 45.25 seconds w/ BUE, 36.78 sec with one UE (3/15); 40.97 seconds w/ BUE support (4/3) Goal status: IN PROGRESS  3.  Pt will demonstrate a gait speed of >/=1.75 feet/sec using LRAD in order to decrease risk for falls. Baseline: 0.51 ft/sec, 1.46 ft/sec (3/15); 1.36 ft/sec w/ RW (4/3) Goal status: NOT MET   NEW LONG TERM GOALS:  Target date: 01/24/2023  Pt will be independent with final HEP for improved strength, balance, transfers and gait. Baseline:  Goal status: INITIAL  2.  Patient will be able to ambulate >/=1000' with RW without needing a rest break to  improve functional walking endurance and access to community. Baseline: 120' w/ RW no AFO/toe cap, 800 ft RW; 72' w/ RW (4/3) Goal status: REVISED  3.  Pt will decrease 5xSTS to </=30 seconds w/ BUE support in order to demonstrate decreased risk for falls and improved functional bilateral LE strength and power.  Baseline: 45.25 seconds w/ BUE, 36.78 sec with one UE (3/15) Goal status: INITIAL  4.  Pt will demonstrate a gait speed of >/=1.75 feet/sec using LRAD in order to decrease risk for falls. Baseline: 0.51 ft/sec, 1.46 ft/sec (3/15) Goal status: REVISED    ASSESSMENT:  CLINICAL IMPRESSION: Patient not performing at his usual functional level this session likely due to increased low back pain and left knee discomfort noted this session.  STGs assessed at onset of session.  He ambulates only 442 feet compared to 800 feet prior, but endorses left knee discomfort throughout.  Using a RW, he ambulates at a speed of 1.36 ft/sec which is decreased from assessment prior of 1.46 ft/sec.  His 5xSTS required some increase in time to 40.97 seconds to complete w/ BUE support.  Though he did not significantly improve this session he has overall been improving and continues to benefit from ongoing skilled PT to maximize potential as today was not fully representative of patient's current progress.    OBJECTIVE IMPAIRMENTS: Abnormal gait, decreased activity tolerance, decreased balance, decreased endurance, decreased mobility, difficulty walking, decreased ROM, decreased strength, improper body mechanics, and postural dysfunction.   ACTIVITY LIMITATIONS: carrying, lifting, bending, squatting, stairs, transfers, and locomotion level  PARTICIPATION LIMITATIONS: meal prep, cleaning, laundry, driving, community activity, and occupation  PERSONAL FACTORS: Age, Fitness, Past/current experiences, Time since onset of injury/illness/exacerbation, Transportation, and 1 comorbidity: ongoing question of transverse  myelitis vs other spinal cord pathology  are also affecting patient's functional outcome.   REHAB POTENTIAL: Good  CLINICAL DECISION MAKING: Evolving/moderate complexity  EVALUATION COMPLEXITY: Moderate  PLAN:  PT FREQUENCY: 1x/week  PT DURATION: 8 weeks + 6 weeks  PLANNED INTERVENTIONS: Therapeutic exercises, Therapeutic activity, Neuromuscular re-education, Balance training, Gait training, Patient/Family education, Self Care, Stair training, Vestibular training, Orthotic/Fit training, DME instructions, Manual therapy, and  Re-evaluation  PLAN FOR NEXT SESSION:  R strengthening/NMR, static unsupported balance-tilt board, balance beam, endurance-SciFit vs treadmill?, LBQC on left-continue to progress outdoor tolerance w/ toe cap, spanish squats-add to HEP?   Bary Richard, PT, DPT 01/01/2023, 2:42 PM

## 2023-01-01 NOTE — Patient Instructions (Signed)
Access Code: [email or web address] URL: [email or web address] Date: 01/01/2023 Prepared by: Elease Etienne  Exercises - Supine Heel Slide with Strap  - 1 x daily - 7 x weekly - 3 sets - 10 reps - Sit to Stand Without Arm Support  - 1 x daily - 7 x weekly - 3 sets - 10 reps - Side Stepping with Counter Support  - 1 x daily - 7 x weekly - 3 sets - 10 reps - Single Leg Bridge  - 1 x daily - 7 x weekly - 3 sets - 10 reps - Standing Terminal Knee Extension with Resistance  - 1 x daily - 7 x weekly - 3 sets - 10 reps - Clamshell with Resistance  - 1 x daily - 7 x weekly - 3 sets - 10 reps

## 2023-01-08 ENCOUNTER — Ambulatory Visit: Payer: Medicare Other | Admitting: Physical Therapy

## 2023-01-08 DIAGNOSIS — R2681 Unsteadiness on feet: Secondary | ICD-10-CM

## 2023-01-08 DIAGNOSIS — R262 Difficulty in walking, not elsewhere classified: Secondary | ICD-10-CM

## 2023-01-08 DIAGNOSIS — R293 Abnormal posture: Secondary | ICD-10-CM

## 2023-01-08 DIAGNOSIS — M6281 Muscle weakness (generalized): Secondary | ICD-10-CM

## 2023-01-08 NOTE — Therapy (Signed)
OUTPATIENT PHYSICAL THERAPY NEURO TREATMENT   Patient Name: Maurice Cruz MRN: [id number] DOB:[birth year], 57 y.o., male Today's Date: 01/08/2023   PCP: Vira Browns [facility]) REFERRING PROVIDER: Milinda Antis, PA-C   END OF SESSION:    PT End of Session - 01/08/23 1317     Visit Number 13    Number of Visits 15   recert   Date for PT Re-Evaluation 02/06/23   pushed out due to scheduling delays   Authorization Type MEDICARE PART A AND B    Progress Note Due on Visit 10    PT Start Time 1315    PT Stop Time 1358    PT Time Calculation (min) 43 min    Equipment Utilized During Treatment Gait belt    Activity Tolerance Patient tolerated treatment well    Behavior During Therapy WFL for tasks assessed/performed                Past Medical History:  Diagnosis Date   Anxiety    Asthma    as a child   Depression    ED (erectile dysfunction)    GERD (gastroesophageal reflux disease)    Hx of spinal cord injury 03/2020   Hypertension    Neurogenic bladder    self caths   Neurogenic bowel    has to be digitially stimulated - 04/24/21   Paraparesis    bilateral legs   Transverse myelitis    Past Surgical History:  Procedure Laterality Date   Anorectal biospy  02/09/2021   Colon Biospy  02/09/2021   IR ANGIO INTRA EXTRACRAN SEL COM CAROTID INNOMINATE BILAT MOD SED  04/25/2021   IR ANGIO VERTEBRAL SEL SUBCLAVIAN INNOMINATE UNI L MOD SED  04/25/2021   IR ANGIO VERTEBRAL SEL VERTEBRAL UNI R MOD SED  04/25/2021   IR ANGIO/SPINAL LEFT  04/25/2021   IR ANGIO/SPINAL LEFT  04/25/2021   IR ANGIO/SPINAL LEFT  04/25/2021   IR ANGIO/SPINAL LEFT  04/25/2021   IR ANGIO/SPINAL LEFT  04/25/2021   IR ANGIO/SPINAL LEFT  04/25/2021   IR ANGIO/SPINAL LEFT  04/25/2021   IR ANGIO/SPINAL LEFT  04/25/2021   IR ANGIO/SPINAL LEFT  04/25/2021   IR ANGIO/SPINAL LEFT  04/25/2021   IR ANGIO/SPINAL LEFT  04/25/2021   IR ANGIO/SPINAL LEFT  04/25/2021   IR ANGIO/SPINAL LEFT  04/25/2021    IR ANGIO/SPINAL RIGHT  04/25/2021   IR ANGIO/SPINAL RIGHT  04/25/2021   IR ANGIO/SPINAL RIGHT  04/25/2021   IR ANGIO/SPINAL RIGHT  04/25/2021   IR ANGIO/SPINAL RIGHT  04/25/2021   IR ANGIO/SPINAL RIGHT  04/25/2021   IR ANGIO/SPINAL RIGHT  04/25/2021   IR ANGIO/SPINAL RIGHT  04/25/2021   IR ANGIO/SPINAL RIGHT  04/25/2021   IR ANGIO/SPINAL RIGHT  04/25/2021   IR ANGIO/SPINAL RIGHT  04/25/2021   IR ANGIO/SPINAL RIGHT  04/25/2021   IR ANGIO/SPINAL RIGHT  04/25/2021   IR ANGIO/SPINAL RIGHT  04/25/2021   IR ANGIOGRAM EXTREMITY BILATERAL  04/25/2021   IR RADIOLOGIST EVAL & MGMT  03/21/2021   RADIOLOGY WITH ANESTHESIA N/A 04/25/2021   Procedure: IR WITH ANESTHESIA SPINAL ANGIOGRAM;  Surgeon: Julieanne Cotton, MD;  Location: [facility] OR;  Service: Radiology;  Laterality: N/A;   Patient Active Problem List   Diagnosis Date Noted   Acute gout of right ankle 09/10/2022   Anemia of chronic disease 09/09/2022   Adjustment disorder with depressed mood 09/04/2022   Debility 08/30/2022   Spinal cord disorder 08/29/2022   AKI (acute kidney injury)  08/26/2022   Normocytic anemia 08/26/2022   Left leg pain 08/26/2022   Pleural effusion 08/26/2022   Anxiety 08/26/2022   GERD (gastroesophageal reflux disease) 08/26/2022   Essential hypertension 08/26/2022   Hyperlipidemia 08/26/2022   Leg weakness 08/25/2022   Spinal cord hemorrhage 05/21/2021   Anti-RNP antibodies present 05/21/2021   Weakness of both lower extremities 02/05/2021   Carcinoma in situ 01/03/2021   Neurogenic bladder 12/08/2020   Neurogenic bowel 12/08/2020   Paraplegia following spinal cord injury 12/08/2020   Wheelchair dependence 12/08/2020   Spasticity 12/08/2020   Chronic bilateral thoracic back pain 12/08/2020    ONSET DATE: 09/09/2022 (referral date)  REFERRING DIAG: R53.81 (ICD-10-CM) - Other malaise  THERAPY DIAG:  Muscle weakness (generalized)  Abnormal posture  Unsteadiness on feet  Difficulty in walking, not elsewhere  classified  Rationale for Evaluation and Treatment: Rehabilitation  SUBJECTIVE:                                                                                                                                                                                             SUBJECTIVE STATEMENT: Pt reports no falls and no acute changes since last session. Pt with ongoing chronic back pain (4/10). Pt reports sensation, etc have improved in RLE but still lacking full "functionality" of limb, LLE also feels weaker but sensation has improved.  Pt accompanied by: self, friend  PERTINENT HISTORY: Transverse myelitis, neurogenic bowel and bladder  From hospital notes:  presented to [facility] ED on 08/25/2022 complaining of several history of BLE weakness, left greater than right. NS and neurology consulted. MRI with chronic changes. Neurology confirmed no TM diagnosis and MRI of complete spine negative for cord compression and stable cord hemorrhage as compared to July 2022. Complained of right ankle pain on 11/27 and started on colchicine for acute gout flare. Also complained of lingering cough and had mild fever to 100.6. Chest x-ray and blood cultures performed. Possible LLL pneumonia.   Admitted to inpatient rehab from 08/29/2022 - 09/10/2022.  Pt was ambulating at CGA level w/ toe cap.  Transferring supervision level. PAIN:   Are you having pain? Yes: NPRS scale: 4/10 Pain location: low back Pain description: achy Aggravating factors: activity Relieving factors: resting  PRECAUTIONS: Fall  WEIGHT BEARING RESTRICTIONS: No  FALLS: Has patient fallen in last 6 months? No  LIVING ENVIRONMENT: Lives with:  mother and father Lives in: House/apartment Stairs: Yes: Internal: 12 steps; on right going up and on left going up and External: 1 steps; none, pt has chair lift at home for stairs. Has following equipment at home: Dan Humphreys - 2 wheeled, Wheelchair (manual),  and bed side commode  PLOF: Independent  with dressing, self care, transfer, parents help with cooking, cleaning.   PATIENT GOALS: "To get back to where we were when I discharged."  OBJECTIVE:  Measures assessed at initial evaluation unless otherwise noted:  LOWER EXTREMITY MMT:    MMT Right Eval Left Eval  Hip flexion 1/5 3/5  Hip extension    Hip abduction 1+/5 3+/5  Hip adduction 2+/5 3+/5  Hip internal rotation    Hip external rotation    Knee flexion    Knee extension    Ankle dorsiflexion    Ankle plantarflexion    Ankle inversion    Ankle eversion    (Blank rows = not tested)   TODAY'S TREATMENT:                                                                                                                              TherEx Spanish squats x 10 reps with GTB Decreased wt shift to the R, able to correct with verbal cueing  NMR In quadruped on mat table Alt UE lifts x 5 reps B Alt LE lifts x 5 reps B Difficulty with RLE, needs manual assist for hip flexion  In tall-kneeling with bench on mat table: Mini-squats 3 x 10 reps with BUE support and CGA Lateral sidestepping R/L with min A to advance distal RLE 2 x 10 ft each direction  In // bars with BUE support to work on R hip and HS strengthening: 4" hurdles Foam beam step overs forwards Lateral step overs of pole   PATIENT EDUCATION: Education details: Continue HEP (with modifications) and walking program. Person educated: Patient Education method: Explanation Education comprehension: verbalized understanding  HOME EXERCISE PROGRAM: Access Code: [email or web address] URL: [email or web address] Date: 10/21/2022 Prepared by: Peter Congo  Exercises - Supine Heel Slide with Strap  - 1 x daily - 7 x weekly - 3 sets - 10 reps - Clamshell w/ resistance  - 1 x daily - 7 x weekly - 3 sets - 10 reps (red theraband) - Sit to Stand Without Arm Support  - 1 x daily - 7 x weekly - 3 sets - 10 reps - Side Stepping with Counter Support  - 1 x daily -  7 x weekly - 3 sets - 10 reps - Single Leg Bridge  - 1 x daily - 7 x weekly - 3 sets - 10 reps (modified to have contralateral LE straight on mat instead of in air) - Standing Terminal Knee Extension with Resistance  - 1 x daily - 7 x weekly - 3 sets - 10 reps   Walking Program: Walk with your family. Walk during cooler times of the day. Starting this week, walk 5 min per day for 7 days. Following week add 5 minutes to your total time. Week 1: 5 min Week 2: 10 min Week 3: 15 min Week 4: 20 min...Marland KitchenMarland KitchenUntil you can walk to 30 min per  day    GOALS: Goals reviewed with patient? Yes  NEW SHORT TERM GOALS:   Target date: 01/03/2023  Patient will be able to ambulate >/=1000' with RW without needing a rest break to improve functional walking endurance and access to community. Baseline: 120' w/ RW no AFO/toe cap, 800 ft RW; 442' RW and toe cap (4/3) Goal status: NOT MET  2.  Pt will decrease 5xSTS to </=35 seconds w/ BUE support in order to demonstrate decreased risk for falls and improved functional bilateral LE strength and power.  Baseline: 45.25 seconds w/ BUE, 36.78 sec with one UE (3/15); 40.97 seconds w/ BUE support (4/3) Goal status: IN PROGRESS  3.  Pt will demonstrate a gait speed of >/=1.75 feet/sec using LRAD in order to decrease risk for falls. Baseline: 0.51 ft/sec, 1.46 ft/sec (3/15); 1.36 ft/sec w/ RW (4/3) Goal status: NOT MET   NEW LONG TERM GOALS:  Target date: 01/24/2023  Pt will be independent with final HEP for improved strength, balance, transfers and gait. Baseline:  Goal status: INITIAL  2.  Patient will be able to ambulate >/=1000' with RW without needing a rest break to improve functional walking endurance and access to community. Baseline: 120' w/ RW no AFO/toe cap, 800 ft RW; 442' w/ RW (4/3) Goal status: REVISED  3.  Pt will decrease 5xSTS to </=30 seconds w/ BUE support in order to demonstrate decreased risk for falls and improved functional bilateral LE  strength and power.  Baseline: 45.25 seconds w/ BUE, 36.78 sec with one UE (3/15) Goal status: INITIAL  4.  Pt will demonstrate a gait speed of >/=1.75 feet/sec using LRAD in order to decrease risk for falls. Baseline: 0.51 ft/sec, 1.46 ft/sec (3/15) Goal status: REVISED    ASSESSMENT:  CLINICAL IMPRESSION: Emphasis of skilled PT session on working on RLE NMR in tall-kneeling and quadruped positions, increasing RLE hip and knee flexor strength to increase step height and clearance with gait, and working on equal WB through BLE with squats. Pt continues to exhibit significant RLE hip flexor and abductor weakness as well as R hamstring weakness leading to ongoing gait impairments. Pt continues to benefit from skilled therapy services to work towards LTGs. Continue POC.   OBJECTIVE IMPAIRMENTS: Abnormal gait, decreased activity tolerance, decreased balance, decreased endurance, decreased mobility, difficulty walking, decreased ROM, decreased strength, improper body mechanics, and postural dysfunction.   ACTIVITY LIMITATIONS: carrying, lifting, bending, squatting, stairs, transfers, and locomotion level  PARTICIPATION LIMITATIONS: meal prep, cleaning, laundry, driving, community activity, and occupation  PERSONAL FACTORS: Age, Fitness, Past/current experiences, Time since onset of injury/illness/exacerbation, Transportation, and 1 comorbidity: ongoing question of transverse myelitis vs other spinal cord pathology  are also affecting patient's functional outcome.   REHAB POTENTIAL: Good  CLINICAL DECISION MAKING: Evolving/moderate complexity  EVALUATION COMPLEXITY: Moderate  PLAN:  PT FREQUENCY: 1x/week  PT DURATION: 8 weeks + 6 weeks  PLANNED INTERVENTIONS: Therapeutic exercises, Therapeutic activity, Neuromuscular re-education, Balance training, Gait training, Patient/Family education, Self Care, Stair training, Vestibular training, Orthotic/Fit training, DME instructions, Manual  therapy, and Re-evaluation  PLAN FOR NEXT SESSION:  R strengthening/NMR, static unsupported balance-tilt board, balance beam, endurance-SciFit vs treadmill?, LBQC on left-continue to progress outdoor tolerance w/ toe cap, spanish squats-add to HEP?, discuss PT POC   Peter Congoaylor  Delbert Vu, PT, DPT, CSRS 01/08/2023, 2:03 PM

## 2023-01-14 ENCOUNTER — Ambulatory Visit: Payer: Medicare Other | Admitting: Physical Therapy

## 2023-01-14 DIAGNOSIS — M6281 Muscle weakness (generalized): Secondary | ICD-10-CM

## 2023-01-14 DIAGNOSIS — R2681 Unsteadiness on feet: Secondary | ICD-10-CM

## 2023-01-14 DIAGNOSIS — R2689 Other abnormalities of gait and mobility: Secondary | ICD-10-CM

## 2023-01-14 DIAGNOSIS — G373 Acute transverse myelitis in demyelinating disease of central nervous system: Secondary | ICD-10-CM

## 2023-01-14 DIAGNOSIS — R262 Difficulty in walking, not elsewhere classified: Secondary | ICD-10-CM

## 2023-01-14 DIAGNOSIS — R293 Abnormal posture: Secondary | ICD-10-CM

## 2023-01-14 NOTE — Therapy (Signed)
OUTPATIENT PHYSICAL THERAPY NEURO TREATMENT   Patient Name: Maurice Cruz MRN: [id number] DOB:12-07-1965, 57 y.o., male Today's Date: 01/14/2023   PCP: Vira Browns [facility]) REFERRING PROVIDER: Milinda Antis, PA-C   END OF SESSION:    PT End of Session - 01/14/23 1321     Visit Number 14    Number of Visits 15   recert   Date for PT Re-Evaluation 02/06/23   pushed out due to scheduling delays   Authorization Type MEDICARE PART A AND B    Progress Note Due on Visit 10    PT Start Time 1318    PT Stop Time 1400    PT Time Calculation (min) 42 min    Equipment Utilized During Treatment Gait belt    Activity Tolerance Patient tolerated treatment well    Behavior During Therapy WFL for tasks assessed/performed                 Past Medical History:  Diagnosis Date   Anxiety    Asthma    as a child   Depression    ED (erectile dysfunction)    GERD (gastroesophageal reflux disease)    Hx of spinal cord injury 03/2020   Hypertension    Neurogenic bladder    self caths   Neurogenic bowel    has to be digitially stimulated - 04/24/21   Paraparesis    bilateral legs   Transverse myelitis    Past Surgical History:  Procedure Laterality Date   Anorectal biospy  02/09/2021   Colon Biospy  02/09/2021   IR ANGIO INTRA EXTRACRAN SEL COM CAROTID INNOMINATE BILAT MOD SED  04/25/2021   IR ANGIO VERTEBRAL SEL SUBCLAVIAN INNOMINATE UNI L MOD SED  04/25/2021   IR ANGIO VERTEBRAL SEL VERTEBRAL UNI R MOD SED  04/25/2021   IR ANGIO/SPINAL LEFT  04/25/2021   IR ANGIO/SPINAL LEFT  04/25/2021   IR ANGIO/SPINAL LEFT  04/25/2021   IR ANGIO/SPINAL LEFT  04/25/2021   IR ANGIO/SPINAL LEFT  04/25/2021   IR ANGIO/SPINAL LEFT  04/25/2021   IR ANGIO/SPINAL LEFT  04/25/2021   IR ANGIO/SPINAL LEFT  04/25/2021   IR ANGIO/SPINAL LEFT  04/25/2021   IR ANGIO/SPINAL LEFT  04/25/2021   IR ANGIO/SPINAL LEFT  04/25/2021   IR ANGIO/SPINAL LEFT  04/25/2021   IR ANGIO/SPINAL LEFT  04/25/2021    IR ANGIO/SPINAL RIGHT  04/25/2021   IR ANGIO/SPINAL RIGHT  04/25/2021   IR ANGIO/SPINAL RIGHT  04/25/2021   IR ANGIO/SPINAL RIGHT  04/25/2021   IR ANGIO/SPINAL RIGHT  04/25/2021   IR ANGIO/SPINAL RIGHT  04/25/2021   IR ANGIO/SPINAL RIGHT  04/25/2021   IR ANGIO/SPINAL RIGHT  04/25/2021   IR ANGIO/SPINAL RIGHT  04/25/2021   IR ANGIO/SPINAL RIGHT  04/25/2021   IR ANGIO/SPINAL RIGHT  04/25/2021   IR ANGIO/SPINAL RIGHT  04/25/2021   IR ANGIO/SPINAL RIGHT  04/25/2021   IR ANGIO/SPINAL RIGHT  04/25/2021   IR ANGIOGRAM EXTREMITY BILATERAL  04/25/2021   IR RADIOLOGIST EVAL & MGMT  03/21/2021   RADIOLOGY WITH ANESTHESIA N/A 04/25/2021   Procedure: IR WITH ANESTHESIA SPINAL ANGIOGRAM;  Surgeon: Julieanne Cotton, MD;  Location: [facility] OR;  Service: Radiology;  Laterality: N/A;   Patient Active Problem List   Diagnosis Date Noted   Acute gout of right ankle 09/10/2022   Anemia of chronic disease 09/09/2022   Adjustment disorder with depressed mood 09/04/2022   Debility 08/30/2022   Spinal cord disorder 08/29/2022   AKI (acute kidney  injury) 08/26/2022   Normocytic anemia 08/26/2022   Left leg pain 08/26/2022   Pleural effusion 08/26/2022   Anxiety 08/26/2022   GERD (gastroesophageal reflux disease) 08/26/2022   Essential hypertension 08/26/2022   Hyperlipidemia 08/26/2022   Leg weakness 08/25/2022   Spinal cord hemorrhage 05/21/2021   Anti-RNP antibodies present 05/21/2021   Weakness of both lower extremities 02/05/2021   Carcinoma in situ 01/03/2021   Neurogenic bladder 12/08/2020   Neurogenic bowel 12/08/2020   Paraplegia following spinal cord injury 12/08/2020   Wheelchair dependence 12/08/2020   Spasticity 12/08/2020   Chronic bilateral thoracic back pain 12/08/2020    ONSET DATE: 09/09/2022 (referral date)  REFERRING DIAG: R53.81 (ICD-10-CM) - Other malaise  THERAPY DIAG:  Muscle weakness (generalized)  Abnormal posture  Unsteadiness on feet  Difficulty in walking, not elsewhere  classified  Other abnormalities of gait and mobility  Transverse myelitis  Rationale for Evaluation and Treatment: Rehabilitation  SUBJECTIVE:                                                                                                                                                                                             SUBJECTIVE STATEMENT: Pt reports he is doing well, no acute changes since last visit. Pt with ongoing chronic back pain.  Pt accompanied by: self, friend  PERTINENT HISTORY: Transverse myelitis, neurogenic bowel and bladder  From hospital notes:  presented to [facility] ED on 08/25/2022 complaining of several history of BLE weakness, left greater than right. NS and neurology consulted. MRI with chronic changes. Neurology confirmed no TM diagnosis and MRI of complete spine negative for cord compression and stable cord hemorrhage as compared to July 2022. Complained of right ankle pain on 11/27 and started on colchicine for acute gout flare. Also complained of lingering cough and had mild fever to 100.6. Chest x-ray and blood cultures performed. Possible LLL pneumonia.   Admitted to inpatient rehab from 08/29/2022 - 09/10/2022.  Pt was ambulating at CGA level w/ toe cap.  Transferring supervision level. PAIN:   Are you having pain? Yes: NPRS scale: 4/10 Pain location: low back Pain description: achy Aggravating factors: activity Relieving factors: resting  PRECAUTIONS: Fall  WEIGHT BEARING RESTRICTIONS: No  FALLS: Has patient fallen in last 6 months? No  LIVING ENVIRONMENT: Lives with:  mother and father Lives in: House/apartment Stairs: Yes: Internal: 12 steps; on right going up and on left going up and External: 1 steps; none, pt has chair lift at home for stairs. Has following equipment at home: Dan Humphreys - 2 wheeled, Wheelchair (manual), and bed side commode  PLOF: Independent with dressing, self care, transfer,  parents help with cooking, cleaning.   PATIENT  GOALS: "To get back to where we were when I discharged."  OBJECTIVE:  Measures assessed at initial evaluation unless otherwise noted:  LOWER EXTREMITY MMT:    MMT Right Eval Left Eval  Hip flexion 1/5 3/5  Hip extension    Hip abduction 1+/5 3+/5  Hip adduction 2+/5 3+/5  Hip internal rotation    Hip external rotation    Knee flexion    Knee extension    Ankle dorsiflexion    Ankle plantarflexion    Ankle inversion    Ankle eversion    (Blank rows = not tested)   TODAY'S TREATMENT:                                                                                                                              TherEx Spanish squats x 10 reps with RTB Decreased wt shift to the R, able to correct with verbal cueing Pt also noted to have increase in hip abduction with increased depth of squat so transitioned to squats with ball between knees Squat with balls between knees X 10 reps, added to HEP  NMR In tall-kneeling on mat on floor next to mat table: Mini-squats x 10 reps with BUE support and CGA  In half-kneeling on floor on mat: L/R half-kneel balance 2 x 30 sec each, more difficulty when RLE down  In standing with LLE on blue dynadisc: Ball toss x 30 reps with CGA for balance  PATIENT EDUCATION: Education details: Continue HEP (with modifications) and walking program, PT POC Person educated: Patient Education method: Explanation and Handouts Education comprehension: verbalized understanding  HOME EXERCISE PROGRAM: Access Code: [email or web address] URL: [email or web address] Date: 10/21/2022 Prepared by: Peter Congo  Exercises - Supine Heel Slide with Strap  - 1 x daily - 7 x weekly - 3 sets - 10 reps - Clamshell w/ resistance  - 1 x daily - 7 x weekly - 3 sets - 10 reps (red theraband) - Sit to Stand Without Arm Support  - 1 x daily - 7 x weekly - 3 sets - 10 reps - Side Stepping with Counter Support  - 1 x daily - 7 x weekly - 3 sets - 10 reps - Single Leg  Bridge  - 1 x daily - 7 x weekly - 3 sets - 10 reps (modified to have contralateral LE straight on mat instead of in air) - Standing Terminal Knee Extension with Resistance  - 1 x daily - 7 x weekly - 3 sets - 10 reps - Squat With Ball Between Knees  - 1 x daily - 7 x weekly - 3 sets - 10 reps   Walking Program: Walk with your family. Walk during cooler times of the day. Starting this week, walk 5 min per day for 7 days. Following week add 5 minutes to your total time. Week 1: 5 min Week 2: 10 min Week 3: 15  min Week 4: 20 min...Marland KitchenMarland KitchenUntil you can walk to 30 min per day    GOALS: Goals reviewed with patient? Yes  NEW SHORT TERM GOALS:   Target date: 01/03/2023  Patient will be able to ambulate >/=1000' with RW without needing a rest break to improve functional walking endurance and access to community. Baseline: 120' w/ RW no AFO/toe cap, 800 ft RW; 442' RW and toe cap (4/3) Goal status: NOT MET  2.  Pt will decrease 5xSTS to </=35 seconds w/ BUE support in order to demonstrate decreased risk for falls and improved functional bilateral LE strength and power.  Baseline: 45.25 seconds w/ BUE, 36.78 sec with one UE (3/15); 40.97 seconds w/ BUE support (4/3) Goal status: IN PROGRESS  3.  Pt will demonstrate a gait speed of >/=1.75 feet/sec using LRAD in order to decrease risk for falls. Baseline: 0.51 ft/sec, 1.46 ft/sec (3/15); 1.36 ft/sec w/ RW (4/3) Goal status: NOT MET   NEW LONG TERM GOALS:  Target date: 01/24/2023  Pt will be independent with final HEP for improved strength, balance, transfers and gait. Baseline:  Goal status: INITIAL  2.  Patient will be able to ambulate >/=1000' with RW without needing a rest break to improve functional walking endurance and access to community. Baseline: 120' w/ RW no AFO/toe cap, 800 ft RW; 442' w/ RW (4/3) Goal status: REVISED  3.  Pt will decrease 5xSTS to </=30 seconds w/ BUE support in order to demonstrate decreased risk for falls and  improved functional bilateral LE strength and power.  Baseline: 45.25 seconds w/ BUE, 36.78 sec with one UE (3/15) Goal status: INITIAL  4.  Pt will demonstrate a gait speed of >/=1.75 feet/sec using LRAD in order to decrease risk for falls. Baseline: 0.51 ft/sec, 1.46 ft/sec (3/15) Goal status: REVISED    ASSESSMENT:  CLINICAL IMPRESSION: Emphasis of skilled PT session on continuing to work on RLE NMR in half-kneeling, tall-kneeling, and in standing. Also worked on transfers mat table to/from floor, pt able to perform safely with good technique noted. Added mini-squats with ball between knees to HEP to continue to work on LE strengthening and quad control. Pt agreeable to plan to d/c from OPPT next session, pleased with his progress so far. Continue POC.   OBJECTIVE IMPAIRMENTS: Abnormal gait, decreased activity tolerance, decreased balance, decreased endurance, decreased mobility, difficulty walking, decreased ROM, decreased strength, improper body mechanics, and postural dysfunction.   ACTIVITY LIMITATIONS: carrying, lifting, bending, squatting, stairs, transfers, and locomotion level  PARTICIPATION LIMITATIONS: meal prep, cleaning, laundry, driving, community activity, and occupation  PERSONAL FACTORS: Age, Fitness, Past/current experiences, Time since onset of injury/illness/exacerbation, Transportation, and 1 comorbidity: ongoing question of transverse myelitis vs other spinal cord pathology  are also affecting patient's functional outcome.   REHAB POTENTIAL: Good  CLINICAL DECISION MAKING: Evolving/moderate complexity  EVALUATION COMPLEXITY: Moderate  PLAN:  PT FREQUENCY: 1x/week  PT DURATION: 8 weeks + 6 weeks  PLANNED INTERVENTIONS: Therapeutic exercises, Therapeutic activity, Neuromuscular re-education, Balance training, Gait training, Patient/Family education, Self Care, Stair training, Vestibular training, Orthotic/Fit training, DME instructions, Manual therapy, and  Re-evaluation  PLAN FOR NEXT SESSION: assess LTG and d/c from PT   Peter Congo, PT, DPT, CSRS 01/14/2023, 2:06 PM

## 2023-01-21 ENCOUNTER — Ambulatory Visit: Payer: Medicare Other | Admitting: Physical Therapy

## 2023-01-21 DIAGNOSIS — M6281 Muscle weakness (generalized): Secondary | ICD-10-CM

## 2023-01-21 DIAGNOSIS — R2689 Other abnormalities of gait and mobility: Secondary | ICD-10-CM

## 2023-01-21 DIAGNOSIS — R2681 Unsteadiness on feet: Secondary | ICD-10-CM

## 2023-01-21 DIAGNOSIS — R293 Abnormal posture: Secondary | ICD-10-CM

## 2023-01-21 DIAGNOSIS — R262 Difficulty in walking, not elsewhere classified: Secondary | ICD-10-CM

## 2023-01-21 NOTE — Therapy (Signed)
OUTPATIENT PHYSICAL THERAPY NEURO TREATMENT-DISCHARGE NOTE   Patient Name: Maurice Cruz MRN: [id number] DOB:[birth year], 57 y.o., male Today's Date: 01/21/2023   PCP: [facility]) REFERRING PROVIDER: Milinda Antis, PA-C   PHYSICAL THERAPY DISCHARGE SUMMARY  Visits from Start of Care: 15  Current functional level related to goals / functional outcomes: Mod I with RW   Remaining deficits: Decreased RLE strength   Education / Equipment: Handout for HEP   Patient agrees to discharge. Patient goals were met. Patient is being discharged due to meeting the stated rehab goals.   END OF SESSION:    PT End of Session - 01/21/23 1319     Visit Number 15    Number of Visits 15   recert   Date for PT Re-Evaluation 02/06/23   pushed out due to scheduling delays   Authorization Type MEDICARE PART A AND B    Progress Note Due on Visit 10    PT Start Time 1315    PT Stop Time 1351   graduation day   PT Time Calculation (min) 36 min    Equipment Utilized During Treatment Gait belt    Activity Tolerance Patient tolerated treatment well    Behavior During Therapy WFL for tasks assessed/performed                  Past Medical History:  Diagnosis Date   Anxiety    Asthma    as a child   Depression    ED (erectile dysfunction)    GERD (gastroesophageal reflux disease)    Hx of spinal cord injury 03/2020   Hypertension    Neurogenic bladder    self caths   Neurogenic bowel    has to be digitially stimulated - 04/24/21   Paraparesis    bilateral legs   Transverse myelitis    Past Surgical History:  Procedure Laterality Date   Anorectal biospy  02/09/2021   Colon Biospy  02/09/2021   IR ANGIO INTRA EXTRACRAN SEL COM CAROTID INNOMINATE BILAT MOD SED  04/25/2021   IR ANGIO VERTEBRAL SEL SUBCLAVIAN INNOMINATE UNI L MOD SED  04/25/2021   IR ANGIO VERTEBRAL SEL VERTEBRAL UNI R MOD SED  04/25/2021   IR ANGIO/SPINAL LEFT  04/25/2021   IR  ANGIO/SPINAL LEFT  04/25/2021   IR ANGIO/SPINAL LEFT  04/25/2021   IR ANGIO/SPINAL LEFT  04/25/2021   IR ANGIO/SPINAL LEFT  04/25/2021   IR ANGIO/SPINAL LEFT  04/25/2021   IR ANGIO/SPINAL LEFT  04/25/2021   IR ANGIO/SPINAL LEFT  04/25/2021   IR ANGIO/SPINAL LEFT  04/25/2021   IR ANGIO/SPINAL LEFT  04/25/2021   IR ANGIO/SPINAL LEFT  04/25/2021   IR ANGIO/SPINAL LEFT  04/25/2021   IR ANGIO/SPINAL LEFT  04/25/2021   IR ANGIO/SPINAL RIGHT  04/25/2021   IR ANGIO/SPINAL RIGHT  04/25/2021   IR ANGIO/SPINAL RIGHT  04/25/2021   IR ANGIO/SPINAL RIGHT  04/25/2021   IR ANGIO/SPINAL RIGHT  04/25/2021   IR ANGIO/SPINAL RIGHT  04/25/2021   IR ANGIO/SPINAL RIGHT  04/25/2021   IR ANGIO/SPINAL RIGHT  04/25/2021   IR ANGIO/SPINAL RIGHT  04/25/2021   IR ANGIO/SPINAL RIGHT  04/25/2021   IR ANGIO/SPINAL RIGHT  04/25/2021   IR ANGIO/SPINAL RIGHT  04/25/2021   IR ANGIO/SPINAL RIGHT  04/25/2021   IR ANGIO/SPINAL RIGHT  04/25/2021   IR ANGIOGRAM EXTREMITY BILATERAL  04/25/2021   IR RADIOLOGIST EVAL & MGMT  03/21/2021   RADIOLOGY WITH ANESTHESIA N/A 04/25/2021   Procedure: IR  WITH ANESTHESIA SPINAL ANGIOGRAM;  Surgeon: Julieanne Cotton, MD;  Location: [facility] OR;  Service: Radiology;  Laterality: N/A;   Patient Active Problem List   Diagnosis Date Noted   Acute gout of right ankle 09/10/2022   Anemia of chronic disease 09/09/2022   Adjustment disorder with depressed mood 09/04/2022   Debility 08/30/2022   Spinal cord disorder 08/29/2022   AKI (acute kidney injury) 08/26/2022   Normocytic anemia 08/26/2022   Left leg pain 08/26/2022   Pleural effusion 08/26/2022   Anxiety 08/26/2022   GERD (gastroesophageal reflux disease) 08/26/2022   Essential hypertension 08/26/2022   Hyperlipidemia 08/26/2022   Leg weakness 08/25/2022   Spinal cord hemorrhage 05/21/2021   Anti-RNP antibodies present 05/21/2021   Weakness of both lower extremities 02/05/2021   Carcinoma in situ 01/03/2021   Neurogenic bladder 12/08/2020   Neurogenic  bowel 12/08/2020   Paraplegia following spinal cord injury 12/08/2020   Wheelchair dependence 12/08/2020   Spasticity 12/08/2020   Chronic bilateral thoracic back pain 12/08/2020    ONSET DATE: 09/09/2022 (referral date)  REFERRING DIAG: R53.81 (ICD-10-CM) - Other malaise  THERAPY DIAG:  Muscle weakness (generalized)  Abnormal posture  Unsteadiness on feet  Difficulty in walking, not elsewhere classified  Other abnormalities of gait and mobility  Rationale for Evaluation and Treatment: Rehabilitation  SUBJECTIVE:                                                                                                                                                                                             SUBJECTIVE STATEMENT: Pt reports no acute changes since last visit, agreeable with plan to d/c today.  Pt accompanied by: self, dad  PERTINENT HISTORY: Transverse myelitis, neurogenic bowel and bladder  From hospital notes:  presented to [facility] ED on 08/25/2022 complaining of several history of BLE weakness, left greater than right. NS and neurology consulted. MRI with chronic changes. Neurology confirmed no TM diagnosis and MRI of complete spine negative for cord compression and stable cord hemorrhage as compared to July 2022. Complained of right ankle pain on 11/27 and started on colchicine for acute gout flare. Also complained of lingering cough and had mild fever to 100.6. Chest x-ray and blood cultures performed. Possible LLL pneumonia.   Admitted to inpatient rehab from 08/29/2022 - 09/10/2022.  Pt was ambulating at CGA level w/ toe cap.  Transferring supervision level. PAIN:   Are you having pain? Yes: NPRS scale: 4/10 Pain location: low back Pain description: achy Aggravating factors: activity Relieving factors: resting  PRECAUTIONS: Fall  WEIGHT BEARING RESTRICTIONS: No  FALLS: Has patient fallen in last 6 months? No  LIVING ENVIRONMENT: Lives with:  mother and  father Lives in: House/apartment Stairs: Yes: Internal: 12 steps; on right going up and on left going up and External: 1 steps; none, pt has chair lift at home for stairs. Has following equipment at home: Dan Humphreys - 2 wheeled, Wheelchair (manual), and bed side commode  PLOF: Independent with dressing, self care, transfer, parents help with cooking, cleaning.   PATIENT GOALS: "To get back to where we were when I discharged."  OBJECTIVE:  Measures assessed at initial evaluation unless otherwise noted:  LOWER EXTREMITY MMT:    MMT Right Eval Left Eval  Hip flexion 1/5 3/5  Hip extension    Hip abduction 1+/5 3+/5  Hip adduction 2+/5 3+/5  Hip internal rotation    Hip external rotation    Knee flexion    Knee extension    Ankle dorsiflexion    Ankle plantarflexion    Ankle inversion    Ankle eversion    (Blank rows = not tested)   TODAY'S TREATMENT:                                                                                                                               TherAct  [facility] PT Assessment - 01/21/23 1345       Ambulation/Gait   Gait velocity 32.8 ft over 17 sec = 1.93 ft/sec      Standardized Balance Assessment   Standardized Balance Assessment Five Times Sit to Stand;Timed Up and Go Test    Five times sit to stand comments  27.37   pushing up from mat table BUE           Gait Gait pattern: decreased hip/knee flexion- Right, decreased ankle dorsiflexion- Right, circumduction- Right, and trunk flexed Distance walked: 1000 ft (+) Assistive device utilized: Environmental consultant - 2 wheeled Level of assistance: Modified independence Comments: gait outdoors across uneven sidewalks, up/down inclines, and across grass; pt with difficulty advancing RLE across grass and needs seated rest break before continuing on sidewalk   PATIENT EDUCATION: Education details: Continue HEP (with modifications) and walking program Person educated: Patient Education method:  Explanation Education comprehension: verbalized understanding  HOME EXERCISE PROGRAM: Access Code: [email or web address] URL: [email or web address] Date: 10/21/2022 Prepared by: Peter Congo  Exercises - Supine Heel Slide with Strap  - 1 x daily - 7 x weekly - 3 sets - 10 reps - Clamshell w/ resistance  - 1 x daily - 7 x weekly - 3 sets - 10 reps (red theraband) - Sit to Stand Without Arm Support  - 1 x daily - 7 x weekly - 3 sets - 10 reps - Side Stepping with Counter Support  - 1 x daily - 7 x weekly - 3 sets - 10 reps - Single Leg Bridge  - 1 x daily - 7 x weekly - 3 sets - 10 reps (modified to have contralateral LE straight on mat instead of in air) - Standing Terminal  Knee Extension with Resistance  - 1 x daily - 7 x weekly - 3 sets - 10 reps - Squat With Ball Between Knees  - 1 x daily - 7 x weekly - 3 sets - 10 reps   Walking Program: Walk with your family. Walk during cooler times of the day. Starting this week, walk 5 min per day for 7 days. Following week add 5 minutes to your total time. Week 1: 5 min Week 2: 10 min Week 3: 15 min Week 4: 20 min...Marland KitchenMarland KitchenUntil you can walk to 30 min per day    GOALS: Goals reviewed with patient? Yes  NEW SHORT TERM GOALS:   Target date: 01/03/2023  Patient will be able to ambulate >/=1000' with RW without needing a rest break to improve functional walking endurance and access to community. Baseline: 120' w/ RW no AFO/toe cap, 800 ft RW; 442' RW and toe cap (4/3) Goal status: NOT MET  2.  Pt will decrease 5xSTS to </=35 seconds w/ BUE support in order to demonstrate decreased risk for falls and improved functional bilateral LE strength and power.  Baseline: 45.25 seconds w/ BUE, 36.78 sec with one UE (3/15); 40.97 seconds w/ BUE support (4/3) Goal status: IN PROGRESS  3.  Pt will demonstrate a gait speed of >/=1.75 feet/sec using LRAD in order to decrease risk for falls. Baseline: 0.51 ft/sec, 1.46 ft/sec (3/15); 1.36 ft/sec w/ RW  (4/3) Goal status: NOT MET   NEW LONG TERM GOALS:  Target date: 01/24/2023  Pt will be independent with final HEP for improved strength, balance, transfers and gait. Baseline:  Goal status: MET  2.  Patient will be able to ambulate >/=1000' with RW without needing a rest break to improve functional walking endurance and access to community. Baseline: 120' w/ RW no AFO/toe cap, 800 ft RW; 442' w/ RW (4/3), 1000 ft with RW (4/23) Goal status: MET  3.  Pt will decrease 5xSTS to </=30 seconds w/ BUE support in order to demonstrate decreased risk for falls and improved functional bilateral LE strength and power.  Baseline: 45.25 seconds w/ BUE, 36.78 sec with one UE (3/15), 27.37 sec with BUE (4/23) Goal status: MET  4.  Pt will demonstrate a gait speed of >/=1.75 feet/sec using LRAD in order to decrease risk for falls. Baseline: 0.51 ft/sec, 1.46 ft/sec (3/15), 1.93 ft/sec (4/23) Goal status: MET    ASSESSMENT:  CLINICAL IMPRESSION: Emphasis of skilled PT session on reassessing LTG in preparation for d/c from PT services this date. Pt has met 4/4 LTG and is independent with his HEP as well as exhibits improved functional LE strength, improved balance with decreased fall risk, and improved ability to navigate community distances and environments. Pt agreeable to d/c from PT this date.    OBJECTIVE IMPAIRMENTS: Abnormal gait, decreased activity tolerance, decreased balance, decreased endurance, decreased mobility, difficulty walking, decreased ROM, decreased strength, improper body mechanics, and postural dysfunction.   ACTIVITY LIMITATIONS: carrying, lifting, bending, squatting, stairs, transfers, and locomotion level  PARTICIPATION LIMITATIONS: meal prep, cleaning, laundry, driving, community activity, and occupation  PERSONAL FACTORS: Age, Fitness, Past/current experiences, Time since onset of injury/illness/exacerbation, Transportation, and 1 comorbidity: ongoing question of  transverse myelitis vs other spinal cord pathology  are also affecting patient's functional outcome.   REHAB POTENTIAL: Good  CLINICAL DECISION MAKING: Evolving/moderate complexity  EVALUATION COMPLEXITY: Moderate     Peter Congo, PT, DPT, CSRS 01/21/2023, 1:52 PM

## 2023-01-24 ENCOUNTER — Encounter: Payer: Self-pay | Admitting: Physical Medicine and Rehabilitation

## 2023-01-24 ENCOUNTER — Encounter
Payer: Medicare Other | Attending: Physical Medicine and Rehabilitation | Admitting: Physical Medicine and Rehabilitation

## 2023-01-24 VITALS — BP 137/80 | HR 60 | Ht 70.0 in | Wt 172.0 lb

## 2023-01-24 DIAGNOSIS — R269 Unspecified abnormalities of gait and mobility: Secondary | ICD-10-CM | POA: Insufficient documentation

## 2023-01-24 DIAGNOSIS — Z029 Encounter for administrative examinations, unspecified: Secondary | ICD-10-CM | POA: Diagnosis present

## 2023-01-24 DIAGNOSIS — R252 Cramp and spasm: Secondary | ICD-10-CM | POA: Diagnosis not present

## 2023-01-24 DIAGNOSIS — G8929 Other chronic pain: Secondary | ICD-10-CM | POA: Insufficient documentation

## 2023-01-24 DIAGNOSIS — Z5181 Encounter for therapeutic drug level monitoring: Secondary | ICD-10-CM | POA: Diagnosis present

## 2023-01-24 DIAGNOSIS — M546 Pain in thoracic spine: Secondary | ICD-10-CM | POA: Diagnosis not present

## 2023-01-24 DIAGNOSIS — G8220 Paraplegia, unspecified: Secondary | ICD-10-CM | POA: Diagnosis not present

## 2023-01-24 MED ORDER — TRAMADOL HCL 50 MG PO TABS: 50.0000 mg | ORAL_TABLET | Freq: Four times a day (QID) | ORAL | 5 refills | Status: DC | PRN

## 2023-01-24 NOTE — Addendum Note (Signed)
Addended by: Janean Sark on: 01/24/2023 03:38 PM   Modules accepted: Orders

## 2023-01-24 NOTE — Progress Notes (Signed)
Subjective:    Patient ID: Maurice Cruz, male    DOB: 1965-10-13, 57 y.o.   MRN: [id number]  HPI Pt is a 57 yr old R handed male with hx of HTN who developed transverse myelitis d'xd in 7/21. With neurogenic bowel and bladder and spasticity-   S/P steroid IV and IVIG- no plasmapheresis. now on meds for BP.  Here for f/u on transverse myelitis/paraplegia. Also has spasticity of RLE and depression Hx of Anal carcinoma in situ- at [facility]- 2021.   Here for f/u on Transverse myelitis.   Was in hospital last year after Thanksgiving- legs started getting weak again- couldn't walk,stand within 5 days.  Had pneumonia and kidney issues.      Been back on track- since was on rehab floor in December.   Just got released from therapy this past week- Feels like close back to baseline- but not quite there.   Will try to get back to try hand controls or driving regularly but hasn't done it yet.   Last f/u 1 month ago- looks good from anal cancer- and if f/u in 1 year, looks good, then will be released.   Urologist- Dr Jacqualine Mau-  Renal- supposed to see- has appt in July since last Cr was 2.2 per PCP.   Cr-  was 2.1- down to 1.9-1.9- was last checked Had physical and sent for Renal to see him    Doing HEP- trying to do daily- to walk as much with RW as possible-  Walks daily pretty much-  usually does 5-10 minutes outside to get moving better.   Pain hasn't gotten more intense- lately-  LLE has more movement, and R less movement and more spasticity.   The last 2 months, LLE- feels weaker than R side.   Last MRI's still showed hypointense signal, however no worsening, 11/23.   Hasn't been using R AFO   Pain Inventory Average Pain 3 Pain Right Now 4 My pain is constant and aching dull  LOCATION OF PAIN  back  BOWEL Number of stools per week: 2-3 Oral laxative use Yes  Type of laxative Miralax Benefiber Enema or suppository use Yes    BLADDER  In and out cath, frequency  4-6 times daily Able to self cath Yes  Difficulty starting stream Yes  Incomplete bladder emptying Yes    Mobility walk with assistance use a walker how many minutes can you walk? 10 mins do you drive?  no use a wheelchair transfers alone Do you have any goals in this area?  yes  Function disabled: date disabled 04/13/2020 I need assistance with the following:  meal prep, household duties, and shopping Do you have any goals in this area?  yes  Neuro/Psych weakness trouble walking spasms confusion  Prior Studies Any changes since last visit?  no  Physicians involved in your care Any changes since last visit?  no   Family History  Problem Relation Age of Onset   Cancer Maternal Grandmother    Social History   Socioeconomic History   Marital status: Single    Spouse name: Not on file   Number of children: 0   Years of education: HS   Highest education level: Not on file  Occupational History   Occupation: On disability  Tobacco Use   Smoking status: Never   Smokeless tobacco: Never  Vaping Use   Vaping Use: Never used  Substance and Sexual Activity   Alcohol use: Not Currently   Drug use:  Never   Sexual activity: Not Currently  Other Topics Concern   Not on file  Social History Narrative   Right handed   1 glass tea per day   Coffee sometimes    No Soda   Lives with parents.   Social Determinants of Health   Financial Resource Strain: Not on file  Food Insecurity: No Food Insecurity (08/26/2022)   Hunger Vital Sign    Worried About Running Out of Food in the Last Year: Never true    Ran Out of Food in the Last Year: Never true  Transportation Needs: No Transportation Needs (08/26/2022)   PRAPARE - Administrator, Civil Service (Medical): No    Lack of Transportation (Non-Medical): No  Physical Activity: Not on file  Stress: Not on file  Social Connections: Not on file   Past Surgical History:  Procedure Laterality Date    Anorectal biospy  02/09/2021   Colon Biospy  02/09/2021   IR ANGIO INTRA EXTRACRAN SEL COM CAROTID INNOMINATE BILAT MOD SED  04/25/2021   IR ANGIO VERTEBRAL SEL SUBCLAVIAN INNOMINATE UNI L MOD SED  04/25/2021   IR ANGIO VERTEBRAL SEL VERTEBRAL UNI R MOD SED  04/25/2021   IR ANGIO/SPINAL LEFT  04/25/2021   IR ANGIO/SPINAL LEFT  04/25/2021   IR ANGIO/SPINAL LEFT  04/25/2021   IR ANGIO/SPINAL LEFT  04/25/2021   IR ANGIO/SPINAL LEFT  04/25/2021   IR ANGIO/SPINAL LEFT  04/25/2021   IR ANGIO/SPINAL LEFT  04/25/2021   IR ANGIO/SPINAL LEFT  04/25/2021   IR ANGIO/SPINAL LEFT  04/25/2021   IR ANGIO/SPINAL LEFT  04/25/2021   IR ANGIO/SPINAL LEFT  04/25/2021   IR ANGIO/SPINAL LEFT  04/25/2021   IR ANGIO/SPINAL LEFT  04/25/2021   IR ANGIO/SPINAL RIGHT  04/25/2021   IR ANGIO/SPINAL RIGHT  04/25/2021   IR ANGIO/SPINAL RIGHT  04/25/2021   IR ANGIO/SPINAL RIGHT  04/25/2021   IR ANGIO/SPINAL RIGHT  04/25/2021   IR ANGIO/SPINAL RIGHT  04/25/2021   IR ANGIO/SPINAL RIGHT  04/25/2021   IR ANGIO/SPINAL RIGHT  04/25/2021   IR ANGIO/SPINAL RIGHT  04/25/2021   IR ANGIO/SPINAL RIGHT  04/25/2021   IR ANGIO/SPINAL RIGHT  04/25/2021   IR ANGIO/SPINAL RIGHT  04/25/2021   IR ANGIO/SPINAL RIGHT  04/25/2021   IR ANGIO/SPINAL RIGHT  04/25/2021   IR ANGIOGRAM EXTREMITY BILATERAL  04/25/2021   IR RADIOLOGIST EVAL & MGMT  03/21/2021   RADIOLOGY WITH ANESTHESIA N/A 04/25/2021   Procedure: IR WITH ANESTHESIA SPINAL ANGIOGRAM;  Surgeon: Julieanne Cotton, MD;  Location: [facility] OR;  Service: Radiology;  Laterality: N/A;   Past Medical History:  Diagnosis Date   Anxiety    Asthma    as a child   Depression    ED (erectile dysfunction)    GERD (gastroesophageal reflux disease)    Hx of spinal cord injury 03/2020   Hypertension    Neurogenic bladder    self caths   Neurogenic bowel    has to be digitially stimulated - 04/24/21   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HCC)    bilateral legs   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HCC)    Ht 5\' 10"  (1.778 m)   Wt 172 lb (78 kg)   BMI  24.68 kg/m   Opioid Risk Score:   Fall Risk Score:  `1  Depression screen [facility] 2/9     05/24/2022    2:39 PM 02/04/2022    2:32 PM 10/26/2021    1:39 PM 12/08/2020   10:17 AM  Depression screen PHQ  2/9  Decreased Interest 1 0 0 3  Down, Depressed, Hopeless 1 0 0 3  PHQ - 2 Score 2 0 0 6  Altered sleeping    1  Tired, decreased energy    1  Change in appetite    2  Feeling bad or failure about yourself     3  Trouble concentrating    0  Moving slowly or fidgety/restless    0  Suicidal thoughts    0  PHQ-9 Score    13  Difficult doing work/chores    Very difficult    Review of Systems  Gastrointestinal:  Positive for abdominal pain and constipation.  Musculoskeletal:  Positive for back pain and gait problem.       Spasms  Neurological:  Positive for weakness.  Psychiatric/Behavioral:         Depression  All other systems reviewed and are negative.      Objective:   Physical Exam  Awake, alert, appropriate, using RW to get around, NAD  RLE- HF 2-/5; KE 4/5; DF and PF 4+/5 LLE- HF  4-/5; KE 5-/5 and DF/PF 5/5   Neuro: MAS of 1+ in LE's with 3-4 beats clonus on RLE and LLE     Assessment & Plan:   Pt is a 56 yr old R handed male with hx of HTN who developed transverse myelitis d'xd in 7/21. With neurogenic bowel and bladder and spasticity-   S/P steroid IV and IVIG- no plasmapheresis. now on meds for BP.  Here for f/u on transverse myelitis/paraplegia. Also has spasticity of RLE and depression   SCI support group- [id number]- 6-7 pm- last Thursday of the month-   2.  Hasn't gotten into CAP, but let him know that if moves out of current situation, and need some assistance- then be happy to fill out paperwork for CAP. Have paperwork sent to me, not PCP.    3. Con't Zanaflex and Valium- doesn't need refills   4. Tramadol-  50 mg q6 hours prn #90- 5 refills   5. Oral drug screen- since has been a long time since seen.    6. F/U in 4 months- double  appt- SCI   I spent a total of  31  minutes on total care today- >50% coordination of care- due to discussion about options for pain; increased dosage/amount; and discussion about back in hospital since last seen- and what occurred; also about SCI support group.

## 2023-01-24 NOTE — Patient Instructions (Signed)
Pt is a 57 yr old R handed male with hx of HTN who developed transverse myelitis d'xd in 7/21. With neurogenic bowel and bladder and spasticity-   S/P steroid IV and IVIG- no plasmapheresis. now on meds for BP.  Here for f/u on transverse myelitis/paraplegia. Also has spasticity of RLE and depression   [facility]I support group- [id number]- 6-7 pm- last Thursday of the month-   2.  Hasn't gotten into CAP, but let him know that if moves out of current situation, and need some assistance- then be happy to fill out paperwork for CAP. Have paperwork sent to me, not PCP.    3. Con't Zanaflex and Valium- doesn't need refills   4. Tramadol-  50 mg q6 hours prn #90- 5 refills   5. Oral drug screen- since has been a long time since seen.    6. F/U in 4 months- double appt- SCI

## 2023-01-28 LAB — DRUG TOX MONITOR 1 W/CONF, ORAL FLD
Amphetamines: NEGATIVE ng/mL (ref <10)
Barbiturates: NEGATIVE ng/mL (ref <10)
Benzodiazepines: NEGATIVE ng/mL (ref <0.50)
Buprenorphine: NEGATIVE ng/mL (ref <0.10)
Cocaine: NEGATIVE ng/mL (ref <5.0)
Fentanyl: NEGATIVE ng/mL (ref <0.10)
Heroin Metabolite: NEGATIVE ng/mL (ref <1.0)
MARIJUANA: NEGATIVE ng/mL (ref <2.5)
MDMA: NEGATIVE ng/mL (ref <10)
Meprobamate: NEGATIVE ng/mL (ref <2.5)
Methadone: NEGATIVE ng/mL (ref <5.0)
Nicotine Metabolite: NEGATIVE ng/mL (ref <5.0)
Opiates: NEGATIVE ng/mL (ref <2.5)
Phencyclidine: NEGATIVE ng/mL (ref <10)
Tapentadol: NEGATIVE ng/mL (ref <5.0)
Tramadol: 7.7 ng/mL — ABNORMAL HIGH (ref <5.0)
Tramadol: POSITIVE ng/mL — AB (ref <5.0)
Zolpidem: NEGATIVE ng/mL (ref <5.0)

## 2023-01-28 LAB — DRUG TOX ALC METAB W/CON, ORAL FLD: Alcohol Metabolite: NEGATIVE ng/mL (ref <25)

## 2023-02-19 ENCOUNTER — Telehealth: Payer: Self-pay | Admitting: *Deleted

## 2023-02-19 NOTE — Telephone Encounter (Signed)
Oral swab drug screen was consistent for prescribed medications.  ?

## 2023-04-16 ENCOUNTER — Other Ambulatory Visit: Payer: Self-pay | Admitting: Physical Medicine and Rehabilitation

## 2023-04-16 NOTE — Telephone Encounter (Signed)
Next f/u 05/26/23

## 2023-05-26 ENCOUNTER — Encounter
Payer: Medicare Other | Attending: Physical Medicine and Rehabilitation | Admitting: Physical Medicine and Rehabilitation

## 2023-05-26 ENCOUNTER — Encounter: Payer: Self-pay | Admitting: Physical Medicine and Rehabilitation

## 2023-05-26 VITALS — BP 126/77 | HR 53 | Wt <= 1120 oz

## 2023-05-26 DIAGNOSIS — R252 Cramp and spasm: Secondary | ICD-10-CM | POA: Insufficient documentation

## 2023-05-26 DIAGNOSIS — M546 Pain in thoracic spine: Secondary | ICD-10-CM | POA: Insufficient documentation

## 2023-05-26 DIAGNOSIS — R269 Unspecified abnormalities of gait and mobility: Secondary | ICD-10-CM | POA: Diagnosis present

## 2023-05-26 DIAGNOSIS — G8220 Paraplegia, unspecified: Secondary | ICD-10-CM | POA: Insufficient documentation

## 2023-05-26 DIAGNOSIS — G8929 Other chronic pain: Secondary | ICD-10-CM | POA: Insufficient documentation

## 2023-05-26 MED ORDER — TIZANIDINE HCL 4 MG PO TABS: 4.0000 mg | ORAL_TABLET | Freq: Two times a day (BID) | ORAL | 5 refills | Status: DC | PRN

## 2023-05-26 MED ORDER — TRAMADOL HCL 50 MG PO TABS: 50.0000 mg | ORAL_TABLET | Freq: Four times a day (QID) | ORAL | 5 refills | Status: DC | PRN

## 2023-05-26 NOTE — Patient Instructions (Signed)
Pt is a 57 yr old R handed male with hx of HTN who developed transverse myelitis d'xd in 7/21. With neurogenic bowel and bladder and spasticity-   S/P steroid IV and IVIG- no plasmapheresis. now on meds for BP.  Here for f/u on transverse myelitis/paraplegia. T10 incomplete paraplegia.  Also has spasticity of RLE and depression As seen renal lately- nothing majorly wrong, does have kidney disease- Cr 1.59 now.    Con't Tramadol 50 mg up to 3x/day as needed  #90- 5 refills- is due for refill.    2. Con't Valium/Diazepam 2 mg 2x/day as needed- has refilled- last filled 04/16/23. Has 1 refill on it.    3. Con't Zanaflex  2-4 mg 2x/day as needed #60-  5 refills.    4.  Discussed intimacy- retrograde ejaculation.   Has Sidenafil, viagra- , vs Cialis- vs Levitra- discussed options.    5. Not interested in SCI support group.    6.  Much higher risk of fracture- due to increased risk of osteoporosis.   Discussed at length.   7. F/u- 4 months- double SCI

## 2023-05-26 NOTE — Progress Notes (Signed)
Subjective:    Patient ID: Maurice Cruz, male    DOB: [birth year], 57 y.o.   MRN: [id number]  HPI  Pt is a 57 yr old R handed male with hx of HTN who developed transverse myelitis d'xd in 7/21. With neurogenic bowel and bladder and spasticity-   S/P steroid IV and IVIG- no plasmapheresis. now on meds for BP.  Here for f/u on transverse myelitis/paraplegia. Also has spasticity of RLE and depression   Things about the same overall.    Tramadol doing OK.  Taking it 3-4x/week- 1x/day sometimes feels like needs more, but, but just takes one.  Knows written so can take 3 tabs/day, but doesn't want to take more.     Spasticity about the same-   No issues on living situation- moving out still in the works.    Has medicare- but not medicaid.    Patient has "downer days" when sees people walking well or not able to do as much.    Takes diazepam 3-4x week. 1x/day as needed  Never takes 2x/day- makes him sleepy.  Makes "enough sleepy".   The last week started taking Zanaflex 4 mg every day 1x/day- at night.     Pain Inventory Average Pain 4 Pain Right Now 3 My pain is intermittent, dull, and aching  LOCATION OF PAIN  back, legs  BOWEL Number of stools per week: 2-3 Oral laxative use Yes  Type of laxative Miralax & Fiber Enema or suppository use No  History of colostomy No   BLADDER  In and out cath, frequency 5 6 times daily Able to self cath Yes  Difficulty starting stream Yes  Incomplete bladder emptying Yes    Mobility walk with assistance how many minutes can you walk? 5 minutes ability to climb steps?  no do you drive?  no use a wheelchair transfers alone Do you have any goals in this area?  yes  Function disabled: date disabled 03/2023 I need assistance with the following:  meal prep, household duties, and shopping Do you have any goals in this area?  yes  Neuro/Psych bladder control problems bowel control problems weakness trouble  walking spasms depression anxiety  Prior Studies Any changes since last visit?  no  Physicians involved in your care Any changes since last visit?  no   Family History  Problem Relation Age of Onset   Cancer Maternal Grandmother    Social History   Socioeconomic History   Marital status: Single    Spouse name: Not on file   Number of children: 0   Years of education: HS   Highest education level: Not on file  Occupational History   Occupation: On disability  Tobacco Use   Smoking status: Never   Smokeless tobacco: Never  Vaping Use   Vaping status: Never Used  Substance and Sexual Activity   Alcohol use: Not Currently   Drug use: Never   Sexual activity: Not Currently  Other Topics Concern   Not on file  Social History Narrative   Right handed   1 glass tea per day   Coffee sometimes    No Soda   Lives with parents.   Social Determinants of Health   Financial Resource Strain: Low Risk  (12/11/2022)   [facility]   Overall Financial Resource Strain (CARDIA)    Difficulty of Paying Living Expenses: Not very hard  Food Insecurity: No Food Insecurity (12/11/2022)   [facility]   Hunger Vital Sign    Worried About Running Out of Food in the Last Year: Never true    Ran Out of Food in the Last Year: Never true  Transportation Needs: No Transportation Needs (12/11/2022)   Received from [facility], [facility]   PRAPARE - Transportation    Lack of Transportation (Medical): No    Lack of Transportation (Non-Medical): No  Physical Activity: Unknown (12/11/2022)   Received from [facility]   Exercise Vital Sign    Days of Exercise per Week: 0 days    Minutes of Exercise per Session: Not on file  Stress: No Stress Concern Present (12/11/2022)   Received from [facility], [facility] - Occupational Stress Questionnaire    Feeling of  Stress : Only a little  Social Connections: Somewhat Isolated (12/11/2022)   Received from [facility]   Social Network    How would you rate your social network (family, work, friends)?: Restricted participation with some degree of social isolation   Past Surgical History:  Procedure Laterality Date   Anorectal biospy  02/09/2021   Colon Biospy  02/09/2021   IR ANGIO INTRA EXTRACRAN SEL COM CAROTID INNOMINATE BILAT MOD SED  04/25/2021   IR ANGIO VERTEBRAL SEL SUBCLAVIAN INNOMINATE UNI L MOD SED  04/25/2021   IR ANGIO VERTEBRAL SEL VERTEBRAL UNI R MOD SED  04/25/2021   IR ANGIO/SPINAL LEFT  04/25/2021   IR ANGIO/SPINAL LEFT  04/25/2021   IR ANGIO/SPINAL LEFT  04/25/2021   IR ANGIO/SPINAL LEFT  04/25/2021   IR ANGIO/SPINAL LEFT  04/25/2021   IR ANGIO/SPINAL LEFT  04/25/2021   IR ANGIO/SPINAL LEFT  04/25/2021   IR ANGIO/SPINAL LEFT  04/25/2021   IR ANGIO/SPINAL LEFT  04/25/2021   IR ANGIO/SPINAL LEFT  04/25/2021   IR ANGIO/SPINAL LEFT  04/25/2021   IR ANGIO/SPINAL LEFT  04/25/2021   IR ANGIO/SPINAL LEFT  04/25/2021   IR ANGIO/SPINAL RIGHT  04/25/2021   IR ANGIO/SPINAL RIGHT  04/25/2021   IR ANGIO/SPINAL RIGHT  04/25/2021   IR ANGIO/SPINAL RIGHT  04/25/2021   IR ANGIO/SPINAL RIGHT  04/25/2021   IR ANGIO/SPINAL RIGHT  04/25/2021   IR ANGIO/SPINAL RIGHT  04/25/2021   IR ANGIO/SPINAL RIGHT  04/25/2021   IR ANGIO/SPINAL RIGHT  04/25/2021   IR ANGIO/SPINAL RIGHT  04/25/2021   IR ANGIO/SPINAL RIGHT  04/25/2021   IR ANGIO/SPINAL RIGHT  04/25/2021   IR ANGIO/SPINAL RIGHT  04/25/2021   IR ANGIO/SPINAL RIGHT  04/25/2021   IR ANGIOGRAM EXTREMITY BILATERAL  04/25/2021   IR RADIOLOGIST EVAL & MGMT  03/21/2021   RADIOLOGY WITH ANESTHESIA N/A 04/25/2021   Procedure: IR WITH ANESTHESIA SPINAL ANGIOGRAM;  Surgeon: Julieanne Cotton, MD;  Location: [facility] OR;  Service: Radiology;  Laterality: N/A;   Past Medical History:  Diagnosis Date   Anxiety    Asthma    as a child   Depression    ED (erectile  dysfunction)    GERD (gastroesophageal reflux disease)    Hx of spinal cord injury 03/2020   Hypertension    Neurogenic bladder    self caths   Neurogenic bowel    has to be digitially stimulated - 04/24/21   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HCC)    bilateral legs   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HCC)    Wt 17 lb (7.711 kg)   BMI 2.44 kg/m   Opioid Risk Score:   Fall Risk Score:  `1  Depression screen  PHQ 2/9     05/24/2022    2:39 PM 02/04/2022    2:32 PM 10/26/2021    1:39 PM 12/08/2020   10:17 AM  Depression screen PHQ 2/9  Decreased Interest 1 0 0 3  Down, Depressed, Hopeless 1 0 0 3  PHQ - 2 Score 2 0 0 6  Altered sleeping    1  Tired, decreased energy    1  Change in appetite    2  Feeling bad or failure about yourself     3  Trouble concentrating    0  Moving slowly or fidgety/restless    0  Suicidal thoughts    0  PHQ-9 Score    13  Difficult doing work/chores    Very difficult    Review of Systems  Gastrointestinal:  Positive for constipation.  Genitourinary:  Positive for difficulty urinating.  Musculoskeletal:  Positive for gait problem.       Spasms  Neurological:  Positive for weakness.  Psychiatric/Behavioral:         Depression, anxiety  All other systems reviewed and are negative.      Objective:   Physical Exam        Assessment & Plan:   Pt is a 57 yr old R handed male with hx of HTN who developed transverse myelitis d'xd in 7/21. With neurogenic bowel and bladder and spasticity-   S/P steroid IV and IVIG- no plasmapheresis. now on meds for BP.  Here for f/u on transverse myelitis/paraplegia. T10 incomplete paraplegia.  Also has spasticity of RLE and depression As seen renal lately- nothing majorly wrong, does have kidney disease- Cr 1.59 now.    Con't Tramadol 50 mg up to 3x/day as needed  #90- 5 refills- is due for refill.    2. Con't Valium/Diazepam 2 mg 2x/day as needed- has refilled- last filled 04/16/23. Has 1 refill on it.    3. Con't Zanaflex  2-4 mg  2x/day as needed #60-  5 refills.    4.  Discussed intimacy- retrograde ejaculation.   Has Sidenafil, viagra- , vs Cialis- vs Levitra- discussed options.    5. Not interested in SCI support group.    6.  Much higher risk of fracture- due to increased risk of osteoporosis.   Discussed at length.   7. F/u- 4 months- double SCI    I spent a total of 31   minutes on total care today- >50% coordination of care- due to  intimacy, osteoporosis; and intimacy

## 2023-09-19 ENCOUNTER — Ambulatory Visit: Payer: Medicare Other | Admitting: Physical Medicine and Rehabilitation

## 2023-09-26 ENCOUNTER — Ambulatory Visit: Payer: Medicare Other | Admitting: Physical Medicine and Rehabilitation

## 2023-10-06 ENCOUNTER — Encounter
Payer: Medicare Other | Attending: Physical Medicine and Rehabilitation | Admitting: Physical Medicine and Rehabilitation

## 2023-10-06 ENCOUNTER — Encounter: Payer: Self-pay | Admitting: Physical Medicine and Rehabilitation

## 2023-10-06 VITALS — BP 138/73 | HR 55 | Ht 70.0 in | Wt 182.0 lb

## 2023-10-06 DIAGNOSIS — R29898 Other symptoms and signs involving the musculoskeletal system: Secondary | ICD-10-CM | POA: Diagnosis not present

## 2023-10-06 DIAGNOSIS — R252 Cramp and spasm: Secondary | ICD-10-CM | POA: Insufficient documentation

## 2023-10-06 DIAGNOSIS — R269 Unspecified abnormalities of gait and mobility: Secondary | ICD-10-CM | POA: Diagnosis present

## 2023-10-06 DIAGNOSIS — G8220 Paraplegia, unspecified: Secondary | ICD-10-CM | POA: Insufficient documentation

## 2023-10-06 DIAGNOSIS — N528 Other male erectile dysfunction: Secondary | ICD-10-CM | POA: Diagnosis present

## 2023-10-06 MED ORDER — DIAZEPAM 2 MG PO TABS: 2.0000 mg | ORAL_TABLET | Freq: Two times a day (BID) | ORAL | 5 refills | Status: DC | PRN

## 2023-10-06 MED ORDER — TRAMADOL HCL 50 MG PO TABS: 50.0000 mg | ORAL_TABLET | Freq: Four times a day (QID) | ORAL | 5 refills | Status: DC | PRN

## 2023-10-06 MED ORDER — SILDENAFIL CITRATE 100 MG PO TABS: 100.0000 mg | ORAL_TABLET | ORAL | 1 refills | Status: AC | PRN

## 2023-10-06 MED ORDER — TIZANIDINE HCL 4 MG PO TABS: 4.0000 mg | ORAL_TABLET | Freq: Two times a day (BID) | ORAL | 5 refills | Status: DC | PRN

## 2023-10-06 NOTE — Patient Instructions (Signed)
 Pt is a 58 yr old R handed male with hx of HTN who developed transverse myelitis d'xd in 7/21. With neurogenic bowel and bladder and spasticity-   S/P steroid IV and IVIG- no plasmapheresis. now on meds for BP.  Here for f/u on transverse myelitis/paraplegia. Also has spasticity of RLE and depression   Try Zanaflex  /Tizanidine  2 mg/ 1/2 tab in AM and then do regular dosing at night. Might not need to do long term, but at least until back feeling better. Up to 2x/day #60- 5 refills  2.  Running short of catheters-5- 6x/day- so 180 catheters- if need be, can sterilize with hot water- call Urology- to get amount upgraded to 6 caths/day- so 180 month.    3. Will refill Tramadol  50 mg 3x/day as needed #90- with 5 refills.    4. Will Refill Valium  2 mg BID as needed #60- 5 refills- for spasticity.    5. Will restart Viagra  100 mg daily prn # 90-with 1 refill.    6. Can put Good Rx app on phone   7. Needs to get PCP in [place]- and calling Dr Katherleen about you, in [place]- as a rehab doc.    8.  Toe cap on R shoe- needs Rx- will give- call [name]- called and spoke ot [place]- needs Rx.    9. F/U in 4 months- double appt-

## 2023-10-06 NOTE — Progress Notes (Signed)
 Subjective:    Patient ID: Maurice Cruz, male    DOB: 1966-02-17, 58 y.o.   MRN: [id number]  HPI  Pt is a 58 yr old R handed male with hx of HTN who developed transverse myelitis d'xd in 7/21. With neurogenic bowel and bladder and spasticity-   S/P steroid IV and IVIG- no plasmapheresis. now on meds for BP.  Here for f/u on transverse myelitis/paraplegia. Also has spasticity of RLE and depression  Has moved back to [place]- wanting to get care down in [place].  Better than living with parents.  Still worried about parents- they are 42 yrs old- going to move to smaller home. Without stairs.   In handicapped unit[place].   Spasticity about the same.  Taking Valium  nightly- almost every night for him.  Zanaflex - increased to 4 mg- so takes 2-4 mg nightly-  Spasticity mainly at night-  and early in AM.    Taking tramadol - takes 1 tab daily-  doesn't feel like 50 mg does ENOUGH anymore.  Can feel some improvement- usually 1-2x/day- no more than 2 tabs/day.    Has 2 falls- feet got crossed up  Didn't get hurt.  2nd time chair moved  Pain in back last week when went to grab something off floor and had SEVERE sharp pain on R low back.  Is doing better- 3/10 now- or if sits too long and has to get up, it's agony- gets up to 7-8/10-  over 60 minutes is too long.  More stiff/tight since hurt back last week, but has always been there.   Toe capT on Shoe  on R- is wearing off- needs a new one.   Caths 6x/day   Now using lidoderm  patches-    Pain Inventory5 Average Pain  Pain Right Now 4 My pain is sharp and aching  In the last 24 hours, has pain interfered with the following? General activity 0 Relation with others 0 Enjoyment of life 0 What TIME of day is your pain at its worst? varies Sleep (in general) Fair  Pain is worse with: walking, sitting, standing, and some activites Pain improves with: rest, pacing activities, and medication Relief from Meds:  5  Family History  Problem Relation Age of Onset   Cancer Maternal Grandmother    Social History   Socioeconomic History   Marital status: Single    Spouse name: Not on file   Number of children: 0   Years of education: HS   Highest education level: Not on file  Occupational History   Occupation: On disability  Tobacco Use   Smoking status: Never   Smokeless tobacco: Never  Vaping Use   Vaping status: Never Used  Substance and Sexual Activity   Alcohol use: Not Currently   Drug use: Never   Sexual activity: Not Currently  Other Topics Concern   Not on file  Social History Narrative   Right handed   1 glass tea per day   Coffee sometimes    No Soda   Lives with parents.   Social Drivers of Corporate Investment Banker Strain: Low Risk  (12/11/2022)   Received from [facility], [facility]   Overall Financial Resource Strain (CARDIA)    Difficulty of Paying Living Expenses: Not very hard  Food Insecurity: No Food Insecurity (12/11/2022)   Received from [facility]   Hunger Vital Sign    Worried About Running Out of Food in the Last Year: Never  true    Ran Out of Food in the Last Year: Never true  Transportation Needs: No Transportation Needs (12/11/2022)   Received from [facility], [facility]   PRAPARE - Transportation    Lack of Transportation (Medical): No    Lack of Transportation (Non-Medical): No  Physical Activity: Unknown (12/11/2022)   Received from [facility]   Exercise Vital Sign    Days of Exercise per Week: 0 days    Minutes of Exercise per Session: Not on file  Stress: No Stress Concern Present (12/11/2022)   Received from [facility], [facility] of Occupational Health - Occupational Stress Questionnaire    Feeling of Stress : Only a little  Social Connections: Somewhat Isolated (12/11/2022)   Received from [facility], [facility]   Social Network    How would you rate your  social network (family, work, friends)?: Restricted participation with some degree of social isolation   Past Surgical History:  Procedure Laterality Date   Anorectal biospy  02/09/2021   Colon Biospy  02/09/2021   IR ANGIO INTRA EXTRACRAN SEL COM CAROTID INNOMINATE BILAT MOD SED  04/25/2021   IR ANGIO VERTEBRAL SEL SUBCLAVIAN INNOMINATE UNI L MOD SED  04/25/2021   IR ANGIO VERTEBRAL SEL VERTEBRAL UNI R MOD SED  04/25/2021   IR ANGIO/SPINAL LEFT  04/25/2021   IR ANGIO/SPINAL LEFT  04/25/2021   IR ANGIO/SPINAL LEFT  04/25/2021   IR ANGIO/SPINAL LEFT  04/25/2021   IR ANGIO/SPINAL LEFT  04/25/2021   IR ANGIO/SPINAL LEFT  04/25/2021   IR ANGIO/SPINAL LEFT  04/25/2021   IR ANGIO/SPINAL LEFT  04/25/2021   IR ANGIO/SPINAL LEFT  04/25/2021   IR ANGIO/SPINAL LEFT  04/25/2021   IR ANGIO/SPINAL LEFT  04/25/2021   IR ANGIO/SPINAL LEFT  04/25/2021   IR ANGIO/SPINAL LEFT  04/25/2021   IR ANGIO/SPINAL RIGHT  04/25/2021   IR ANGIO/SPINAL RIGHT  04/25/2021   IR ANGIO/SPINAL RIGHT  04/25/2021   IR ANGIO/SPINAL RIGHT  04/25/2021   IR ANGIO/SPINAL RIGHT  04/25/2021   IR ANGIO/SPINAL RIGHT  04/25/2021   IR ANGIO/SPINAL RIGHT  04/25/2021   IR ANGIO/SPINAL RIGHT  04/25/2021   IR ANGIO/SPINAL RIGHT  04/25/2021   IR ANGIO/SPINAL RIGHT  04/25/2021   IR ANGIO/SPINAL RIGHT  04/25/2021   IR ANGIO/SPINAL RIGHT  04/25/2021   IR ANGIO/SPINAL RIGHT  04/25/2021   IR ANGIO/SPINAL RIGHT  04/25/2021   IR ANGIOGRAM EXTREMITY BILATERAL  04/25/2021   IR RADIOLOGIST EVAL & MGMT  03/21/2021   RADIOLOGY WITH ANESTHESIA N/A 04/25/2021   Procedure: IR WITH ANESTHESIA SPINAL ANGIOGRAM;  Surgeon: Dolphus Carrion, MD;  Location: [facility] OR;  Service: Radiology;  Laterality: N/A;   Past Surgical History:  Procedure Laterality Date   Anorectal biospy  02/09/2021   Colon Biospy  02/09/2021   IR ANGIO INTRA EXTRACRAN SEL COM CAROTID INNOMINATE BILAT MOD SED  04/25/2021   IR ANGIO VERTEBRAL SEL SUBCLAVIAN INNOMINATE UNI L MOD SED  04/25/2021   IR ANGIO  VERTEBRAL SEL VERTEBRAL UNI R MOD SED  04/25/2021   IR ANGIO/SPINAL LEFT  04/25/2021   IR ANGIO/SPINAL LEFT  04/25/2021   IR ANGIO/SPINAL LEFT  04/25/2021   IR ANGIO/SPINAL LEFT  04/25/2021   IR ANGIO/SPINAL LEFT  04/25/2021   IR ANGIO/SPINAL LEFT  04/25/2021   IR ANGIO/SPINAL LEFT  04/25/2021   IR ANGIO/SPINAL LEFT  04/25/2021   IR ANGIO/SPINAL LEFT  04/25/2021   IR ANGIO/SPINAL LEFT  04/25/2021  IR ANGIO/SPINAL LEFT  04/25/2021   IR ANGIO/SPINAL LEFT  04/25/2021   IR ANGIO/SPINAL LEFT  04/25/2021   IR ANGIO/SPINAL RIGHT  04/25/2021   IR ANGIO/SPINAL RIGHT  04/25/2021   IR ANGIO/SPINAL RIGHT  04/25/2021   IR ANGIO/SPINAL RIGHT  04/25/2021   IR ANGIO/SPINAL RIGHT  04/25/2021   IR ANGIO/SPINAL RIGHT  04/25/2021   IR ANGIO/SPINAL RIGHT  04/25/2021   IR ANGIO/SPINAL RIGHT  04/25/2021   IR ANGIO/SPINAL RIGHT  04/25/2021   IR ANGIO/SPINAL RIGHT  04/25/2021   IR ANGIO/SPINAL RIGHT  04/25/2021   IR ANGIO/SPINAL RIGHT  04/25/2021   IR ANGIO/SPINAL RIGHT  04/25/2021   IR ANGIO/SPINAL RIGHT  04/25/2021   IR ANGIOGRAM EXTREMITY BILATERAL  04/25/2021   IR RADIOLOGIST EVAL & MGMT  03/21/2021   RADIOLOGY WITH ANESTHESIA N/A 04/25/2021   Procedure: IR WITH ANESTHESIA SPINAL ANGIOGRAM;  Surgeon: Dolphus Carrion, MD;  Location: [facility] OR;  Service: Radiology;  Laterality: N/A;   Past Medical History:  Diagnosis Date   Anxiety    Asthma    as a child   Depression    ED (erectile dysfunction)    GERD (gastroesophageal reflux disease)    Hx of spinal cord injury 03/2020   Hypertension    Neurogenic bladder    self caths   Neurogenic bowel    has to be digitially stimulated - 04/24/21   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HCC)    bilateral legs   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HCC)    BP 138/73   Pulse (!) 55   Ht 5' 10 (1.778 m)   Wt 182 lb (82.6 kg)   SpO2 91%   BMI 26.11 kg/m   Opioid Risk Score:   Fall Risk Score:  `1  Depression screen PHQ 2/9     05/26/2023    1:12 PM 05/24/2022    2:39 PM 02/04/2022    2:32 PM 10/26/2021    1:39  PM 12/08/2020   10:17 AM  Depression screen PHQ 2/9  Decreased Interest 0 1 0 0 3  Down, Depressed, Hopeless 0 1 0 0 3  PHQ - 2 Score 0 2 0 0 6  Altered sleeping     1  Tired, decreased energy     1  Change in appetite     2  Feeling bad or failure about yourself      3  Trouble concentrating     0  Moving slowly or fidgety/restless     0  Suicidal thoughts     0  PHQ-9 Score     13  Difficult doing work/chores     Very difficult    Review of Systems  Musculoskeletal:  Positive for back pain and gait problem.  Neurological:  Positive for tremors and weakness.  All other systems reviewed and are negative.     Objective:   Physical Exam  Awake, alert, appropriate, using RW to walk, NAD  Neuro: MAS of 1+ in LLE-  MAS of 2 in RLE      Assessment & Plan:   Pt is a 58 yr old R handed male with hx of HTN who developed transverse myelitis d'xd in 7/21. With neurogenic bowel and bladder and spasticity-   S/P steroid IV and IVIG- no plasmapheresis. now on meds for BP.  Here for f/u on transverse myelitis/paraplegia. Also has spasticity of RLE and depression   Try Zanaflex  /Tizanidine  2 mg/ 1/2 tab in AM and then do regular dosing at night. Might not need to  do long term, but at least until back feeling better. Up to 2x/day #60- 5 refills  2.  Running short of catheters-5- 6x/day- so 180 catheters- if need be, can sterilize with hot water- call Urology- to get amount upgraded to 6 caths/day- so 180 month.    3. Will refill Tramadol  50 mg 3x/day as needed #90- with 5 refills.    4. Will Refill Valium  2 mg BID as needed #60- 5 refills- for spasticity.    5. Will restart Viagra  100 mg daily prn # 90-with 1 refill.    6. Can put Good Rx app on phone   7. Needs to get PCP in [place]- and calling Dr Katherleen about you, in [place]- as a rehab doc.    8.  Toe cap on R shoe- needs Rx- will give- call [name]- called and spoke to [place]- needs Rx.    9. F/U in 4 months- double  appt-    I spent a total of 41   minutes on total care today- >50% coordination of care- due to discussion about moving- called [facility] doctor- trying to get in; also called [facility] to see if needed Rx. And went over increasing meds.

## 2023-10-13 ENCOUNTER — Encounter: Payer: Self-pay | Admitting: Physical Medicine and Rehabilitation

## 2024-02-02 ENCOUNTER — Encounter: Payer: Self-pay | Admitting: Physical Medicine and Rehabilitation

## 2024-02-02 ENCOUNTER — Encounter
Payer: Medicare Other | Attending: Physical Medicine and Rehabilitation | Admitting: Physical Medicine and Rehabilitation

## 2024-02-02 VITALS — BP 144/88 | HR 67 | Ht 70.0 in | Wt 183.0 lb

## 2024-02-02 DIAGNOSIS — Z5181 Encounter for therapeutic drug level monitoring: Secondary | ICD-10-CM | POA: Insufficient documentation

## 2024-02-02 DIAGNOSIS — M10371 Gout due to renal impairment, right ankle and foot: Secondary | ICD-10-CM | POA: Insufficient documentation

## 2024-02-02 DIAGNOSIS — G8220 Paraplegia, unspecified: Secondary | ICD-10-CM | POA: Insufficient documentation

## 2024-02-02 DIAGNOSIS — G894 Chronic pain syndrome: Secondary | ICD-10-CM | POA: Diagnosis present

## 2024-02-02 DIAGNOSIS — R252 Cramp and spasm: Secondary | ICD-10-CM | POA: Diagnosis present

## 2024-02-02 DIAGNOSIS — R269 Unspecified abnormalities of gait and mobility: Secondary | ICD-10-CM | POA: Insufficient documentation

## 2024-02-02 DIAGNOSIS — Z029 Encounter for administrative examinations, unspecified: Secondary | ICD-10-CM | POA: Insufficient documentation

## 2024-02-02 MED ORDER — DIAZEPAM 2 MG PO TABS: 2.0000 mg | ORAL_TABLET | Freq: Two times a day (BID) | ORAL | 5 refills | Status: DC | PRN

## 2024-02-02 MED ORDER — PREDNISONE 20 MG PO TABS: 20.0000 mg | ORAL_TABLET | Freq: Every day | ORAL | 1 refills | Status: DC

## 2024-02-02 MED ORDER — TRAMADOL HCL 50 MG PO TABS: 50.0000 mg | ORAL_TABLET | Freq: Four times a day (QID) | ORAL | 5 refills | Status: DC | PRN

## 2024-02-02 MED ORDER — ALLOPURINOL 100 MG PO TABS: 100.0000 mg | ORAL_TABLET | Freq: Every day | ORAL | 5 refills | Status: DC

## 2024-02-02 MED ORDER — TIZANIDINE HCL 4 MG PO TABS: 4.0000 mg | ORAL_TABLET | Freq: Two times a day (BID) | ORAL | 5 refills | Status: DC | PRN

## 2024-02-02 NOTE — Patient Instructions (Signed)
 Pt is a 58 yr old R handed male with hx of HTN who developed transverse myelitis d'xd in 7/21. With neurogenic bowel and bladder and spasticity-   S/P steroid IV and IVIG- no plasmapheresis. now on meds for BP.  Here for f/u on transverse myelitis/paraplegia. Also has spasticity of RLE and depression  Oral drug screen, per clinic policy.     2.  Will con't tramadol - up to  3 pills/day-  - up to every 6 hours- as needed- can take 2 pills at a time- if required.    3. Spasticity can temporarily can get worse when has inflammation/infection get worse- (has gout flare) is a Ambulance person for inflammation-  so it will tell you/get worse if there's any inflammation.    4. Con't Valium  2 mg 2x/day as needed # 60- with 5 refills.    5. Con't Tizanidine  4 mg up to 2x/day as needed for spasticity along with Valium - #60-  5 refills   6.   Thinks flares was sodas- which has cut off- was his Gout trigger- also avoids red meat and pork- cured meats-    7.  Cr is still high 1's- last was 1.96 in 7/24.  Since [facility]  reacted with Carvedilol , will not give it- will give Prednisone  20 mg x 5 days daily- with 1 refill.    8.  ONCE gout flare is COMPLETELY done, then can start Allopurinol  for prevention- 100 mg daily.  To help prevent gout flares.   9. F/U in 4 months-    10. no heat with gout- only ice.

## 2024-02-02 NOTE — Progress Notes (Signed)
 Subjective:    Patient ID: Maurice Cruz, male    DOB: 1966-01-15, 58 y.o.   MRN: [id number]  HPI Pt is a 58 yr old R handed male with hx of HTN who developed transverse myelitis d'xd in 7/21. With neurogenic bowel and bladder and spasticity-   S/P steroid IV and IVIG- no plasmapheresis. now on meds for BP.  Here for f/u on transverse myelitis/paraplegia. Also has spasticity of RLE and depression    Everything "OK" in [place], [place]- Going OK- pretty good.  Trying to get out- doing more  Doing more, doesn't make spasticity worse.   But has noticed last 2 weeks, spasticity a little worse.  Doesn't feel like has a UTI or any infection.    Still taking Valium  2 mg at bedtime.   Zanaflex -  4 mg at bedtime. As well for spasticity.    Also taking tramadol - 50 mg- every day- doesn't feel like 50 mg does as much as could.  Occ will takes 2 pills/day.  Normally takes 1 pill/bedtime.   Gout acting up pretty bad-  R foot- in big toe, but got worse in R ankle-  and very swollen/TTP.   Pain Inventory Average Pain 3 Pain Right Now 4 My pain is intermittent, constant, sharp, dull, and aching  In the last 24 hours, has pain interfered with the following? General activity 6 Relation with others 6 Enjoyment of life 7 What TIME of day is your pain at its worst? morning  Sleep (in general) Fair  Pain is worse with: bending, sitting, and inactivity Pain improves with: rest, therapy/exercise, and medication Relief from Meds:  Fair  Family History  Problem Relation Age of Onset   Cancer Maternal Grandmother    Social History   Socioeconomic History   Marital status: Single    Spouse name: Not on file   Number of children: 0   Years of education: HS   Highest education level: Not on file  Occupational History   Occupation: On disability  Tobacco Use   Smoking status: Never   Smokeless tobacco: Never  Vaping Use   Vaping status: Never Used  Substance and Sexual Activity    Alcohol use: Not Currently   Drug use: Never   Sexual activity: Not Currently  Other Topics Concern   Not on file  Social History Narrative   Right handed   1 glass tea per day   Coffee sometimes    No Soda   Lives with parents.   Social Drivers of Corporate investment banker Strain: Low Risk  (12/11/2022)   Received from [facility], [facility]   Overall Financial Resource Strain (CARDIA)    Difficulty of Paying Living Expenses: Not very hard  Food Insecurity: No Food Insecurity (12/11/2022)   Received from [facility], [facility]   Hunger Vital Sign    Worried About Running Out of Food in the Last Year: Never true    Ran Out of Food in the Last Year: Never true  Transportation Needs: No Transportation Needs (12/11/2022)   Received from [facility], [facility]   PRAPARE - Transportation    Lack of Transportation (Medical): No    Lack of Transportation (Non-Medical): No  Physical Activity: Unknown (12/11/2022)   Received from [facility], [facility]   Exercise Vital Sign    Days of Exercise per Week: 0 days    Minutes of Exercise per Session: Not on file  Stress: No Stress Concern Present (  12/11/2022)   Received from [facility], [facility]   [facility] - Occupational Stress Questionnaire    Feeling of Stress : Only a little  Social Connections: Somewhat Isolated (12/11/2022)   Received from [facility], [facility]   Social Network    How would you rate your social network (family, work, friends)?: Restricted participation with some degree of social isolation   Past Surgical History:  Procedure Laterality Date   Anorectal biospy  02/09/2021   Colon Biospy  02/09/2021   IR ANGIO INTRA EXTRACRAN SEL COM CAROTID INNOMINATE BILAT MOD SED  04/25/2021   IR ANGIO VERTEBRAL SEL SUBCLAVIAN INNOMINATE UNI L MOD SED  04/25/2021   IR ANGIO VERTEBRAL SEL VERTEBRAL UNI R MOD SED  04/25/2021   IR ANGIO/SPINAL LEFT  04/25/2021   IR  ANGIO/SPINAL LEFT  04/25/2021   IR ANGIO/SPINAL LEFT  04/25/2021   IR ANGIO/SPINAL LEFT  04/25/2021   IR ANGIO/SPINAL LEFT  04/25/2021   IR ANGIO/SPINAL LEFT  04/25/2021   IR ANGIO/SPINAL LEFT  04/25/2021   IR ANGIO/SPINAL LEFT  04/25/2021   IR ANGIO/SPINAL LEFT  04/25/2021   IR ANGIO/SPINAL LEFT  04/25/2021   IR ANGIO/SPINAL LEFT  04/25/2021   IR ANGIO/SPINAL LEFT  04/25/2021   IR ANGIO/SPINAL LEFT  04/25/2021   IR ANGIO/SPINAL RIGHT  04/25/2021   IR ANGIO/SPINAL RIGHT  04/25/2021   IR ANGIO/SPINAL RIGHT  04/25/2021   IR ANGIO/SPINAL RIGHT  04/25/2021   IR ANGIO/SPINAL RIGHT  04/25/2021   IR ANGIO/SPINAL RIGHT  04/25/2021   IR ANGIO/SPINAL RIGHT  04/25/2021   IR ANGIO/SPINAL RIGHT  04/25/2021   IR ANGIO/SPINAL RIGHT  04/25/2021   IR ANGIO/SPINAL RIGHT  04/25/2021   IR ANGIO/SPINAL RIGHT  04/25/2021   IR ANGIO/SPINAL RIGHT  04/25/2021   IR ANGIO/SPINAL RIGHT  04/25/2021   IR ANGIO/SPINAL RIGHT  04/25/2021   IR ANGIOGRAM EXTREMITY BILATERAL  04/25/2021   IR RADIOLOGIST EVAL & MGMT  03/21/2021   RADIOLOGY WITH ANESTHESIA N/A 04/25/2021   Procedure: IR WITH ANESTHESIA SPINAL ANGIOGRAM;  Surgeon: Luellen Sages, MD;  Location: [facility] OR;  Service: Radiology;  Laterality: N/A;   Past Surgical History:  Procedure Laterality Date   Anorectal biospy  02/09/2021   Colon Biospy  02/09/2021   IR ANGIO INTRA EXTRACRAN SEL COM CAROTID INNOMINATE BILAT MOD SED  04/25/2021   IR ANGIO VERTEBRAL SEL SUBCLAVIAN INNOMINATE UNI L MOD SED  04/25/2021   IR ANGIO VERTEBRAL SEL VERTEBRAL UNI R MOD SED  04/25/2021   IR ANGIO/SPINAL LEFT  04/25/2021   IR ANGIO/SPINAL LEFT  04/25/2021   IR ANGIO/SPINAL LEFT  04/25/2021   IR ANGIO/SPINAL LEFT  04/25/2021   IR ANGIO/SPINAL LEFT  04/25/2021   IR ANGIO/SPINAL LEFT  04/25/2021   IR ANGIO/SPINAL LEFT  04/25/2021   IR ANGIO/SPINAL LEFT  04/25/2021   IR ANGIO/SPINAL LEFT  04/25/2021   IR ANGIO/SPINAL LEFT  04/25/2021   IR ANGIO/SPINAL LEFT  04/25/2021   IR ANGIO/SPINAL LEFT  04/25/2021   IR  ANGIO/SPINAL LEFT  04/25/2021   IR ANGIO/SPINAL RIGHT  04/25/2021   IR ANGIO/SPINAL RIGHT  04/25/2021   IR ANGIO/SPINAL RIGHT  04/25/2021   IR ANGIO/SPINAL RIGHT  04/25/2021   IR ANGIO/SPINAL RIGHT  04/25/2021   IR ANGIO/SPINAL RIGHT  04/25/2021   IR ANGIO/SPINAL RIGHT  04/25/2021   IR ANGIO/SPINAL RIGHT  04/25/2021   IR ANGIO/SPINAL RIGHT  04/25/2021   IR ANGIO/SPINAL RIGHT  04/25/2021   IR ANGIO/SPINAL RIGHT  04/25/2021   IR ANGIO/SPINAL RIGHT  04/25/2021   IR ANGIO/SPINAL RIGHT  04/25/2021   IR ANGIO/SPINAL RIGHT  04/25/2021   IR ANGIOGRAM EXTREMITY BILATERAL  04/25/2021   IR RADIOLOGIST EVAL & MGMT  03/21/2021   RADIOLOGY WITH ANESTHESIA N/A 04/25/2021   Procedure: IR WITH ANESTHESIA SPINAL ANGIOGRAM;  Surgeon: Luellen Sages, MD;  Location: [facility];  Service: Radiology;  Laterality: N/A;   Past Medical History:  Diagnosis Date   Anxiety    Asthma    as a child   Depression    ED (erectile dysfunction)    GERD (gastroesophageal reflux disease)    Hx of spinal cord injury 03/2020   Hypertension    Neurogenic bladder    self caths   Neurogenic bowel    has to be digitially stimulated - 04/24/21   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HCC)    bilateral legs   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HCC)    There were no vitals taken for this visit.  Opioid Risk Score:   Fall Risk Score:  `1  Depression screen PHQ 2/9     05/26/2023    1:12 PM 05/24/2022    2:39 PM 02/04/2022    2:32 PM 10/26/2021    1:39 PM 12/08/2020   10:17 AM  Depression screen PHQ 2/9  Decreased Interest 0 1 0 0 3  Down, Depressed, Hopeless 0 1 0 0 3  PHQ - 2 Score 0 2 0 0 6  Altered sleeping     1  Tired, decreased energy     1  Change in appetite     2  Feeling bad or failure about yourself      3  Trouble concentrating     0  Moving slowly or fidgety/restless     0  Suicidal thoughts     0  PHQ-9 Score     13  Difficult doing work/chores     Very difficult    Review of Systems  Musculoskeletal:  Positive for back pain and gait problem.        Right foot pain  All other systems reviewed and are negative.     Objective:   Physical Exam   Awake,a lert, appropriate, using RW to walk, NAD Gout inflammation of R ankle and foot- very swollen with midl increase in heat Neuro: Increase in spasticity in RLE- MAS of 3 in R hip and knee- didn't check ankle due to gout      Assessment & Plan:   Pt is a 58 yr old R handed male with hx of HTN who developed transverse myelitis d'xd in 7/21. With neurogenic bowel and bladder and spasticity-   S/P steroid IV and IVIG- no plasmapheresis. now on meds for BP.  Here for f/u on transverse myelitis/paraplegia. Also has spasticity of RLE and depression  Oral drug screen, per clinic policy.     2.  Will con't tramadol - up to  3 pills/day-  - up to every 6 hours- as needed- can take 2 pills at a time- if required.    3. Spasticity can temporarily can get worse when has inflammation/infection get worse- (has gout flare) is a Ambulance person for inflammation-  so it will tell you/get worse if there's any inflammation.    4. Con't Valium  2 mg 2x/day as needed # 60- with 5 refills.    5. Con't Tizanidine  4 mg up to 2x/day as needed for spasticity along with Valium - #60-  5 refills   6.  Thinks flares was sodas- which has cut off- was his Gout trigger- also avoids red meat and pork- cured meats-    7.  Cr is still high 1's- last was 1.96 in 7/24.  Since Colchicine  reacted with Carvedilol , will not give it- will give Prednisone  20 mg x 5 days daily- with 1 refill.    8.  ONCE gout flare is COMPLETELY done, then can start Allopurinol  for prevention- 100 mg daily.  To help prevent gout flares.   9. F/U in 4 months-    10. no heat with gout- only ice.    I spent a total of 33   minutes on total care today- >50% coordination of care- due to  d/w pt about gout flare- reviewed labs and cannot use Colchicine - have ot use prednisone - also educated on diet- and spasticity increase.

## 2024-02-05 LAB — DRUG TOX MONITOR 1 W/CONF, ORAL FLD

## 2024-02-05 LAB — DRUG TOX ALC METAB W/CON, ORAL FLD: Alcohol Metabolite: NEGATIVE ng/mL (ref <25)

## 2024-02-27 ENCOUNTER — Other Ambulatory Visit: Payer: Self-pay | Admitting: Physical Medicine and Rehabilitation

## 2024-02-27 MED ORDER — ALLOPURINOL 100 MG PO TABS: 100.0000 mg | ORAL_TABLET | Freq: Every day | ORAL | 1 refills | Status: AC

## 2024-02-27 NOTE — Telephone Encounter (Signed)
 ERROR

## 2024-03-01 ENCOUNTER — Other Ambulatory Visit: Payer: Self-pay

## 2024-03-04 MED ORDER — TIZANIDINE HCL 4 MG PO TABS: 4.0000 mg | ORAL_TABLET | Freq: Two times a day (BID) | ORAL | 1 refills | Status: DC | PRN

## 2024-03-06 ENCOUNTER — Encounter: Payer: Self-pay | Admitting: Physical Medicine and Rehabilitation

## 2024-03-06 ENCOUNTER — Other Ambulatory Visit: Payer: Self-pay | Admitting: Physical Medicine and Rehabilitation

## 2024-03-26 ENCOUNTER — Encounter (HOSPITAL_COMMUNITY): Payer: Self-pay | Admitting: Interventional Radiology

## 2024-06-04 ENCOUNTER — Encounter: Admitting: Physical Medicine and Rehabilitation

## 2024-08-02 ENCOUNTER — Encounter: Payer: Self-pay | Admitting: Physical Medicine and Rehabilitation

## 2024-08-02 ENCOUNTER — Encounter: Attending: Physical Medicine and Rehabilitation | Admitting: Physical Medicine and Rehabilitation

## 2024-08-02 VITALS — BP 162/94 | HR 61 | Ht 70.0 in | Wt 178.4 lb

## 2024-08-02 DIAGNOSIS — R252 Cramp and spasm: Secondary | ICD-10-CM | POA: Diagnosis not present

## 2024-08-02 DIAGNOSIS — R3914 Feeling of incomplete bladder emptying: Secondary | ICD-10-CM | POA: Insufficient documentation

## 2024-08-02 DIAGNOSIS — N319 Neuromuscular dysfunction of bladder, unspecified: Secondary | ICD-10-CM | POA: Insufficient documentation

## 2024-08-02 DIAGNOSIS — G8220 Paraplegia, unspecified: Secondary | ICD-10-CM | POA: Insufficient documentation

## 2024-08-02 DIAGNOSIS — G894 Chronic pain syndrome: Secondary | ICD-10-CM | POA: Diagnosis present

## 2024-08-02 DIAGNOSIS — M7918 Myalgia, other site: Secondary | ICD-10-CM | POA: Insufficient documentation

## 2024-08-02 DIAGNOSIS — N401 Enlarged prostate with lower urinary tract symptoms: Secondary | ICD-10-CM | POA: Insufficient documentation

## 2024-08-02 DIAGNOSIS — R269 Unspecified abnormalities of gait and mobility: Secondary | ICD-10-CM | POA: Diagnosis present

## 2024-08-02 MED ORDER — DIAZEPAM 2 MG PO TABS: 2.0000 mg | ORAL_TABLET | Freq: Two times a day (BID) | ORAL | 1 refills | Status: AC | PRN

## 2024-08-02 MED ORDER — TIZANIDINE HCL 4 MG PO TABS: 4.0000 mg | ORAL_TABLET | Freq: Two times a day (BID) | ORAL | 1 refills | Status: AC | PRN

## 2024-08-02 MED ORDER — TRAMADOL HCL 50 MG PO TABS: 50.0000 mg | ORAL_TABLET | Freq: Four times a day (QID) | ORAL | 1 refills | Status: AC | PRN

## 2024-08-02 NOTE — Patient Instructions (Signed)
 Pt is a 58 yr old R handed male with hx of HTN who developed transverse myelitis d'xd in 7/21. With neurogenic bowel and bladder and spasticity-   S/P steroid IV and IVIG- no plasmapheresis. now on meds for BP.  Here for f/u on transverse myelitis/paraplegia. Also has spasticity of RLE and depression  Dr Therisa Bowling- in [place]- see if can f/u with her.    2.  Need to become Power of [place] on family.   3.  Oral drug screen  last done 02/02/24- not due today.   4. Con't Tramadol -  refill for 6 months  5. Did myofascial release on B/L scalenes- middle scalene- youtube has some great videos on Theracane- pecs; the upper traps, levators and middle scalenes.   I bet you're getting so tight due to RW.   6.   Theracane- can get from [facility]- the goal is pressure only- not massage- a minimum of 2 minutes on each spot- can add more pressure  if not releasing-    7.   Can always do trigger point injections- like acupuncture- if it comes back- or is a problem. Just call me if you decide you want them   8.  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 Pt uses 5 coude tip catheters/day- due to enlarged prostate- BPH which causes  the catheter to sometimes cause trauma in urethra- therefore, due to BPH,pt NEEDS Coude' catheter- 14 french Uses 5 catheters/day- so 150 caths/month- for 1 year-    Cannot afford Coude' tips- needing to use straight catheters- however pt really needs coude'- will see if we can get him more Coude' catheters to be covered by insurance.   9. Con't Zanaflex  4 mg 2x/day for Spasticity   10. Con't Valium  2 mg 2x/day as needed for spasticity along with Zanaflex - needs both due to Hypotension if increases Zanaflex  higher.   11.  Pt does have improved ROM of neck somewhat after myofascial release- but not completely perfect quite yet, of note.   12. F/U in 6 months- double  appt- SCI

## 2024-08-02 NOTE — Progress Notes (Addendum)
 Subjective:    Patient ID: Maurice Cruz, male    DOB: 09-09-1966, 58 y.o.   MRN: [id number]  HPI   Pt is a 58 yr old R handed male with hx of HTN who developed transverse myelitis d'xd in 7/21. With neurogenic bowel and bladder and spasticity-   S/P steroid IV and IVIG- no plasmapheresis. now on meds for BP.  Here for f/u on transverse myelitis/paraplegia. Also has spasticity of RLE and depression   Very bad abd pain- in 5/25- some kind of infection.   Everything about the same.   Still down in [place]. [place]-  Family issues so has been coming back up more-  Just him and his brother to help his parents.  Mother didn't want to be here anymore- got Admission  to psych hospital. Kept for 7 days. MRI of her body- can only find  things causing aches and pains.   Pt thinks she's severely depressed and was on verge of nervous breakdown.    Spasticity about the same-   Gout- hasn't acted up in awhile- was drinking Ginger ale, and realized cannot drink soda.    BP 162/94-  And took tylenol - for crick in his neck- easing up some        Pain Inventory Average Pain 4 Pain Right Now 3 My pain is sharp, dull, and aching  In the last 24 hours, has pain interfered with the following? General activity 3 Relation with others 3 Enjoyment of life 3 What TIME of day is your pain at its worst? morning  and daytime Sleep (in general) Fair  Pain is worse with: walking, bending, inactivity, standing, and some activites Pain improves with: rest and medication Relief from Meds: 4  Family History  Problem Relation Age of Onset   Cancer Maternal Grandmother    Social History   Socioeconomic History   Marital status: Single    Spouse name: Not on file   Number of children: 0   Years of education: HS   Highest education level: Not on file  Occupational History   Occupation: On disability  Tobacco Use   Smoking status: Never   Smokeless tobacco: Never  Vaping Use   Vaping  status: Never Used  Substance and Sexual Activity   Alcohol use: Not Currently   Drug use: Never   Sexual activity: Not Currently  Other Topics Concern   Not on file  Social History Narrative   Right handed   1 glass tea per day   Coffee sometimes    No Soda   Lives with parents.   Social Drivers of Corporate Investment Banker Strain: Low Risk  (12/11/2022)   Received from [facility]   Overall Financial Resource Strain (CARDIA)    Difficulty of Paying Living Expenses: Not very hard  Food Insecurity: No Food Insecurity (12/11/2022)   Received from [facility]   Hunger Vital Sign    Within the past 12 months, you worried that your food would run out before you got the money to buy more.: Never true    Within the past 12 months, the food you bought just didn't last and you didn't have money to get more.: Never true  Transportation Needs: No Transportation Needs (12/11/2022)   Received from [facility] - Transportation    Lack of Transportation (Medical): No    Lack of Transportation (Non-Medical): No  Physical Activity: Unknown (12/11/2022)   Received from [facility]  Exercise Vital Sign    On average, how many days per week do you engage in moderate to strenuous exercise (like a brisk walk)?: 0 days    Minutes of Exercise per Session: Not on file  Stress: No Stress Concern Present (12/11/2022)   Received from [facility] - Occupational Stress Questionnaire    Feeling of Stress : Only a little  Social Connections: Somewhat Isolated (12/11/2022)   Received from [facility]   Social Network    How would you rate your social network (family, work, friends)?: Restricted participation with some degree of social isolation   Past Surgical History:  Procedure Laterality Date   Anorectal biospy  02/09/2021   Colon Biospy  02/09/2021   IR ANGIO INTRA EXTRACRAN SEL COM CAROTID INNOMINATE BILAT MOD SED  04/25/2021   IR  ANGIO VERTEBRAL SEL SUBCLAVIAN INNOMINATE UNI L MOD SED  04/25/2021   IR ANGIO VERTEBRAL SEL VERTEBRAL UNI R MOD SED  04/25/2021   IR ANGIO/SPINAL LEFT  04/25/2021   IR ANGIO/SPINAL LEFT  04/25/2021   IR ANGIO/SPINAL LEFT  04/25/2021   IR ANGIO/SPINAL LEFT  04/25/2021   IR ANGIO/SPINAL LEFT  04/25/2021   IR ANGIO/SPINAL LEFT  04/25/2021   IR ANGIO/SPINAL LEFT  04/25/2021   IR ANGIO/SPINAL LEFT  04/25/2021   IR ANGIO/SPINAL LEFT  04/25/2021   IR ANGIO/SPINAL LEFT  04/25/2021   IR ANGIO/SPINAL LEFT  04/25/2021   IR ANGIO/SPINAL LEFT  04/25/2021   IR ANGIO/SPINAL LEFT  04/25/2021   IR ANGIO/SPINAL RIGHT  04/25/2021   IR ANGIO/SPINAL RIGHT  04/25/2021   IR ANGIO/SPINAL RIGHT  04/25/2021   IR ANGIO/SPINAL RIGHT  04/25/2021   IR ANGIO/SPINAL RIGHT  04/25/2021   IR ANGIO/SPINAL RIGHT  04/25/2021   IR ANGIO/SPINAL RIGHT  04/25/2021   IR ANGIO/SPINAL RIGHT  04/25/2021   IR ANGIO/SPINAL RIGHT  04/25/2021   IR ANGIO/SPINAL RIGHT  04/25/2021   IR ANGIO/SPINAL RIGHT  04/25/2021   IR ANGIO/SPINAL RIGHT  04/25/2021   IR ANGIO/SPINAL RIGHT  04/25/2021   IR ANGIO/SPINAL RIGHT  04/25/2021   IR ANGIOGRAM EXTREMITY BILATERAL  04/25/2021   IR RADIOLOGIST EVAL & MGMT  03/21/2021   RADIOLOGY WITH ANESTHESIA N/A 04/25/2021   Procedure: IR WITH ANESTHESIA SPINAL ANGIOGRAM;  Surgeon: Dolphus Carrion, MD;  Location: [facility] OR;  Service: Radiology;  Laterality: N/A;   Past Surgical History:  Procedure Laterality Date   Anorectal biospy  02/09/2021   Colon Biospy  02/09/2021   IR ANGIO INTRA EXTRACRAN SEL COM CAROTID INNOMINATE BILAT MOD SED  04/25/2021   IR ANGIO VERTEBRAL SEL SUBCLAVIAN INNOMINATE UNI L MOD SED  04/25/2021   IR ANGIO VERTEBRAL SEL VERTEBRAL UNI R MOD SED  04/25/2021   IR ANGIO/SPINAL LEFT  04/25/2021   IR ANGIO/SPINAL LEFT  04/25/2021   IR ANGIO/SPINAL LEFT  04/25/2021   IR ANGIO/SPINAL LEFT  04/25/2021   IR ANGIO/SPINAL LEFT  04/25/2021   IR ANGIO/SPINAL LEFT  04/25/2021   IR ANGIO/SPINAL LEFT  04/25/2021   IR  ANGIO/SPINAL LEFT  04/25/2021   IR ANGIO/SPINAL LEFT  04/25/2021   IR ANGIO/SPINAL LEFT  04/25/2021   IR ANGIO/SPINAL LEFT  04/25/2021   IR ANGIO/SPINAL LEFT  04/25/2021   IR ANGIO/SPINAL LEFT  04/25/2021   IR ANGIO/SPINAL RIGHT  04/25/2021   IR ANGIO/SPINAL RIGHT  04/25/2021   IR ANGIO/SPINAL RIGHT  04/25/2021   IR ANGIO/SPINAL RIGHT  04/25/2021   IR ANGIO/SPINAL RIGHT  04/25/2021  IR ANGIO/SPINAL RIGHT  04/25/2021   IR ANGIO/SPINAL RIGHT  04/25/2021   IR ANGIO/SPINAL RIGHT  04/25/2021   IR ANGIO/SPINAL RIGHT  04/25/2021   IR ANGIO/SPINAL RIGHT  04/25/2021   IR ANGIO/SPINAL RIGHT  04/25/2021   IR ANGIO/SPINAL RIGHT  04/25/2021   IR ANGIO/SPINAL RIGHT  04/25/2021   IR ANGIO/SPINAL RIGHT  04/25/2021   IR ANGIOGRAM EXTREMITY BILATERAL  04/25/2021   IR RADIOLOGIST EVAL & MGMT  03/21/2021   RADIOLOGY WITH ANESTHESIA N/A 04/25/2021   Procedure: IR WITH ANESTHESIA SPINAL ANGIOGRAM;  Surgeon: Dolphus Carrion, MD;  Location: [facility];  Service: Radiology;  Laterality: N/A;   Past Medical History:  Diagnosis Date   Anxiety    Asthma    as a child   Depression    ED (erectile dysfunction)    GERD (gastroesophageal reflux disease)    Hx of spinal cord injury 03/2020   Hypertension    Neurogenic bladder    self caths   Neurogenic bowel    has to be digitially stimulated - 04/24/21   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HCC)    bilateral legs   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HCC)    BP (!) 162/94   Pulse 61   Ht 5' 10 (1.778 m)   Wt 178 lb 6.4 oz (80.9 kg)   SpO2 94%   BMI 25.60 kg/m   Opioid Risk Score:   Fall Risk Score:  `1  Depression screen PHQ 2/9     08/02/2024    2:10 PM 02/02/2024    9:49 AM 05/26/2023    1:12 PM 05/24/2022    2:39 PM 02/04/2022    2:32 PM 10/26/2021    1:39 PM 12/08/2020   10:17 AM  Depression screen PHQ 2/9  Decreased Interest 0 1 0 1 0 0 3  Down, Depressed, Hopeless 0 1 0 1 0 0 3  PHQ - 2 Score 0 2 0 2 0 0 6  Altered sleeping       1  Tired, decreased energy       1  Change in appetite       2   Feeling bad or failure about yourself        3  Trouble concentrating       0  Moving slowly or fidgety/restless       0  Suicidal thoughts       0  PHQ-9 Score       13  Difficult doing work/chores       Very difficult     Review of Systems  Musculoskeletal:  Positive for back pain and gait problem.  All other systems reviewed and are negative.      Objective:   Physical Exam        Assessment & Plan:   Pt is a 58 yr old R handed male with hx of HTN who developed transverse myelitis d'xd in 7/21. With neurogenic bowel and bladder and spasticity-   S/P steroid IV and IVIG- no plasmapheresis. now on meds for BP.  Here for f/u on transverse myelitis/paraplegia. Also has spasticity of RLE and depression  Dr Therisa Bowling- in [place]- see if can f/u with her.    2.  Need to become Power of [place] on family.   3.  Oral drug screen  last done 02/02/24- not due today.   4. Con't Tramadol -  refill for 6 months  5. Did myofascial release on B/L scalenes- middle scalene- youtube has some great videos on  Theracane- pecs; the upper traps, levators and middle scalenes.   I bet you're getting so tight due to RW.   6.   Theracane- can get from [facility]- the goal is pressure only- not massage- a minimum of 2 minutes on each spot- can add more pressure  if not releasing-    7.   Can always do trigger point injections- like acupuncture- if it comes back- or is a problem. Just call me if you decide you want them   8.  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 Pt uses 5 coude tip catheters/day- due to enlarged prostate- BPH which causes  the catheter to sometimes cause trauma in urethra- therefore, due to BPH,pt NEEDS Coude' catheter- 14 french Uses 5 catheters/day- so 150 caths/month- for 1 year- Patient needs due to increased risk  of infection, caused by his SCI.   Cannot afford Coude' tips- needing to use straight catheters- however pt really needs coude'- will see if we can get him more Coude' catheters to be covered by insurance.   9. Con't Zanaflex  4 mg 2x/day for Spasticity   10. Con't Valium  2 mg 2x/day as needed for spasticity along with Zanaflex - needs both due to Hypotension if increases Zanaflex  higher.   11.  Pt does have improved ROM of neck somewhat after myofascial release- but not completely perfect quite yet, of note.   12. F/U in 6 months- double appt- SCI    I spent a total of 41   minutes on total care today- >50% coordination of care- due to d/w pt about bladder, spasticity; as coude tips- as well as myofascial release- which took 8 minutes itself- d/w pt about PM&R in [place], and refilled meds- and his mother' situation

## 2024-08-04 ENCOUNTER — Encounter: Payer: Self-pay | Admitting: Physical Medicine and Rehabilitation

## 2024-09-03 ENCOUNTER — Telehealth: Payer: Self-pay | Admitting: Physical Medicine and Rehabilitation

## 2024-09-03 NOTE — Telephone Encounter (Signed)
 Pt states that 180 medical needs prescription and a medical note for his next shipment of catheter

## 2024-09-09 NOTE — Telephone Encounter (Signed)
 LVM for him to call the office and clarify if his catheter situation has resolved or if not clarify the name of the company he gets his catheters from.

## 2025-01-31 ENCOUNTER — Encounter: Admitting: Physical Medicine and Rehabilitation
